# Patient Record
Sex: Male | Born: 1965 | Race: Black or African American | Hispanic: No | Marital: Single | Smoking: Current every day smoker
Health system: Southern US, Community
[De-identification: ages and names within clinical notes are randomized; demographics above are authoritative.]

## PROBLEM LIST (undated history)

## (undated) DIAGNOSIS — D649 Anemia, unspecified: Secondary | ICD-10-CM

## (undated) DIAGNOSIS — J942 Hemothorax: Secondary | ICD-10-CM

## (undated) DIAGNOSIS — K8020 Calculus of gallbladder without cholecystitis without obstruction: Secondary | ICD-10-CM

## (undated) DIAGNOSIS — IMO0002 Reserved for concepts with insufficient information to code with codable children: Secondary | ICD-10-CM

---

## 1998-02-26 ENCOUNTER — Emergency Department (HOSPITAL_COMMUNITY): Admission: EM | Admit: 1998-02-26 | Discharge: 1998-02-26 | Payer: Self-pay | Admitting: Emergency Medicine

## 2003-01-15 ENCOUNTER — Emergency Department (HOSPITAL_COMMUNITY): Admission: EM | Admit: 2003-01-15 | Discharge: 2003-01-15 | Payer: Self-pay | Admitting: Emergency Medicine

## 2004-06-19 ENCOUNTER — Emergency Department (HOSPITAL_COMMUNITY): Admission: EM | Admit: 2004-06-19 | Discharge: 2004-06-19 | Payer: Self-pay | Admitting: Emergency Medicine

## 2005-10-22 ENCOUNTER — Emergency Department (HOSPITAL_COMMUNITY): Admission: EM | Admit: 2005-10-22 | Discharge: 2005-10-22 | Payer: Self-pay | Admitting: Emergency Medicine

## 2006-04-14 ENCOUNTER — Emergency Department (HOSPITAL_COMMUNITY): Admission: EM | Admit: 2006-04-14 | Discharge: 2006-04-14 | Payer: Self-pay | Admitting: Emergency Medicine

## 2006-09-30 ENCOUNTER — Emergency Department (HOSPITAL_COMMUNITY): Admission: EM | Admit: 2006-09-30 | Discharge: 2006-09-30 | Payer: Self-pay | Admitting: Emergency Medicine

## 2007-05-08 ENCOUNTER — Emergency Department (HOSPITAL_COMMUNITY): Admission: EM | Admit: 2007-05-08 | Discharge: 2007-05-09 | Payer: Self-pay | Admitting: Emergency Medicine

## 2007-05-29 ENCOUNTER — Emergency Department (HOSPITAL_COMMUNITY): Admission: EM | Admit: 2007-05-29 | Discharge: 2007-05-29 | Payer: Self-pay | Admitting: Emergency Medicine

## 2008-05-19 ENCOUNTER — Emergency Department (HOSPITAL_COMMUNITY): Admission: EM | Admit: 2008-05-19 | Discharge: 2008-05-19 | Payer: Self-pay | Admitting: Emergency Medicine

## 2008-11-14 ENCOUNTER — Emergency Department (HOSPITAL_COMMUNITY): Admission: EM | Admit: 2008-11-14 | Discharge: 2008-11-14 | Payer: Self-pay | Admitting: Emergency Medicine

## 2008-11-16 ENCOUNTER — Ambulatory Visit (HOSPITAL_COMMUNITY): Admission: RE | Admit: 2008-11-16 | Discharge: 2008-11-16 | Payer: Self-pay | Admitting: Emergency Medicine

## 2009-06-20 ENCOUNTER — Emergency Department (HOSPITAL_COMMUNITY): Admission: EM | Admit: 2009-06-20 | Discharge: 2009-06-20 | Payer: Self-pay | Admitting: Emergency Medicine

## 2010-06-16 LAB — DIFFERENTIAL
Eosinophils Absolute: 0 10*3/uL (ref 0.0–0.7)
Eosinophils Relative: 1 % (ref 0–5)
Lymphocytes Relative: 18 % (ref 12–46)
Lymphs Abs: 1.4 10*3/uL (ref 0.7–4.0)
Monocytes Relative: 5 % (ref 3–12)
Neutrophils Relative %: 75 % (ref 43–77)

## 2010-06-16 LAB — COMPREHENSIVE METABOLIC PANEL
ALT: 8 U/L (ref 0–53)
AST: 17 U/L (ref 0–37)
Calcium: 9.6 mg/dL (ref 8.4–10.5)
Creatinine, Ser: 1.02 mg/dL (ref 0.4–1.5)
GFR calc Af Amer: >60 mL/min (ref >60)
Sodium: 137 mEq/L (ref 135–145)
Total Protein: 7.1 g/dL (ref 6.0–8.3)

## 2010-06-16 LAB — URINE MICROSCOPIC-ADD ON

## 2010-06-16 LAB — URINALYSIS, ROUTINE W REFLEX MICROSCOPIC
Bilirubin Urine: NEGATIVE
Ketones, ur: NEGATIVE mg/dL
Nitrite: NEGATIVE
Urobilinogen, UA: 1 mg/dL (ref 0.0–1.0)
pH: 5.5 (ref 5.0–8.0)

## 2010-06-16 LAB — CBC
MCHC: 32 g/dL (ref 30.0–36.0)
RDW: 12.2 % (ref 11.5–15.5)

## 2010-06-16 LAB — POCT CARDIAC MARKERS
CKMB, poc: <1 ng/mL — ABNORMAL LOW (ref 1.0–8.0)
Troponin i, poc: <0.05 ng/mL (ref 0.00–0.09)

## 2010-06-29 LAB — DIFFERENTIAL
Basophils Absolute: 0 10*3/uL (ref 0.0–0.1)
Eosinophils Relative: 0 % (ref 0–5)
Lymphocytes Relative: 19 % (ref 12–46)
Lymphs Abs: 1.4 10*3/uL (ref 0.7–4.0)
Neutro Abs: 5.4 10*3/uL (ref 1.7–7.7)
Neutrophils Relative %: 73 % (ref 43–77)

## 2010-06-29 LAB — CBC
HCT: 44.6 % (ref 39.0–52.0)
Hemoglobin: 14.5 g/dL (ref 13.0–17.0)
MCHC: 32.6 g/dL (ref 30.0–36.0)
MCV: 81.3 fL (ref 78.0–100.0)
RBC: 5.49 MIL/uL (ref 4.22–5.81)
WBC: 7.3 10*3/uL (ref 4.0–10.5)

## 2010-06-29 LAB — COMPREHENSIVE METABOLIC PANEL
AST: 18 U/L (ref 0–37)
BUN: 17 mg/dL (ref 6–23)
CO2: 30 mEq/L (ref 19–32)
Calcium: 9.8 mg/dL (ref 8.4–10.5)
Chloride: 101 mEq/L (ref 96–112)
Creatinine, Ser: 1.16 mg/dL (ref 0.4–1.5)
GFR calc Af Amer: >60 mL/min (ref >60)
GFR calc non Af Amer: >60 mL/min (ref >60)
Glucose, Bld: 85 mg/dL (ref 70–99)
Total Bilirubin: 0.6 mg/dL (ref 0.3–1.2)

## 2010-06-29 LAB — LIPASE, BLOOD: Lipase: 19 U/L (ref 11–59)

## 2010-07-09 LAB — CBC
HCT: 38 % — ABNORMAL LOW (ref 39.0–52.0)
Hemoglobin: 12.4 g/dL — ABNORMAL LOW (ref 13.0–17.0)
Platelets: 206 10*3/uL (ref 150–400)
RDW: 13.4 % (ref 11.5–15.5)
WBC: 8.1 10*3/uL (ref 4.0–10.5)

## 2010-07-09 LAB — COMPREHENSIVE METABOLIC PANEL
ALT: 8 U/L (ref 0–53)
Albumin: 3.6 g/dL (ref 3.5–5.2)
Alkaline Phosphatase: 79 U/L (ref 39–117)
Chloride: 104 mEq/L (ref 96–112)
Glucose, Bld: 101 mg/dL — ABNORMAL HIGH (ref 70–99)
Potassium: 4.1 mEq/L (ref 3.5–5.1)
Sodium: 137 mEq/L (ref 135–145)
Total Bilirubin: 0.4 mg/dL (ref 0.3–1.2)
Total Protein: 6.3 g/dL (ref 6.0–8.3)

## 2010-12-16 LAB — CBC
HCT: 45.8
Hemoglobin: 14.9
MCHC: 32.4
Platelets: 248
RDW: 13.3

## 2010-12-16 LAB — COMPREHENSIVE METABOLIC PANEL
BUN: 14
Calcium: 9.6
Creatinine, Ser: 1.15
Glucose, Bld: 84
Total Protein: 7.9

## 2010-12-16 LAB — DIFFERENTIAL
Basophils Relative: 0
Lymphocytes Relative: 19
Lymphs Abs: 1.3
Monocytes Relative: 6
Neutro Abs: 5.1
Neutrophils Relative %: 74

## 2011-01-07 LAB — I-STAT 8, (EC8 V) (CONVERTED LAB)
Bicarbonate: 26.2 — ABNORMAL HIGH
HCT: 42
Hemoglobin: 14.3
Operator id: 189501
TCO2: 28
pCO2, Ven: 47.7

## 2011-01-07 LAB — POCT CARDIAC MARKERS
CKMB, poc: <1 — ABNORMAL LOW
Troponin i, poc: <0.05

## 2011-01-07 LAB — DIFFERENTIAL
Lymphocytes Relative: 33
Monocytes Absolute: 0.4
Monocytes Relative: 7
Neutro Abs: 3.6

## 2011-01-07 LAB — CBC
HCT: 37.5 — ABNORMAL LOW
Hemoglobin: 12.1 — ABNORMAL LOW
MCV: 80.2
RBC: 4.68
WBC: 6.2

## 2011-01-07 LAB — POCT I-STAT CREATININE
Creatinine, Ser: 1
Operator id: 189501

## 2011-02-04 ENCOUNTER — Emergency Department (HOSPITAL_COMMUNITY)
Admission: EM | Admit: 2011-02-04 | Discharge: 2011-02-04 | Disposition: A | Discharge status: Home / Self Care | Payer: Self-pay | Attending: Emergency Medicine | Admitting: Emergency Medicine

## 2011-02-04 ENCOUNTER — Emergency Department (HOSPITAL_COMMUNITY): Payer: Self-pay

## 2011-02-04 DIAGNOSIS — F17200 Nicotine dependence, unspecified, uncomplicated: Secondary | ICD-10-CM | POA: Insufficient documentation

## 2011-02-04 DIAGNOSIS — K2960 Other gastritis without bleeding: Secondary | ICD-10-CM | POA: Insufficient documentation

## 2011-02-04 DIAGNOSIS — R1013 Epigastric pain: Secondary | ICD-10-CM | POA: Insufficient documentation

## 2011-02-04 DIAGNOSIS — T3995XA Adverse effect of unspecified nonopioid analgesic, antipyretic and antirheumatic, initial encounter: Secondary | ICD-10-CM | POA: Insufficient documentation

## 2011-02-04 HISTORY — DX: Hemothorax: J94.2

## 2011-02-04 HISTORY — DX: Calculus of gallbladder without cholecystitis without obstruction: K80.20

## 2011-02-04 HISTORY — DX: Reserved for concepts with insufficient information to code with codable children: IMO0002

## 2011-02-04 LAB — COMPREHENSIVE METABOLIC PANEL
Albumin: 3.3 g/dL — ABNORMAL LOW (ref 3.5–5.2)
Alkaline Phosphatase: 93 U/L (ref 39–117)
BUN: 12 mg/dL (ref 6–23)
Potassium: 3.9 mEq/L (ref 3.5–5.1)
Total Protein: 6.6 g/dL (ref 6.0–8.3)

## 2011-02-04 LAB — CBC
Hemoglobin: 11.6 g/dL — ABNORMAL LOW (ref 13.0–17.0)
MCH: 25.4 pg — ABNORMAL LOW (ref 26.0–34.0)
MCHC: 32.9 g/dL (ref 30.0–36.0)
Platelets: 227 10*3/uL (ref 150–400)
RDW: 12.7 % (ref 11.5–15.5)

## 2011-02-04 LAB — DIFFERENTIAL
Basophils Relative: 0 % (ref 0–1)
Eosinophils Absolute: 0 10*3/uL (ref 0.0–0.7)
Monocytes Relative: 6 % (ref 3–12)
Neutrophils Relative %: 65 % (ref 43–77)

## 2011-02-04 LAB — LIPASE, BLOOD: Lipase: 23 U/L (ref 11–59)

## 2011-02-04 MED ORDER — SODIUM CHLORIDE 0.9 % IV BOLUS (SEPSIS)
500.0000 mL | Freq: Once | INTRAVENOUS | Status: AC
  Administered 2011-02-04: 500 mL via INTRAVENOUS

## 2011-02-04 MED ORDER — ONDANSETRON HCL 4 MG/2ML IJ SOLN
INTRAMUSCULAR | Status: AC
  Filled 2011-02-04: qty 2

## 2011-02-04 MED ORDER — ONDANSETRON HCL 4 MG/2ML IJ SOLN
4.0000 mg | Freq: Once | INTRAMUSCULAR | Status: AC
  Administered 2011-02-04: 4 mg via INTRAVENOUS

## 2011-02-04 MED ORDER — FENTANYL CITRATE 0.05 MG/ML IJ SOLN
50.0000 ug | Freq: Once | INTRAMUSCULAR | Status: AC
  Administered 2011-02-04: 50 ug via INTRAVENOUS
  Filled 2011-02-04: qty 2

## 2011-02-04 NOTE — ED Notes (Signed)
Pt c/o abd pain that began two weeks ago

## 2011-02-04 NOTE — ED Provider Notes (Signed)
Ultrasound is negative for gall stones. The patient has been taking ibuprofen regularly and pain seems to be localized to epigastric area, suggesting gastris. Will put patient on Prilosec and refer to primary care provider for further management.  Rodena Medin, PA 02/04/11 [phone]

## 2011-02-04 NOTE — ED Provider Notes (Signed)
History     CSN: [id number] Arrival date & time: 02/04/2011  1:14 PM   First MD Initiated Contact with Patient 02/04/11 1316      Chief Complaint  Patient presents with  . Abdominal Pain    (Consider location/radiation/quality/duration/timing/severity/associated sxs/prior treatment) HPI  Patient states he has a history of being told in the past and has gallstones but has not followed up with a general surgeon and he does not have a primary care doctor denying any known medical problems presents to the emergency department complaining of a two-week history of abdominal pain. Patient states the abdominal pain is in the upper epigastric region and it is greatest in the evening time after eating with pain responsive to taking ibuprofen. Patient states he takes "a lot of ibuprofen." Patient states sometimes he is able to go to sleep but then the pain will wake him from sleeping. Patient states he has pain most days out of the week with the same ongoing pain for 2 weeks. As fevers, chills, nausea, vomiting, diarrhea, chest pain, shortness of breath. Pain is improved with ibuprofen use he aggravated by eating. However patient denies any changes in his appetite stating he is eating and drinking without difficulty. Denies blood in his stool.  Past Medical History  Diagnosis Date  . Ulcer   . Gallstones   . Hemothorax     History reviewed. No pertinent past surgical history.  History reviewed. No pertinent family history.  History  Substance Use Topics  . Smoking status: Current Everyday Smoker  . Smokeless tobacco: Not on file  . Alcohol Use: No      Review of Systems  All other systems reviewed and are negative.    Allergies  Review of patient's allergies indicates no known allergies.  Home Medications   Current Outpatient Rx  Name Route Sig Dispense Refill  . ACETAMINOPHEN 500 MG PO TABS Oral Take 1,000 mg by mouth every 6 (six) hours as needed. Pain/ fever     .  IBUPROFEN 200 MG PO TABS Oral Take 800 mg by mouth every 6 (six) hours as needed. Pain/fever        BP 119/77  Pulse 61  Temp(Src) 98.7 F (37.1 C) (Oral)  Resp 17  SpO2 98%  Physical Exam  Nursing note and vitals reviewed. Constitutional: He is oriented to person, place, and time. He appears well-developed and well-nourished. No distress.  HENT:  Head: Normocephalic and atraumatic.  Eyes: Conjunctivae are normal.  Neck: Normal range of motion. Neck supple.  Cardiovascular: Normal rate, regular rhythm, normal heart sounds and intact distal pulses.  Exam reveals no gallop and no friction rub.   No murmur heard. Pulmonary/Chest: Effort normal and breath sounds normal. No respiratory distress. He has no wheezes. He has no rales. He exhibits no tenderness.  Abdominal: Soft. Normal appearance, normal aorta and bowel sounds are normal. He exhibits no distension and no mass. There is tenderness in the epigastric area. There is no rebound, no guarding and no CVA tenderness.  Musculoskeletal: Normal range of motion. He exhibits no edema and no tenderness.  Neurological: He is alert and oriented to person, place, and time.  Skin: Skin is warm and dry. No rash noted. He is not diaphoretic. No erythema.  Psychiatric: He has a normal mood and affect.    ED Course  Procedures (including critical care time)  IV fentanyl and zofran   Labs Reviewed  CBC - Abnormal; Notable for the following:  Hemoglobin 11.6 (*)    HCT 35.3 (*)    MCV 77.2 (*)    MCH 25.4 (*)    All other components within normal limits  COMPREHENSIVE METABOLIC PANEL - Abnormal; Notable for the following:    Albumin 3.3 (*)    All other components within normal limits  DIFFERENTIAL  LIPASE, BLOOD   No results found.   No diagnosis found.    MDM  Sign out given to [facility] for pending abdominal US with patient non toxic appearing, afebrile, and no peritoneal signs. If [place] is negative then patient to be  educated on decreasing his ibuprofen use and started on a PPI with PCP follow up.         Jenness Corner, [place] 02/04/11 1523

## 2011-02-04 NOTE — ED Notes (Signed)
Pt has arrived in cdu. He has been flagged for discharge home by pa.

## 2011-02-04 NOTE — ED Provider Notes (Signed)
Medical screening examination/treatment/procedure(s) were performed by non-physician practitioner and as supervising physician I was immediately available for consultation/collaboration.    Nelia Shi, MD 02/04/11 [phone]

## 2011-02-05 NOTE — ED Provider Notes (Signed)
Evaluation and management procedures were performed by the PA/NP under my supervision/collaboration.    , MD 02/05/11 0032 

## 2011-07-15 ENCOUNTER — Encounter (HOSPITAL_COMMUNITY): Payer: Self-pay | Admitting: Emergency Medicine

## 2011-07-15 ENCOUNTER — Emergency Department (HOSPITAL_COMMUNITY)
Admission: EM | Admit: 2011-07-15 | Discharge: 2011-07-16 | Disposition: A | Discharge status: Home / Self Care | Payer: Self-pay | Attending: Emergency Medicine | Admitting: Emergency Medicine

## 2011-07-15 DIAGNOSIS — K921 Melena: Secondary | ICD-10-CM | POA: Insufficient documentation

## 2011-07-15 DIAGNOSIS — R748 Abnormal levels of other serum enzymes: Secondary | ICD-10-CM | POA: Insufficient documentation

## 2011-07-15 DIAGNOSIS — R109 Unspecified abdominal pain: Secondary | ICD-10-CM | POA: Insufficient documentation

## 2011-07-15 LAB — COMPREHENSIVE METABOLIC PANEL
Alkaline Phosphatase: 94 U/L (ref 39–117)
BUN: 16 mg/dL (ref 6–23)
CO2: 24 mEq/L (ref 19–32)
Chloride: 97 mEq/L (ref 96–112)
GFR calc Af Amer: >90 mL/min (ref >90)
Glucose, Bld: 87 mg/dL (ref 70–99)
Potassium: 3.7 mEq/L (ref 3.5–5.1)
Total Bilirubin: 0.5 mg/dL (ref 0.3–1.2)

## 2011-07-15 LAB — LIPASE, BLOOD: Lipase: 66 U/L — ABNORMAL HIGH (ref 11–59)

## 2011-07-15 LAB — CBC
HCT: 37.8 % — ABNORMAL LOW (ref 39.0–52.0)
Hemoglobin: 12.3 g/dL — ABNORMAL LOW (ref 13.0–17.0)
MCHC: 32.5 g/dL (ref 30.0–36.0)
WBC: 7.6 10*3/uL (ref 4.0–10.5)

## 2011-07-15 MED ORDER — ONDANSETRON HCL 4 MG/2ML IJ SOLN
4.0000 mg | Freq: Once | INTRAMUSCULAR | Status: AC
  Administered 2011-07-15: 4 mg via INTRAVENOUS
  Filled 2011-07-15: qty 2

## 2011-07-15 MED ORDER — MORPHINE SULFATE 4 MG/ML IJ SOLN
4.0000 mg | Freq: Once | INTRAMUSCULAR | Status: AC
  Administered 2011-07-15: 4 mg via INTRAVENOUS
  Filled 2011-07-15: qty 1

## 2011-07-15 MED ORDER — PANTOPRAZOLE SODIUM 40 MG IV SOLR
40.0000 mg | Freq: Once | INTRAVENOUS | Status: AC
  Administered 2011-07-15: 40 mg via INTRAVENOUS
  Filled 2011-07-15: qty 40

## 2011-07-15 MED ORDER — SODIUM CHLORIDE 0.9 % IV SOLN
Freq: Once | INTRAVENOUS | Status: AC
  Administered 2011-07-15: 23:00:00 via INTRAVENOUS

## 2011-07-15 NOTE — ED Provider Notes (Signed)
History     CSN: [id number]  Arrival date & time 07/15/11  2056   First MD Initiated Contact with Patient 07/15/11 2231      Chief Complaint  Patient presents with  . Abdominal Pain    (Consider location/radiation/quality/duration/timing/severity/associated sxs/prior treatment) Patient is a 46 y.o. male presenting with abdominal pain. The history is provided by the patient.  Abdominal Pain The primary symptoms of the illness include abdominal pain. The primary symptoms of the illness do not include fever, shortness of breath, nausea, vomiting, diarrhea, hematemesis, hematochezia or dysuria. The current episode started 13 to 24 hours ago. The onset of the illness was gradual.  The patient states that she believes she is currently not pregnant. The patient has not had a change in bowel habit. Symptoms associated with the illness do not include constipation or hematuria. Significant associated medical issues include PUD and gallstones.    Past Medical History  Diagnosis Date  . Ulcer   . Gallstones   . Hemothorax     History reviewed. No pertinent past surgical history.  Family History  Problem Relation Age of Onset  . Diabetes Mother   . Hypertension Other   . Diabetes Other     History  Substance Use Topics  . Smoking status: Current Everyday Smoker    Types: Cigarettes  . Smokeless tobacco: Not on file  . Alcohol Use: No      Review of Systems  Constitutional: Negative for fever.  Respiratory: Negative for cough and shortness of breath.   Gastrointestinal: Positive for abdominal pain and blood in stool. Negative for nausea, vomiting, diarrhea, constipation, hematochezia and hematemesis.  Genitourinary: Negative for dysuria and hematuria.    Allergies  Review of patient's allergies indicates no known allergies.  Home Medications   Current Outpatient Rx  Name Route Sig Dispense Refill  . IBUPROFEN 200 MG PO TABS Oral Take 400 mg by mouth every 6 (six) hours  as needed. Stomach Pain      BP 140/69  Pulse 64  Temp(Src) 98 F (36.7 C) (Oral)  Resp 20  SpO2 100%  Physical Exam  Constitutional: He is oriented to person, place, and time. He appears well-developed and well-nourished.  HENT:  Head: Normocephalic.  Eyes: Pupils are equal, round, and reactive to light.  Neck: Normal range of motion.  Cardiovascular: Normal rate.   Pulmonary/Chest: Effort normal.  Abdominal: Bowel sounds are normal. He exhibits no distension. There is no hepatosplenomegaly. There is tenderness in the right upper quadrant, epigastric area and left upper quadrant.  Musculoskeletal: Normal range of motion.  Neurological: He is alert and oriented to person, place, and time.  Skin: Skin is warm and dry.    ED Course  Procedures (including critical care time)   Labs Reviewed  CBC  COMPREHENSIVE METABOLIC PANEL  LIPASE, BLOOD   No results found.   No diagnosis found.    MDM  Patient with chronic recurrent epigastric pain has been sleeping since he got one IV dose of morphine.  Review of his labs reveals a slightly elevated lipase at 66 with normal LFTs.  We'll try a by mouth challenge if he tolerates, this we'll discharge home        Arman Filter, NP 07/16/11 [phone]

## 2011-07-15 NOTE — ED Notes (Signed)
PA Schulz at bedside. 

## 2011-07-15 NOTE — ED Notes (Signed)
Patient brought in by EMS - patient states that his ulcers have "Flaired up" from spaghetti that he ate earlier this week.

## 2011-07-15 NOTE — ED Notes (Signed)
Pt states he has hx of ulcers but has been doing ok until last night when he started having pain in his upper abdomen  Pt states he has nausea without vomiting  Denies diarrhea  Pt states yesterday he had bright red blood in his stool

## 2011-07-16 NOTE — Discharge Instructions (Signed)
Abdominal Pain (Nonspecific) Your exam might not show the exact reason you have abdominal pain. Since there are many different causes of abdominal pain, another checkup and more tests may be needed. It is very important to follow up for lasting (persistent) or worsening symptoms. A possible cause of abdominal pain in any person who still has his or her appendix is acute appendicitis. Appendicitis is often hard to diagnose. Normal blood tests, urine tests, ultrasound, and CT scans do not completely rule out early appendicitis or other causes of abdominal pain. Sometimes, only the changes that happen over time will allow appendicitis and other causes of abdominal pain to be determined. Other potential problems that may require surgery may also take time to become more apparent. Because of this, it is important that you follow all of the instructions below. HOME CARE INSTRUCTIONS   Rest as much as possible.   Do not eat solid food until your pain is gone.   While adults or children have pain: A diet of water, weak decaffeinated tea, broth or bouillon, gelatin, oral rehydration solutions (ORS), frozen ice pops, or ice chips may be helpful.   When pain is gone in adults or children: Start a light diet (dry toast, crackers, applesauce, or white rice). Increase the diet slowly as long as it does not bother you. Eat no dairy products (including cheese and eggs) and no spicy, fatty, fried, or high-fiber foods.   Use no alcohol, caffeine, or cigarettes.   Take your regular medicines unless your caregiver told you not to.   Take any prescribed medicine as directed.   Only take over-the-counter or prescription medicines for pain, discomfort, or fever as directed by your caregiver. Do not give aspirin to children.  If your caregiver has given you a follow-up appointment, it is very important to keep that appointment. Not keeping the appointment could result in a permanent injury and/or lasting (chronic) pain  and/or disability. If there is any problem keeping the appointment, you must call to reschedule.  SEEK IMMEDIATE MEDICAL CARE IF:   Your pain is not gone in 24 hours.   Your pain becomes worse, changes location, or feels different.   You or your child has an oral temperature above 102 F (38.9 C), not controlled by medicine.   Your baby is older than 3 months with a rectal temperature of 102 F (38.9 C) or higher.   Your baby is 53 months old or younger with a rectal temperature of 100.4 F (38 C) or higher.   You have shaking chills.   You keep throwing up (vomiting) or cannot drink liquids.   There is blood in your vomit or you see blood in your bowel movements.   Your bowel movements become dark or black.   You have frequent bowel movements.   Your bowel movements stop (become blocked) or you cannot pass gas.   You have bloody, frequent, or painful urination.   You have yellow discoloration in the skin or whites of the eyes.   Your stomach becomes bloated or bigger.   You have dizziness or fainting.   You have chest or back pain.  MAKE SURE YOU:   Understand these instructions.   Will watch your condition.   Will get help right away if you are not doing well or get worse.  Document Released: 03/10/2005 Document Revised: 02/27/2011 Document Reviewed: 02/05/2009 [facility] Patient Information 2012 [place], [place]. The only abnormal lab finding.  Tonight was a slightly elevated lipase indicating  a slight inflammation of your pancreas.  At this point, you do not need hospitalization, you were able to tolerate fluids by mouth.  Please try to follow a clear liquid diet for the next 24 hours.  Return here if increased pain, nausea, vomiting, fever

## 2011-07-16 NOTE — ED Notes (Signed)
Correction, RN Chere aware of hemmoccult card at bedside.

## 2011-07-16 NOTE — ED Notes (Signed)
Hemmoccult card at bedside. RN Elnita Maxwell aware.

## 2011-07-16 NOTE — ED Provider Notes (Signed)
Medical screening examination/treatment/procedure(s) were performed by non-physician practitioner and as supervising physician I was immediately available for consultation/collaboration.  Gerhard Munch, MD 07/16/11 2230

## 2011-12-27 ENCOUNTER — Emergency Department (HOSPITAL_COMMUNITY): Payer: Self-pay

## 2011-12-27 ENCOUNTER — Encounter (HOSPITAL_COMMUNITY): Payer: Self-pay | Admitting: Emergency Medicine

## 2011-12-27 ENCOUNTER — Emergency Department (HOSPITAL_COMMUNITY)
Admission: EM | Admit: 2011-12-27 | Discharge: 2011-12-27 | Disposition: A | Discharge status: Home / Self Care | Payer: Self-pay | Attending: Emergency Medicine | Admitting: Emergency Medicine

## 2011-12-27 DIAGNOSIS — F17200 Nicotine dependence, unspecified, uncomplicated: Secondary | ICD-10-CM | POA: Insufficient documentation

## 2011-12-27 DIAGNOSIS — S6290XA Unspecified fracture of unspecified wrist and hand, initial encounter for closed fracture: Secondary | ICD-10-CM

## 2011-12-27 DIAGNOSIS — D649 Anemia, unspecified: Secondary | ICD-10-CM | POA: Insufficient documentation

## 2011-12-27 DIAGNOSIS — S62319A Displaced fracture of base of unspecified metacarpal bone, initial encounter for closed fracture: Secondary | ICD-10-CM | POA: Insufficient documentation

## 2011-12-27 DIAGNOSIS — IMO0002 Reserved for concepts with insufficient information to code with codable children: Secondary | ICD-10-CM | POA: Insufficient documentation

## 2011-12-27 HISTORY — DX: Anemia, unspecified: D64.9

## 2011-12-27 MED ORDER — HYDROCODONE-ACETAMINOPHEN 5-325 MG PO TABS
1.0000 | ORAL_TABLET | Freq: Once | ORAL | Status: AC
  Administered 2011-12-27: 1 via ORAL
  Filled 2011-12-27: qty 1

## 2011-12-27 MED ORDER — HYDROCODONE-ACETAMINOPHEN 5-500 MG PO TABS: 1.0000 | ORAL_TABLET | Freq: Four times a day (QID) | ORAL | Status: DC | PRN

## 2011-12-27 NOTE — Progress Notes (Signed)
Orthopedic Tech Progress Note Patient Details:  Marvin Thomas 08/12/1965 [id number]  Ortho Devices Type of Ortho Device: Ulna gutter splint   Haskell Flirt 12/27/2011, 4:22 AM

## 2011-12-27 NOTE — ED Notes (Signed)
NP at bedside. Pt resting.
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
## 2011-12-27 NOTE — ED Notes (Signed)
Ortho paged for splint 
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
## 2011-12-28 NOTE — ED Provider Notes (Signed)
Medical screening examination/treatment/procedure(s) were performed by non-physician practitioner and as supervising physician I was immediately available for consultation/collaboration.    Vida Roller, MD 12/28/11 [phone]

## 2012-01-05 ENCOUNTER — Encounter (HOSPITAL_COMMUNITY): Payer: Self-pay | Admitting: Pharmacy Technician

## 2012-01-06 ENCOUNTER — Encounter (HOSPITAL_COMMUNITY): Payer: Self-pay | Admitting: *Deleted

## 2012-01-06 NOTE — Progress Notes (Signed)
Spoke to Clydie Braun in Dr. Glenna Durand office, requested orders.

## 2012-01-07 ENCOUNTER — Encounter (HOSPITAL_COMMUNITY): Payer: Self-pay | Admitting: *Deleted

## 2012-01-07 ENCOUNTER — Encounter (HOSPITAL_COMMUNITY): Payer: Self-pay | Admitting: Certified Registered"

## 2012-01-07 ENCOUNTER — Ambulatory Visit (HOSPITAL_COMMUNITY)
Admission: RE | Admit: 2012-01-07 | Discharge: 2012-01-07 | Disposition: A | Discharge status: Home / Self Care | Payer: Self-pay | Source: Ambulatory Visit | Attending: Orthopedic Surgery | Admitting: Orthopedic Surgery

## 2012-01-07 DIAGNOSIS — R109 Unspecified abdominal pain: Secondary | ICD-10-CM | POA: Insufficient documentation

## 2012-01-07 DIAGNOSIS — Z538 Procedure and treatment not carried out for other reasons: Secondary | ICD-10-CM | POA: Insufficient documentation

## 2012-01-07 DIAGNOSIS — S62309A Unspecified fracture of unspecified metacarpal bone, initial encounter for closed fracture: Secondary | ICD-10-CM | POA: Insufficient documentation

## 2012-01-07 DIAGNOSIS — X58XXXA Exposure to other specified factors, initial encounter: Secondary | ICD-10-CM | POA: Insufficient documentation

## 2012-01-07 LAB — COMPREHENSIVE METABOLIC PANEL
ALT: 7 U/L (ref 0–53)
AST: 16 U/L (ref 0–37)
Albumin: 3.9 g/dL (ref 3.5–5.2)
Alkaline Phosphatase: 87 U/L (ref 39–117)
Glucose, Bld: 76 mg/dL (ref 70–99)
Potassium: 3.3 mEq/L — ABNORMAL LOW (ref 3.5–5.1)
Sodium: 140 mEq/L (ref 135–145)
Total Protein: 7.1 g/dL (ref 6.0–8.3)

## 2012-01-07 LAB — CBC
Hemoglobin: 12.6 g/dL — ABNORMAL LOW (ref 13.0–17.0)
MCHC: 33.2 g/dL (ref 30.0–36.0)
RDW: 12.9 % (ref 11.5–15.5)

## 2012-01-07 MED ORDER — CEFAZOLIN SODIUM-DEXTROSE 2-3 GM-% IV SOLR: 2.0000 g | INTRAVENOUS | Status: DC

## 2012-01-07 MED ORDER — MUPIROCIN 2 % EX OINT
TOPICAL_OINTMENT | Freq: Two times a day (BID) | CUTANEOUS | Status: DC
  Administered 2012-01-07: 1 via NASAL
  Filled 2012-01-07: qty 22

## 2012-01-07 MED ORDER — CHLORHEXIDINE GLUCONATE 4 % EX LIQD: 60.0000 mL | Freq: Once | CUTANEOUS | Status: DC
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
## 2012-01-07 NOTE — Progress Notes (Signed)
CLIENT C/O 10/10 "STOMACH PAIN" STATES IT IS HIS ULCERS; DR Noreene Larsson NOTIFIED AND PER DR Noreene Larsson ADVISED CLIENT THEY WILL GIVE HIM SOMETHING FOR THAT PAIN IN HIS IV

## 2012-01-07 NOTE — Progress Notes (Signed)
C/O STOMACH PAIN 10/10 STATES IT IS "MY ULCERS"

## 2012-01-07 NOTE — Progress Notes (Signed)
Unable to sign consent due to there being no orders in epic from Dr. Orlan Leavens, and the fact that Dr. Orlan Leavens has advised [place] not to call him about obtaining orders.

## 2012-01-08 ENCOUNTER — Encounter (HOSPITAL_COMMUNITY): Payer: Self-pay | Admitting: Anesthesiology

## 2012-01-08 ENCOUNTER — Encounter (HOSPITAL_COMMUNITY)
Admission: RE | Disposition: A | Discharge status: Home / Self Care | Payer: Self-pay | Source: Ambulatory Visit | Attending: Orthopedic Surgery

## 2012-01-08 SURGERY — CANCELLED PROCEDURE
Laterality: Right

## 2012-01-08 SURGICAL SUPPLY — 46 items
BANDAGE ELASTIC 3 VELCRO ST LF (GAUZE/BANDAGES/DRESSINGS) ×2 IMPLANT
BANDAGE ELASTIC 4 VELCRO ST LF (GAUZE/BANDAGES/DRESSINGS) ×2 IMPLANT
BANDAGE GAUZE ELAST BULKY 4 IN (GAUZE/BANDAGES/DRESSINGS) ×2 IMPLANT
BLADE SURG ROTATE 9660 (MISCELLANEOUS) IMPLANT
BNDG ESMARK 4X9 LF (GAUZE/BANDAGES/DRESSINGS) ×2 IMPLANT
CLOTH BEACON ORANGE TIMEOUT ST (SAFETY) ×2 IMPLANT
CORDS BIPOLAR (ELECTRODE) ×2 IMPLANT
COVER SURGICAL LIGHT HANDLE (MISCELLANEOUS) ×2 IMPLANT
CUFF TOURNIQUET SINGLE 18IN (TOURNIQUET CUFF) ×2 IMPLANT
CUFF TOURNIQUET SINGLE 24IN (TOURNIQUET CUFF) IMPLANT
DRAIN TLS ROUND 10FR (DRAIN) IMPLANT
DRAPE OEC MINIVIEW 54X84 (DRAPES) ×2 IMPLANT
DRAPE SURG 17X11 SM STRL (DRAPES) ×2 IMPLANT
DRSG ADAPTIC 3X8 NADH LF (GAUZE/BANDAGES/DRESSINGS) ×2 IMPLANT
ELECT REM PT RETURN 9FT ADLT (ELECTROSURGICAL)
ELECTRODE REM PT RTRN 9FT ADLT (ELECTROSURGICAL) IMPLANT
GAUZE SPONGE 4X4 16PLY XRAY LF (GAUZE/BANDAGES/DRESSINGS) ×2 IMPLANT
GLOVE BIOGEL PI IND STRL 8.5 (GLOVE) ×1 IMPLANT
GLOVE BIOGEL PI INDICATOR 8.5 (GLOVE) ×1
GLOVE SURG ORTHO 8.0 STRL STRW (GLOVE) ×2 IMPLANT
GOWN PREVENTION PLUS XLARGE (GOWN DISPOSABLE) ×2 IMPLANT
GOWN STRL NON-REIN LRG LVL3 (GOWN DISPOSABLE) ×6 IMPLANT
KIT BASIN OR (CUSTOM PROCEDURE TRAY) ×2 IMPLANT
KIT ROOM TURNOVER OR (KITS) ×2 IMPLANT
MANIFOLD NEPTUNE II (INSTRUMENTS) ×2 IMPLANT
NEEDLE HYPO 25X1 1.5 SAFETY (NEEDLE) ×2 IMPLANT
NS IRRIG 1000ML POUR BTL (IV SOLUTION) ×2 IMPLANT
PACK ORTHO EXTREMITY (CUSTOM PROCEDURE TRAY) ×2 IMPLANT
PAD ARMBOARD 7.5X6 YLW CONV (MISCELLANEOUS) ×4 IMPLANT
PAD CAST 4YDX4 CTTN HI CHSV (CAST SUPPLIES) ×1 IMPLANT
PADDING CAST COTTON 4X4 STRL (CAST SUPPLIES) ×1
SOAP 2 % CHG 4 OZ (WOUND CARE) ×2 IMPLANT
SPONGE GAUZE 4X4 12PLY (GAUZE/BANDAGES/DRESSINGS) ×2 IMPLANT
STRIP CLOSURE SKIN 1/2X4 (GAUZE/BANDAGES/DRESSINGS) IMPLANT
SUCTION FRAZIER TIP 10 FR DISP (SUCTIONS) ×2 IMPLANT
SUT ETHILON 4 0 PS 2 18 (SUTURE) ×2 IMPLANT
SUT MNCRL AB 4-0 PS2 18 (SUTURE) IMPLANT
SUT VIC AB 2-0 FS1 27 (SUTURE) ×2 IMPLANT
SUT VICRYL 4-0 PS2 18IN ABS (SUTURE) ×2 IMPLANT
SYR CONTROL 10ML LL (SYRINGE) IMPLANT
SYSTEM CHEST DRAIN TLS 7FR (DRAIN) IMPLANT
TOWEL OR 17X24 6PK STRL BLUE (TOWEL DISPOSABLE) ×2 IMPLANT
TOWEL OR 17X26 10 PK STRL BLUE (TOWEL DISPOSABLE) ×2 IMPLANT
TUBE CONNECTING 12X1/4 (SUCTIONS) ×2 IMPLANT
WATER STERILE IRR 1000ML POUR (IV SOLUTION) ×2 IMPLANT
YANKAUER SUCT BULB TIP NO VENT (SUCTIONS) IMPLANT

## 2012-01-08 NOTE — Preoperative (Signed)
Beta Blockers   Reason not to administer Beta Blockers:Not Applicable 

## 2012-02-07 ENCOUNTER — Encounter (HOSPITAL_COMMUNITY): Payer: Self-pay | Admitting: *Deleted

## 2012-02-07 ENCOUNTER — Emergency Department (HOSPITAL_COMMUNITY)
Admission: EM | Admit: 2012-02-07 | Discharge: 2012-02-08 | Disposition: A | Discharge status: Home / Self Care | Payer: Self-pay | Attending: Emergency Medicine | Admitting: Emergency Medicine

## 2012-02-07 DIAGNOSIS — Z8711 Personal history of peptic ulcer disease: Secondary | ICD-10-CM | POA: Insufficient documentation

## 2012-02-07 DIAGNOSIS — Z8719 Personal history of other diseases of the digestive system: Secondary | ICD-10-CM | POA: Insufficient documentation

## 2012-02-07 DIAGNOSIS — Z862 Personal history of diseases of the blood and blood-forming organs and certain disorders involving the immune mechanism: Secondary | ICD-10-CM | POA: Insufficient documentation

## 2012-02-07 DIAGNOSIS — K219 Gastro-esophageal reflux disease without esophagitis: Secondary | ICD-10-CM | POA: Insufficient documentation

## 2012-02-07 DIAGNOSIS — F17200 Nicotine dependence, unspecified, uncomplicated: Secondary | ICD-10-CM | POA: Insufficient documentation

## 2012-02-07 MED ORDER — GI COCKTAIL ~~LOC~~
30.0000 mL | Freq: Once | ORAL | Status: AC
  Administered 2012-02-07: 30 mL via ORAL
  Filled 2012-02-07: qty 30

## 2012-02-07 MED ORDER — PANTOPRAZOLE SODIUM 40 MG IV SOLR
40.0000 mg | Freq: Once | INTRAVENOUS | Status: AC
  Administered 2012-02-07: 40 mg via INTRAVENOUS
  Filled 2012-02-07: qty 40

## 2012-02-07 NOTE — ED Provider Notes (Signed)
History     CSN: [id number]  Arrival date & time 02/07/12  2155   First MD Initiated Contact with Patient 02/07/12 2328      Chief Complaint  Patient presents with  . Ulcer pain     (Consider location/radiation/quality/duration/timing/severity/associated sxs/prior treatment) HPI Is a 46 year old male who states he has a history of ulcer. This is never been endoscopically demonstrated. He has been having epigastric pain for the past 2 weeks worse at night. The pain is moderate to severe. It comes and goes. He denies alcohol consumption. It is only partially improved with ibuprofen. He has not taken any acids. There is no associated nausea, vomiting or diarrhea.  Past Medical History  Diagnosis Date  . Ulcer   . Gallstones   . Hemothorax   . Anemia     History reviewed. No pertinent past surgical history.  Family History  Problem Relation Age of Onset  . Diabetes Mother   . Hypertension Mother   . Diabetes Other     History  Substance Use Topics  . Smoking status: Current Every Day Smoker -- 0.5 packs/day    Types: Cigarettes  . Smokeless tobacco: Not on file  . Alcohol Use: No      Review of Systems  All other systems reviewed and are negative.    Allergies  Review of patient's allergies indicates no known allergies.  Home Medications   Current Outpatient Rx  Name  Route  Sig  Dispense  Refill  . HYDROCODONE-ACETAMINOPHEN 5-500 MG PO TABS   Oral   Take 1-2 tablets by mouth every 6 (six) hours as needed for pain.   15 tablet   0   . IBUPROFEN 200 MG PO TABS   Oral   Take 400 mg by mouth every 6 (six) hours as needed. Stomach Pain           BP 113/86  Pulse 68  Temp 98.3 F (36.8 C) (Oral)  Resp 16  SpO2 99%  Physical Exam General: Well-developed, well-nourished male in no acute distress; appearance consistent with age of record; appears uncomfortable HENT: normocephalic, atraumatic Eyes: pupils equal round and reactive to light;  extraocular muscles intact Neck: supple Heart: regular rate and rhythm Lungs: clear to auscultation bilaterally Abdomen: soft; nondistended; epigastric tenderness; no masses or hepatosplenomegaly; bowel sounds present Extremities: No deformity; full range of motion; pulses normal; no edema Neurologic: Awake, alert and oriented; motor function intact in all extremities and symmetric; no facial droop Skin: Warm and dry     ED Course  Procedures (including critical care time)     MDM   Nursing notes and vitals signs, including pulse oximetry, reviewed.  Summary of this visit's results, reviewed by myself:  Labs:  Results for orders placed during the hospital encounter of 02/07/12  CBC WITH DIFFERENTIAL      Component Value Range   WBC 7.3  4.0 - 10.5 K/uL   RBC 4.37  4.22 - 5.81 MIL/uL   Hemoglobin 11.6 (*) 13.0 - 17.0 g/dL   HCT 40.9 (*) 81.1 - 91.4 %   MCV 79.6  78.0 - 100.0 fL   MCH 26.5  26.0 - 34.0 pg   MCHC 33.3  30.0 - 36.0 g/dL   RDW 78.2  95.6 - 21.3 %   Platelets 187  150 - 400 K/uL   Neutrophils Relative 59  43 - 77 %   Neutro Abs 4.3  1.7 - 7.7 K/uL   Lymphocytes Relative 34  12 - 46 %   Lymphs Abs 2.5  0.7 - 4.0 K/uL   Monocytes Relative 6  3 - 12 %   Monocytes Absolute 0.4  0.1 - 1.0 K/uL   Eosinophils Relative 1  0 - 5 %   Eosinophils Absolute 0.1  0.0 - 0.7 K/uL   Basophils Relative 0  0 - 1 %   Basophils Absolute 0.0  0.0 - 0.1 K/uL  COMPREHENSIVE METABOLIC PANEL      Component Value Range   Sodium 136  135 - 145 mEq/L   Potassium 3.7  3.5 - 5.1 mEq/L   Chloride 100  96 - 112 mEq/L   CO2 27  19 - 32 mEq/L   Glucose, Bld 84  70 - 99 mg/dL   BUN 16  6 - 23 mg/dL   Creatinine, Ser 1.61  0.50 - 1.35 mg/dL   Calcium 8.6  8.4 - 09.6 mg/dL   Total Protein 6.4  6.0 - 8.3 g/dL   Albumin 3.5  3.5 - 5.2 g/dL   AST 14  0 - 37 U/L   ALT 9  0 - 53 U/L   Alkaline Phosphatase 78  39 - 117 U/L   Total Bilirubin 0.3  0.3 - 1.2 mg/dL   GFR calc non Af Amer  >90  >90 mL/min   GFR calc Af Amer >90  >90 mL/min  LIPASE, BLOOD      Component Value Range   Lipase 32  11 - 59 U/L   12:37 AM Significant relief with GI cocktail. The fact that the patient's symptoms are worse at night suggests GERD rather than gastritis or ulcer, but PPI therapy is appropriate for either.         Hanley Seamen, MD 02/08/12 [phone]

## 2012-02-07 NOTE — ED Notes (Addendum)
Pt states for the past 2 weeks his ulcers have been acting up. Pt states that he has tried advil but no OTC antiacids. Pt states no relief with advil. Pt states GI cocktail sometimes works.

## 2012-02-08 LAB — COMPREHENSIVE METABOLIC PANEL
ALT: 9 U/L (ref 0–53)
Albumin: 3.5 g/dL (ref 3.5–5.2)
Alkaline Phosphatase: 78 U/L (ref 39–117)
BUN: 16 mg/dL (ref 6–23)
Chloride: 100 mEq/L (ref 96–112)
Potassium: 3.7 mEq/L (ref 3.5–5.1)
Sodium: 136 mEq/L (ref 135–145)
Total Bilirubin: 0.3 mg/dL (ref 0.3–1.2)

## 2012-02-08 LAB — LIPASE, BLOOD: Lipase: 32 U/L (ref 11–59)

## 2012-02-08 LAB — CBC WITH DIFFERENTIAL/PLATELET
Eosinophils Absolute: 0.1 10*3/uL (ref 0.0–0.7)
Eosinophils Relative: 1 % (ref 0–5)
Hemoglobin: 11.6 g/dL — ABNORMAL LOW (ref 13.0–17.0)
Lymphs Abs: 2.5 10*3/uL (ref 0.7–4.0)
MCH: 26.5 pg (ref 26.0–34.0)
MCV: 79.6 fL (ref 78.0–100.0)
Monocytes Relative: 6 % (ref 3–12)
RBC: 4.37 MIL/uL (ref 4.22–5.81)

## 2012-02-08 MED ORDER — SUCRALFATE 1 G PO TABS: 1.0000 g | ORAL_TABLET | Freq: Four times a day (QID) | ORAL | Status: DC

## 2012-02-08 MED ORDER — LANSOPRAZOLE 15 MG PO CPDR: DELAYED_RELEASE_CAPSULE | ORAL | Status: DC

## 2012-02-08 MED ORDER — SUCRALFATE 1 G PO TABS
1.0000 g | ORAL_TABLET | Freq: Once | ORAL | Status: AC
  Administered 2012-02-08: 1 g via ORAL
  Filled 2012-02-08: qty 1

## 2012-02-08 NOTE — ED Notes (Signed)
Medication requested from pharmacy.

## 2012-02-08 NOTE — ED Notes (Signed)
I gave the patient a warm blanket. 

## 2012-09-10 ENCOUNTER — Emergency Department (INDEPENDENT_AMBULATORY_CARE_PROVIDER_SITE_OTHER)
Admission: EM | Admit: 2012-09-10 | Discharge: 2012-09-10 | Disposition: A | Discharge status: Home / Self Care | Payer: Self-pay | Source: Home / Self Care

## 2012-09-10 ENCOUNTER — Encounter (HOSPITAL_COMMUNITY): Payer: Self-pay | Admitting: Emergency Medicine

## 2012-09-10 DIAGNOSIS — K047 Periapical abscess without sinus: Secondary | ICD-10-CM

## 2012-09-10 MED ORDER — CLINDAMYCIN HCL 150 MG PO CAPS: 150.0000 mg | ORAL_CAPSULE | Freq: Four times a day (QID) | ORAL | Status: DC

## 2012-09-10 MED ORDER — MUPIROCIN CALCIUM 2 % NA OINT: TOPICAL_OINTMENT | NASAL | Status: DC

## 2012-09-10 NOTE — ED Provider Notes (Signed)
History     CSN: [id number]  Arrival date & time 09/10/12  1107   None     Chief Complaint  Patient presents with  . Abscess    (Consider location/radiation/quality/duration/timing/severity/associated sxs/prior treatment) Patient is a 47 y.o. male presenting with abscess. The history is provided by the patient.  Abscess Location:  Mouth Mouth abscess location:  Upper gingiva Abscess quality comment:  Odor in nose from abscess in mouth which has improved. Progression:  Unchanged Chronicity:  New Context comment:  Pt has taken pliers and broken 2 teeth off.   Past Medical History  Diagnosis Date  . Ulcer   . Gallstones   . Hemothorax   . Anemia     History reviewed. No pertinent past surgical history.  Family History  Problem Relation Age of Onset  . Diabetes Mother   . Hypertension Mother   . Diabetes Other     History  Substance Use Topics  . Smoking status: Current Every Day Smoker -- 0.50 packs/day    Types: Cigarettes  . Smokeless tobacco: Not on file  . Alcohol Use: No      Review of Systems  Constitutional: Negative.   HENT: Positive for dental problem. Negative for nosebleeds and facial swelling.     Allergies  Aspirin  Home Medications   Current Outpatient Rx  Name  Route  Sig  Dispense  Refill  . clindamycin (CLEOCIN) 150 MG capsule   Oral   Take 1 capsule (150 mg total) by mouth 4 (four) times daily.   28 capsule   0   . lansoprazole (PREVACID 24HR) 15 MG capsule      Take 2 tablets twice daily for 3 days then one tablet daily.         . mupirocin nasal ointment (BACTROBAN) 2 %      Apply in each nostril daily   10 g   1   . sucralfate (CARAFATE) 1 G tablet   Oral   Take 1 tablet (1 g total) by mouth 4 (four) times daily.   40 tablet   0     BP 110/70  Pulse 63  Temp(Src) 97.5 F (36.4 C) (Oral)  Resp 18  SpO2 99%  Physical Exam  Nursing note and vitals reviewed. Constitutional: He appears well-developed and  well-nourished.  HENT:  Nose: Nose normal.  Mouth/Throat: Uvula is midline and oropharynx is clear and moist. Abnormal dentition. Dental abscesses and dental caries present.      ED Course  Procedures (including critical care time)  Labs Reviewed - No data to display No results found.   1. Dental abscess       MDM          Linna Hoff, MD 09/10/12 1135

## 2012-09-10 NOTE — ED Notes (Signed)
Pt reports abscess inside mouth... Reports foul smell... Also reports taking a pliers and taking out molars ... Denies: pain, fevers, drainage... He is alert and oriented w/no signs of acute distress.

## 2012-11-08 ENCOUNTER — Emergency Department (HOSPITAL_COMMUNITY): Payer: Self-pay

## 2012-11-08 ENCOUNTER — Encounter (HOSPITAL_COMMUNITY): Payer: Self-pay | Admitting: *Deleted

## 2012-11-08 ENCOUNTER — Emergency Department (HOSPITAL_COMMUNITY)
Admission: EM | Admit: 2012-11-08 | Discharge: 2012-11-08 | Disposition: A | Discharge status: Home / Self Care | Payer: Self-pay | Attending: Emergency Medicine | Admitting: Emergency Medicine

## 2012-11-08 DIAGNOSIS — M549 Dorsalgia, unspecified: Secondary | ICD-10-CM | POA: Insufficient documentation

## 2012-11-08 DIAGNOSIS — K5900 Constipation, unspecified: Secondary | ICD-10-CM | POA: Insufficient documentation

## 2012-11-08 DIAGNOSIS — F17200 Nicotine dependence, unspecified, uncomplicated: Secondary | ICD-10-CM | POA: Insufficient documentation

## 2012-11-08 DIAGNOSIS — N23 Unspecified renal colic: Secondary | ICD-10-CM | POA: Insufficient documentation

## 2012-11-08 DIAGNOSIS — R110 Nausea: Secondary | ICD-10-CM | POA: Insufficient documentation

## 2012-11-08 DIAGNOSIS — IMO0001 Reserved for inherently not codable concepts without codable children: Secondary | ICD-10-CM | POA: Insufficient documentation

## 2012-11-08 LAB — COMPREHENSIVE METABOLIC PANEL
AST: 14 U/L (ref 0–37)
Albumin: 3.4 g/dL — ABNORMAL LOW (ref 3.5–5.2)
BUN: 16 mg/dL (ref 6–23)
Calcium: 8.8 mg/dL (ref 8.4–10.5)
Creatinine, Ser: 1.22 mg/dL (ref 0.50–1.35)

## 2012-11-08 LAB — CBC WITH DIFFERENTIAL/PLATELET
Basophils Absolute: 0 10*3/uL (ref 0.0–0.1)
Basophils Relative: 0 % (ref 0–1)
Eosinophils Absolute: 0 10*3/uL (ref 0.0–0.7)
Eosinophils Relative: 0 % (ref 0–5)
HCT: 33.9 % — ABNORMAL LOW (ref 39.0–52.0)
MCH: 26.9 pg (ref 26.0–34.0)
MCHC: 34.2 g/dL (ref 30.0–36.0)
MCV: 78.5 fL (ref 78.0–100.0)
Monocytes Absolute: 0.7 10*3/uL (ref 0.1–1.0)
Monocytes Relative: 8 % (ref 3–12)
RDW: 13.4 % (ref 11.5–15.5)

## 2012-11-08 LAB — URINE MICROSCOPIC-ADD ON

## 2012-11-08 LAB — URINALYSIS, ROUTINE W REFLEX MICROSCOPIC
Glucose, UA: NEGATIVE mg/dL
Ketones, ur: 15 mg/dL — AB
Nitrite: NEGATIVE
Protein, ur: 100 mg/dL — AB
pH: 6 (ref 5.0–8.0)

## 2012-11-08 LAB — LIPASE, BLOOD: Lipase: 18 U/L (ref 11–59)

## 2012-11-08 MED ORDER — TAMSULOSIN HCL 0.4 MG PO CAPS: 0.4000 mg | ORAL_CAPSULE | Freq: Every day | ORAL | Status: DC

## 2012-11-08 MED ORDER — ONDANSETRON 4 MG PO TBDP
4.0000 mg | ORAL_TABLET | Freq: Once | ORAL | Status: AC
  Administered 2012-11-08: 4 mg via ORAL
  Filled 2012-11-08: qty 1

## 2012-11-08 MED ORDER — ONDANSETRON 4 MG PO TBDP: ORAL_TABLET | ORAL | Status: DC

## 2012-11-08 MED ORDER — HYDROCODONE-ACETAMINOPHEN 5-325 MG PO TABS: 2.0000 | ORAL_TABLET | ORAL | Status: DC | PRN

## 2012-11-08 NOTE — ED Notes (Signed)
Patient transported to CT 

## 2012-11-08 NOTE — ED Notes (Signed)
Arrived by [facility] - States awoken at 0400 c/o LUQ and nausea no emesis

## 2012-11-08 NOTE — ED Provider Notes (Signed)
CSN: [id number]     Arrival date & time 11/08/12  4098 History     First MD Initiated Contact with Patient 11/08/12 [phone]     Chief Complaint  Patient presents with  . Flank Pain   (Consider location/radiation/quality/duration/timing/severity/associated sxs/prior Treatment) HPI Pt report acute onset of L flank pain radiating to left abdomin starting around 0400 this AM associated with mild nausea. No fever or chills. No urinary symptoms including hematuria. No V/D. +constipation recently but had BM this AM. Pain has improved since onset. No heavy lifting. No known history of renal stones.  Past Medical History  Diagnosis Date  . Ulcer   . Gallstones   . Hemothorax   . Anemia    History reviewed. No pertinent past surgical history. Family History  Problem Relation Age of Onset  . Diabetes Mother   . Hypertension Mother   . Diabetes Other    History  Substance Use Topics  . Smoking status: Current Every Day Smoker -- 0.50 packs/day    Types: Cigarettes  . Smokeless tobacco: Not on file  . Alcohol Use: No    Review of Systems  Constitutional: Negative for fever and chills.  Respiratory: Negative for cough and shortness of breath.   Cardiovascular: Negative for chest pain.  Gastrointestinal: Positive for nausea and constipation. Negative for vomiting and diarrhea.  Genitourinary: Negative for frequency, hematuria, flank pain and difficulty urinating.  Musculoskeletal: Positive for myalgias and back pain.  Skin: Negative for rash and wound.  Neurological: Negative for dizziness, weakness, light-headedness, numbness and headaches.  All other systems reviewed and are negative.    Allergies  Aspirin  Home Medications   Current Outpatient Rx  Name  Route  Sig  Dispense  Refill  . ibuprofen (ADVIL,MOTRIN) 200 MG tablet   Oral   Take 800 mg by mouth every 8 (eight) hours as needed for pain.         Marland Kitchen HYDROcodone-acetaminophen (NORCO) 5-325 MG per tablet   Oral    Take 2 tablets by mouth every 4 (four) hours as needed for pain.   10 tablet   0   . ondansetron (ZOFRAN ODT) 4 MG disintegrating tablet      4mg  ODT q4 hours prn nausea/vomit   6 tablet   0   . tamsulosin (FLOMAX) 0.4 MG CAPS capsule   Oral   Take 1 capsule (0.4 mg total) by mouth daily.   30 capsule   0    BP 141/85  Pulse 53  Temp(Src) 98.2 F (36.8 C) (Oral)  Resp 18  Ht 5\' 9"  (1.753 m)  Wt 155 lb (70.308 kg)  BMI 22.88 kg/m2  SpO2 100% Physical Exam  Nursing note and vitals reviewed. Constitutional: He is oriented to person, place, and time. He appears well-developed and well-nourished. No distress.  Pt appears comfortable in bed.   HENT:  Head: Normocephalic and atraumatic.  Mouth/Throat: Oropharynx is clear and moist.  Eyes: EOM are normal. Pupils are equal, round, and reactive to light.  Neck: Normal range of motion. Neck supple.  Cardiovascular: Normal rate and regular rhythm.   Pulmonary/Chest: Effort normal and breath sounds normal. No respiratory distress. He has no wheezes. He has no rales.  Abdominal: Soft. Bowel sounds are normal. He exhibits no distension and no mass. There is no tenderness. There is no rebound and no guarding.  Musculoskeletal: Normal range of motion. He exhibits tenderness (Mild Left lumbar paraspinal tenderness to palpation. No midline tenderness). He exhibits no  edema.  Neurological: He is alert and oriented to person, place, and time.  5/5 motor in all ext, sensation intact  Skin: Skin is warm and dry. No rash noted. No erythema.  Psychiatric: He has a normal mood and affect. His behavior is normal.    ED Course   Procedures (including critical care time)  Labs Reviewed  CBC WITH DIFFERENTIAL - Abnormal; Notable for the following:    Hemoglobin 11.6 (*)    HCT 33.9 (*)    Neutrophils Relative % 83 (*)    Lymphocytes Relative 9 (*)    All other components within normal limits  COMPREHENSIVE METABOLIC PANEL - Abnormal;  Notable for the following:    Potassium 3.2 (*)    Glucose, Bld 112 (*)    Albumin 3.4 (*)    GFR calc non Af Amer 69 (*)    GFR calc Af Amer 80 (*)    All other components within normal limits  URINALYSIS, ROUTINE W REFLEX MICROSCOPIC - Abnormal; Notable for the following:    Color, Urine AMBER (*)    APPearance CLOUDY (*)    Specific Gravity, Urine 1.031 (*)    Hgb urine dipstick LARGE (*)    Bilirubin Urine SMALL (*)    Ketones, ur 15 (*)    Protein, ur 100 (*)    Leukocytes, UA SMALL (*)    All other components within normal limits  URINE MICROSCOPIC-ADD ON - Abnormal; Notable for the following:    Bacteria, UA FEW (*)    Crystals CA OXALATE CRYSTALS (*)    All other components within normal limits  URINE CULTURE  LIPASE, BLOOD   Ct Abdomen Pelvis Wo Contrast  11/08/2012   *RADIOLOGY REPORT*  Clinical Data: Left upper quadrant pain and nausea.  CT ABDOMEN AND PELVIS WITHOUT CONTRAST  Technique:  Multidetector CT imaging of the abdomen and pelvis was performed following the standard protocol without intravenous contrast.  Comparison: KUB earlier this same date.  Findings: The lung bases are clear.  No pleural or pericardial effusion.  There is moderate left hydronephrosis due to a punctate stone measuring 1-2 mm at the left UVJ or just within the urinary bladder. A punctate nonobstructing stone is also identified in the lower pole of the left kidney.  The right kidney appears normal.  The gallbladder, liver, spleen, adrenal glands and pancreas appear normal.  There is a small amount of free pelvic fluid.  No lymphadenopathy is identified.  The stomach and small and large bowel appear normal. The appendix is not discretely visualized but no evidence of focal inflammatory process is seen on this exam.  IMPRESSION:  1.  Moderate left hydronephrosis due to a 1-2 mm stone which is either at the left UVJ or has passed into the urinary bladder. 2.  Punctate nonobstructing stone lower pole left  kidney. 3.  Small amount of free pelvic fluid is nonspecific.  Cause for the fluid is not identified.  Question gastroenteritis.   Original Report Authenticated By: Holley Dexter, M.D.   Dg Abd 1 View  11/08/2012   *RADIOLOGY REPORT*  Clinical Data: Left flank pain.  ABDOMEN - 1 VIEW  Comparison: None.  Findings: No plain film evidence of urinary tract stone is identified.  Gas and stool could obscure small stones, particularly over the left kidney and proximal ureter.  No evidence of bowel obstruction is identified.  Exostoses off the periphery the iliac wings may be due to old trauma.  IMPRESSION: Negative for plain  film evidence of urinary tract stones.  Gas and stool could obscure small stones over the left kidney and proximal left ureter.   Original Report Authenticated By: Holley Dexter, M.D.   1. Renal colic on left side     MDM  Pt is feeling much better. Advised f/u with urologist for persistent symptoms and return precautions given.   Loren Racer, MD 11/08/12 1120

## 2012-11-08 NOTE — ED Notes (Signed)
Patient transported to X-ray 

## 2012-11-08 NOTE — ED Notes (Signed)
C/o left lower rib/flank pain & nausea. Reports pain worse with mvmt & deep breaths. Denies dysuria, hematuria, urinary frequency, diarrhea. Denies injury, recent heavy lifting, physical activity.

## 2012-11-09 LAB — URINE CULTURE: Colony Count: NO GROWTH

## 2014-01-30 ENCOUNTER — Encounter: Payer: Self-pay | Admitting: Family Medicine

## 2014-01-30 ENCOUNTER — Ambulatory Visit (HOSPITAL_BASED_OUTPATIENT_CLINIC_OR_DEPARTMENT_OTHER): Payer: Self-pay | Admitting: *Deleted

## 2014-01-30 ENCOUNTER — Ambulatory Visit: Payer: Self-pay | Attending: Family Medicine | Admitting: Family Medicine

## 2014-01-30 VITALS — BP 113/63 | HR 63 | Temp 97.9°F | Resp 16 | Ht 69.5 in | Wt 146.0 lb

## 2014-01-30 DIAGNOSIS — S025XXA Fracture of tooth (traumatic), initial encounter for closed fracture: Secondary | ICD-10-CM | POA: Insufficient documentation

## 2014-01-30 DIAGNOSIS — Z418 Encounter for other procedures for purposes other than remedying health state: Secondary | ICD-10-CM

## 2014-01-30 DIAGNOSIS — R351 Nocturia: Secondary | ICD-10-CM | POA: Insufficient documentation

## 2014-01-30 DIAGNOSIS — K088 Other specified disorders of teeth and supporting structures: Secondary | ICD-10-CM

## 2014-01-30 DIAGNOSIS — Z23 Encounter for immunization: Secondary | ICD-10-CM

## 2014-01-30 DIAGNOSIS — F17200 Nicotine dependence, unspecified, uncomplicated: Secondary | ICD-10-CM

## 2014-01-30 DIAGNOSIS — Z8249 Family history of ischemic heart disease and other diseases of the circulatory system: Secondary | ICD-10-CM

## 2014-01-30 DIAGNOSIS — K029 Dental caries, unspecified: Secondary | ICD-10-CM | POA: Insufficient documentation

## 2014-01-30 DIAGNOSIS — D649 Anemia, unspecified: Secondary | ICD-10-CM

## 2014-01-30 DIAGNOSIS — Z114 Encounter for screening for human immunodeficiency virus [HIV]: Secondary | ICD-10-CM | POA: Insufficient documentation

## 2014-01-30 DIAGNOSIS — N400 Enlarged prostate without lower urinary tract symptoms: Secondary | ICD-10-CM

## 2014-01-30 DIAGNOSIS — Z113 Encounter for screening for infections with a predominantly sexual mode of transmission: Secondary | ICD-10-CM

## 2014-01-30 DIAGNOSIS — Z716 Tobacco abuse counseling: Secondary | ICD-10-CM | POA: Insufficient documentation

## 2014-01-30 DIAGNOSIS — L723 Sebaceous cyst: Secondary | ICD-10-CM

## 2014-01-30 DIAGNOSIS — Z720 Tobacco use: Secondary | ICD-10-CM

## 2014-01-30 DIAGNOSIS — Z299 Encounter for prophylactic measures, unspecified: Secondary | ICD-10-CM

## 2014-01-30 DIAGNOSIS — K0889 Other specified disorders of teeth and supporting structures: Secondary | ICD-10-CM

## 2014-01-30 DIAGNOSIS — E876 Hypokalemia: Secondary | ICD-10-CM

## 2014-01-30 DIAGNOSIS — N401 Enlarged prostate with lower urinary tract symptoms: Secondary | ICD-10-CM | POA: Insufficient documentation

## 2014-01-30 LAB — COMPLETE METABOLIC PANEL WITH GFR
ALT: <8 U/L (ref 0–53)
AST: 14 U/L (ref 0–37)
Albumin: 4.1 g/dL (ref 3.5–5.2)
Alkaline Phosphatase: 86 U/L (ref 39–117)
BUN: 10 mg/dL (ref 6–23)
CALCIUM: 8.7 mg/dL (ref 8.4–10.5)
CHLORIDE: 105 meq/L (ref 96–112)
CO2: 24 meq/L (ref 19–32)
CREATININE: 0.84 mg/dL (ref 0.50–1.35)
GFR, Est Non African American: >89 mL/min
GLUCOSE: 81 mg/dL (ref 70–99)
Potassium: 4.4 mEq/L (ref 3.5–5.3)
Sodium: 138 mEq/L (ref 135–145)
Total Bilirubin: 0.4 mg/dL (ref 0.2–1.2)
Total Protein: 6.5 g/dL (ref 6.0–8.3)

## 2014-01-30 LAB — CBC
HEMATOCRIT: 37.3 % — AB (ref 39.0–52.0)
Hemoglobin: 12.2 g/dL — ABNORMAL LOW (ref 13.0–17.0)
MCH: 25.8 pg — ABNORMAL LOW (ref 26.0–34.0)
MCHC: 32.7 g/dL (ref 30.0–36.0)
MCV: 78.9 fL (ref 78.0–100.0)
Platelets: 237 10*3/uL (ref 150–400)
RBC: 4.73 MIL/uL (ref 4.22–5.81)
RDW: 13.5 % (ref 11.5–15.5)
WBC: 6.8 10*3/uL (ref 4.0–10.5)

## 2014-01-30 LAB — LIPID PANEL
CHOLESTEROL: 118 mg/dL (ref 0–200)
HDL: 51 mg/dL (ref >39)
LDL Cholesterol: 56 mg/dL (ref 0–99)
TRIGLYCERIDES: 56 mg/dL (ref <150)
Total CHOL/HDL Ratio: 2.3 Ratio
VLDL: 11 mg/dL (ref 0–40)

## 2014-01-30 LAB — IRON AND TIBC
%SAT: 24 % (ref 20–55)
Iron: 64 ug/dL (ref 42–165)
TIBC: 271 ug/dL (ref 215–435)
UIBC: 207 ug/dL (ref 125–400)

## 2014-01-30 LAB — FERRITIN: FERRITIN: 48 ng/mL (ref 22–322)

## 2014-01-30 MED ORDER — BUPROPION HCL ER (SR) 150 MG PO TB12: 150.0000 mg | ORAL_TABLET | Freq: Two times a day (BID) | ORAL | Status: DC

## 2014-01-30 NOTE — Assessment & Plan Note (Addendum)
A; enlarged prostate with nocturia x 1 episode in a smoker P: PSA Smoking cessation  Normal PSA

## 2014-01-30 NOTE — Assessment & Plan Note (Signed)
A: brother died of early cardiac death while playing basketball.  Mother with MI x 3. P: Check lipids Smoking cessation

## 2014-01-30 NOTE — Progress Notes (Signed)
Establish Care Annual Physical Dental referral, broken tooth

## 2014-01-30 NOTE — Patient Instructions (Signed)
Marvin Thomas,  Thank you for coming in today. It was a pleasure meeting you. I look forward to being your primary doctor.   1. Smoking: Please work on quitting smoking Smoking cessation support: smoking cessation hotline: 1-800-QUIT-NOW.  Smoking cessation classes are available through [facility]. Call [phone] or visit our website at [email or web address] wellbutrin 150 mg daily for 3 days, then twice daily, take doses 8 hrs apart, take second dose before 6 pm. Set quit date 7 days after starting the medication. In addition add nicotine replacement  Patch 14 mg daily for 2 weeks, then 7 mg for 4-6 weeks  Gum or lozenge.   2. Dental pain: Apply for orange card and [place] discount. Call the #'s provided.   You will be called lab results.   F/u in 6 week for smoking f/u.  Dr. Adrian Blackwater

## 2014-01-30 NOTE — Assessment & Plan Note (Addendum)
Screening HIV and RPR  HIV and RPR negative

## 2014-01-30 NOTE — Assessment & Plan Note (Signed)
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

## 2014-01-30 NOTE — Assessment & Plan Note (Signed)
A: x 30 years. [place] 100. Desires to quit P:  Smoking cessation counseling done. Rx for wellbutrin Recommended nicotine replacement  F/u in 6 weeks

## 2014-01-30 NOTE — Assessment & Plan Note (Signed)
A: x 4 years with multiple broken teeth. No abscess P: Dental referral List of dentist provided

## 2014-01-30 NOTE — Progress Notes (Addendum)
   Subjective:    Patient ID: Marvin Thomas, male    DOB: 19-Apr-1965, 48 y.o.   MRN: [id number] CC: establish care, dental referral for broken tooth  HPI 48 yo M presents to establish care and discuss the following:  1. Broken teeth: x 4 years. Painful. Patient pulled teeth with pliers. Has occasional L sided headache. No oral swelling. No fever.   Med Hx: anemia  Fam Hx; MI in mother and brothers  Soc Hx: smoking 1/2 PPD x 30 years.  Review of Systems Review of Symptoms General:  Negative for unexplained weight loss, fever Skin: Negative for new or changing mole, sore that won't heal HEENT: Negative for trouble hearing, trouble seeing, ringing in ears, mouth sores, hoarseness, change in voice, dysphagia. CV:  Negative for chest pain, dyspnea, edema, palpitations Resp: Negative for cough, dyspnea, hemoptysis GI: Negative for nausea, vomiting, diarrhea, constipation, abdominal pain, melena, hematochezia. GU: Positive for nocturia 1 x per night.  Negative for dysuria, incontinence, urinary hesitance, hematuria, vaginal or penile discharge, polyuria, sexual difficulty, lumps in testicle or breasts MSK: Negative for muscle cramps or aches, joint pain or swelling Neuro: Negative for headaches, weakness, numbness, dizziness, passing out/fainting Psych: Negative for depression, anxiety, memory problems     Objective:   Physical Exam BP 113/63 mmHg  Pulse 63  Temp(Src) 97.9 F (36.6 C) (Oral)  Resp 16  Ht 5' 9.5" (1.765 m)  Wt 146 lb (66.225 kg)  BMI 21.26 kg/m2  SpO2 99%  General Appearance:    Alert, cooperative, no distress, appears stated age  Head:    Normocephalic, without obvious abnormality, atraumatic  Eyes:    PERRL, conjunctiva/corneas clear, EOM's intact both eyes       Ears:    Normal TM's and external ear canals, both ears  Nose:   Nares normal, septum midline, mucosa normal, no drainage   or sinus tenderness  Throat:   Poor dentition with multiple carries and  broken teeth.    Lips, mucosa, and tongue normal;  gums normal  Neck:   Supple, symmetrical, trachea midline, no adenopathy;       thyroid:  No enlargement/tenderness/nodules.    Back:     Symmetric, no curvature, ROM normal, no CVA tenderness  Lungs:     Clear to auscultation bilaterally, respirations unlabored  Chest wall:    No tenderness or deformity  Heart:    Regular rate and rhythm, S1 and S2 normal, no murmur, rub   or gallop  Abdomen:     Soft, non-tender, bowel sounds active all four quadrants,    no masses, no organomegaly  Genitalia:    Normal male without lesion, discharge or tenderness  Rectal:    Prostate enlarged, soft, symetrical. Normal tone, no masses or tenderness;   guaiac negative stool  Extremities:   Extremities normal, atraumatic, no cyanosis or edema  Pulses:   2+ and symmetric all extremities  Skin:   Cyst L cheek non fluctuant, non tender, no drainage. Skin color, texture, turgor normal, no rashes.   Lymph nodes:   Cervical, supraclavicular, and axillary nodes normal  Neurologic:   CNII-XII intact. Normal strength, sensation and reflexes      throughout     Assessment & Plan:

## 2014-01-31 LAB — RPR

## 2014-01-31 LAB — PSA: PSA: 0.81 ng/mL (ref <=4.00)

## 2014-01-31 LAB — HIV ANTIBODY (ROUTINE TESTING W REFLEX): HIV: NONREACTIVE

## 2014-02-01 DIAGNOSIS — E876 Hypokalemia: Secondary | ICD-10-CM | POA: Insufficient documentation

## 2014-02-01 MED ORDER — POTASSIUM CHLORIDE CRYS ER 20 MEQ PO TBCR: 40.0000 meq | EXTENDED_RELEASE_TABLET | Freq: Every day | ORAL | Status: DC

## 2014-02-01 NOTE — Assessment & Plan Note (Signed)
Slight hypokalemia KDur 40 mEq x 3 days

## 2014-02-01 NOTE — Addendum Note (Signed)
Addended by: Boykin Nearing on: 02/01/2014 12:02 PM   Modules accepted: Orders

## 2014-02-21 ENCOUNTER — Telehealth: Payer: Self-pay | Admitting: *Deleted

## 2014-02-21 NOTE — Telephone Encounter (Signed)
-----   Message from Minerva Ends, MD sent at 02/01/2014 11:59 AM EST ----- HIV negative, RPR negative. Slight anemia w/o iron deficiency. Normal folic acid.  Slightly low potassium sent in supplement 40 mEq daily x 3 days  Otherwise normal lipids and CMP.

## 2014-02-21 NOTE — Telephone Encounter (Signed)
Left message normal labs, low potassium , advised to pick up Rx at [place]  If any question return call

## 2014-02-24 ENCOUNTER — Ambulatory Visit: Payer: Self-pay

## 2014-04-06 ENCOUNTER — Encounter (HOSPITAL_COMMUNITY): Payer: Self-pay | Admitting: Orthopedic Surgery

## 2014-04-25 ENCOUNTER — Encounter (HOSPITAL_COMMUNITY): Payer: Self-pay | Admitting: Nurse Practitioner

## 2014-04-25 ENCOUNTER — Emergency Department (HOSPITAL_COMMUNITY)
Admission: EM | Admit: 2014-04-25 | Discharge: 2014-04-25 | Disposition: A | Discharge status: Home / Self Care | Payer: Self-pay | Attending: Emergency Medicine | Admitting: Emergency Medicine

## 2014-04-25 DIAGNOSIS — Z720 Tobacco use: Secondary | ICD-10-CM | POA: Insufficient documentation

## 2014-04-25 DIAGNOSIS — Z79899 Other long term (current) drug therapy: Secondary | ICD-10-CM | POA: Insufficient documentation

## 2014-04-25 DIAGNOSIS — Z8719 Personal history of other diseases of the digestive system: Secondary | ICD-10-CM | POA: Insufficient documentation

## 2014-04-25 DIAGNOSIS — Z862 Personal history of diseases of the blood and blood-forming organs and certain disorders involving the immune mechanism: Secondary | ICD-10-CM | POA: Insufficient documentation

## 2014-04-25 DIAGNOSIS — Z8709 Personal history of other diseases of the respiratory system: Secondary | ICD-10-CM | POA: Insufficient documentation

## 2014-04-25 DIAGNOSIS — R1013 Epigastric pain: Secondary | ICD-10-CM | POA: Insufficient documentation

## 2014-04-25 LAB — COMPREHENSIVE METABOLIC PANEL
ALT: 12 U/L (ref 0–53)
AST: 24 U/L (ref 0–37)
Albumin: 3.7 g/dL (ref 3.5–5.2)
Alkaline Phosphatase: 73 U/L (ref 39–117)
Anion gap: 4 — ABNORMAL LOW (ref 5–15)
BUN: 10 mg/dL (ref 6–23)
CO2: 25 mmol/L (ref 19–32)
Calcium: 8.8 mg/dL (ref 8.4–10.5)
Chloride: 105 mmol/L (ref 96–112)
Creatinine, Ser: 0.93 mg/dL (ref 0.50–1.35)
GFR calc Af Amer: >90 mL/min (ref >90)
GFR calc non Af Amer: >90 mL/min (ref >90)
Glucose, Bld: 111 mg/dL — ABNORMAL HIGH (ref 70–99)
Potassium: 3.7 mmol/L (ref 3.5–5.1)
Sodium: 134 mmol/L — ABNORMAL LOW (ref 135–145)
Total Bilirubin: 0.5 mg/dL (ref 0.3–1.2)
Total Protein: 6.4 g/dL (ref 6.0–8.3)

## 2014-04-25 LAB — CBC WITH DIFFERENTIAL/PLATELET
Basophils Absolute: 0 10*3/uL (ref 0.0–0.1)
Basophils Relative: 0 % (ref 0–1)
Eosinophils Absolute: 0 10*3/uL (ref 0.0–0.7)
Eosinophils Relative: 1 % (ref 0–5)
HCT: 35.9 % — ABNORMAL LOW (ref 39.0–52.0)
Hemoglobin: 11.9 g/dL — ABNORMAL LOW (ref 13.0–17.0)
Lymphocytes Relative: 35 % (ref 12–46)
Lymphs Abs: 1.7 10*3/uL (ref 0.7–4.0)
MCH: 26 pg (ref 26.0–34.0)
MCHC: 33.1 g/dL (ref 30.0–36.0)
MCV: 78.6 fL (ref 78.0–100.0)
Monocytes Absolute: 0.2 10*3/uL (ref 0.1–1.0)
Monocytes Relative: 5 % (ref 3–12)
Neutro Abs: 2.9 10*3/uL (ref 1.7–7.7)
Neutrophils Relative %: 59 % (ref 43–77)
Platelets: 175 10*3/uL (ref 150–400)
RBC: 4.57 MIL/uL (ref 4.22–5.81)
RDW: 12.5 % (ref 11.5–15.5)
WBC: 4.8 10*3/uL (ref 4.0–10.5)

## 2014-04-25 LAB — LIPASE, BLOOD: Lipase: 53 U/L (ref 11–59)

## 2014-04-25 MED ORDER — FAMOTIDINE 20 MG PO TABS: 20.0000 mg | ORAL_TABLET | Freq: Two times a day (BID) | ORAL | Status: DC

## 2014-04-25 MED ORDER — TRAMADOL HCL 50 MG PO TABS: 50.0000 mg | ORAL_TABLET | Freq: Four times a day (QID) | ORAL | Status: DC | PRN

## 2014-04-25 MED ORDER — GI COCKTAIL ~~LOC~~
30.0000 mL | Freq: Once | ORAL | Status: AC
  Administered 2014-04-25: 30 mL via ORAL
  Filled 2014-04-25: qty 30

## 2014-04-25 MED ORDER — PANTOPRAZOLE SODIUM 40 MG IV SOLR
40.0000 mg | Freq: Once | INTRAVENOUS | Status: AC
  Administered 2014-04-25: 40 mg via INTRAVENOUS
  Filled 2014-04-25: qty 40

## 2014-04-25 MED ORDER — OMEPRAZOLE 20 MG PO CPDR: 20.0000 mg | DELAYED_RELEASE_CAPSULE | Freq: Every day | ORAL | Status: DC

## 2014-04-25 MED ORDER — FAMOTIDINE IN NACL 20-0.9 MG/50ML-% IV SOLN
20.0000 mg | Freq: Once | INTRAVENOUS | Status: AC
  Administered 2014-04-25: 20 mg via INTRAVENOUS
  Filled 2014-04-25: qty 50

## 2014-04-25 MED ORDER — SODIUM CHLORIDE 0.9 % IV BOLUS (SEPSIS)
1000.0000 mL | Freq: Once | INTRAVENOUS | Status: AC
  Administered 2014-04-25: 1000 mL via INTRAVENOUS

## 2014-04-25 NOTE — ED Notes (Signed)
Educated patient on avoiding NSAIDS with gastric ulcers and appropriate diet.

## 2014-04-25 NOTE — ED Notes (Signed)
Pt c/o RUQ abdominal pain x2 weeks indicative of his gastric ulcer pain. Pt sts follwed up with gastro doctor 2 years ago but medications prescribed were to expensive. Pt sts has been taking advil for pain and has increased stress in his life making ulcer pain 10/10. Pt sts pain has been consistent for the past 2 weeks with worsening episodes at night. Pt denies n/v/d. Pt sts 2 nights ago had an episode of extreme pain with shortness of breath, and a syncopal episode lasting less than a minute. Pt is alert and oriented at this time in NAD, ambulatory without assistance.

## 2014-04-25 NOTE — Discharge Instructions (Signed)

## 2014-05-01 NOTE — ED Provider Notes (Signed)
CSN: [id number]     Arrival date & time 04/25/14  1040 History   First MD Initiated Contact with Patient 04/25/14 1102     Chief Complaint  Patient presents with  . Abdominal Pain     (Consider location/radiation/quality/duration/timing/severity/associated sxs/prior Treatment) HPI   49 year old male with abdominal pain. Onset about 2 weeks ago and progressively worsening. Patient reports a past history of gastric ulcers in the current symptoms reminiscent of this. He reports that he's been taking ibuprofen on a regular basis because of the pain. Pain is in epigastrium. Worse shortly after eating. No other appreciable exacerbating relieving factors. Mild associated nausea. No vomiting. No urinary complaints. No blood or melena. No chest pain or shortness of breath.  Past Medical History  Diagnosis Date  . Ulcer   . Gallstones   . Hemothorax   . Anemia Dx 2014   History reviewed. No pertinent past surgical history. Family History  Problem Relation Age of Onset  . Diabetes Mother   . Hypertension Mother   . Heart disease Mother     x 3   . Diabetes Other   . Heart disease Brother   . Hypertension Brother   . Cancer Neg Hx   . Hypertension Brother   . Hypertension Brother    History  Substance Use Topics  . Smoking status: Current Every Day Smoker -- 1.50 packs/day for 31 years    Types: Cigarettes  . Smokeless tobacco: Never Used  . Alcohol Use: No     Comment: quit 1980's     Review of Systems  All systems reviewed and negative, other than as noted in HPI.   Allergies  Aspirin  Home Medications   Prior to Admission medications   Medication Sig Start Date End Date Taking? Authorizing Provider  buPROPion (WELLBUTRIN SR) 150 MG 12 hr tablet Take 1 tablet (150 mg total) by mouth 2 (two) times daily. Patient not taking: Reported on 04/25/2014 01/30/14   Minerva Ends, MD  famotidine (PEPCID) 20 MG tablet Take 1 tablet (20 mg total) by mouth 2 (two) times daily.  04/25/14   Virgel Manifold, MD  omeprazole (PRILOSEC) 20 MG capsule Take 1 capsule (20 mg total) by mouth daily. 04/25/14   Virgel Manifold, MD  potassium chloride SA (K-DUR,KLOR-CON) 20 MEQ tablet Take 2 tablets (40 mEq total) by mouth daily. Patient not taking: Reported on 04/25/2014 02/01/14   Minerva Ends, MD  traMADol (ULTRAM) 50 MG tablet Take 1 tablet (50 mg total) by mouth every 6 (six) hours as needed. 04/25/14   Virgel Manifold, MD   BP 127/83 mmHg  Pulse 56  Temp(Src) 98.3 F (36.8 C) (Oral)  Resp 16  Ht 5\' 9"  (1.753 m)  Wt 155 lb (70.308 kg)  BMI 22.88 kg/m2  SpO2 100% Physical Exam  Constitutional: He appears well-developed and well-nourished. No distress.  HENT:  Head: Normocephalic and atraumatic.  Eyes: Conjunctivae are normal. Right eye exhibits no discharge. Left eye exhibits no discharge.  Neck: Neck supple.  Cardiovascular: Normal rate, regular rhythm and normal heart sounds.  Exam reveals no gallop and no friction rub.   No murmur heard. Pulmonary/Chest: Effort normal and breath sounds normal. No respiratory distress.  Abdominal: Soft. He exhibits no distension. There is tenderness.  Epigastric tenderness without rebound or guarding  Musculoskeletal: He exhibits no edema or tenderness.  Neurological: He is alert.  Skin: Skin is warm and dry.  Psychiatric: He has a normal mood and affect. His behavior  is normal. Thought content normal.  Nursing note and vitals reviewed.   ED Course  Procedures (including critical care time) Labs Review Labs Reviewed  CBC WITH DIFFERENTIAL/PLATELET - Abnormal; Notable for the following:    Hemoglobin 11.9 (*)    HCT 35.9 (*)    All other components within normal limits  COMPREHENSIVE METABOLIC PANEL - Abnormal; Notable for the following:    Sodium 134 (*)    Glucose, Bld 111 (*)    Anion gap 4 (*)    All other components within normal limits  LIPASE, BLOOD    Imaging Review No results found.   EKG  Interpretation   Date/Time:  Tuesday April 25 2014 10:52:15 EST Ventricular Rate:  63 PR Interval:  147 QRS Duration: 89 QT Interval:  396 QTC Calculation: 405 R Axis:   97 Text Interpretation:  Sinus rhythm Borderline right axis deviation  Confirmed by   MD,  ([id number]) on 04/25/2014 11:19:33 AM      MDM   Final diagnoses:  Epigastric pain    49 year old male with epigastric pain. Suspect peptic ulcer disease. Advised patient to avoid NSAIDs as he is taking them regularly and likely exacerbating things. PPM in addition short course of H2 blocker initially. Outpatient follow-up. Low suspicion for more sinister process. Very atypical for ACS. Doubt acute surgical pathology.  Virgel Manifold, MD 05/01/14 [phone]

## 2014-11-29 ENCOUNTER — Emergency Department (HOSPITAL_COMMUNITY)
Admission: EM | Admit: 2014-11-29 | Discharge: 2014-11-29 | Disposition: A | Discharge status: Home / Self Care | Payer: Self-pay | Attending: Emergency Medicine | Admitting: Emergency Medicine

## 2014-11-29 ENCOUNTER — Encounter (HOSPITAL_COMMUNITY): Payer: Self-pay | Admitting: *Deleted

## 2014-11-29 DIAGNOSIS — Z8719 Personal history of other diseases of the digestive system: Secondary | ICD-10-CM | POA: Insufficient documentation

## 2014-11-29 DIAGNOSIS — R1013 Epigastric pain: Secondary | ICD-10-CM | POA: Insufficient documentation

## 2014-11-29 DIAGNOSIS — Z720 Tobacco use: Secondary | ICD-10-CM | POA: Insufficient documentation

## 2014-11-29 DIAGNOSIS — Z79899 Other long term (current) drug therapy: Secondary | ICD-10-CM | POA: Insufficient documentation

## 2014-11-29 DIAGNOSIS — Z862 Personal history of diseases of the blood and blood-forming organs and certain disorders involving the immune mechanism: Secondary | ICD-10-CM | POA: Insufficient documentation

## 2014-11-29 LAB — URINALYSIS, ROUTINE W REFLEX MICROSCOPIC
GLUCOSE, UA: NEGATIVE mg/dL
Hgb urine dipstick: NEGATIVE
Ketones, ur: NEGATIVE mg/dL
Nitrite: NEGATIVE
PROTEIN: NEGATIVE mg/dL
Specific Gravity, Urine: 1.029 (ref 1.005–1.030)
Urobilinogen, UA: 1 mg/dL (ref 0.0–1.0)
pH: 6 (ref 5.0–8.0)

## 2014-11-29 LAB — URINE MICROSCOPIC-ADD ON

## 2014-11-29 LAB — COMPREHENSIVE METABOLIC PANEL
ALK PHOS: 67 U/L (ref 38–126)
ALT: 13 U/L — AB (ref 17–63)
AST: 20 U/L (ref 15–41)
Albumin: 3.3 g/dL — ABNORMAL LOW (ref 3.5–5.0)
Anion gap: 6 (ref 5–15)
BUN: 11 mg/dL (ref 6–20)
CO2: 26 mmol/L (ref 22–32)
CREATININE: 1.08 mg/dL (ref 0.61–1.24)
Calcium: 8.6 mg/dL — ABNORMAL LOW (ref 8.9–10.3)
Chloride: 105 mmol/L (ref 101–111)
GFR calc Af Amer: >60 mL/min (ref >60)
GFR calc non Af Amer: >60 mL/min (ref >60)
Glucose, Bld: 98 mg/dL (ref 65–99)
Potassium: 3.6 mmol/L (ref 3.5–5.1)
Sodium: 137 mmol/L (ref 135–145)
Total Bilirubin: 0.4 mg/dL (ref 0.3–1.2)
Total Protein: 5.9 g/dL — ABNORMAL LOW (ref 6.5–8.1)

## 2014-11-29 LAB — CBC
HCT: 33.7 % — ABNORMAL LOW (ref 39.0–52.0)
Hemoglobin: 10.8 g/dL — ABNORMAL LOW (ref 13.0–17.0)
MCH: 25.7 pg — ABNORMAL LOW (ref 26.0–34.0)
MCHC: 32 g/dL (ref 30.0–36.0)
MCV: 80.2 fL (ref 78.0–100.0)
Platelets: 220 10*3/uL (ref 150–400)
RBC: 4.2 MIL/uL — AB (ref 4.22–5.81)
RDW: 13.6 % (ref 11.5–15.5)
WBC: 6.3 10*3/uL (ref 4.0–10.5)

## 2014-11-29 LAB — LIPASE, BLOOD: Lipase: 34 U/L (ref 22–51)

## 2014-11-29 MED ORDER — FENTANYL CITRATE (PF) 100 MCG/2ML IJ SOLN
50.0000 ug | Freq: Once | INTRAMUSCULAR | Status: AC
Start: 2014-11-29 — End: 2014-11-29
  Administered 2014-11-29: 50 ug via INTRAVENOUS
  Filled 2014-11-29: qty 2

## 2014-11-29 MED ORDER — SUCRALFATE 1 GM/10ML PO SUSP
1.0000 g | Freq: Three times a day (TID) | ORAL | Status: DC
  Administered 2014-11-29: 1 g via ORAL
  Filled 2014-11-29 (×3): qty 10

## 2014-11-29 MED ORDER — FENTANYL CITRATE (PF) 100 MCG/2ML IJ SOLN
50.0000 ug | Freq: Once | INTRAMUSCULAR | Status: AC
  Administered 2014-11-29: 50 ug via INTRAVENOUS
  Filled 2014-11-29: qty 2

## 2014-11-29 MED ORDER — PANTOPRAZOLE SODIUM 20 MG PO TBEC: 20.0000 mg | DELAYED_RELEASE_TABLET | Freq: Every day | ORAL | Status: DC

## 2014-11-29 MED ORDER — PANTOPRAZOLE SODIUM 20 MG PO TBEC
20.0000 mg | DELAYED_RELEASE_TABLET | Freq: Every day | ORAL | Status: DC
  Administered 2014-11-29: 20 mg via ORAL
  Filled 2014-11-29: qty 1

## 2014-11-29 MED ORDER — GI COCKTAIL ~~LOC~~
30.0000 mL | Freq: Once | ORAL | Status: AC
  Administered 2014-11-29: 30 mL via ORAL
  Filled 2014-11-29: qty 30

## 2014-11-29 MED ORDER — SUCRALFATE 1 GM/10ML PO SUSP: 1.0000 g | Freq: Three times a day (TID) | ORAL | Status: DC

## 2014-11-29 NOTE — Discharge Instructions (Signed)
As discussed, it is important that you follow up with our gastroenterology physician for continued management of your condition.  If you develop any new, or concerning changes in your condition, please return to the emergency department immediately.   Abdominal Pain Many things can cause abdominal pain. Usually, abdominal pain is not caused by a disease and will improve without treatment. It can often be observed and treated at home. Your health care provider will do a physical exam and possibly order blood tests and X-rays to help determine the seriousness of your pain. However, in many cases, more time must pass before a clear cause of the pain can be found. Before that point, your health care provider may not know if you need more testing or further treatment. HOME CARE INSTRUCTIONS  Monitor your abdominal pain for any changes. The following actions may help to alleviate any discomfort you are experiencing:  Only take over-the-counter or prescription medicines as directed by your health care provider.  Do not take laxatives unless directed to do so by your health care provider.  Try a clear liquid diet (broth, tea, or water) as directed by your health care provider. Slowly move to a bland diet as tolerated. SEEK MEDICAL CARE IF:  You have unexplained abdominal pain.  You have abdominal pain associated with nausea or diarrhea.  You have pain when you urinate or have a bowel movement.  You experience abdominal pain that wakes you in the night.  You have abdominal pain that is worsened or improved by eating food.  You have abdominal pain that is worsened with eating fatty foods.  You have a fever. SEEK IMMEDIATE MEDICAL CARE IF:   Your pain does not go away within 2 hours.  You keep throwing up (vomiting).  Your pain is felt only in portions of the abdomen, such as the right side or the left lower portion of the abdomen.  You pass bloody or black tarry stools. MAKE SURE  YOU:  Understand these instructions.   Will watch your condition.   Will get help right away if you are not doing well or get worse.  Document Released: 12/18/2004 Document Revised: 03/15/2013 Document Reviewed: 11/17/2012 [facility] Patient Information 2015 [place], [place]. This information is not intended to replace advice given to you by your health care provider. Make sure you discuss any questions you have with your health care provider.

## 2014-11-29 NOTE — ED Notes (Signed)
Called pharmacy states sending medication now.

## 2014-11-29 NOTE — ED Notes (Signed)
Pt states that he has hx of stomach ulcers and states that they have been "acting up" for 2 days. Pt states that the pain has worsened today. Pt reports nausea.

## 2014-11-29 NOTE — ED Notes (Signed)
ED Doctor at bedside.  

## 2014-11-29 NOTE — Progress Notes (Signed)
[facility] for [facility] [phone]  Spoke to patient regarding primary care resources and the [facility] orange card. [facility] orange card application provided and explained. Resource guide and my contact information also given for any future questions or concerns. No other [facility] needs identified at this time.

## 2014-11-29 NOTE — ED Notes (Signed)
Spoke with pharmacy who stated will send medications shortly.

## 2014-11-29 NOTE — ED Provider Notes (Signed)
CSN: [id number]     Arrival date & time 11/29/14  1239 History   First MD Initiated Contact with Patient 11/29/14 1358     Chief Complaint  Patient presents with  . Abdominal Pain     (Consider location/radiation/quality/duration/timing/severity/associated sxs/prior Treatment) HPI Patient presents with concern of ongoing epigastric pain. And time of onset is unclear, but it seems as though over the past 3 or 4 days the pain has become more severe, persistent. Pain is nonradiating, focally in the epigastrium. There is associated nausea, anorexia, no vomiting, no diarrhea. No other abdominal pain, no chest pain, no dyspnea. Patient states he has a history of gastric ulcers, takes no medication currently for these. He states that his ulcerative disease is worse with stress. He is currently undergoing substantial life stress.  Past Medical History  Diagnosis Date  . Ulcer   . Gallstones   . Hemothorax   . Anemia Dx 2014   History reviewed. No pertinent past surgical history. Family History  Problem Relation Age of Onset  . Diabetes Mother   . Hypertension Mother   . Heart disease Mother     x 3   . Diabetes Other   . Heart disease Brother   . Hypertension Brother   . Cancer Neg Hx   . Hypertension Brother   . Hypertension Brother    Social History  Substance Use Topics  . Smoking status: Current Every Day Smoker -- 1.50 packs/day for 31 years    Types: Cigarettes  . Smokeless tobacco: Never Used  . Alcohol Use: No     Comment: quit 1980's     Review of Systems  Constitutional:       Per HPI, otherwise negative  HENT:       Per HPI, otherwise negative  Respiratory:       Per HPI, otherwise negative  Cardiovascular:       Per HPI, otherwise negative  Gastrointestinal: Positive for nausea and abdominal pain. Negative for vomiting.  Endocrine:       Negative aside from HPI  Genitourinary:       Neg aside from HPI   Musculoskeletal:       Per HPI, otherwise  negative  Skin: Negative.   Neurological: Negative for syncope.      Allergies  Aspirin  Home Medications   Prior to Admission medications   Medication Sig Start Date End Date Taking? Authorizing Provider  buPROPion (WELLBUTRIN SR) 150 MG 12 hr tablet Take 1 tablet (150 mg total) by mouth 2 (two) times daily. Patient not taking: Reported on 04/25/2014 01/30/14   Boykin Nearing, MD  famotidine (PEPCID) 20 MG tablet Take 1 tablet (20 mg total) by mouth 2 (two) times daily. 04/25/14   Virgel Manifold, MD  omeprazole (PRILOSEC) 20 MG capsule Take 1 capsule (20 mg total) by mouth daily. 04/25/14   Virgel Manifold, MD  potassium chloride SA (K-DUR,KLOR-CON) 20 MEQ tablet Take 2 tablets (40 mEq total) by mouth daily. Patient not taking: Reported on 04/25/2014 02/01/14   Boykin Nearing, MD  traMADol (ULTRAM) 50 MG tablet Take 1 tablet (50 mg total) by mouth every 6 (six) hours as needed. 04/25/14   Virgel Manifold, MD   BP 151/93 mmHg  Pulse 60  Temp(Src) 97.8 F (36.6 C) (Oral)  Resp 16  Ht 5\' 9"  (1.753 m)  Wt 160 lb (72.576 kg)  BMI 23.62 kg/m2  SpO2 100% Physical Exam  Constitutional: He is oriented to person, place, and time.  He appears well-developed. No distress.  HENT:  Head: Normocephalic and atraumatic.  Eyes: Conjunctivae and EOM are normal.  Cardiovascular: Normal rate and regular rhythm.   Pulmonary/Chest: Effort normal. No stridor. No respiratory distress.  Abdominal: He exhibits no distension. There is tenderness in the epigastric area. There is no rigidity and no guarding.  Musculoskeletal: He exhibits no edema.  Neurological: He is alert and oriented to person, place, and time.  Skin: Skin is warm and dry.  Psychiatric: He has a normal mood and affect.  Nursing note and vitals reviewed.   ED Course  Procedures (including critical care time) Labs Review Labs Reviewed  COMPREHENSIVE METABOLIC PANEL - Abnormal; Notable for the following:    Calcium 8.6 (*)    Total Protein  5.9 (*)    Albumin 3.3 (*)    ALT 13 (*)    All other components within normal limits  CBC - Abnormal; Notable for the following:    RBC 4.20 (*)    Hemoglobin 10.8 (*)    HCT 33.7 (*)    MCH 25.7 (*)    All other components within normal limits  URINALYSIS, ROUTINE W REFLEX MICROSCOPIC (NOT AT [facility]) - Abnormal; Notable for the following:    Color, Urine AMBER (*)    APPearance CLOUDY (*)    Bilirubin Urine SMALL (*)    Leukocytes, UA MODERATE (*)    All other components within normal limits  URINE MICROSCOPIC-ADD ON - Abnormal; Notable for the following:    Crystals CA OXALATE CRYSTALS (*)    All other components within normal limits  LIPASE, BLOOD    Following provision of GI cocktail, Carafate, fentanyl, the patient was sleeping.   MDM  Vision presents with epigastric pain. Here the patient is awake and alert, interacting appropriately. Given the patient's description of gastric ulcer pathology, denial of chest pain, there is low suspicion for atypical ACS. Patient's symptoms resolved with oral medication, fentanyl. No evidence for respiratory, otherwise, bacteremia, sepsis. No evidence for perforation. Patient discharged in stable condition with anti acids, will follow-up with gastroenterology.   Carmin Muskrat, MD 11/29/14 [phone]

## 2015-03-20 ENCOUNTER — Encounter (HOSPITAL_COMMUNITY): Payer: Self-pay

## 2015-03-20 ENCOUNTER — Emergency Department (HOSPITAL_COMMUNITY)
Admission: EM | Admit: 2015-03-20 | Discharge: 2015-03-20 | Disposition: A | Discharge status: Home / Self Care | Payer: Self-pay | Attending: Emergency Medicine | Admitting: Emergency Medicine

## 2015-03-20 DIAGNOSIS — R1013 Epigastric pain: Secondary | ICD-10-CM

## 2015-03-20 DIAGNOSIS — Z79899 Other long term (current) drug therapy: Secondary | ICD-10-CM | POA: Insufficient documentation

## 2015-03-20 DIAGNOSIS — Z8709 Personal history of other diseases of the respiratory system: Secondary | ICD-10-CM | POA: Insufficient documentation

## 2015-03-20 DIAGNOSIS — Z8719 Personal history of other diseases of the digestive system: Secondary | ICD-10-CM | POA: Insufficient documentation

## 2015-03-20 DIAGNOSIS — F17210 Nicotine dependence, cigarettes, uncomplicated: Secondary | ICD-10-CM | POA: Insufficient documentation

## 2015-03-20 DIAGNOSIS — Z862 Personal history of diseases of the blood and blood-forming organs and certain disorders involving the immune mechanism: Secondary | ICD-10-CM | POA: Insufficient documentation

## 2015-03-20 LAB — URINALYSIS, ROUTINE W REFLEX MICROSCOPIC
BILIRUBIN URINE: NEGATIVE
Glucose, UA: NEGATIVE mg/dL
Hgb urine dipstick: NEGATIVE
Ketones, ur: NEGATIVE mg/dL
NITRITE: NEGATIVE
PH: 6 (ref 5.0–8.0)
Protein, ur: NEGATIVE mg/dL
SPECIFIC GRAVITY, URINE: 1.016 (ref 1.005–1.030)

## 2015-03-20 LAB — COMPREHENSIVE METABOLIC PANEL WITH GFR
ALT: 10 U/L — ABNORMAL LOW (ref 17–63)
AST: 25 U/L (ref 15–41)
Albumin: 3.3 g/dL — ABNORMAL LOW (ref 3.5–5.0)
Alkaline Phosphatase: 79 U/L (ref 38–126)
Anion gap: 8 (ref 5–15)
BUN: 16 mg/dL (ref 6–20)
CO2: 22 mmol/L (ref 22–32)
Calcium: 8.8 mg/dL — ABNORMAL LOW (ref 8.9–10.3)
Chloride: 107 mmol/L (ref 101–111)
Creatinine, Ser: 0.95 mg/dL (ref 0.61–1.24)
GFR calc Af Amer: >60 mL/min
GFR calc non Af Amer: >60 mL/min
Glucose, Bld: 89 mg/dL (ref 65–99)
Potassium: 4.3 mmol/L (ref 3.5–5.1)
Sodium: 137 mmol/L (ref 135–145)
Total Bilirubin: 0.6 mg/dL (ref 0.3–1.2)
Total Protein: 6.3 g/dL — ABNORMAL LOW (ref 6.5–8.1)

## 2015-03-20 LAB — CBC
HEMATOCRIT: 32.9 % — AB (ref 39.0–52.0)
HEMOGLOBIN: 10.6 g/dL — AB (ref 13.0–17.0)
MCH: 25.6 pg — ABNORMAL LOW (ref 26.0–34.0)
MCHC: 32.2 g/dL (ref 30.0–36.0)
MCV: 79.5 fL (ref 78.0–100.0)
Platelets: 268 10*3/uL (ref 150–400)
RBC: 4.14 MIL/uL — ABNORMAL LOW (ref 4.22–5.81)
RDW: 12.7 % (ref 11.5–15.5)
WBC: 5.4 10*3/uL (ref 4.0–10.5)

## 2015-03-20 LAB — URINE MICROSCOPIC-ADD ON: RBC / HPF: NONE SEEN RBC/hpf (ref 0–5)

## 2015-03-20 LAB — LIPASE, BLOOD: LIPASE: 38 U/L (ref 11–51)

## 2015-03-20 MED ORDER — FAMOTIDINE 20 MG PO TABS
20.0000 mg | ORAL_TABLET | Freq: Once | ORAL | Status: AC
  Administered 2015-03-20: 20 mg via ORAL
  Filled 2015-03-20: qty 1

## 2015-03-20 MED ORDER — FAMOTIDINE 20 MG PO TABS: 20.0000 mg | ORAL_TABLET | Freq: Two times a day (BID) | ORAL | Status: DC

## 2015-03-20 MED ORDER — GI COCKTAIL ~~LOC~~
30.0000 mL | Freq: Once | ORAL | Status: AC
Start: 2015-03-20 — End: 2015-03-20
  Administered 2015-03-20: 30 mL via ORAL
  Filled 2015-03-20: qty 30

## 2015-03-20 NOTE — Discharge Instructions (Signed)

## 2015-03-20 NOTE — ED Notes (Signed)
Lab at the bedside 

## 2015-03-20 NOTE — ED Provider Notes (Signed)
CSN: [id number]     Arrival date & time 03/20/15  0409 History   First MD Initiated Contact with Patient 03/20/15 [phone]     Chief Complaint  Patient presents with  . Abdominal Pain    Patient is a 49 y.o. male presenting with abdominal pain. The history is provided by the patient.  Abdominal Pain Pain location:  Epigastric Pain quality: sharp and squeezing   Pain radiates to:  Does not radiate Pain severity:  Moderate Onset quality:  Gradual Duration:  3 days Timing:  Intermittent Chronicity:  Recurrent (Pt has history of ulcers.  He feels this is like those previous episodes) Relieved by: he usually pats on his stomach to make it better. Worsened by:  Eating Associated symptoms: no anorexia, no diarrhea, no fever, no hematuria and no vomiting     Past Medical History  Diagnosis Date  . Ulcer   . Gallstones   . Hemothorax   . Anemia Dx 2014   History reviewed. No pertinent past surgical history. Family History  Problem Relation Age of Onset  . Diabetes Mother   . Hypertension Mother   . Heart disease Mother     x 3   . Diabetes Other   . Heart disease Brother   . Hypertension Brother   . Cancer Neg Hx   . Hypertension Brother   . Hypertension Brother    Social History  Substance Use Topics  . Smoking status: Current Every Day Smoker -- 1.50 packs/day for 31 years    Types: Cigarettes  . Smokeless tobacco: Never Used  . Alcohol Use: No     Comment: quit 1980's     Review of Systems  Constitutional: Negative for fever.  Gastrointestinal: Positive for abdominal pain. Negative for vomiting, diarrhea, blood in stool and anorexia.  Genitourinary: Negative for hematuria.  All other systems reviewed and are negative.     Allergies  Aspirin  Home Medications   Prior to Admission medications   Medication Sig Start Date End Date Taking? Authorizing Provider  FLUoxetine (PROZAC) 20 MG capsule Take 20 mg by mouth daily.   Yes Historical Provider, MD  famotidine  (PEPCID) 20 MG tablet Take 1 tablet (20 mg total) by mouth 2 (two) times daily. 03/20/15   Dorie Rank, MD   BP 135/90 mmHg  Pulse 73  Temp(Src) 97.5 F (36.4 C) (Oral)  Resp 20  Ht 5\' 9"  (1.753 m)  Wt 65.772 kg  BMI 21.40 kg/m2  SpO2 100% Physical Exam  Constitutional: He appears well-developed and well-nourished. No distress.  HENT:  Head: Normocephalic and atraumatic.  Right Ear: External ear normal.  Left Ear: External ear normal.  Eyes: Conjunctivae are normal. Right eye exhibits no discharge. Left eye exhibits no discharge. No scleral icterus.  Neck: Neck supple. No tracheal deviation present.  Cardiovascular: Normal rate, regular rhythm and intact distal pulses.   Pulmonary/Chest: Effort normal and breath sounds normal. No stridor. No respiratory distress. He has no wheezes. He has no rales.  Abdominal: Soft. Bowel sounds are normal. He exhibits no distension. There is no tenderness. There is no rebound and no guarding.  Musculoskeletal: He exhibits no edema or tenderness.  Neurological: He is alert. He has normal strength. No cranial nerve deficit (no facial droop, extraocular movements intact, no slurred speech) or sensory deficit. He exhibits normal muscle tone. He displays no seizure activity. Coordination normal.  Skin: Skin is warm and dry. No rash noted.  Psychiatric: He has a normal mood  and affect.  Nursing note and vitals reviewed.   ED Course  Procedures (including critical care time) Labs Review Labs Reviewed  COMPREHENSIVE METABOLIC PANEL - Abnormal; Notable for the following:    Calcium 8.8 (*)    Total Protein 6.3 (*)    Albumin 3.3 (*)    ALT 10 (*)    All other components within normal limits  CBC - Abnormal; Notable for the following:    RBC 4.14 (*)    Hemoglobin 10.6 (*)    HCT 32.9 (*)    MCH 25.6 (*)    All other components within normal limits  URINALYSIS, ROUTINE W REFLEX MICROSCOPIC (NOT AT [facility]) - Abnormal; Notable for the following:     APPearance CLOUDY (*)    Leukocytes, UA SMALL (*)    All other components within normal limits  URINE MICROSCOPIC-ADD ON - Abnormal; Notable for the following:    Squamous Epithelial / LPF 0-5 (*)    Bacteria, UA RARE (*)    All other components within normal limits  LIPASE, BLOOD     MDM   Final diagnoses:  Epigastric pain    Patient's symptoms are suggestive of an exacerbation of his ulcers. Patient is having upper abdominal pain. He denies any nausea vomiting. No fevers. No change in his appetite.  Report has reassuring.  He does have an asymptomatic anemia but his hemoglobin is stable compared to prior results. He denies any blood in his stool.  Plan on discharge home with antacids. At this time there does not appear to be any evidence of an acute emergency medical condition and the patient appears stable for discharge with appropriate outpatient follow up.    Dorie Rank, MD 03/20/15 [phone]

## 2015-03-20 NOTE — ED Notes (Signed)
Pt has an ulcer and it's been bothering him the last three days. States he feels dizzy when he changes position also that started the last three days as well. Denies any nausea or vomiting.

## 2015-06-09 ENCOUNTER — Emergency Department (HOSPITAL_COMMUNITY)
Admission: EM | Admit: 2015-06-09 | Discharge: 2015-06-09 | Disposition: A | Discharge status: Home / Self Care | Payer: Self-pay | Attending: Emergency Medicine | Admitting: Emergency Medicine

## 2015-06-09 ENCOUNTER — Encounter (HOSPITAL_COMMUNITY): Payer: Self-pay | Admitting: *Deleted

## 2015-06-09 DIAGNOSIS — Z8709 Personal history of other diseases of the respiratory system: Secondary | ICD-10-CM | POA: Insufficient documentation

## 2015-06-09 DIAGNOSIS — M791 Myalgia: Secondary | ICD-10-CM | POA: Insufficient documentation

## 2015-06-09 DIAGNOSIS — Z79899 Other long term (current) drug therapy: Secondary | ICD-10-CM | POA: Insufficient documentation

## 2015-06-09 DIAGNOSIS — L0211 Cutaneous abscess of neck: Secondary | ICD-10-CM | POA: Insufficient documentation

## 2015-06-09 DIAGNOSIS — Z8719 Personal history of other diseases of the digestive system: Secondary | ICD-10-CM | POA: Insufficient documentation

## 2015-06-09 DIAGNOSIS — Z862 Personal history of diseases of the blood and blood-forming organs and certain disorders involving the immune mechanism: Secondary | ICD-10-CM | POA: Insufficient documentation

## 2015-06-09 DIAGNOSIS — F17210 Nicotine dependence, cigarettes, uncomplicated: Secondary | ICD-10-CM | POA: Insufficient documentation

## 2015-06-09 MED ORDER — DOXYCYCLINE HYCLATE 100 MG PO CAPS: 100.0000 mg | ORAL_CAPSULE | Freq: Two times a day (BID) | ORAL | Status: DC

## 2015-06-09 MED ORDER — DOXYCYCLINE HYCLATE 100 MG PO TABS
100.0000 mg | ORAL_TABLET | Freq: Once | ORAL | Status: AC
  Administered 2015-06-09: 100 mg via ORAL
  Filled 2015-06-09: qty 1

## 2015-06-09 NOTE — ED Notes (Signed)
Patient verbalized understanding of discharge instructions and denies any further needs or questions at this time. VS stable. Patient ambulatory with steady gait.  

## 2015-06-09 NOTE — ED Notes (Signed)
The pt has a boil just under his rt ear for  One week.  He thinks he was bitten by a spider one week ago.  No temp

## 2015-06-09 NOTE — ED Provider Notes (Signed)
CSN: [id number]     Arrival date & time 06/09/15  2140 History  By signing my name below, I, Rayna Sexton, attest that this documentation has been prepared under the direction and in the presence of [employer], PA-C. Electronically Signed: Rayna Sexton, ED Scribe. 06/09/2015. 11:18 PM.    Chief Complaint  Patient presents with  . Abscess   The history is provided by the patient. No language interpreter was used.   HPI Comments: Marvin Thomas is a 50 y.o. male who presents to the Emergency Department complaining of a worsening point of pain and swelling inferior to his right ear onset 10 days ago. Pt believes he was bitten by a spider but is unsure noting a hx of similar symptoms many years ago. His pain worsens with palpation of the region. He denies taking any medications for pain management. Pt denies any other known health conditions. He confirms his listed allergies. Pt denies a hx of IVDA. He denies fevers, chills or any other associated symptoms at this time.    Past Medical History  Diagnosis Date  . Ulcer   . Gallstones   . Hemothorax   . Anemia Dx 2014   No past surgical history on file. Family History  Problem Relation Age of Onset  . Diabetes Mother   . Hypertension Mother   . Heart disease Mother     x 3   . Diabetes Other   . Heart disease Brother   . Hypertension Brother   . Cancer Neg Hx   . Hypertension Brother   . Hypertension Brother    Social History  Substance Use Topics  . Smoking status: Current Every Day Smoker -- 1.50 packs/day for 31 years    Types: Cigarettes  . Smokeless tobacco: Never Used  . Alcohol Use: No     Comment: quit 1980's     Review of Systems  Constitutional: Negative for fever and chills.  Musculoskeletal: Positive for myalgias and neck pain.  Skin: Positive for color change and rash.    Allergies  Aspirin  Home Medications   Prior to Admission medications   Medication Sig Start Date End Date Taking?  Authorizing Provider  doxycycline (VIBRAMYCIN) 100 MG capsule Take 1 capsule (100 mg total) by mouth 2 (two) times daily. 06/09/15    Patel-Mills, PA-C  famotidine (PEPCID) 20 MG tablet Take 1 tablet (20 mg total) by mouth 2 (two) times daily. 03/20/15   Dorie Rank, MD  FLUoxetine (PROZAC) 20 MG capsule Take 20 mg by mouth daily.    Historical Provider, MD   BP 109/77 mmHg  Pulse 79  Temp(Src) 98.2 F (36.8 C) (Oral)  Resp 16  SpO2 99%    Physical Exam  Constitutional: He is oriented to person, place, and time. He appears well-developed and well-nourished.  HENT:  Head: Normocephalic and atraumatic.  Eyes: EOM are normal.  Neck: Normal range of motion.    2 cm x 2 cm indurated and elevated abscess on the right side of the neck. No fluctuance or drainage. Tender to touch but without erythema or streaking. No anterior cervical lymphadenopathy.  Cardiovascular: Normal rate.   Pulmonary/Chest: Effort normal. No respiratory distress.  Musculoskeletal: Normal range of motion.  Neurological: He is alert and oriented to person, place, and time.  Skin: Skin is warm and dry.  Psychiatric: He has a normal mood and affect.  Nursing note and vitals reviewed.   ED Course  Procedures  DIAGNOSTIC STUDIES: Oxygen Saturation is  98% on RA, normal by my interpretation.    COORDINATION OF CARE: 11:16 PM Discussed next steps with pt. He verbalized understanding and is agreeable with the plan.   Labs Review Labs Reviewed - No data to display  Imaging Review No results found.   EKG Interpretation None      MDM   Final diagnoses:  Abscess, neck  Patient presents for abscess the right side of his neck. The abscess is indurated and cannot be drained at this time. He is afebrile well-appearing. He is not immunocompromised. I discussed applying warm compresses to the area several times a day. Patient was put on antibiotics. I discussed return precautions with the patient. He agrees with  plan. Medications  doxycycline (VIBRA-TABS) tablet 100 mg (100 mg Oral Given 06/09/15 2329)   Filed Vitals:   06/09/15 2158 06/09/15 2331  BP: 104/67 109/77  Pulse: 77 79  Temp: 98 F (36.7 C) 98.2 F (36.8 C)  Resp: 16 16   I personally performed the services described in this documentation, which was scribed in my presence. The recorded information has been reviewed and is accurate.    Ottie Glazier, PA-C 06/10/15 [id number]  Fredia Sorrow, MD 06/10/15 [phone]

## 2015-06-09 NOTE — Discharge Instructions (Signed)
Abscess Apply warm compresses to the area several times a day. Take antibiotics as prescribed.  Return for fever, increased size, or redness. Follow up with your primary care doctor. An abscess (boil or furuncle) is an infected area on or under the skin. This area is filled with yellowish-white fluid (pus) and other material (debris). HOME CARE   Only take medicines as told by your doctor.  If you were given antibiotic medicine, take it as directed. Finish the medicine even if you start to feel better.  If gauze is used, follow your doctor's directions for changing the gauze.  To avoid spreading the infection:  Keep your abscess covered with a bandage.  Wash your hands well.  Do not share personal care items, towels, or whirlpools with others.  Avoid skin contact with others.  Keep your skin and clothes clean around the abscess.  Keep all doctor visits as told. GET HELP RIGHT AWAY IF:   You have more pain, puffiness (swelling), or redness in the wound site.  You have more fluid or blood coming from the wound site.  You have muscle aches, chills, or you feel sick.  You have a fever. MAKE SURE YOU:   Understand these instructions.  Will watch your condition.  Will get help right away if you are not doing well or get worse.   This information is not intended to replace advice given to you by your health care provider. Make sure you discuss any questions you have with your health care provider.   Document Released: 08/27/2007 Document Revised: 09/09/2011 Document Reviewed: 05/24/2011 Elsevier Interactive Patient Education [employer].

## 2015-06-14 ENCOUNTER — Emergency Department (HOSPITAL_COMMUNITY)
Admission: EM | Admit: 2015-06-14 | Discharge: 2015-06-14 | Disposition: A | Discharge status: Home / Self Care | Payer: Self-pay | Attending: Emergency Medicine | Admitting: Emergency Medicine

## 2015-06-14 ENCOUNTER — Encounter (HOSPITAL_COMMUNITY): Payer: Self-pay | Admitting: Cardiology

## 2015-06-14 DIAGNOSIS — Z79899 Other long term (current) drug therapy: Secondary | ICD-10-CM | POA: Insufficient documentation

## 2015-06-14 DIAGNOSIS — F17210 Nicotine dependence, cigarettes, uncomplicated: Secondary | ICD-10-CM | POA: Insufficient documentation

## 2015-06-14 DIAGNOSIS — Z8719 Personal history of other diseases of the digestive system: Secondary | ICD-10-CM | POA: Insufficient documentation

## 2015-06-14 DIAGNOSIS — Z8709 Personal history of other diseases of the respiratory system: Secondary | ICD-10-CM | POA: Insufficient documentation

## 2015-06-14 DIAGNOSIS — L0211 Cutaneous abscess of neck: Secondary | ICD-10-CM

## 2015-06-14 DIAGNOSIS — Z862 Personal history of diseases of the blood and blood-forming organs and certain disorders involving the immune mechanism: Secondary | ICD-10-CM | POA: Insufficient documentation

## 2015-06-14 DIAGNOSIS — Z792 Long term (current) use of antibiotics: Secondary | ICD-10-CM | POA: Insufficient documentation

## 2015-06-14 MED ORDER — LIDOCAINE-PRILOCAINE 2.5-2.5 % EX CREA
TOPICAL_CREAM | Freq: Once | CUTANEOUS | Status: AC
  Administered 2015-06-14: 17:00:00 via TOPICAL
  Filled 2015-06-14 (×2): qty 5

## 2015-06-14 MED ORDER — LIDOCAINE-EPINEPHRINE (PF) 2 %-1:200000 IJ SOLN
10.0000 mL | Freq: Once | INTRAMUSCULAR | Status: AC
  Administered 2015-06-14: 10 mL
  Filled 2015-06-14: qty 20

## 2015-06-14 NOTE — ED Notes (Signed)
Pt here with abscess to right side of neck approximately 2.5 cm in diameter.  Pt advises he was seen for same last week, given a Rx for antibiotics, and told to come back if the abscess got any bigger.  Pt states he is going to get the antibiotics filled tomorrow, but now the abscess has grown bigger.

## 2015-06-14 NOTE — ED Notes (Signed)
Pt A&OX4, ambulatory at d/c with steady gait, NAD 

## 2015-06-14 NOTE — ED Provider Notes (Signed)
CSN: [id number]     Arrival date & time 06/14/15  1214 History   First MD Initiated Contact with Patient 06/14/15 1506     Chief Complaint  Patient presents with  . Abscess     (Consider location/radiation/quality/duration/timing/severity/associated sxs/prior Treatment) HPI   Marvin Thomas is a 50 year old male who presents the ED complaining of abscess to his neck. Patient states that he noticed a boil as inferior to his right ear over 2 weeks ago. He was seen in the ED on 3/18 for the same symptoms and was prescribed antibiotics which she has not taken. He states at that time he has gotten much larger and more painful. The abscess was not incised and drained at his last ED visit. Patient denies fevers, chills, trismus, otalgia.   Past Medical History  Diagnosis Date  . Ulcer   . Gallstones   . Hemothorax   . Anemia Dx 2014   History reviewed. No pertinent past surgical history. Family History  Problem Relation Age of Onset  . Diabetes Mother   . Hypertension Mother   . Heart disease Mother     x 3   . Diabetes Other   . Heart disease Brother   . Hypertension Brother   . Cancer Neg Hx   . Hypertension Brother   . Hypertension Brother    Social History  Substance Use Topics  . Smoking status: Current Every Day Smoker -- 1.50 packs/day for 31 years    Types: Cigarettes  . Smokeless tobacco: Never Used  . Alcohol Use: No     Comment: quit 1980's     Review of Systems  All other systems reviewed and are negative.     Allergies  Aspirin  Home Medications   Prior to Admission medications   Medication Sig Start Date End Date Taking? Authorizing Provider  doxycycline (VIBRAMYCIN) 100 MG capsule Take 1 capsule (100 mg total) by mouth 2 (two) times daily. 06/09/15   Hanna Patel-Mills, PA-C  famotidine (PEPCID) 20 MG tablet Take 1 tablet (20 mg total) by mouth 2 (two) times daily. 03/20/15   Dorie Rank, MD  FLUoxetine (PROZAC) 20 MG capsule Take 20 mg by mouth  daily.    Historical Provider, MD   BP 107/61 mmHg  Pulse 53  Temp(Src) 97.8 F (36.6 C) (Oral)  Resp 20  SpO2 99% Physical Exam  Constitutional: He is oriented to person, place, and time. He appears well-developed and well-nourished. No distress.  HENT:  Head: Normocephalic and atraumatic.  No mastoid or mandibular tenderness.  Eyes: Conjunctivae are normal. Right eye exhibits no discharge. Left eye exhibits no discharge. No scleral icterus.  Neck: Normal range of motion. Neck supple.  Cardiovascular: Normal rate.   Pulmonary/Chest: Effort normal.  Lymphadenopathy:    He has no cervical adenopathy.  Neurological: He is alert and oriented to person, place, and time. Coordination normal.  Skin: Skin is warm and dry. No rash noted. He is not diaphoretic. No erythema. No pallor.  2.5 cm fluctuant abscess inferior to right ear on right side of neck. No active drainage. Mild surrounding erythema. No streaking.  Psychiatric: He has a normal mood and affect. His behavior is normal.  Nursing note and vitals reviewed.   ED Course  Procedures (including critical care time)  INCISION AND DRAINAGE Performed by: Carlos Levering Consent: Verbal consent obtained. Risks and benefits: risks, benefits and alternatives were discussed Type: abscess  Body area: right side of neck  Anesthesia: local infiltration  Incision was made with a scalpel.  Local anesthetic: lidocaine 2% with epinephrine and EMLA cream  Anesthetic total: 3 ml  Complexity: complex Blunt dissection to break up loculations  Drainage: purulent  Drainage amount: copious   Patient tolerance: Patient tolerated the procedure well with no immediate complications.    Labs Review Labs Reviewed - No data to display  Imaging Review No results found. I have personally reviewed and evaluated these images and lab results as part of my medical decision-making.   EKG Interpretation None      MDM   Final  diagnoses:  Abscess of neck    Patient with skin abscess amenable to incision and drainage.  Abscess was not large enough to warrant packing or drain,  wound recheck in 2 days. Encouraged home warm soaks and flushing.  Mild signs of cellulitis is surrounding skin. Pt has home antibiotics that were given to him at last discharge for same symptoms that he has not gotten filled. Pt will pick this prescription up today. Discussed importance of taking abx. Will d/c to home.  Patient was discussed with and seen by Dr. Lenoard Aden agrees with the treatment plan.    Dondra Spry [place], PA-C 06/14/15 1833  Veryl Speak, MD 06/15/15 [phone]

## 2015-06-14 NOTE — ED Notes (Signed)
Pt to room E46 ambulatory without problems

## 2015-06-14 NOTE — ED Notes (Signed)
Pt reports an abscess on the right side of his neck that he developed about 2 weeks ago. Was given oral antibiotics but the area is now bigger.

## 2015-06-14 NOTE — Discharge Instructions (Signed)
Abscess °An abscess is an infected area that contains a collection of pus and debris. It can occur in almost any part of the body. An abscess is also known as a furuncle or boil. °CAUSES  °An abscess occurs when tissue gets infected. This can occur from blockage of oil or sweat glands, infection of hair follicles, or a minor injury to the skin. As the body tries to fight the infection, pus collects in the area and creates pressure under the skin. This pressure causes pain. People with weakened immune systems have difficulty fighting infections and get certain abscesses more often.  °SYMPTOMS °Usually an abscess develops on the skin and becomes a painful mass that is red, warm, and tender. If the abscess forms under the skin, you may feel a moveable soft area under the skin. Some abscesses break open (rupture) on their own, but most will continue to get worse without care. The infection can spread deeper into the body and eventually into the bloodstream, causing you to feel ill.  °DIAGNOSIS  °Your caregiver will take your medical history and perform a physical exam. A sample of fluid may also be taken from the abscess to determine what is causing your infection. °TREATMENT  °Your caregiver may prescribe antibiotic medicines to fight the infection. However, taking antibiotics alone usually does not cure an abscess. Your caregiver may need to make a small cut (incision) in the abscess to drain the pus. In some cases, gauze is packed into the abscess to reduce pain and to continue draining the area. °HOME CARE INSTRUCTIONS  °· Only take over-the-counter or prescription medicines for pain, discomfort, or fever as directed by your caregiver. °· If you were prescribed antibiotics, take them as directed. Finish them even if you start to feel better. °· If gauze is used, follow your caregiver's directions for changing the gauze. °· To avoid spreading the infection: °· Keep your draining abscess covered with a  bandage. °· Wash your hands well. °· Do not share personal care items, towels, or whirlpools with others. °· Avoid skin contact with others. °· Keep your skin and clothes clean around the abscess. °· Keep all follow-up appointments as directed by your caregiver. °SEEK MEDICAL CARE IF:  °· You have increased pain, swelling, redness, fluid drainage, or bleeding. °· You have muscle aches, chills, or a general ill feeling. °· You have a fever. °MAKE SURE YOU:  °· Understand these instructions. °· Will watch your condition. °· Will get help right away if you are not doing well or get worse. °  °This information is not intended to replace advice given to you by your health care provider. Make sure you discuss any questions you have with your health care provider. °  °Document Released: 12/18/2004 Document Revised: 09/09/2011 Document Reviewed: 05/23/2011 °Elsevier Interactive Patient Education ©2016 Else[employer] Inc[employer] ° °Incision and Drainage °Incision and drainage is a procedure in which a sac-like structure (cystic structure) is opened and drained. The area to be drained usually contains material such as pus, fluid, or blood.  °LET YOUR CAREGIVER KNOW ABOUT:  °· Allergies to medicine. °· Medicines taken, including vitamins, herbs, eyedrops, over-the-counter medicines, and creams. °· Use of steroids (by mouth or creams). °· Previous problems with anesthetics or numbing medicines. °· History of bleeding problems or blood clots. °· Previous surgery. °· Other health problems, including diabetes and kidney problems. °· Possibility of pregnancy, if this applies. °RISKS AND COMPLICATIONS °· Pain. °· Bleeding. °· Scarring. °· Infection. °BEFORE THE PROCEDURE  °  You may need to have an ultrasound or other imaging tests to see how large or deep your cystic structure is. Blood tests may also be used to determine if you have an infection or how severe the infection is. You may need to have a tetanus shot. PROCEDURE  The affected area  is cleaned with a cleaning fluid. The cyst area will then be numbed with a medicine (local anesthetic). A small incision will be made in the cystic structure. A syringe or catheter may be used to drain the contents of the cystic structure, or the contents may be squeezed out. The area will then be flushed with a cleansing solution. After cleansing the area, it is often gently packed with a gauze or another wound dressing. Once it is packed, it will be covered with gauze and tape or some other type of wound dressing. AFTER THE PROCEDURE   Often, you will be allowed to go home right after the procedure.  You may be given antibiotic medicine to prevent or heal an infection.  If the area was packed with gauze or some other wound dressing, you will likely need to come back in 1 to 2 days to get it removed.  The area should heal in about 14 days.   This information is not intended to replace advice given to you by your health care provider. Make sure you discuss any questions you have with your health care provider.  Keep wound clean and dry. May wash with soap and water. Take antibiotics as prescribed. Follow up with your PCP next week for wound recheck. Return to the emergency department a few experience severe worsening of your symptoms, redness or swelling around your wound, fever, chills.

## 2015-07-23 ENCOUNTER — Encounter (HOSPITAL_COMMUNITY): Payer: Self-pay | Admitting: *Deleted

## 2015-07-23 ENCOUNTER — Emergency Department (HOSPITAL_COMMUNITY)
Admission: EM | Admit: 2015-07-23 | Discharge: 2015-07-23 | Disposition: A | Discharge status: Home / Self Care | Payer: Self-pay | Attending: Emergency Medicine | Admitting: Emergency Medicine

## 2015-07-23 DIAGNOSIS — F17210 Nicotine dependence, cigarettes, uncomplicated: Secondary | ICD-10-CM | POA: Insufficient documentation

## 2015-07-23 DIAGNOSIS — Z79899 Other long term (current) drug therapy: Secondary | ICD-10-CM | POA: Insufficient documentation

## 2015-07-23 DIAGNOSIS — R1013 Epigastric pain: Secondary | ICD-10-CM | POA: Insufficient documentation

## 2015-07-23 DIAGNOSIS — Z862 Personal history of diseases of the blood and blood-forming organs and certain disorders involving the immune mechanism: Secondary | ICD-10-CM | POA: Insufficient documentation

## 2015-07-23 LAB — CBC
HEMATOCRIT: 36.1 % — AB (ref 39.0–52.0)
HEMOGLOBIN: 12.2 g/dL — AB (ref 13.0–17.0)
MCH: 25.8 pg — ABNORMAL LOW (ref 26.0–34.0)
MCHC: 33.8 g/dL (ref 30.0–36.0)
MCV: 76.5 fL — AB (ref 78.0–100.0)
Platelets: 271 10*3/uL (ref 150–400)
RBC: 4.72 MIL/uL (ref 4.22–5.81)
RDW: 12.6 % (ref 11.5–15.5)
WBC: 7.5 10*3/uL (ref 4.0–10.5)

## 2015-07-23 LAB — COMPREHENSIVE METABOLIC PANEL
ALBUMIN: 3.6 g/dL (ref 3.5–5.0)
ALT: 14 U/L — ABNORMAL LOW (ref 17–63)
ANION GAP: 8 (ref 5–15)
AST: 21 U/L (ref 15–41)
Alkaline Phosphatase: 74 U/L (ref 38–126)
BUN: 14 mg/dL (ref 6–20)
CHLORIDE: 105 mmol/L (ref 101–111)
CO2: 23 mmol/L (ref 22–32)
Calcium: 9 mg/dL (ref 8.9–10.3)
Creatinine, Ser: 0.84 mg/dL (ref 0.61–1.24)
GFR calc Af Amer: >60 mL/min (ref >60)
GFR calc non Af Amer: >60 mL/min (ref >60)
GLUCOSE: 104 mg/dL — AB (ref 65–99)
POTASSIUM: 3.7 mmol/L (ref 3.5–5.1)
SODIUM: 136 mmol/L (ref 135–145)
TOTAL PROTEIN: 6.6 g/dL (ref 6.5–8.1)
Total Bilirubin: 0.5 mg/dL (ref 0.3–1.2)

## 2015-07-23 LAB — URINALYSIS, ROUTINE W REFLEX MICROSCOPIC
GLUCOSE, UA: NEGATIVE mg/dL
Hgb urine dipstick: NEGATIVE
Ketones, ur: NEGATIVE mg/dL
NITRITE: NEGATIVE
PH: 5.5 (ref 5.0–8.0)
Protein, ur: NEGATIVE mg/dL
SPECIFIC GRAVITY, URINE: 1.027 (ref 1.005–1.030)

## 2015-07-23 LAB — URINE MICROSCOPIC-ADD ON

## 2015-07-23 LAB — I-STAT TROPONIN, ED: Troponin i, poc: 0 ng/mL (ref 0.00–0.08)

## 2015-07-23 LAB — LIPASE, BLOOD: Lipase: 110 U/L — ABNORMAL HIGH (ref 11–51)

## 2015-07-23 MED ORDER — PANTOPRAZOLE SODIUM 40 MG PO TBEC
40.0000 mg | DELAYED_RELEASE_TABLET | Freq: Once | ORAL | Status: AC
  Administered 2015-07-23: 40 mg via ORAL
  Filled 2015-07-23: qty 1

## 2015-07-23 MED ORDER — GI COCKTAIL ~~LOC~~
30.0000 mL | Freq: Once | ORAL | Status: AC
Start: 2015-07-23 — End: 2015-07-23
  Administered 2015-07-23: 30 mL via ORAL
  Filled 2015-07-23: qty 30

## 2015-07-23 MED ORDER — OMEPRAZOLE 20 MG PO CPDR: 20.0000 mg | DELAYED_RELEASE_CAPSULE | Freq: Every day | ORAL | Status: DC

## 2015-07-23 MED ORDER — PANTOPRAZOLE SODIUM 40 MG IV SOLR: 40.0000 mg | Freq: Once | INTRAVENOUS | Status: DC

## 2015-07-23 NOTE — ED Notes (Signed)
Patient presents stating his ulcers are acting up.  Has not gotten prescription filled from "a while back" due to not having any money  States it burns

## 2015-07-23 NOTE — ED Provider Notes (Signed)
CSN: [id number]     Arrival date & time 07/23/15  0535 History   First MD Initiated Contact with Patient 07/23/15 0757     Chief Complaint  Patient presents with  . Abdominal Pain     (Consider location/radiation/quality/duration/timing/severity/associated sxs/prior Treatment) HPI Comments: Patient with a history of Ulcers presents today with epigastric abdominal pain.  He states that the pain has been intermittent over the past week.  He describes the pain as a burning pain.  Pain is worse after eating.  He states that the pain is better when he pushes on it.  He has taken ES Tylenol for the pain, but does not feel that it helps.  He has been seen in the ED several times for this in the past and prescribed PPI's.  However, he states that he has never gotten his prescriptions filled.  He denies any regular NSAID use.  Reports that his last alcohol use was in 1989.  He denies chest pain, SOB, nausea, vomiting, cough, fever, or chills.    Patient is a 50 y.o. male presenting with abdominal pain. The history is provided by the patient.  Abdominal Pain   Past Medical History  Diagnosis Date  . Ulcer   . Gallstones   . Hemothorax   . Anemia Dx 2014   History reviewed. No pertinent past surgical history. Family History  Problem Relation Age of Onset  . Diabetes Mother   . Hypertension Mother   . Heart disease Mother     x 3   . Diabetes Other   . Heart disease Brother   . Hypertension Brother   . Cancer Neg Hx   . Hypertension Brother   . Hypertension Brother    Social History  Substance Use Topics  . Smoking status: Current Every Day Smoker -- 1.50 packs/day for 31 years    Types: Cigarettes  . Smokeless tobacco: Never Used  . Alcohol Use: No     Comment: quit 1980's     Review of Systems  Gastrointestinal: Positive for abdominal pain.  All other systems reviewed and are negative.     Allergies  Aspirin  Home Medications   Prior to Admission medications    Medication Sig Start Date End Date Taking? Authorizing Provider  doxycycline (VIBRAMYCIN) 100 MG capsule Take 1 capsule (100 mg total) by mouth 2 (two) times daily. 06/09/15   Hanna Patel-Mills, PA-C  FLUoxetine (PROZAC) 20 MG capsule Take 20 mg by mouth daily.    Historical Provider, MD   BP 126/88 mmHg  Pulse 65  Temp(Src) 97.6 F (36.4 C) (Oral)  Resp 18  Ht 5\' 10"  (1.778 m)  Wt 70.308 kg  BMI 22.24 kg/m2  SpO2 99% Physical Exam  Constitutional: He appears well-developed and well-nourished.  HENT:  Head: Normocephalic and atraumatic.  Mouth/Throat: Oropharynx is clear and moist.  Neck: Normal range of motion. Neck supple.  Cardiovascular: Normal rate, regular rhythm and normal heart sounds.   Pulmonary/Chest: Effort normal and breath sounds normal. gm Abdominal: Soft. Bowel sounds are normal. He exhibits no distension and no mass. There is no tenderness. There is no rebound and no guarding.  Neurological: He is alert.  Skin: Skin is warm and dry.  Psychiatric: He has a normal mood and affect.  Nursing note and vitals reviewed.   ED Course  Procedures (including critical care time) Labs Review Labs Reviewed  LIPASE, BLOOD - Abnormal; Notable for the following:    Lipase 110 (*)  All other components within normal limits  COMPREHENSIVE METABOLIC PANEL - Abnormal; Notable for the following:    Glucose, Bld 104 (*)    ALT 14 (*)    All other components within normal limits  CBC - Abnormal; Notable for the following:    Hemoglobin 12.2 (*)    HCT 36.1 (*)    MCV 76.5 (*)    MCH 25.8 (*)    All other components within normal limits  URINALYSIS, ROUTINE W REFLEX MICROSCOPIC (NOT AT [facility])  I-STAT TROPOININ, ED    Imaging Review No results found. I have personally reviewed and evaluated these images and lab results as part of my medical decision-making.   EKG Interpretation   Date/Time:  Monday Jul 23 2015 08:24:46 EDT Ventricular Rate:  59 PR Interval:   145 QRS Duration: 85 QT Interval:  443 QTC Calculation: 439 R Axis:   96 Text Interpretation:  Sinus rhythm Borderline right axis deviation  Abnormal R-wave progression, late transition , new since last tracing  Nonspecific T abnrm, anterolateral leads ST elev, probable normal early  repol pattern , similar to prior ECG Confirmed by KNAPP  MD-J, JON ([id number])  on 07/23/2015 9:08:48 AM     9:24 AM Reassessed patient.  He reports improvement of pain at this time. MDM   Final diagnoses:  None   Patient with a history of Ulcers presents today with epigastric abdominal pain that has been intermittent over the past week.  He has been seen in the ED several times for this in the past.  On exam, abdomen is soft and nontender.  Labs unremarkable, aside from a Lipase of 110.  EKG unremarkable.  Negative troponin.  Pain improved after given Protonix and GI cocktail.  Patient discharged home with Rx for Omeprazole.  Stable for discharge.  Return precautions given.      Hyman Bible, PA-C 07/23/15 [place], MD 07/23/15 1314

## 2015-08-07 ENCOUNTER — Emergency Department (HOSPITAL_COMMUNITY)
Admission: EM | Admit: 2015-08-07 | Discharge: 2015-08-07 | Disposition: A | Discharge status: Home / Self Care | Payer: Self-pay | Attending: Emergency Medicine | Admitting: Emergency Medicine

## 2015-08-07 ENCOUNTER — Encounter (HOSPITAL_COMMUNITY): Payer: Self-pay | Admitting: Emergency Medicine

## 2015-08-07 DIAGNOSIS — Z792 Long term (current) use of antibiotics: Secondary | ICD-10-CM | POA: Insufficient documentation

## 2015-08-07 DIAGNOSIS — D509 Iron deficiency anemia, unspecified: Secondary | ICD-10-CM | POA: Insufficient documentation

## 2015-08-07 DIAGNOSIS — F17210 Nicotine dependence, cigarettes, uncomplicated: Secondary | ICD-10-CM | POA: Insufficient documentation

## 2015-08-07 DIAGNOSIS — R143 Flatulence: Secondary | ICD-10-CM | POA: Insufficient documentation

## 2015-08-07 DIAGNOSIS — Z79899 Other long term (current) drug therapy: Secondary | ICD-10-CM | POA: Insufficient documentation

## 2015-08-07 DIAGNOSIS — R1013 Epigastric pain: Secondary | ICD-10-CM | POA: Insufficient documentation

## 2015-08-07 DIAGNOSIS — Z8709 Personal history of other diseases of the respiratory system: Secondary | ICD-10-CM | POA: Insufficient documentation

## 2015-08-07 LAB — COMPREHENSIVE METABOLIC PANEL
ALBUMIN: 3.2 g/dL — AB (ref 3.5–5.0)
ALT: 15 U/L — AB (ref 17–63)
AST: 19 U/L (ref 15–41)
Alkaline Phosphatase: 74 U/L (ref 38–126)
Anion gap: 10 (ref 5–15)
BUN: 12 mg/dL (ref 6–20)
CHLORIDE: 104 mmol/L (ref 101–111)
CO2: 25 mmol/L (ref 22–32)
CREATININE: 0.94 mg/dL (ref 0.61–1.24)
Calcium: 9 mg/dL (ref 8.9–10.3)
GFR calc Af Amer: >60 mL/min (ref >60)
GLUCOSE: 86 mg/dL (ref 65–99)
POTASSIUM: 3.8 mmol/L (ref 3.5–5.1)
SODIUM: 139 mmol/L (ref 135–145)
Total Bilirubin: 0.6 mg/dL (ref 0.3–1.2)
Total Protein: 6.1 g/dL — ABNORMAL LOW (ref 6.5–8.1)

## 2015-08-07 LAB — CBC WITH DIFFERENTIAL/PLATELET
BASOS ABS: 0 10*3/uL (ref 0.0–0.1)
BASOS PCT: 0 %
EOS PCT: 2 %
Eosinophils Absolute: 0.1 10*3/uL (ref 0.0–0.7)
HCT: 34.8 % — ABNORMAL LOW (ref 39.0–52.0)
Hemoglobin: 11.1 g/dL — ABNORMAL LOW (ref 13.0–17.0)
LYMPHS PCT: 26 %
Lymphs Abs: 1.1 10*3/uL (ref 0.7–4.0)
MCH: 24.8 pg — ABNORMAL LOW (ref 26.0–34.0)
MCHC: 31.9 g/dL (ref 30.0–36.0)
MCV: 77.7 fL — AB (ref 78.0–100.0)
MONO ABS: 0.2 10*3/uL (ref 0.1–1.0)
Monocytes Relative: 6 %
Neutro Abs: 2.7 10*3/uL (ref 1.7–7.7)
Neutrophils Relative %: 66 %
PLATELETS: 213 10*3/uL (ref 150–400)
RBC: 4.48 MIL/uL (ref 4.22–5.81)
RDW: 13.6 % (ref 11.5–15.5)
WBC: 4 10*3/uL (ref 4.0–10.5)

## 2015-08-07 LAB — LIPASE, BLOOD: LIPASE: 122 U/L — AB (ref 11–51)

## 2015-08-07 MED ORDER — GI COCKTAIL ~~LOC~~
30.0000 mL | Freq: Once | ORAL | Status: AC
  Administered 2015-08-07: 30 mL via ORAL
  Filled 2015-08-07: qty 30

## 2015-08-07 NOTE — ED Notes (Signed)
Pt here with c/o epigastric pain that he says flared up when he gets stressed, hx of ulcers

## 2015-08-07 NOTE — Discharge Instructions (Signed)
Abdominal Pain, Adult Many things can cause abdominal pain. Usually, abdominal pain is not caused by a disease and will improve without treatment. It can often be observed and treated at home. Your health care provider will do a physical exam and possibly order blood tests and X-rays to help determine the seriousness of your pain. However, in many cases, more time must pass before a clear cause of the pain can be found. Before that point, your health care provider may not know if you need more testing or further treatment. HOME CARE INSTRUCTIONS Monitor your abdominal pain for any changes. The following actions may help to alleviate any discomfort you are experiencing:  Only take over-the-counter or prescription medicines as directed by your health care provider.  Do not take laxatives unless directed to do so by your health care provider.  Try a clear liquid diet (broth, tea, or water) as directed by your health care provider. Slowly move to a bland diet as tolerated. SEEK MEDICAL CARE IF:  You have unexplained abdominal pain.  You have abdominal pain associated with nausea or diarrhea.  You have pain when you urinate or have a bowel movement.  You experience abdominal pain that wakes you in the night.  You have abdominal pain that is worsened or improved by eating food.  You have abdominal pain that is worsened with eating fatty foods.  You have a fever. SEEK IMMEDIATE MEDICAL CARE IF:  Your pain does not go away within 2 hours.  You keep throwing up (vomiting).  Your pain is felt only in portions of the abdomen, such as the right side or the left lower portion of the abdomen.  You pass bloody or black tarry stools. MAKE SURE YOU:  Understand these instructions.  Will watch your condition.  Will get help right away if you are not doing well or get worse.   This information is not intended to replace advice given to you by your health care provider. Make sure you discuss  any questions you have with your health care provider.   Document Released: 12/18/2004 Document Revised: 11/29/2014 Document Reviewed: 11/17/2012 Elsevier Interactive Patient Education 2016 [employer].  Anemia, Nonspecific Anemia is a condition in which the concentration of red blood cells or hemoglobin in the blood is below normal. Hemoglobin is a substance in red blood cells that carries oxygen to the tissues of the body. Anemia results in not enough oxygen reaching these tissues.  CAUSES  Common causes of anemia include:   Excessive bleeding. Bleeding may be internal or external. This includes excessive bleeding from periods (in women) or from the intestine.   Poor nutrition.   Chronic kidney, thyroid, and liver disease.  Bone marrow disorders that decrease red blood cell production.  Cancer and treatments for cancer.  HIV, AIDS, and their treatments.  Spleen problems that increase red blood cell destruction.  Blood disorders.  Excess destruction of red blood cells due to infection, medicines, and autoimmune disorders. SIGNS AND SYMPTOMS   Minor weakness.   Dizziness.   Headache.  Palpitations.   Shortness of breath, especially with exercise.   Paleness.  Cold sensitivity.  Indigestion.  Nausea.  Difficulty sleeping.  Difficulty concentrating. Symptoms may occur suddenly or they may develop slowly.  DIAGNOSIS  Additional blood tests are often needed. These help your health care provider determine the best treatment. Your health care provider will check your stool for blood and look for other causes of blood loss.  TREATMENT  Treatment varies  depending on the cause of the anemia. Treatment can include:   Supplements of iron, vitamin 123456, or folic acid.   Hormone medicines.   A blood transfusion. This may be needed if blood loss is severe.   Hospitalization. This may be needed if there is significant continual blood loss.   Dietary  changes.  Spleen removal. HOME CARE INSTRUCTIONS Keep all follow-up appointments. It often takes many weeks to correct anemia, and having your health care provider check on your condition and your response to treatment is very important. SEEK IMMEDIATE MEDICAL CARE IF:   You develop extreme weakness, shortness of breath, or chest pain.   You become dizzy or have trouble concentrating.  You develop heavy vaginal bleeding.   You develop a rash.   You have bloody or black, tarry stools.   You faint.   You vomit up blood.   You vomit repeatedly.   You have abdominal pain.  You have a fever or persistent symptoms for more than 2-3 days.   You have a fever and your symptoms suddenly get worse.   You are dehydrated.  MAKE SURE YOU:  Understand these instructions.  Will watch your condition.  Will get help right away if you are not doing well or get worse.   This information is not intended to replace advice given to you by your health care provider. Make sure you discuss any questions you have with your health care provider.   Document Released: 04/17/2004 Document Revised: 11/10/2012 Document Reviewed: 09/03/2012 Elsevier Interactive Patient Education [employer].

## 2015-08-07 NOTE — ED Provider Notes (Signed)
CSN: [id number]     Arrival date & time 08/07/15  [id number] History   First MD Initiated Contact with Patient 08/07/15 0813     Chief Complaint  Patient presents with  . Abdominal Pain     (Consider location/radiation/quality/duration/timing/severity/associated sxs/prior Treatment) HPI Comments: Recurrent epigastric pain, "Feels like my ulcer flaring up."  Had been given PPI before but frequently does not take due to financial reasons.  Has been anemic. No dark stools.  No lightheadedness or syncope.  Denies CP, hemoptysis, SOB. Last visit lipase mildly elevated to 110.  Prior US wo gallstones.  Usually feels much better with GI cocktail.  Has not had an endoscopy.    Patient is a 50 y.o. male presenting with abdominal pain. The history is provided by the patient.  Abdominal Pain Pain location:  Epigastric Pain quality: aching, burning and gnawing   Pain radiates to:  Does not radiate Pain severity:  Mild Onset quality:  Gradual Duration:  1 day Timing:  Constant Progression:  Unchanged Chronicity:  Recurrent Context: eating   Context: not recent travel, not suspicious food intake and not trauma   Context comment:  "baby mama drama" stress always makes my abd hurt Relieved by: milk. Exacerbated by: meals, eating. Associated symptoms: flatus   Associated symptoms: no anorexia, no chest pain, no chills, no constipation, no cough, no diarrhea, no dysuria, no fatigue, no fever, no hematemesis, no hematochezia, no hematuria, no melena, no nausea, no shortness of breath and no vomiting   Risk factors: NSAID use (6 per day only uses when abd hurts)   Risk factors: no alcohol abuse (well over 20 years), no aspirin use and has not had multiple surgeries     Past Medical History  Diagnosis Date  . Ulcer   . Gallstones   . Hemothorax   . Anemia Dx 2014   History reviewed. No pertinent past surgical history. Family History  Problem Relation Age of Onset  . Diabetes Mother   . Hypertension  Mother   . Heart disease Mother     x 3   . Diabetes Other   . Heart disease Brother   . Hypertension Brother   . Cancer Neg Hx   . Hypertension Brother   . Hypertension Brother    Social History  Substance Use Topics  . Smoking status: Current Every Day Smoker -- 1.50 packs/day for 31 years    Types: Cigarettes  . Smokeless tobacco: Never Used  . Alcohol Use: No     Comment: quit 1980's     Review of Systems  Constitutional: Negative for fever, chills and fatigue.  Respiratory: Negative for cough, shortness of breath and wheezing.   Cardiovascular: Negative for chest pain.  Gastrointestinal: Positive for abdominal pain and flatus. Negative for nausea, vomiting, diarrhea, constipation, blood in stool, melena, hematochezia, abdominal distention, anorexia and hematemesis.  Genitourinary: Negative for dysuria, hematuria and decreased urine volume.  Musculoskeletal: Negative for back pain.  Skin: Negative for rash.  Neurological: Negative for syncope.  All other systems reviewed and are negative.   Allergies  Aspirin  Home Medications   Prior to Admission medications   Medication Sig Start Date End Date Taking? Authorizing Provider  acetaminophen (TYLENOL) 500 MG tablet Take 1,000 mg by mouth every 6 (six) hours as needed for mild pain.   Yes Historical Provider, MD  FLUoxetine (PROZAC) 20 MG capsule Take 20 mg by mouth daily.   Yes Historical Provider, MD  omeprazole (PRILOSEC) 20 MG capsule  Take 1 capsule (20 mg total) by mouth daily. 07/23/15  Yes Heather Laisure, PA-C  Pseudoephedrine-Ibuprofen (ADVIL COLD & SINUS LIQUI-GELS) 30-200 MG CAPS Take 1 tablet by mouth every 6 (six) hours as needed (pain).   Yes Historical Provider, MD  doxycycline (VIBRAMYCIN) 100 MG capsule Take 1 capsule (100 mg total) by mouth 2 (two) times daily. 06/09/15   Hanna Patel-Mills, PA-C   BP 124/82 mmHg  Pulse 76  Temp(Src) 97.8 F (36.6 C) (Oral)  Resp 23  SpO2 99% Physical Exam   Constitutional: He is oriented to person, place, and time. He appears well-developed and well-nourished. No distress.  comfortable  HENT:  Head: Normocephalic and atraumatic.  Nose: Nose normal.  Eyes: Conjunctivae are normal.  No conj pallor  Neck: Normal range of motion. Neck supple. No tracheal deviation present.  Cardiovascular: Normal rate, regular rhythm and normal heart sounds.   No murmur heard. Pulmonary/Chest: Effort normal and breath sounds normal. No respiratory distress. He has no rales.  Abdominal: Soft. Bowel sounds are normal. He exhibits no distension and no mass. There is tenderness (epigastric).  Neg cvat, neg heel tap. Neg Murphys  Musculoskeletal: Normal range of motion. He exhibits no edema.  Neurological: He is alert and oriented to person, place, and time.  Skin: Skin is warm and dry. No rash noted.  Psychiatric: He has a normal mood and affect.  Nursing note and vitals reviewed.   ED Course  Procedures (including critical care time) Labs Review Labs Reviewed  CBC WITH DIFFERENTIAL/PLATELET - Abnormal; Notable for the following:    Hemoglobin 11.1 (*)    HCT 34.8 (*)    MCV 77.7 (*)    MCH 24.8 (*)    All other components within normal limits  COMPREHENSIVE METABOLIC PANEL - Abnormal; Notable for the following:    Total Protein 6.1 (*)    Albumin 3.2 (*)    ALT 15 (*)    All other components within normal limits  LIPASE, BLOOD - Abnormal; Notable for the following:    Lipase 122 (*)    All other components within normal limits    Imaging Review No results found. I have personally reviewed and evaluated these images and lab results as part of my medical decision-making.   EKG Interpretation   Date/Time:  Tuesday Aug 07 2015 08:45:45 EDT Ventricular Rate:  77 PR Interval:  144 QRS Duration: 86 QT Interval:  375 QTC Calculation: 424 R Axis:   93 Text Interpretation:  Normal sinus rhythm Baseline wander Poor data  quality Serial comparison  not performed, all previous tracings are of poor  data quality Confirmed by RAY MD, Andee Poles [phone]) on 08/07/2015 8:49:31 AM  Also confirmed by RAY MD, Andee Poles [phone]), editor [place] CT, Leda Gauze  [phone])  on 08/07/2015 9:23:15 AM      MDM   Final diagnoses:  Epigastric abdominal pain  Microcytic anemia    No signs of peritonitis or systemic toxicity to suggest surgical emergency.  He has had multiple visits for similar symptomology without significant disease found.  Reports ulcer pain, never has this been documented via endoscopy.  Labs without evidence of biliary obstruction, LFTs wnl. Lipase mildly elevated, not 3x normal. Screening EKG wo changes compared to prior, no CP/SOB, doubt ACS.  Lungs CTAB.  Abd pain exactly reproducible, making ACS less likely.  Hgb down 1 g from prior, but near chronic baseline. Mild microcytic. Has been borderline anemic before. No indication for emergent endoscopy at this time, as  vitals are stable, no melena and he is not symptomatic from anemia, not on ACs.  Will need very close PCP f/u to trend Hgb this week. Likely needs EGD/colonoscopy.  Counseled on NSAID use and strict return precautions for bloody stools, fatigue, syncope, worsening abd pain, dizzy.   9:54 AM pain resolved. No abd pain. abd not tender.      Tammy Sours, MD 08/07/15 1003  Pattricia Boss, MD 08/07/15 [phone]

## 2016-03-28 ENCOUNTER — Other Ambulatory Visit: Payer: Self-pay | Admitting: Family Medicine

## 2017-11-25 ENCOUNTER — Encounter (HOSPITAL_COMMUNITY): Payer: Self-pay | Admitting: Emergency Medicine

## 2017-11-25 ENCOUNTER — Emergency Department (HOSPITAL_COMMUNITY)
Admission: EM | Admit: 2017-11-25 | Discharge: 2017-11-25 | Disposition: A | Discharge status: Home / Self Care | Payer: Self-pay | Attending: Emergency Medicine | Admitting: Emergency Medicine

## 2017-11-25 ENCOUNTER — Emergency Department (HOSPITAL_COMMUNITY): Payer: Self-pay

## 2017-11-25 ENCOUNTER — Other Ambulatory Visit: Payer: Self-pay

## 2017-11-25 DIAGNOSIS — Z79899 Other long term (current) drug therapy: Secondary | ICD-10-CM | POA: Insufficient documentation

## 2017-11-25 DIAGNOSIS — F17210 Nicotine dependence, cigarettes, uncomplicated: Secondary | ICD-10-CM | POA: Insufficient documentation

## 2017-11-25 DIAGNOSIS — R1010 Upper abdominal pain, unspecified: Secondary | ICD-10-CM | POA: Insufficient documentation

## 2017-11-25 LAB — URINALYSIS, ROUTINE W REFLEX MICROSCOPIC
Bilirubin Urine: NEGATIVE
Glucose, UA: NEGATIVE mg/dL
KETONES UR: 5 mg/dL — AB
Nitrite: NEGATIVE
PROTEIN: NEGATIVE mg/dL
Specific Gravity, Urine: 1.029 (ref 1.005–1.030)
pH: 5 (ref 5.0–8.0)

## 2017-11-25 LAB — COMPREHENSIVE METABOLIC PANEL
ALBUMIN: 4 g/dL (ref 3.5–5.0)
ALT: 14 U/L (ref 0–44)
ANION GAP: 9 (ref 5–15)
AST: 23 U/L (ref 15–41)
Alkaline Phosphatase: 84 U/L (ref 38–126)
BUN: 13 mg/dL (ref 6–20)
CO2: 25 mmol/L (ref 22–32)
Calcium: 9.2 mg/dL (ref 8.9–10.3)
Chloride: 105 mmol/L (ref 98–111)
Creatinine, Ser: 1.06 mg/dL (ref 0.61–1.24)
GFR calc Af Amer: >60 mL/min (ref >60)
GFR calc non Af Amer: >60 mL/min (ref >60)
GLUCOSE: 91 mg/dL (ref 70–99)
POTASSIUM: 3.4 mmol/L — AB (ref 3.5–5.1)
SODIUM: 139 mmol/L (ref 135–145)
Total Bilirubin: 1 mg/dL (ref 0.3–1.2)
Total Protein: 7.2 g/dL (ref 6.5–8.1)

## 2017-11-25 LAB — CBC
HEMATOCRIT: 38.7 % — AB (ref 39.0–52.0)
HEMOGLOBIN: 12 g/dL — AB (ref 13.0–17.0)
MCH: 25.3 pg — AB (ref 26.0–34.0)
MCHC: 31 g/dL (ref 30.0–36.0)
MCV: 81.5 fL (ref 78.0–100.0)
Platelets: 257 10*3/uL (ref 150–400)
RBC: 4.75 MIL/uL (ref 4.22–5.81)
RDW: 13 % (ref 11.5–15.5)
WBC: 7.4 10*3/uL (ref 4.0–10.5)

## 2017-11-25 LAB — LIPASE, BLOOD: Lipase: 48 U/L (ref 11–51)

## 2017-11-25 MED ORDER — SUCRALFATE 1 G PO TABS: 1.0000 g | ORAL_TABLET | Freq: Three times a day (TID) | ORAL | 0 refills | Status: DC

## 2017-11-25 MED ORDER — PANTOPRAZOLE SODIUM 40 MG PO TBEC
40.0000 mg | DELAYED_RELEASE_TABLET | Freq: Once | ORAL | Status: AC
  Administered 2017-11-25: 40 mg via ORAL
  Filled 2017-11-25: qty 1

## 2017-11-25 MED ORDER — SUCRALFATE 1 G PO TABS
1.0000 g | ORAL_TABLET | Freq: Once | ORAL | Status: AC
  Administered 2017-11-25: 1 g via ORAL
  Filled 2017-11-25: qty 1

## 2017-11-25 MED ORDER — PANTOPRAZOLE SODIUM 20 MG PO TBEC: 20.0000 mg | DELAYED_RELEASE_TABLET | Freq: Every day | ORAL | 0 refills | Status: DC

## 2017-11-25 MED ORDER — GI COCKTAIL ~~LOC~~
30.0000 mL | Freq: Once | ORAL | Status: AC
  Administered 2017-11-25: 30 mL via ORAL
  Filled 2017-11-25: qty 30

## 2017-11-25 NOTE — ED Provider Notes (Signed)
[facility] EMERGENCY DEPARTMENT Provider Note   CSN: [id number] Arrival date & time: 11/25/17  0126     History   Chief Complaint Chief Complaint  Patient presents with  . Abdominal Pain    HPI Marvin Thomas is a 52 y.o. male.  HPI  This is a 52 year old male with a history of gallstones and peptic ulcers who presents with abdominal pain.  Patient reports mid abdominal pain over the last 2 to 3 days.  Worsened after eating.  It is epigastric and nonradiating.  He states "it feels like somebody is squeezing my stomach."  He reports nausea without vomiting.  Normal bowel movements.  He has not taken anything for his symptoms.  He has a history of ulcers but does not take any medications.  Denies chest pain, shortness of breath, fevers.  Denies alcohol or drug use.  Past Medical History:  Diagnosis Date  . Anemia Dx 2014  . Gallstones   . Hemothorax   . Ulcer     Patient Active Problem List   Diagnosis Date Noted  . Hypokalemia 02/01/2014  . Pain, dental 01/30/2014  . Family history of early CAD 01/30/2014  . Smoker 01/30/2014  . Sebaceous cyst 01/30/2014  . Smooth enlarged prostate 01/30/2014  . Anemia     History reviewed. No pertinent surgical history.      Home Medications    Prior to Admission medications   Medication Sig Start Date End Date Taking? Authorizing Provider  acetaminophen (TYLENOL) 500 MG tablet Take 1,000 mg by mouth every 6 (six) hours as needed for mild pain.    [provider]  doxycycline (VIBRAMYCIN) 100 MG capsule Take 1 capsule (100 mg total) by mouth 2 (two) times daily. 06/09/15   Patel-Mills, Orvil Feil, PA-C  FLUoxetine (PROZAC) 20 MG capsule Take 20 mg by mouth daily.    [provider]  omeprazole (PRILOSEC) 20 MG capsule Take 1 capsule (20 mg total) by mouth daily. 07/23/15   Hyman Bible, PA-C  pantoprazole (PROTONIX) 20 MG tablet Take 1 tablet (20 mg total) by mouth daily. 11/25/17   Broady Lafoy,  Barbette Hair, MD  Pseudoephedrine-Ibuprofen (ADVIL COLD & SINUS LIQUI-GELS) 30-200 MG CAPS Take 1 tablet by mouth every 6 (six) hours as needed (pain).    [provider]  sucralfate (CARAFATE) 1 g tablet Take 1 tablet (1 g total) by mouth 4 (four) times daily -  with meals and at bedtime. 11/25/17   Teren Franckowiak, Barbette Hair, MD    Family History Family History  Problem Relation Age of Onset  . Diabetes Mother   . Hypertension Mother   . Heart disease Mother        x 3   . Heart disease Brother   . Hypertension Brother   . Diabetes Other   . Hypertension Brother   . Hypertension Brother   . Cancer Neg Hx     Social History Social History   Tobacco Use  . Smoking status: Current Every Day Smoker    Packs/day: 1.50    Years: 31.00    Pack years: 46.50    Types: Cigarettes  . Smokeless tobacco: Never Used  Substance Use Topics  . Alcohol use: No    Comment: quit 1980's   . Drug use: Yes    Types: Marijuana    Comment: Patient reports using last night     Allergies   Aspirin   Review of Systems Review of Systems  Constitutional: Negative for  fever.  Respiratory: Negative for shortness of breath.   Cardiovascular: Negative for chest pain.  Gastrointestinal: Positive for abdominal pain and nausea. Negative for constipation, diarrhea and vomiting.  Genitourinary: Negative for dysuria.  All other systems reviewed and are negative.    Physical Exam Updated Vital Signs BP 122/67   Pulse (!) 57   Temp 98.7 F (37.1 C) (Oral)   Resp 14   Ht 1.765 m (5' 9.5")   Wt 72.6 kg   SpO2 100%   BMI 23.29 kg/m   Physical Exam  Constitutional: He is oriented to person, place, and time. He appears well-developed and well-nourished.  HENT:  Head: Normocephalic and atraumatic.  Neck: Neck supple.  Cardiovascular: Normal rate, regular rhythm and normal heart sounds.  No murmur heard. Pulmonary/Chest: Effort normal and breath sounds normal. No respiratory distress. He has  no wheezes.  Abdominal: Soft. Bowel sounds are normal. There is tenderness in the epigastric area. There is no rebound and no guarding.  Musculoskeletal: He exhibits no edema.  Lymphadenopathy:    He has no cervical adenopathy.  Neurological: He is alert and oriented to person, place, and time.  Skin: Skin is warm and dry.  Psychiatric: He has a normal mood and affect.  Nursing note and vitals reviewed.    ED Treatments / Results  Labs (all labs ordered are listed, but only abnormal results are displayed) Labs Reviewed  COMPREHENSIVE METABOLIC PANEL - Abnormal; Notable for the following components:      Result Value   Potassium 3.4 (*)    All other components within normal limits  CBC - Abnormal; Notable for the following components:   Hemoglobin 12.0 (*)    HCT 38.7 (*)    MCH 25.3 (*)    All other components within normal limits  URINALYSIS, ROUTINE W REFLEX MICROSCOPIC - Abnormal; Notable for the following components:   APPearance HAZY (*)    Hgb urine dipstick SMALL (*)    Ketones, ur 5 (*)    Leukocytes, UA SMALL (*)    Bacteria, UA RARE (*)    All other components within normal limits  LIPASE, BLOOD    EKG None  Radiology Dg Abdomen 1 View  Result Date: 11/25/2017 CLINICAL DATA:  Central abdominal pain for 2 days. History of ulcers. EXAM: ABDOMEN - 1 VIEW COMPARISON:  CT abdomen and pelvis 11/08/2012 FINDINGS: Scattered gas and stool throughout the colon with some gas-filled small bowel. A right abdominal small bowel loop demonstrates suggestion of fold thickening. Changes may indicate enteritis. No abnormal small or large bowel distention. No radiopaque stones. Visualized bones and soft tissue contours appear intact. IMPRESSION: Nonobstructive bowel gas pattern. Suggestion of fold thickening in the right abdominal small bowel loop suggesting enteritis. Electronically Signed   By: Lucienne Capers M.D.   On: 11/25/2017 04:50    Procedures Procedures (including  critical care time)  Medications Ordered in ED Medications  gi cocktail (Maalox,Lidocaine,Donnatal) (30 mLs Oral Given 11/25/17 0431)  pantoprazole (PROTONIX) EC tablet 40 mg (40 mg Oral Given 11/25/17 0431)  sucralfate (CARAFATE) tablet 1 g (1 g Oral Given 11/25/17 0431)     Initial Impression / Assessment and Plan / ED Course  I have reviewed the triage vital signs and the nursing notes.  Pertinent labs & imaging results that were available during my care of the patient were reviewed by me and considered in my medical decision making (see chart for details).     Patient with upper abdominal pain.  Worse with eating.  He is nontoxic-appearing vital signs are reassuring.  He is tender on exam without signs of peritonitis.  Lab work shows no signs of pancreatitis.  Abnormal LFTs.  Acute abdominal series does not show any free air or perforation.  Suspect gastritis versus peptic ulcer.  Patient improved with GI cocktail and Carafate.  He was able to tolerate food and fluids; however, he does report some increasing of pain with p.o. challenge.  Recommend initiating omeprazole and Carafate at home.  GI follow-up.  Patient states understanding.  After history, exam, and medical workup I feel the patient has been appropriately medically screened and is safe for discharge home. Pertinent diagnoses were discussed with the patient. Patient was given return precautions.   Final Clinical Impressions(s) / ED Diagnoses   Final diagnoses:  Pain of upper abdomen    ED Discharge Orders         Ordered    sucralfate (CARAFATE) 1 g tablet  3 times daily with meals & bedtime     11/25/17 0537    pantoprazole (PROTONIX) 20 MG tablet  Daily     11/25/17 0537           Merryl Hacker, MD 11/25/17 [phone]

## 2017-11-25 NOTE — ED Triage Notes (Signed)
Pt reports midabdominal pain, denies nausea,vomiting, or diarrhea. States that he thinks its his ulcers and reports noncompliance with medications to treat his ulcers.

## 2017-11-25 NOTE — Discharge Instructions (Addendum)
You were seen today for upper abdominal pain.  This may be related to peptic ulcers.  Follow a clear bland diet.  Take medications as prescribed.  Follow-up with gastroenterology.  If you have acute worsening of pain or any new or worsening symptoms you should be reevaluated.

## 2017-11-25 NOTE — ED Notes (Addendum)
Patient given crackers and PO fluids. States "it's making my stomach cramp".

## 2017-12-08 ENCOUNTER — Encounter (HOSPITAL_COMMUNITY): Payer: Self-pay | Admitting: Emergency Medicine

## 2017-12-08 ENCOUNTER — Inpatient Hospital Stay (HOSPITAL_COMMUNITY)
Admission: EM | Admit: 2017-12-08 | Discharge: 2017-12-13 | DRG: 330 | Disposition: A | Discharge status: Home / Self Care | Payer: Self-pay | Attending: General Surgery | Admitting: General Surgery

## 2017-12-08 ENCOUNTER — Encounter (HOSPITAL_COMMUNITY): Payer: Self-pay | Admitting: *Deleted

## 2017-12-08 ENCOUNTER — Other Ambulatory Visit: Payer: Self-pay

## 2017-12-08 ENCOUNTER — Emergency Department (HOSPITAL_COMMUNITY)
Admission: EM | Admit: 2017-12-08 | Discharge: 2017-12-08 | Discharge status: Against Medical Advice | Payer: Self-pay | Attending: Emergency Medicine | Admitting: Emergency Medicine

## 2017-12-08 DIAGNOSIS — K256 Chronic or unspecified gastric ulcer with both hemorrhage and perforation: Principal | ICD-10-CM | POA: Diagnosis present

## 2017-12-08 DIAGNOSIS — Z9889 Other specified postprocedural states: Secondary | ICD-10-CM

## 2017-12-08 DIAGNOSIS — M549 Dorsalgia, unspecified: Secondary | ICD-10-CM | POA: Diagnosis present

## 2017-12-08 DIAGNOSIS — R109 Unspecified abdominal pain: Secondary | ICD-10-CM | POA: Insufficient documentation

## 2017-12-08 DIAGNOSIS — R198 Other specified symptoms and signs involving the digestive system and abdomen: Secondary | ICD-10-CM

## 2017-12-08 DIAGNOSIS — Z5321 Procedure and treatment not carried out due to patient leaving prior to being seen by health care provider: Secondary | ICD-10-CM | POA: Insufficient documentation

## 2017-12-08 DIAGNOSIS — R001 Bradycardia, unspecified: Secondary | ICD-10-CM | POA: Diagnosis present

## 2017-12-08 DIAGNOSIS — M25512 Pain in left shoulder: Secondary | ICD-10-CM | POA: Diagnosis present

## 2017-12-08 DIAGNOSIS — F17210 Nicotine dependence, cigarettes, uncomplicated: Secondary | ICD-10-CM | POA: Diagnosis present

## 2017-12-08 DIAGNOSIS — J439 Emphysema, unspecified: Secondary | ICD-10-CM | POA: Diagnosis present

## 2017-12-08 DIAGNOSIS — Z79899 Other long term (current) drug therapy: Secondary | ICD-10-CM

## 2017-12-08 DIAGNOSIS — Z833 Family history of diabetes mellitus: Secondary | ICD-10-CM

## 2017-12-08 DIAGNOSIS — Z9119 Patient's noncompliance with other medical treatment and regimen: Secondary | ICD-10-CM

## 2017-12-08 DIAGNOSIS — K275 Chronic or unspecified peptic ulcer, site unspecified, with perforation: Secondary | ICD-10-CM

## 2017-12-08 DIAGNOSIS — I959 Hypotension, unspecified: Secondary | ICD-10-CM | POA: Diagnosis present

## 2017-12-08 DIAGNOSIS — Z8249 Family history of ischemic heart disease and other diseases of the circulatory system: Secondary | ICD-10-CM

## 2017-12-08 DIAGNOSIS — Z886 Allergy status to analgesic agent status: Secondary | ICD-10-CM

## 2017-12-08 DIAGNOSIS — N179 Acute kidney failure, unspecified: Secondary | ICD-10-CM | POA: Diagnosis present

## 2017-12-08 LAB — CBC
HCT: 42.3 % (ref 39.0–52.0)
HEMOGLOBIN: 13.1 g/dL (ref 13.0–17.0)
MCH: 25.1 pg — AB (ref 26.0–34.0)
MCHC: 31 g/dL (ref 30.0–36.0)
MCV: 81.2 fL (ref 78.0–100.0)
Platelets: 278 10*3/uL (ref 150–400)
RBC: 5.21 MIL/uL (ref 4.22–5.81)
RDW: 12.8 % (ref 11.5–15.5)
WBC: 7.4 10*3/uL (ref 4.0–10.5)

## 2017-12-08 LAB — COMPREHENSIVE METABOLIC PANEL
ALBUMIN: 3.6 g/dL (ref 3.5–5.0)
ALK PHOS: 72 U/L (ref 38–126)
ALT: 10 U/L (ref 0–44)
ANION GAP: 8 (ref 5–15)
AST: 19 U/L (ref 15–41)
BUN: 28 mg/dL — ABNORMAL HIGH (ref 6–20)
CALCIUM: 9 mg/dL (ref 8.9–10.3)
CO2: 24 mmol/L (ref 22–32)
Chloride: 103 mmol/L (ref 98–111)
Creatinine, Ser: 1.26 mg/dL — ABNORMAL HIGH (ref 0.61–1.24)
GFR calc Af Amer: >60 mL/min (ref >60)
GFR calc non Af Amer: >60 mL/min (ref >60)
GLUCOSE: 101 mg/dL — AB (ref 70–99)
Potassium: 4.5 mmol/L (ref 3.5–5.1)
SODIUM: 135 mmol/L (ref 135–145)
Total Bilirubin: 0.7 mg/dL (ref 0.3–1.2)
Total Protein: 6.6 g/dL (ref 6.5–8.1)

## 2017-12-08 LAB — LIPASE, BLOOD: Lipase: 27 U/L (ref 11–51)

## 2017-12-08 MED ORDER — SODIUM CHLORIDE 0.9 % IV BOLUS
1000.0000 mL | Freq: Once | INTRAVENOUS | Status: AC
  Administered 2017-12-09: 1000 mL via INTRAVENOUS

## 2017-12-08 MED ORDER — FENTANYL CITRATE (PF) 100 MCG/2ML IJ SOLN
50.0000 ug | Freq: Once | INTRAMUSCULAR | Status: AC
  Administered 2017-12-09: 50 ug via INTRAVENOUS
  Filled 2017-12-08: qty 2

## 2017-12-08 MED ORDER — ONDANSETRON HCL 4 MG/2ML IJ SOLN
4.0000 mg | Freq: Once | INTRAMUSCULAR | Status: AC
  Administered 2017-12-09: 4 mg via INTRAVENOUS
  Filled 2017-12-08: qty 2

## 2017-12-08 NOTE — ED Notes (Signed)
Pt up to desk in lobby asking about wait time, notified pt of time that he has been here and asked him to give a urine sample. Pt states "I do not need to pee right now. Right now I need pain meds and a room." Pt aware that we do not have any rooms open at the time.

## 2017-12-08 NOTE — ED Notes (Signed)
Labs drawn and resulted previously at [facility] ED around 12 30 today, will await provider assessment prior to initiation of further lab draw.

## 2017-12-08 NOTE — ED Triage Notes (Signed)
Pt arrives via EMS from home with c/o abdominal pain. Per their report. Hx of ulcers. Initial palpated pressure was 78. Just PTA vitals 100/80, hr 45-60, 100% RA, CBG 92

## 2017-12-08 NOTE — ED Notes (Signed)
Pt c/o abdominal pain that goes through his chest and shoulders. Reports he went to [facility] but did not want to wait any more. He had a stool since going home and it had blood in it. Last BM prior to today was a week ago. He denies straining today. Hx of stomach ulcers, took extra strength tylenol for pain. Says he "was prescribed something but never went to get it" for his stomach ulcers.

## 2017-12-08 NOTE — ED Notes (Signed)
Patient yelling he cant't breathe and stated he was holding his breath due to the pain; patient's oxygen is stating at 100% on room air.

## 2017-12-08 NOTE — ED Provider Notes (Signed)
[facility] DEPT Provider Note   CSN: [id number] Arrival date & time: 12/08/17  2326     History   Chief Complaint Chief Complaint  Patient presents with  . Abdominal Pain    HPI Marvin Thomas is a 52 y.o. male.  HPI   Marvin Thomas is a 52 y.o. male, with a history of gallstones and anemia, presenting to the ED with upper abdominal worsening over the last four days. Pain is stabbing, now 10/10, came on while resting, radiating toward the left shoulder, around to the flanks, and into the pelvis. Has not had this pain before, but states, "I think it might be my ulcer, but it's worse than any pain I've ever felt with that."  Accompanied by shortness of breath, but states he does not know if this is due to pain or another reason. He has been diagnosed with suspected PUD, but has not followed up with GI and has not had an EGD performed.  Denies regular NSAID or alcohol use.  Last food intake was 3 to 4 days ago. Dark red blood with BM today.   Denies fever, vomiting, urinary symptoms, or any other complaints.   Past Medical History:  Diagnosis Date  . Anemia Dx 2014  . Gallstones   . Hemothorax   . Ulcer     Patient Active Problem List   Diagnosis Date Noted  . Hypokalemia 02/01/2014  . Pain, dental 01/30/2014  . Family history of early CAD 01/30/2014  . Smoker 01/30/2014  . Sebaceous cyst 01/30/2014  . Smooth enlarged prostate 01/30/2014  . Anemia     History reviewed. No pertinent surgical history.      Home Medications    Prior to Admission medications   Medication Sig Start Date End Date Taking? Authorizing Provider  acetaminophen (TYLENOL) 500 MG tablet Take 1,000 mg by mouth every 6 (six) hours as needed for mild pain.   Yes [provider]  pantoprazole (PROTONIX) 20 MG tablet Take 1 tablet (20 mg total) by mouth daily. 11/25/17   Horton, Barbette Hair, MD  sucralfate (CARAFATE) 1 g tablet Take 1 tablet (1 g total)  by mouth 4 (four) times daily -  with meals and at bedtime. 11/25/17   Horton, Barbette Hair, MD    Family History Family History  Problem Relation Age of Onset  . Diabetes Mother   . Hypertension Mother   . Heart disease Mother        x 3   . Heart disease Brother   . Hypertension Brother   . Diabetes Other   . Hypertension Brother   . Hypertension Brother   . Cancer Neg Hx     Social History Social History   Tobacco Use  . Smoking status: Current Every Day Smoker    Packs/day: 1.50    Years: 31.00    Pack years: 46.50    Types: Cigarettes  . Smokeless tobacco: Never Used  Substance Use Topics  . Alcohol use: Yes    Comment: quit 1980's   . Drug use: Yes    Types: Marijuana    Comment: Patient reports using last night     Allergies   Aspirin   Review of Systems Review of Systems  Constitutional: Negative for chills, diaphoresis and fever.  Respiratory: Negative for shortness of breath.   Gastrointestinal: Positive for abdominal pain, blood in stool, diarrhea and nausea. Negative for abdominal distention and vomiting.  Musculoskeletal: Negative for back pain.  Neurological: Negative for dizziness and light-headedness.  All other systems reviewed and are negative.    Physical Exam Updated Vital Signs BP (!) 103/55 (BP Location: Right Arm)   Pulse (!) 44   Resp 10   Ht 5\' 9"  (1.753 m)   Wt 68 kg   SpO2 100%   BMI 22.15 kg/m   Physical Exam  Constitutional: He appears well-developed and well-nourished. He appears distressed (pain).  HENT:  Head: Normocephalic and atraumatic.  Eyes: Conjunctivae are normal.  Neck: Neck supple.  Cardiovascular: Regular rhythm, normal heart sounds and intact distal pulses. Bradycardia present.  Pulmonary/Chest: Effort normal and breath sounds normal. No respiratory distress. He exhibits no tenderness.  Abdominal: Soft. There is generalized tenderness. There is guarding.  Tenderness is generalized, but worse in the  epigastric region.   Genitourinary: Rectal exam shows guaiac positive stool.  Genitourinary Comments: No external hemorrhoids, fissures, or lesions noted. No gross blood, melena, or stool burden. No rectal tenderness. No foreign bodies noted. RN, Claiborne Billings, served as chaperone during the rectal exam.  Musculoskeletal: He exhibits no edema.  Lymphadenopathy:    He has no cervical adenopathy.  Neurological: He is alert.  Skin: Skin is warm and dry. He is not diaphoretic.  Psychiatric: He is agitated.  Nursing note and vitals reviewed.    ED Treatments / Results  Labs (all labs ordered are listed, but only abnormal results are displayed) Labs Reviewed  COMPREHENSIVE METABOLIC PANEL - Abnormal; Notable for the following components:      Result Value   BUN 34 (*)    Creatinine, Ser 1.26 (*)    Calcium 8.8 (*)    Total Protein 6.4 (*)    All other components within normal limits  CBC WITH DIFFERENTIAL/PLATELET - Abnormal; Notable for the following components:   Hemoglobin 12.8 (*)    MCV 77.9 (*)    MCH 25.5 (*)    All other components within normal limits  POC OCCULT BLOOD, ED - Abnormal; Notable for the following components:   Fecal Occult Bld POSITIVE (*)    All other components within normal limits  LIPASE, BLOOD  I-STAT TROPONIN, ED    EKG EKG Interpretation  Date/Time:  Tuesday December 08 2017 23:32:34 EDT Ventricular Rate:  43 PR Interval:    QRS Duration: 111 QT Interval:  507 QTC Calculation: 429 R Axis:   92 Text Interpretation:  Sinus bradycardia Borderline right axis deviation Abnrm T, consider ischemia, anterolateral lds ST elev, probable normal early repol pattern No significant change since last tracing Confirmed by Isla Pence [phone]) on 12/08/2017 11:40:11 PM   Radiology Ct Angio Chest/abd/pel For Dissection W And/or Wo Contrast  Result Date: 12/09/2017 CLINICAL DATA:  Nausea and vomiting. Patient believes he as peptic ulcer disease. Chest, abdominal  and back pain. EXAM: CT ANGIOGRAPHY CHEST, ABDOMEN AND PELVIS TECHNIQUE: Multidetector CT imaging through the chest, abdomen and pelvis was performed using the standard protocol during bolus administration of intravenous contrast. Multiplanar reconstructed images and MIPs were obtained and reviewed to evaluate the vascular anatomy. CONTRAST:  184mL ISOVUE-370 IOPAMIDOL (ISOVUE-370) INJECTION 76% COMPARISON:  CT chest 11/16/2008 and CT abdomen 11/08/2012 FINDINGS: CTA CHEST FINDINGS Cardiovascular: Heart size is normal. No pericardial effusion, aortic aneurysm or dissection. No pulmonary embolus. Great vessels are conventional and branch pattern without stenosis or dissection. Mediastinum/Nodes: No enlarged mediastinal, hilar, or axillary lymph nodes. Thyroid gland, trachea, and esophagus demonstrate no significant findings. Lungs/Pleura: Paraseptal and bullous emphysematous disease of the lungs, upper lobe  predominant and right greater than left. Large right upper lobe bulla is identified measuring 10.7 x 7.7 x 12.7 cm. No pulmonary consolidation or dominant mass. No pneumothorax or effusion. Musculoskeletal: Degenerative changes are noted along the thoracic spine without aggressive osseous lesions. Review of the MIP images confirms the above findings. CTA ABDOMEN AND PELVIS FINDINGS VASCULAR Aorta: Nonaneurysmal.  No dissection. Celiac: Patent without significant stenosis, aneurysm or dissection. SMA: Patent Renals: Patent bilateral renal arteries without significant stenosis, aneurysm or evidence of fibromuscular dysplasia. IMA: Patent Inflow: Patent without evidence of aneurysm, dissection, vasculitis or significant stenosis. Veins: No obvious venous abnormality within the limitations of this arterial phase study. Review of the MIP images confirms the above findings. NON-VASCULAR Hepatobiliary: Physiologic distention of the gallbladder without stones. No space-occupying mass of the liver. Pancreas: The pancreatic  duct is visualized but is normal caliber. No mass or definite inflammation though assessment for inflammation is limited due to lack of intra-abdominal fat. Spleen: No splenomegaly. Adrenals/Urinary Tract: Adrenal mass. Cortical symmetric enhancement of both kidneys without nephrolithiasis, renal mass nor obstructive uropathy. The urinary bladder is physiologically distended. Stomach/Bowel: Free intraperitoneal air is noted within the upper abdomen with speckled air like lucencies overlying the stomach and extending along the portal vein and common hepatic artery. Given history of prior ulcers, suspect recurrence of upper gastrointestinal tract ulcer, etiology either from stomach or duodenum. No bowel obstruction is identified. Tubular air-filled structure in the right hemipelvis is believed to represent a normal appendix. Lymphatic: No definite lymphadenopathy though assessment is somewhat limited as previously mentioned due to paucity of intra-abdominal and pelvic fat. Reproductive: Unremarkable Other: Paucity of intra-abdominal and pelvic fat. Cachexia. There is a small amount of free fluid in the pelvis. Musculoskeletal: No acute nor suspicious osseous abnormalities. Review of the MIP images confirms the above findings. IMPRESSION: Free intraperitoneal air, predominantly in the upper abdomen consistent with perforated hollow viscus and potentially representing recurrence of gastrointestinal ulcer. Suspect gastric or duodenal etiology given preponderance of free air in the upper abdomen. Small amount of free fluid is noted in the pelvis. These results were called by telephone at the time of interpretation on 12/09/2017 at 1:39 am to [place] , who verbally acknowledged these results. No aortic aneurysm or dissection. No acute pulmonary embolus. Patent branch vessels off the abdominal aorta. Cachexia limiting assessment. 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 Electronically Signed   By: Ashley Royalty M.D.    On: 12/09/2017 01:43    Procedures .Critical Care Performed by: Lorayne Bender, PA-C Authorized by: Lorayne Bender, PA-C   Critical care provider statement:    Critical care time (minutes):  35   Critical care time was exclusive of:  Separately billable procedures and treating other patients   Critical care was necessary to treat or prevent imminent or life-threatening deterioration of the following conditions: Intra-abdominal surgical emergency; intra-abdominal free air.   Critical care was time spent personally by me on the following activities:  Development of treatment plan with patient or surrogate, discussions with consultants, evaluation of patient's response to treatment, obtaining history from patient or surrogate, examination of patient, ordering and performing treatments and interventions, ordering and review of laboratory studies, ordering and review of radiographic studies, pulse oximetry and re-evaluation of patient's condition   I assumed direction of critical care for this patient from another provider in my specialty: no     (including critical care time)  Medications Ordered in ED Medications  iopamidol (ISOVUE-370) 76 %  injection (has no administration in time range)  piperacillin-tazobactam (ZOSYN) IVPB 3.375 g (has no administration in time range)  fentaNYL (SUBLIMAZE) injection 50 mcg (50 mcg Intravenous Given 12/09/17 0031)  sodium chloride 0.9 % bolus 1,000 mL (0 mLs Intravenous Stopped 12/09/17 0134)  ondansetron (ZOFRAN) injection 4 mg (4 mg Intravenous Given 12/09/17 0030)  iopamidol (ISOVUE-370) 76 % injection 100 mL (100 mLs Intravenous Contrast Given 12/09/17 0108)  fentaNYL (SUBLIMAZE) injection 50 mcg (50 mcg Intravenous Given 12/09/17 0256)     Initial Impression / Assessment and Plan / ED Course  I have reviewed the triage vital signs and the nursing notes.  Pertinent labs & imaging results that were available during my care of the patient were reviewed by  me and considered in my medical decision making (see chart for details).  Clinical Course as of Dec 10 318  Wed Dec 09, 2017  0143 Spoke with the patient about his results on the CT scan.  States his pain is well controlled.   [SJ]  4503 Spoke with Dr. Barry Dienes, general surgeon. States she will come see the patient.    [SJ]  0319 Dr. Barry Dienes assessed the patient and will take him to the OR.   [SJ]    Clinical Course User Index [SJ] Joy, Shawn C, PA-C    Patient presents with sudden onset of abdominal pain.  Significantly distressed on arrival.  Initially hypotensive, improved with IV fluids. Evidence of intra-peritoneal free air with suspicion for perforated ulcer. Patient to the OR via general surgery.   Findings and plan of care discussed with Dr. Florina Ou. Dr. Florina Ou personally evaluated and examined this patient.  Vitals:   12/08/17 2330 12/09/17 0000 12/09/17 0101 12/09/17 0105  BP:  (!) 97/53  (!) 104/53  Pulse:  (!) 41  (!) 42  Resp:  17  20  Temp:   (!) 97.5 F (36.4 C)   TempSrc:      SpO2:  100%  100%  Weight: 68 kg     Height: 5\' 9"  (1.753 m)      Vitals:   12/09/17 0105 12/09/17 0145 12/09/17 0200 12/09/17 0230  BP: (!) 104/53 (!) 113/45 (!) 115/51 (!) 100/59  Pulse: (!) 42 (!) 46  (!) 47  Resp: 20 11 (!) 21 12  Temp:      TempSrc:      SpO2: 100% 100%  100%  Weight:      Height:         Final Clinical Impressions(s) / ED Diagnoses   Final diagnoses:  Perforated viscus    ED Discharge Orders    None       Layla Maw 12/09/17 0322    Molpus, Jenny Reichmann, MD 12/09/17 [phone]

## 2017-12-08 NOTE — ED Notes (Signed)
Pt tore off arm band and turned in labels to desk. This pt seen walking out the doors.

## 2017-12-08 NOTE — ED Triage Notes (Signed)
Pt arrives to ED from home via [facility]  with abd pain hx of ulcers.     118/72 hr 64 RR16 98% on roo mair with [facility].   Pt was seen on 9/4 for same.

## 2017-12-09 ENCOUNTER — Emergency Department (HOSPITAL_COMMUNITY): Payer: Self-pay | Admitting: Registered Nurse

## 2017-12-09 ENCOUNTER — Emergency Department (HOSPITAL_COMMUNITY): Payer: Self-pay

## 2017-12-09 ENCOUNTER — Encounter (HOSPITAL_COMMUNITY): Payer: Self-pay

## 2017-12-09 ENCOUNTER — Encounter (HOSPITAL_COMMUNITY): Admission: EM | Disposition: A | Discharge status: Home / Self Care | Payer: Self-pay | Source: Home / Self Care

## 2017-12-09 DIAGNOSIS — K275 Chronic or unspecified peptic ulcer, site unspecified, with perforation: Secondary | ICD-10-CM | POA: Diagnosis present

## 2017-12-09 HISTORY — PX: LAPAROTOMY: SHX154

## 2017-12-09 LAB — CBC WITH DIFFERENTIAL/PLATELET
BASOS ABS: 0 10*3/uL (ref 0.0–0.1)
Basophils Relative: 0 %
EOS PCT: 0 %
Eosinophils Absolute: 0 10*3/uL (ref 0.0–0.7)
HCT: 39.1 % (ref 39.0–52.0)
Hemoglobin: 12.8 g/dL — ABNORMAL LOW (ref 13.0–17.0)
LYMPHS PCT: 12 %
Lymphs Abs: 1.1 10*3/uL (ref 0.7–4.0)
MCH: 25.5 pg — AB (ref 26.0–34.0)
MCHC: 32.7 g/dL (ref 30.0–36.0)
MCV: 77.9 fL — AB (ref 78.0–100.0)
MONO ABS: 0.5 10*3/uL (ref 0.1–1.0)
MONOS PCT: 5 %
Neutro Abs: 7.2 10*3/uL (ref 1.7–7.7)
Neutrophils Relative %: 83 %
Platelets: 276 10*3/uL (ref 150–400)
RBC: 5.02 MIL/uL (ref 4.22–5.81)
RDW: 12.9 % (ref 11.5–15.5)
WBC: 8.8 10*3/uL (ref 4.0–10.5)

## 2017-12-09 LAB — LIPASE, BLOOD: LIPASE: 22 U/L (ref 11–51)

## 2017-12-09 LAB — COMPREHENSIVE METABOLIC PANEL
ALT: 12 U/L (ref 0–44)
AST: 19 U/L (ref 15–41)
Albumin: 3.6 g/dL (ref 3.5–5.0)
Alkaline Phosphatase: 62 U/L (ref 38–126)
Anion gap: 9 (ref 5–15)
BILIRUBIN TOTAL: 0.5 mg/dL (ref 0.3–1.2)
BUN: 34 mg/dL — AB (ref 6–20)
CO2: 22 mmol/L (ref 22–32)
CREATININE: 1.26 mg/dL — AB (ref 0.61–1.24)
Calcium: 8.8 mg/dL — ABNORMAL LOW (ref 8.9–10.3)
Chloride: 104 mmol/L (ref 98–111)
GFR calc Af Amer: >60 mL/min (ref >60)
Glucose, Bld: 94 mg/dL (ref 70–99)
Potassium: 4.1 mmol/L (ref 3.5–5.1)
Sodium: 135 mmol/L (ref 135–145)
Total Protein: 6.4 g/dL — ABNORMAL LOW (ref 6.5–8.1)

## 2017-12-09 LAB — I-STAT TROPONIN, ED: Troponin i, poc: 0 ng/mL (ref 0.00–0.08)

## 2017-12-09 LAB — POC OCCULT BLOOD, ED: Fecal Occult Bld: POSITIVE — AB

## 2017-12-09 SURGERY — LAPAROTOMY, EXPLORATORY
Anesthesia: General

## 2017-12-09 MED ORDER — PIPERACILLIN-TAZOBACTAM 3.375 G IVPB
3.3750 g | Freq: Once | INTRAVENOUS | Status: AC
  Administered 2017-12-09: 3.375 g via INTRAVENOUS
  Filled 2017-12-09: qty 50

## 2017-12-09 MED ORDER — LIDOCAINE 2% (20 MG/ML) 5 ML SYRINGE
INTRAMUSCULAR | Status: AC
  Filled 2017-12-09: qty 5

## 2017-12-09 MED ORDER — ONDANSETRON HCL 4 MG/2ML IJ SOLN
INTRAMUSCULAR | Status: AC
  Filled 2017-12-09: qty 2

## 2017-12-09 MED ORDER — KCL IN DEXTROSE-NACL 20-5-0.45 MEQ/L-%-% IV SOLN
INTRAVENOUS | Status: DC
  Administered 2017-12-09: 1000 mL via INTRAVENOUS
  Administered 2017-12-09 – 2017-12-12 (×4): via INTRAVENOUS
  Filled 2017-12-09 (×4): qty 1000

## 2017-12-09 MED ORDER — SUCCINYLCHOLINE CHLORIDE 200 MG/10ML IV SOSY
PREFILLED_SYRINGE | INTRAVENOUS | Status: DC | PRN
  Administered 2017-12-09: 140 mg via INTRAVENOUS

## 2017-12-09 MED ORDER — DIPHENHYDRAMINE HCL 12.5 MG/5ML PO ELIX: 12.5000 mg | ORAL_SOLUTION | Freq: Four times a day (QID) | ORAL | Status: DC | PRN

## 2017-12-09 MED ORDER — 0.9 % SODIUM CHLORIDE (POUR BTL) OPTIME
TOPICAL | Status: DC | PRN
  Administered 2017-12-09: 6000 mL

## 2017-12-09 MED ORDER — DIPHENHYDRAMINE HCL 50 MG/ML IJ SOLN
12.5000 mg | Freq: Four times a day (QID) | INTRAMUSCULAR | Status: DC | PRN
  Administered 2017-12-10: 12.5 mg via INTRAVENOUS
  Filled 2017-12-09: qty 1

## 2017-12-09 MED ORDER — ENOXAPARIN SODIUM 40 MG/0.4ML ~~LOC~~ SOLN
40.0000 mg | SUBCUTANEOUS | Status: DC
  Administered 2017-12-10 – 2017-12-12 (×2): 40 mg via SUBCUTANEOUS
  Filled 2017-12-09 (×3): qty 0.4

## 2017-12-09 MED ORDER — MIDAZOLAM HCL 5 MG/5ML IJ SOLN
INTRAMUSCULAR | Status: DC | PRN
  Administered 2017-12-09: 2 mg via INTRAVENOUS

## 2017-12-09 MED ORDER — PIPERACILLIN-TAZOBACTAM 3.375 G IVPB
3.3750 g | Freq: Three times a day (TID) | INTRAVENOUS | Status: DC
  Administered 2017-12-09 – 2017-12-12 (×11): 3.375 g via INTRAVENOUS
  Filled 2017-12-09 (×12): qty 50

## 2017-12-09 MED ORDER — ONDANSETRON HCL 4 MG/2ML IJ SOLN
INTRAMUSCULAR | Status: DC | PRN
  Administered 2017-12-09: 4 mg via INTRAVENOUS

## 2017-12-09 MED ORDER — ACETAMINOPHEN 10 MG/ML IV SOLN
1000.0000 mg | Freq: Four times a day (QID) | INTRAVENOUS | Status: AC
  Administered 2017-12-09 – 2017-12-10 (×4): 1000 mg via INTRAVENOUS
  Filled 2017-12-09 (×4): qty 100

## 2017-12-09 MED ORDER — DIPHENHYDRAMINE HCL 50 MG/ML IJ SOLN: 12.5000 mg | Freq: Four times a day (QID) | INTRAMUSCULAR | Status: DC | PRN

## 2017-12-09 MED ORDER — FENTANYL CITRATE (PF) 100 MCG/2ML IJ SOLN
50.0000 ug | Freq: Once | INTRAMUSCULAR | Status: AC
  Administered 2017-12-09: 50 ug via INTRAVENOUS
  Filled 2017-12-09: qty 2

## 2017-12-09 MED ORDER — MIDAZOLAM HCL 2 MG/2ML IJ SOLN
INTRAMUSCULAR | Status: AC
  Filled 2017-12-09: qty 2

## 2017-12-09 MED ORDER — METHOCARBAMOL 1000 MG/10ML IJ SOLN
500.0000 mg | Freq: Four times a day (QID) | INTRAVENOUS | Status: DC | PRN
  Filled 2017-12-09: qty 5

## 2017-12-09 MED ORDER — PHENYLEPHRINE HCL 10 MG/ML IJ SOLN
INTRAMUSCULAR | Status: DC | PRN
  Administered 2017-12-09 (×2): 80 ug via INTRAVENOUS

## 2017-12-09 MED ORDER — KCL IN DEXTROSE-NACL 20-5-0.45 MEQ/L-%-% IV SOLN
INTRAVENOUS | Status: AC
  Administered 2017-12-09: 1000 mL via INTRAVENOUS
  Filled 2017-12-09: qty 1000

## 2017-12-09 MED ORDER — PIPERACILLIN-TAZOBACTAM 3.375 G IVPB
INTRAVENOUS | Status: AC
  Filled 2017-12-09: qty 50

## 2017-12-09 MED ORDER — LACTATED RINGERS IV SOLN
INTRAVENOUS | Status: DC | PRN
  Administered 2017-12-09 (×2): via INTRAVENOUS

## 2017-12-09 MED ORDER — ACETAMINOPHEN 325 MG PO TABS: 650.0000 mg | ORAL_TABLET | Freq: Four times a day (QID) | ORAL | Status: DC | PRN

## 2017-12-09 MED ORDER — EPHEDRINE SULFATE 50 MG/ML IJ SOLN
INTRAMUSCULAR | Status: DC | PRN
  Administered 2017-12-09: 10 mg via INTRAVENOUS

## 2017-12-09 MED ORDER — DEXAMETHASONE SODIUM PHOSPHATE 10 MG/ML IJ SOLN
INTRAMUSCULAR | Status: AC
  Filled 2017-12-09: qty 1

## 2017-12-09 MED ORDER — ACETAMINOPHEN 650 MG RE SUPP: 650.0000 mg | Freq: Four times a day (QID) | RECTAL | Status: DC | PRN

## 2017-12-09 MED ORDER — SODIUM CHLORIDE 0.9% FLUSH: 9.0000 mL | INTRAVENOUS | Status: DC | PRN

## 2017-12-09 MED ORDER — ONDANSETRON HCL 4 MG/2ML IJ SOLN: 4.0000 mg | Freq: Four times a day (QID) | INTRAMUSCULAR | Status: DC | PRN

## 2017-12-09 MED ORDER — ROCURONIUM BROMIDE 10 MG/ML (PF) SYRINGE
PREFILLED_SYRINGE | INTRAVENOUS | Status: DC | PRN
  Administered 2017-12-09: 40 mg via INTRAVENOUS

## 2017-12-09 MED ORDER — PROMETHAZINE HCL 25 MG/ML IJ SOLN: 6.2500 mg | INTRAMUSCULAR | Status: DC | PRN

## 2017-12-09 MED ORDER — SCOPOLAMINE 1 MG/3DAYS TD PT72
MEDICATED_PATCH | TRANSDERMAL | Status: AC
  Filled 2017-12-09: qty 1

## 2017-12-09 MED ORDER — SUGAMMADEX SODIUM 200 MG/2ML IV SOLN
INTRAVENOUS | Status: DC | PRN
  Administered 2017-12-09: 150 mg via INTRAVENOUS

## 2017-12-09 MED ORDER — HYDROMORPHONE 1 MG/ML IV SOLN
INTRAVENOUS | Status: DC
  Administered 2017-12-09: 11:00:00 via INTRAVENOUS
  Administered 2017-12-09: 0.6 mg via INTRAVENOUS
  Administered 2017-12-10: 3.9 mg via INTRAVENOUS
  Administered 2017-12-10: 0.6 mg via INTRAVENOUS
  Administered 2017-12-11: 0 mg via INTRAVENOUS
  Administered 2017-12-11: 1.2 mg via INTRAVENOUS
  Filled 2017-12-09: qty 25

## 2017-12-09 MED ORDER — NALOXONE HCL 0.4 MG/ML IJ SOLN: 0.4000 mg | INTRAMUSCULAR | Status: DC | PRN

## 2017-12-09 MED ORDER — FENTANYL CITRATE (PF) 250 MCG/5ML IJ SOLN
INTRAMUSCULAR | Status: AC
  Filled 2017-12-09: qty 5

## 2017-12-09 MED ORDER — ALBUMIN HUMAN 5 % IV SOLN
INTRAVENOUS | Status: AC
  Filled 2017-12-09: qty 500

## 2017-12-09 MED ORDER — ALBUMIN HUMAN 5 % IV SOLN
INTRAVENOUS | Status: DC | PRN
  Administered 2017-12-09 (×2): via INTRAVENOUS

## 2017-12-09 MED ORDER — ROCURONIUM BROMIDE 10 MG/ML (PF) SYRINGE
PREFILLED_SYRINGE | INTRAVENOUS | Status: AC
  Filled 2017-12-09: qty 10

## 2017-12-09 MED ORDER — FENTANYL CITRATE (PF) 100 MCG/2ML IJ SOLN
INTRAMUSCULAR | Status: DC | PRN
  Administered 2017-12-09 (×3): 50 ug via INTRAVENOUS
  Administered 2017-12-09: 100 ug via INTRAVENOUS

## 2017-12-09 MED ORDER — PROPOFOL 10 MG/ML IV BOLUS
INTRAVENOUS | Status: AC
  Filled 2017-12-09: qty 40

## 2017-12-09 MED ORDER — SUCCINYLCHOLINE CHLORIDE 200 MG/10ML IV SOSY
PREFILLED_SYRINGE | INTRAVENOUS | Status: AC
  Filled 2017-12-09: qty 10

## 2017-12-09 MED ORDER — PROCHLORPERAZINE EDISYLATE 10 MG/2ML IJ SOLN: 5.0000 mg | Freq: Four times a day (QID) | INTRAMUSCULAR | Status: DC | PRN

## 2017-12-09 MED ORDER — HYDROMORPHONE HCL 1 MG/ML IJ SOLN
0.2500 mg | INTRAMUSCULAR | Status: DC | PRN
  Administered 2017-12-09 (×2): 0.5 mg via INTRAVENOUS

## 2017-12-09 MED ORDER — LIDOCAINE 2% (20 MG/ML) 5 ML SYRINGE
INTRAMUSCULAR | Status: DC | PRN
  Administered 2017-12-09: 75 mg via INTRAVENOUS
  Administered 2017-12-09: 25 mg via INTRAVENOUS

## 2017-12-09 MED ORDER — SCOPOLAMINE 1 MG/3DAYS TD PT72
1.0000 | MEDICATED_PATCH | TRANSDERMAL | Status: DC
  Administered 2017-12-09: 1 via TRANSDERMAL

## 2017-12-09 MED ORDER — HYDROMORPHONE HCL 1 MG/ML IJ SOLN
INTRAMUSCULAR | Status: AC
  Filled 2017-12-09: qty 1

## 2017-12-09 MED ORDER — PROCHLORPERAZINE MALEATE 10 MG PO TABS: 10.0000 mg | ORAL_TABLET | Freq: Four times a day (QID) | ORAL | Status: DC | PRN

## 2017-12-09 MED ORDER — HYDRALAZINE HCL 20 MG/ML IJ SOLN: 10.0000 mg | INTRAMUSCULAR | Status: DC | PRN

## 2017-12-09 MED ORDER — IOPAMIDOL (ISOVUE-370) INJECTION 76%
INTRAVENOUS | Status: AC
  Filled 2017-12-09: qty 100

## 2017-12-09 MED ORDER — PROPOFOL 10 MG/ML IV BOLUS
INTRAVENOUS | Status: DC | PRN
  Administered 2017-12-09: 160 mg via INTRAVENOUS
  Administered 2017-12-09: 40 mg via INTRAVENOUS

## 2017-12-09 MED ORDER — FAMOTIDINE IN NACL 20-0.9 MG/50ML-% IV SOLN
20.0000 mg | Freq: Two times a day (BID) | INTRAVENOUS | Status: DC
  Administered 2017-12-09 – 2017-12-12 (×8): 20 mg via INTRAVENOUS
  Filled 2017-12-09 (×8): qty 50

## 2017-12-09 MED ORDER — DEXAMETHASONE SODIUM PHOSPHATE 10 MG/ML IJ SOLN
INTRAMUSCULAR | Status: DC | PRN
  Administered 2017-12-09: 6 mg via INTRAVENOUS

## 2017-12-09 MED ORDER — IOPAMIDOL (ISOVUE-370) INJECTION 76%
100.0000 mL | Freq: Once | INTRAVENOUS | Status: AC | PRN
  Administered 2017-12-09: 100 mL via INTRAVENOUS

## 2017-12-09 MED ORDER — NICOTINE 21 MG/24HR TD PT24
21.0000 mg | MEDICATED_PATCH | Freq: Every day | TRANSDERMAL | Status: DC
  Administered 2017-12-09 – 2017-12-12 (×3): 21 mg via TRANSDERMAL
  Filled 2017-12-09 (×4): qty 1

## 2017-12-09 SURGICAL SUPPLY — 45 items
APPLICATOR COTTON TIP 6 STRL (MISCELLANEOUS) IMPLANT
APPLICATOR COTTON TIP 6IN STRL (MISCELLANEOUS)
BLADE EXTENDED COATED 6.5IN (ELECTRODE) ×2 IMPLANT
BLADE HEX COATED 2.75 (ELECTRODE) ×2 IMPLANT
BLADE SURG SZ10 CARB STEEL (BLADE) IMPLANT
COVER MAYO STAND STRL (DRAPES) IMPLANT
COVER SURGICAL LIGHT HANDLE (MISCELLANEOUS) ×2 IMPLANT
DRAIN CHANNEL 19F RND (DRAIN) IMPLANT
DRAPE LAPAROSCOPIC ABDOMINAL (DRAPES) ×2 IMPLANT
DRAPE SHEET LG 3/4 BI-LAMINATE (DRAPES) IMPLANT
DRAPE WARM FLUID 44X44 (DRAPE) ×2 IMPLANT
DRSG OPSITE POSTOP 4X8 (GAUZE/BANDAGES/DRESSINGS) ×2 IMPLANT
DRSG PAD ABDOMINAL 8X10 ST (GAUZE/BANDAGES/DRESSINGS) IMPLANT
ELECT REM PT RETURN 15FT ADLT (MISCELLANEOUS) ×2 IMPLANT
EVACUATOR DRAINAGE 10X20 100CC (DRAIN) IMPLANT
EVACUATOR SILICONE 100CC (DRAIN)
GAUZE SPONGE 4X4 12PLY STRL (GAUZE/BANDAGES/DRESSINGS) IMPLANT
GLOVE BIO SURGEON STRL SZ 6 (GLOVE) ×6 IMPLANT
GLOVE BIOGEL PI IND STRL 6.5 (GLOVE) ×1 IMPLANT
GLOVE BIOGEL PI IND STRL 7.0 (GLOVE) ×1 IMPLANT
GLOVE BIOGEL PI INDICATOR 6.5 (GLOVE) ×1
GLOVE BIOGEL PI INDICATOR 7.0 (GLOVE) ×1
GLOVE INDICATOR 6.5 STRL GRN (GLOVE) ×4 IMPLANT
GOWN STRL REUS W/ TWL XL LVL3 (GOWN DISPOSABLE) ×1 IMPLANT
GOWN STRL REUS W/TWL 2XL LVL3 (GOWN DISPOSABLE) ×2 IMPLANT
GOWN STRL REUS W/TWL XL LVL3 (GOWN DISPOSABLE) ×1
HANDLE SUCTION POOLE (INSTRUMENTS) ×1 IMPLANT
KIT BASIN OR (CUSTOM PROCEDURE TRAY) ×2 IMPLANT
LEGGING LITHOTOMY PAIR STRL (DRAPES) IMPLANT
NS IRRIG 1000ML POUR BTL (IV SOLUTION) ×14 IMPLANT
PACK GENERAL/GYN (CUSTOM PROCEDURE TRAY) ×2 IMPLANT
STAPLER VISISTAT 35W (STAPLE) ×2 IMPLANT
SUCTION POOLE HANDLE (INSTRUMENTS) ×2
SUT PDS AB 1 CTX 36 (SUTURE) IMPLANT
SUT PDS AB 1 TP1 96 (SUTURE) ×4 IMPLANT
SUT SILK 2 0 (SUTURE) ×1
SUT SILK 2 0 SH CR/8 (SUTURE) ×2 IMPLANT
SUT SILK 2-0 18XBRD TIE 12 (SUTURE) ×1 IMPLANT
SUT VIC AB 2-0 SH 18 (SUTURE) IMPLANT
SUT VIC AB 2-0 SH 27 (SUTURE) ×1
SUT VIC AB 2-0 SH 27XBRD (SUTURE) ×1 IMPLANT
SUT VIC AB 3-0 SH 18 (SUTURE) IMPLANT
TOWEL OR 17X26 10 PK STRL BLUE (TOWEL DISPOSABLE) ×2 IMPLANT
TRAY FOLEY MTR SLVR 16FR STAT (SET/KITS/TRAYS/PACK) ×2 IMPLANT
YANKAUER SUCT BULB TIP NO VENT (SUCTIONS) ×2 IMPLANT

## 2017-12-09 NOTE — ED Notes (Signed)
ED Provider at bedside. 

## 2017-12-09 NOTE — Anesthesia Postprocedure Evaluation (Signed)
Anesthesia Post Note  Patient: Marvin Thomas  Procedure(s) Performed: EXPLORATORY LAPAROTOMY, REPAIR PERFORATED PYLEORIC ULCER (N/A )     Patient location during evaluation: PACU Anesthesia Type: General Level of consciousness: sedated Pain management: pain level controlled Vital Signs Assessment: post-procedure vital signs reviewed and stable Respiratory status: spontaneous breathing and respiratory function stable Cardiovascular status: stable Postop Assessment: no apparent nausea or vomiting Anesthetic complications: no    Last Vitals:  Vitals:   12/09/17 1028 12/09/17 1130  BP:  135/80  Pulse:  (!) 48  Resp: (!) 21   Temp:  36.8 C  SpO2:  97%    Last Pain:  Vitals:   12/09/17 1130  TempSrc: Oral  PainSc:                  [place]

## 2017-12-09 NOTE — ED Notes (Signed)
Patient transported to OR. Zosyn sent with patient. Belongings (clothing, grey ring, cellphone and shoes) sent with pt to the OR.

## 2017-12-09 NOTE — Progress Notes (Signed)
Patient ID: Marvin Thomas, male   DOB: 29-Jan-1966, 52 y.o.   MRN: [id number]    Day of Surgery  Subjective: Pt sleepy.  No pain currently.    Objective: Vital signs in last 24 hours: Temp:  [97.2 F (36.2 C)-97.9 F (36.6 C)] 97.3 F (36.3 C) (09/18 0608) Pulse Rate:  [41-59] 49 (09/18 0608) Resp:  [10-21] 16 (09/18 0608) BP: (97-154)/(45-87) 154/75 (09/18 0608) SpO2:  [96 %-100 %] 96 % (09/18 0608) Weight:  [68 kg] 68 kg (09/17 2330) Last BM Date: 12/08/17  Intake/Output from previous day: 09/17 0701 - 09/18 0700 In: 2074.1 [I.V.:1534.1; NG/GT:40; IV Piggyback:500] Out: 280 [Urine:175; Emesis/NG output:100; Blood:5] Intake/Output this shift: No intake/output data recorded.  PE: HEENT: very sunken face with prominent cheek bones Heart: regular Lungs: CTAB Abd: soft, appropriately tender, +BS, ND, midline incision is c/d/i with staples and honeycomb dressing in place.  NGT in place with about 250cc of bilious output  Lab Results:  Recent Labs    12/08/17 1232 12/09/17 0046  WBC 7.4 8.8  HGB 13.1 12.8*  HCT 42.3 39.1  PLT 278 276   BMET Recent Labs    12/08/17 1232 12/09/17 0046  NA 135 135  K 4.5 4.1  CL 103 104  CO2 24 22  GLUCOSE 101* 94  BUN 28* 34*  CREATININE 1.26* 1.26*  CALCIUM 9.0 8.8*   PT/INR No results for input(s): LABPROT, INR in the last 72 hours. CMP     Component Value Date/Time   NA 135 12/09/2017 0046   K 4.1 12/09/2017 0046   CL 104 12/09/2017 0046   CO2 22 12/09/2017 0046   GLUCOSE 94 12/09/2017 0046   BUN 34 (H) 12/09/2017 0046   CREATININE 1.26 (H) 12/09/2017 0046   CREATININE 0.84 01/30/2014 1020   CALCIUM 8.8 (L) 12/09/2017 0046   PROT 6.4 (L) 12/09/2017 0046   ALBUMIN 3.6 12/09/2017 0046   AST 19 12/09/2017 0046   ALT 12 12/09/2017 0046   ALKPHOS 62 12/09/2017 0046   BILITOT 0.5 12/09/2017 0046   GFRNONAA >60 12/09/2017 0046   GFRNONAA >89 01/30/2014 1020   GFRAA >60 12/09/2017 0046   GFRAA >89 01/30/2014 1020     Lipase     Component Value Date/Time   LIPASE 22 12/09/2017 0046       Studies/Results: Ct Angio Chest/abd/pel For Dissection W And/or Wo Contrast  Result Date: 12/09/2017 CLINICAL DATA:  Nausea and vomiting. Patient believes he as peptic ulcer disease. Chest, abdominal and back pain. EXAM: CT ANGIOGRAPHY CHEST, ABDOMEN AND PELVIS TECHNIQUE: Multidetector CT imaging through the chest, abdomen and pelvis was performed using the standard protocol during bolus administration of intravenous contrast. Multiplanar reconstructed images and MIPs were obtained and reviewed to evaluate the vascular anatomy. CONTRAST:  171mL ISOVUE-370 IOPAMIDOL (ISOVUE-370) INJECTION 76% COMPARISON:  CT chest 11/16/2008 and CT abdomen 11/08/2012 FINDINGS: CTA CHEST FINDINGS Cardiovascular: Heart size is normal. No pericardial effusion, aortic aneurysm or dissection. No pulmonary embolus. Great vessels are conventional and branch pattern without stenosis or dissection. Mediastinum/Nodes: No enlarged mediastinal, hilar, or axillary lymph nodes. Thyroid gland, trachea, and esophagus demonstrate no significant findings. 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 Large right upper lobe bulla is identified measuring 10.7 x 7.7 x 12.7 cm. No pulmonary consolidation or dominant mass. No pneumothorax or effusion. Musculoskeletal: Degenerative changes are noted along the thoracic spine without aggressive osseous lesions. Review of the MIP images  confirms the above findings. CTA ABDOMEN AND PELVIS FINDINGS VASCULAR Aorta: Nonaneurysmal.  No dissection. Celiac: Patent without significant stenosis, aneurysm or dissection. SMA: Patent Renals: Patent bilateral renal arteries without significant stenosis, aneurysm or evidence of fibromuscular dysplasia. IMA: Patent Inflow: Patent without evidence of aneurysm, dissection, vasculitis or significant stenosis.  Veins: No obvious venous abnormality within the limitations of this arterial phase study. Review of the MIP images confirms the above findings. NON-VASCULAR Hepatobiliary: Physiologic distention of the gallbladder without stones. No space-occupying mass of the liver. Pancreas: The pancreatic duct is visualized but is normal caliber. No mass or definite inflammation though assessment for inflammation is limited due to lack of intra-abdominal fat. Spleen: No splenomegaly. Adrenals/Urinary Tract: Adrenal mass. Cortical symmetric enhancement of both kidneys without nephrolithiasis, renal mass nor obstructive uropathy. The urinary bladder is physiologically distended. Stomach/Bowel: Free intraperitoneal air is noted within the upper abdomen with speckled air like lucencies overlying the stomach and extending along the portal vein and common hepatic artery. Given history of prior ulcers, suspect recurrence of upper gastrointestinal tract ulcer, etiology either from stomach or duodenum. No bowel obstruction is identified. Tubular air-filled structure in the right hemipelvis is believed to represent a normal appendix. Lymphatic: No definite lymphadenopathy though assessment is somewhat limited as previously mentioned due to paucity of intra-abdominal and pelvic fat. Reproductive: Unremarkable Other: Paucity of intra-abdominal and pelvic fat. Cachexia. There is a small amount of free fluid in the pelvis. Musculoskeletal: No acute nor suspicious osseous abnormalities. Review of the MIP images confirms the above findings. IMPRESSION: Free intraperitoneal air, predominantly in the upper abdomen consistent with perforated hollow viscus and potentially representing recurrence of gastrointestinal ulcer. Suspect gastric or duodenal etiology given preponderance of free air in the upper abdomen. Small amount of free fluid is noted in the pelvis. These results were called by telephone at the time of interpretation on 12/09/2017 at 1:39  am to [place] , who verbally acknowledged these results. No aortic aneurysm or dissection. No acute pulmonary embolus. Patent branch vessels off the abdominal aorta. Cachexia limiting assessment. 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 Electronically Signed   By: Ashley Royalty M.D.   On: 12/09/2017 01:43    Anti-infectives: Anti-infectives (From admission, onward)   Start     Dose/Rate Route Frequency Ordered Stop   12/09/17 1400  piperacillin-tazobactam (ZOSYN) IVPB 3.375 g     3.375 g 12.5 mL/hr over 240 Minutes Intravenous Every 8 hours 12/09/17 0615     12/09/17 0336  piperacillin-tazobactam (ZOSYN) 3.375 GM/50ML IVPB  Status:  Discontinued    Note to Pharmacy:  Enrigue Catena   : cabinet override      12/09/17 0336 12/09/17 0345   12/09/17 0315  piperacillin-tazobactam (ZOSYN) IVPB 3.375 g     3.375 g 100 mL/hr over 30 Minutes Intravenous  Once 12/09/17 0307 12/09/17 0432       Assessment/Plan Perforated pyloric ulcer, POD 0 s/p ex lap with graham patch by Dr. Barry Dienes 12-09-17 -start to mobilize today -h. Pylori and HIV added on to labs. -patient is very skinny with a sunken face and prominent cheek bones.  Albumin is surprisingly normal at 3.6 last night. -cont NGT for the first couple of days.  Probable need for UGI prior to removal of NGT last this week, ? Friday. -pain control -on pepcid daily  Tobacco abuse -significant blebs on CT scan last night -pulm toilet and IS  FEN - IVFs/NGT/NPO VTE - SCDs/Lovenox to start 9-19 ID -  Zosyn 9-18 -->   LOS: 0 days    Henreitta Cea , [facility] 12/09/2017, 10:08 AM Pager: [phone]

## 2017-12-09 NOTE — Anesthesia Procedure Notes (Signed)
Procedure Name: Intubation Date/Time: 12/09/2017 3:59 AM Performed by: Lissa Morales, CRNA Pre-anesthesia Checklist: Patient identified, Emergency Drugs available, Suction available and Patient being monitored Patient Re-evaluated:Patient Re-evaluated prior to induction Oxygen Delivery Method: Circle system utilized Preoxygenation: Pre-oxygenation with 100% oxygen Induction Type: IV induction, Cricoid Pressure applied and Rapid sequence Laryngoscope Size: Mac and 4 Grade View: Grade I Tube type: Oral Tube size: 7.5 mm Number of attempts: 1 Airway Equipment and Method: Stylet and Oral airway Placement Confirmation: ETT inserted through vocal cords under direct vision,  positive ETCO2 and breath sounds checked- equal and bilateral Tube secured with: Tape Dental Injury: Teeth and Oropharynx as per pre-operative assessment

## 2017-12-09 NOTE — Op Note (Signed)
PRE-OPERATIVE DIAGNOSIS: free air  POST-OPERATIVE DIAGNOSIS:  Perforated pyloric ulcer  PROCEDURE:  Procedure(s): Exploratory laparotomy, graham patch repair of perforated ulcer  SURGEON:  Surgeon(s): Stark Klein, MD  ANESTHESIA:   general  DRAINS: none   LOCAL MEDICATIONS USED:  NONE  SPECIMEN:  No Specimen  DISPOSITION OF SPECIMEN:  N/A  COUNTS:  YES  DICTATION: .Dragon Dictation  PLAN OF CARE: Admit to inpatient   PATIENT DISPOSITION:  PACU - hemodynamically stable.  FINDINGS:  Perforated pyloric ulcer with associated inflammation and gross contamination of abdomen  EBL: min   PROCEDURE:  Patient was identified in the holding area and taken to the operating room where he was placed supine on the operating room table.  General endotracheal anesthesia was induced.  A Foley catheter was placed as well as a nasogastric tube.  His abdomen was prepped and draped in sterile fashion.  A timeout was performed according to the surgical safety checklist.  When all was correct, we continued.  A vertical midline incision was made in the upper abdomen with a #10 blade.  The cautery was used to divide the subcutaneous tissues as well as the fascia.  Gross contamination was seen in the upper abdomen immediately.  There was visible a approximately an 8 mm ulcer on the superior and anterior surface of the pylorus.  2-0 silk sutures were used to secure segment of omentum over the defect.  Exploration did not reveal any evidence of mass in the liver or along the peritoneum or in the pelvis.  The pylorus was thickened, but this is consistent with inflammatory reaction.  The gallbladder was distended but the wall did not appear thickened.  The abdomen was then irrigated copiously until the irrigant returned clear.  The repair was reexamined to make sure there was no evidence of visible defect and that the stitches were intact.  The position of the nasogastric tube was evaluated and was  appropriate.  The fascia was then closed with running #1 looped PDS suture.  The skin was irrigated and then closed with skin staples.  The abdomen was then cleaned, dried, and dressed with a island dressing.  The patient was allowed to emerge from anesthesia and taken to the PACU in stable condition.  Needle, sponge, and instrument counts were correct x2.

## 2017-12-09 NOTE — H&P (Signed)
Marvin Thomas is an 52 y.o. male.   Chief Complaint: severe abdominal pain HPI: Pt is a 52 yo M who presents with 4 days of severe abdominal pain that has acutely worsened today.  He has also had nausea.  He has been diagnosed with ulcers clinically in the recent past and has been "prescribed medicine, but never picked it up."  He has not previously had endoscopy.  He denies weight loss other than the last few days as he has been unable to eat anything.  The pain feels worse when lying flat or moving.  He tried tylenol and ibuprofen for the pain but it has not helped.  He also feels pain in his left shoulder and across his upper abdomen to the flanks.  He feels shortness of breath when lying flat.  He had a bloody bowel movement today.  He has a known history of gallstones.  He denies significant alcohol use, but has smoked for 36 years.    Past Medical History:  Diagnosis Date  . Anemia Dx 2014  . Gallstones   . Hemothorax   . Ulcer     PMH H/o ulcer disease.  Family History  Problem Relation Age of Onset  . Diabetes Mother   . Hypertension Mother   . Heart disease Mother        x 3   . Heart disease Brother   . Hypertension Brother   . Diabetes Other   . Hypertension Brother   . Hypertension Brother   . Cancer Neg Hx    Social History:  reports that he has been smoking cigarettes. He has a 46.50 pack-year smoking history. He has never used smokeless tobacco. He reports that he drinks alcohol. He reports that he has current or past drug history. Drug: Marijuana.  Allergies:  Allergies  Allergen Reactions  . Aspirin Nausea Only   Meds: Current Meds  Medication Sig  . acetaminophen (TYLENOL) 500 MG tablet Take 1,000 mg by mouth every 6 (six) hours as needed for mild pain.     Results for orders placed or performed during the hospital encounter of 12/08/17 (from the past 48 hour(s))  Comprehensive metabolic panel     Status: Abnormal   Collection Time: 12/09/17 12:46 AM   Result Value Ref Range   Sodium 135 135 - 145 mmol/L   Potassium 4.1 3.5 - 5.1 mmol/L   Chloride 104 98 - 111 mmol/L   CO2 22 22 - 32 mmol/L   Glucose, Bld 94 70 - 99 mg/dL   BUN 34 (H) 6 - 20 mg/dL   Creatinine, Ser 1.26 (H) 0.61 - 1.24 mg/dL   Calcium 8.8 (L) 8.9 - 10.3 mg/dL   Total Protein 6.4 (L) 6.5 - 8.1 g/dL   Albumin 3.6 3.5 - 5.0 g/dL   AST 19 15 - 41 U/L   ALT 12 0 - 44 U/L   Alkaline Phosphatase 62 38 - 126 U/L   Total Bilirubin 0.5 0.3 - 1.2 mg/dL   GFR calc non Af Amer >60 >60 mL/min   GFR calc Af Amer >60 >60 mL/min    Comment: (NOTE) The eGFR has been calculated using the CKD EPI equation. This calculation has not been validated in all clinical situations. eGFR's persistently <60 mL/min signify possible Chronic Kidney Disease.    Anion gap 9 5 - 15    Comment: Performed at [facility], [place] [address], [place], [place] [address]  Lipase, blood  Status: None   Collection Time: 12/09/17 12:46 AM  Result Value Ref Range   Lipase 22 11 - 51 U/L    Comment: Performed at [facility], [place] [address] [place], [place] [address]  CBC with Differential     Status: Abnormal   Collection Time: 12/09/17 12:46 AM  Result Value Ref Range   WBC 8.8 4.0 - 10.5 K/uL   RBC 5.02 4.22 - 5.81 MIL/uL   Hemoglobin 12.8 (L) 13.0 - 17.0 g/dL   HCT 39.1 39.0 - 52.0 %   MCV 77.9 (L) 78.0 - 100.0 fL   MCH 25.5 (L) 26.0 - 34.0 pg   MCHC 32.7 30.0 - 36.0 g/dL   RDW 12.9 11.5 - 15.5 %   Platelets 276 150 - 400 K/uL   Neutrophils Relative % 83 %   Neutro Abs 7.2 1.7 - 7.7 K/uL   Lymphocytes Relative 12 %   Lymphs Abs 1.1 0.7 - 4.0 K/uL   Monocytes Relative 5 %   Monocytes Absolute 0.5 0.1 - 1.0 K/uL   Eosinophils Relative 0 %   Eosinophils Absolute 0.0 0.0 - 0.7 K/uL   Basophils Relative 0 %   Basophils Absolute 0.0 0.0 - 0.1 K/uL    Comment: Performed at [facility], [place] [address] [place], [place] [address]  I-stat  troponin, ED     Status: None   Collection Time: 12/09/17 12:52 AM  Result Value Ref Range   Troponin i, poc 0.00 0.00 - 0.08 ng/mL   Comment 3            Comment: Due to the release kinetics of cTnI, a negative result within the first hours of the onset of symptoms does not rule out myocardial infarction with certainty. If myocardial infarction is still suspected, repeat the test at appropriate intervals.   POC occult blood, ED Provider will collect     Status: Abnormal   Collection Time: 12/09/17  1:49 AM  Result Value Ref Range   Fecal Occult Bld POSITIVE (A) NEGATIVE   Ct Angio Chest/abd/pel For Dissection W And/or Wo Contrast  Result Date: 12/09/2017 CLINICAL DATA:  Nausea and vomiting. Patient believes he as peptic ulcer disease. Chest, abdominal and back pain. EXAM: CT ANGIOGRAPHY CHEST, ABDOMEN AND PELVIS TECHNIQUE: Multidetector CT imaging through the chest, abdomen and pelvis was performed using the standard protocol during bolus administration of intravenous contrast. Multiplanar reconstructed images and MIPs were obtained and reviewed to evaluate the vascular anatomy. CONTRAST:  145m ISOVUE-370 IOPAMIDOL (ISOVUE-370) INJECTION 76% COMPARISON:  CT chest 11/16/2008 and CT abdomen 11/08/2012 FINDINGS: CTA CHEST FINDINGS Cardiovascular: Heart size is normal. No pericardial effusion, aortic aneurysm or dissection. No pulmonary embolus. Great vessels are conventional and branch pattern without stenosis or dissection. Mediastinum/Nodes: No enlarged mediastinal, hilar, or axillary lymph nodes. Thyroid gland, trachea, and esophagus demonstrate no significant findings. 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 Large right upper lobe bulla is identified measuring 10.7 x 7.7 x 12.7 cm. No pulmonary consolidation or dominant mass. No pneumothorax or effusion. Musculoskeletal: Degenerative changes are noted along the  thoracic spine without aggressive osseous lesions. Review of the MIP images confirms the above findings. CTA ABDOMEN AND PELVIS FINDINGS VASCULAR Aorta: Nonaneurysmal.  No dissection. Celiac: Patent without significant stenosis, aneurysm or dissection. SMA: Patent Renals: Patent bilateral renal arteries without significant stenosis, aneurysm or evidence of fibromuscular dysplasia. IMA: Patent Inflow: Patent without evidence of  aneurysm, dissection, vasculitis or significant stenosis. Veins: No obvious venous abnormality within the limitations of this arterial phase study. Review of the MIP images confirms the above findings. NON-VASCULAR Hepatobiliary: Physiologic distention of the gallbladder without stones. No space-occupying mass of the liver. Pancreas: The pancreatic duct is visualized but is normal caliber. No mass or definite inflammation though assessment for inflammation is limited due to lack of intra-abdominal fat. Spleen: No splenomegaly. Adrenals/Urinary Tract: Adrenal mass. Cortical symmetric enhancement of both kidneys without nephrolithiasis, renal mass nor obstructive uropathy. The urinary bladder is physiologically distended. Stomach/Bowel: Free intraperitoneal air is noted within the upper abdomen with speckled air like lucencies overlying the stomach and extending along the portal vein and common hepatic artery. Given history of prior ulcers, suspect recurrence of upper gastrointestinal tract ulcer, etiology either from stomach or duodenum. No bowel obstruction is identified. Tubular air-filled structure in the right hemipelvis is believed to represent a normal appendix. Lymphatic: No definite lymphadenopathy though assessment is somewhat limited as previously mentioned due to paucity of intra-abdominal and pelvic fat. Reproductive: Unremarkable Other: Paucity of intra-abdominal and pelvic fat. Cachexia. There is a small amount of free fluid in the pelvis. Musculoskeletal: No acute nor suspicious  osseous abnormalities. Review of the MIP images confirms the above findings. IMPRESSION: Free intraperitoneal air, predominantly in the upper abdomen consistent with perforated hollow viscus and potentially representing recurrence of gastrointestinal ulcer. Suspect gastric or duodenal etiology given preponderance of free air in the upper abdomen. Small amount of free fluid is noted in the pelvis. These results were called by telephone at the time of interpretation on 12/09/2017 at 1:39 am to [place] , who verbally acknowledged these results. No aortic aneurysm or dissection. No acute pulmonary embolus. Patent branch vessels off the abdominal aorta. Cachexia limiting assessment. 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 Electronically Signed   By: Ashley Royalty M.D.   On: 12/09/2017 01:43    Review of Systems  Constitutional: Negative.   HENT: Negative.   Eyes: Negative.   Respiratory: Positive for shortness of breath.   Cardiovascular: Negative.   Gastrointestinal: Positive for abdominal pain, blood in stool and nausea.  Genitourinary: Negative.   Musculoskeletal:       Left shoulder and back pain.  Skin: Negative.   Neurological: Positive for weakness.  Endo/Heme/Allergies: Negative.   Psychiatric/Behavioral: Negative.     Blood pressure (!) 100/59, pulse (!) 47, temperature (!) 97.5 F (36.4 C), resp. rate 12, height 5' 9"  (1.753 m), weight 68 kg, SpO2 100 %. Physical Exam  Constitutional: He is oriented to person, place, and time. He appears well-developed. He appears distressed.  Very thin  HENT:  Head: Normocephalic and atraumatic.  Right Ear: External ear normal.  Left Ear: External ear normal.  Eyes: Pupils are equal, round, and reactive to light. Conjunctivae and EOM are normal. Right eye exhibits no discharge. Left eye exhibits no discharge. No scleral icterus.  Neck: Neck supple. No tracheal deviation present.  Cardiovascular: Regular rhythm, normal heart sounds and  intact distal pulses.  No murmur heard. bradycardic  Respiratory: Effort normal. No respiratory distress. He has no wheezes. He has no rales.  Decreased breath sounds bilaterally  GI: He exhibits distension. He exhibits no mass. There is tenderness. There is rebound and guarding.  Musculoskeletal: Normal range of motion. He exhibits no edema, tenderness or deformity.  Neurological: He is oriented to person, place, and time. Coordination normal.  Skin: Skin is warm and dry. No rash noted.  He is not diaphoretic. No erythema. No pallor.  Psychiatric: He has a normal mood and affect. His behavior is normal. Judgment and thought content normal.     Assessment/Plan Perforated viscus Bradycardia Tobacco abuse Acute kidney injury Emphysema Blood in stool  Likely perforated ulcer from history.  Imaging shows most free air in the upper abdomen.   IV fluids IV antibiotics To OR for exploratory laparotomy.  I discussed with the patient that if this is an ulcer that we will repair it and if it is a bowel leak, we will remove the leaking segment and either put it together or create an ostomy depending on the location.  I discussed risks of surgery including continued infection, leak, risk of prolonged hospitalization and NPO status and more.  He understands and wishes to proceed.     Stark Klein, MD 12/09/2017, 3:08 AM

## 2017-12-09 NOTE — ED Notes (Signed)
Patient transported to CT 

## 2017-12-09 NOTE — Plan of Care (Signed)
Pt lying in bed w/ no complaints. Mid abd incision with honeycomb dressing in place. Ice pack applied. No complaints or concerns noted at this time. Will continue to monitor.

## 2017-12-09 NOTE — Transfer of Care (Signed)
Immediate Anesthesia Transfer of Care Note  Patient: Marvin Thomas  Procedure(s) Performed: EXPLORATORY LAPAROTOMY, REPAIR PERFORATED PYLEORIC ULCER (N/A )  Patient Location: PACU  Anesthesia Type:General  Level of Consciousness: awake, alert , oriented and patient cooperative  Airway & Oxygen Therapy: Patient Spontanous Breathing and Patient connected to face mask oxygen  Post-op Assessment: Report given to RN, Post -op Vital signs reviewed and stable and Patient moving all extremities X 4  Post vital signs: stable  Last Vitals:  Vitals Value Taken Time  BP 150/72 12/09/2017  5:02 AM  Temp    Pulse 57 12/09/2017  5:07 AM  Resp 19 12/09/2017  5:07 AM  SpO2 100 % 12/09/2017  5:07 AM  Vitals shown include unvalidated device data.  Last Pain:  Vitals:   12/09/17 0502  TempSrc:   PainSc: (P) Asleep         Complications: No apparent anesthesia complications

## 2017-12-09 NOTE — Anesthesia Preprocedure Evaluation (Signed)
Anesthesia Evaluation  Patient identified by MRN, date of birth, ID band Patient awake    Reviewed: Allergy & Precautions, NPO status , Patient's Chart, lab work & pertinent test results  History of Anesthesia Complications Negative for: history of anesthetic complications  Airway Mallampati: II  TM Distance: >3 FB Neck ROM: Full    Dental  (+) Poor Dentition, Dental Advisory Given   Pulmonary Current Smoker,    Pulmonary exam normal        Cardiovascular negative cardio ROS   Rhythm:Regular Rate:Bradycardia     Neuro/Psych negative neurological ROS  negative psych ROS   GI/Hepatic Neg liver ROS,   Endo/Other  negative endocrine ROS  Renal/GU negative Renal ROS  negative genitourinary   Musculoskeletal negative musculoskeletal ROS (+)   Abdominal   Peds negative pediatric ROS (+)  Hematology negative hematology ROS (+)   Anesthesia Other Findings   Reproductive/Obstetrics negative OB ROS                             Anesthesia Physical Anesthesia Plan  ASA: II and emergent  Anesthesia Plan: General   Post-op Pain Management:    Induction: Intravenous, Rapid sequence and Cricoid pressure planned  PONV Risk Score and Plan: 3 and Ondansetron, Dexamethasone and Scopolamine patch - Pre-op  Airway Management Planned: Oral ETT  Additional Equipment:   Intra-op Plan:   Post-operative Plan: Extubation in OR  Informed Consent: I have reviewed the patients History and Physical, chart, labs and discussed the procedure including the risks, benefits and alternatives for the proposed anesthesia with the patient or authorized representative who has indicated his/her understanding and acceptance.   Dental advisory given  Plan Discussed with: CRNA, Anesthesiologist and Surgeon  Anesthesia Plan Comments:         Anesthesia Quick Evaluation

## 2017-12-10 ENCOUNTER — Encounter (HOSPITAL_COMMUNITY): Payer: Self-pay | Admitting: General Surgery

## 2017-12-10 LAB — CBC
HEMATOCRIT: 34.6 % — AB (ref 39.0–52.0)
Hemoglobin: 11.3 g/dL — ABNORMAL LOW (ref 13.0–17.0)
MCH: 25.3 pg — ABNORMAL LOW (ref 26.0–34.0)
MCHC: 32.7 g/dL (ref 30.0–36.0)
MCV: 77.4 fL — ABNORMAL LOW (ref 78.0–100.0)
PLATELETS: 200 10*3/uL (ref 150–400)
RBC: 4.47 MIL/uL (ref 4.22–5.81)
RDW: 13 % (ref 11.5–15.5)
WBC: 8.8 10*3/uL (ref 4.0–10.5)

## 2017-12-10 LAB — BASIC METABOLIC PANEL
ANION GAP: 8 (ref 5–15)
BUN: 20 mg/dL (ref 6–20)
CALCIUM: 8.4 mg/dL — AB (ref 8.9–10.3)
CO2: 22 mmol/L (ref 22–32)
CREATININE: 0.91 mg/dL (ref 0.61–1.24)
Chloride: 103 mmol/L (ref 98–111)
GFR calc non Af Amer: >60 mL/min (ref >60)
Glucose, Bld: 100 mg/dL — ABNORMAL HIGH (ref 70–99)
Potassium: 4.4 mmol/L (ref 3.5–5.1)
SODIUM: 133 mmol/L — AB (ref 135–145)

## 2017-12-10 LAB — HIV ANTIBODY (ROUTINE TESTING W REFLEX): HIV SCREEN 4TH GENERATION: NONREACTIVE

## 2017-12-10 MED ORDER — ALBUTEROL SULFATE (2.5 MG/3ML) 0.083% IN NEBU: 2.5000 mg | INHALATION_SOLUTION | Freq: Four times a day (QID) | RESPIRATORY_TRACT | Status: DC | PRN

## 2017-12-10 NOTE — Progress Notes (Signed)
[facility] Progress Note  1 Day Post-Op  Subjective: CC:  Denies pain. Eager to eat. Denies flatus. Foley still in. Mobilizing in hallway. 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 Denies NSAID use.   Objective: Vital signs in last 24 hours: Temp:  [97.6 F (36.4 C)-98.8 F (37.1 C)] 97.6 F (36.4 C) (09/19 0603) Pulse Rate:  [45-54] 47 (09/19 0603) Resp:  [13-28] 13 (09/19 0836) BP: (114-152)/(68-80) 123/75 (09/19 0603) SpO2:  [93 %-99 %] 99 % (09/19 0836) Last BM Date: 12/08/17(per pt)  Intake/Output from previous day: 09/18 0701 - 09/19 0700 In: 2165.7 [I.V.:1673.8; IV Piggyback:491.9] Out: 9323 [Urine:1075; Emesis/NG output:600] Intake/Output this shift: No intake/output data recorded.  PE: Gen:  Alert, NAD, chronically ill appearing, cachectic  Card:  Regular rate and rhythm, pedal pulses 2+ BL Pulm:  Normal effort, clear to auscultation bilaterally Abd: Soft, non-tender, midline incision clean and dry, hypoactive BS Skin: warm and dry, no rashes  Psych: A&Ox3   Lab Results:  Recent Labs    12/09/17 0046 12/10/17 0357  WBC 8.8 8.8  HGB 12.8* 11.3*  HCT 39.1 34.6*  PLT 276 200   BMET Recent Labs    12/09/17 0046 12/10/17 0357  NA 135 133*  K 4.1 4.4  CL 104 103  CO2 22 22  GLUCOSE 94 100*  BUN 34* 20  CREATININE 1.26* 0.91  CALCIUM 8.8* 8.4*   PT/INR No results for input(s): LABPROT, INR in the last 72 hours. CMP     Component Value Date/Time   NA 133 (L) 12/10/2017 0357   K 4.4 12/10/2017 0357   CL 103 12/10/2017 0357   CO2 22 12/10/2017 0357   GLUCOSE 100 (H) 12/10/2017 0357   BUN 20 12/10/2017 0357   CREATININE 0.91 12/10/2017 0357   CREATININE 0.84 01/30/2014 1020   CALCIUM 8.4 (L) 12/10/2017 0357   PROT 6.4 (L) 12/09/2017 0046   ALBUMIN 3.6 12/09/2017 0046   AST 19 12/09/2017 0046   ALT 12 12/09/2017 0046   ALKPHOS 62 12/09/2017 0046   BILITOT 0.5 12/09/2017 0046   GFRNONAA >60 12/10/2017  0357   GFRNONAA >89 01/30/2014 1020   GFRAA >60 12/10/2017 0357   GFRAA >89 01/30/2014 1020   Lipase     Component Value Date/Time   LIPASE 22 12/09/2017 0046       Studies/Results: Ct Angio Chest/abd/pel For Dissection W And/or Wo Contrast  Result Date: 12/09/2017 CLINICAL DATA:  Nausea and vomiting. Patient believes he as peptic ulcer disease. Chest, abdominal and back pain. EXAM: CT ANGIOGRAPHY CHEST, ABDOMEN AND PELVIS TECHNIQUE: Multidetector CT imaging through the chest, abdomen and pelvis was performed using the standard protocol during bolus administration of intravenous contrast. Multiplanar reconstructed images and MIPs were obtained and reviewed to evaluate the vascular anatomy. CONTRAST:  180mL ISOVUE-370 IOPAMIDOL (ISOVUE-370) INJECTION 76% COMPARISON:  CT chest 11/16/2008 and CT abdomen 11/08/2012 FINDINGS: CTA CHEST FINDINGS Cardiovascular: Heart size is normal. No pericardial effusion, aortic aneurysm or dissection. No pulmonary embolus. Great vessels are conventional and branch pattern without stenosis or dissection. Mediastinum/Nodes: No enlarged mediastinal, hilar, or axillary lymph nodes. Thyroid gland, trachea, and esophagus demonstrate no significant findings. 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 Large right upper lobe bulla is identified measuring 10.7 x 7.7 x 12.7 cm. No pulmonary consolidation or dominant mass. No pneumothorax or effusion. Musculoskeletal: Degenerative changes are noted along the thoracic spine  without aggressive osseous lesions. Review of the MIP images confirms the above findings. CTA ABDOMEN AND PELVIS FINDINGS VASCULAR Aorta: Nonaneurysmal.  No dissection. Celiac: Patent without significant stenosis, aneurysm or dissection. SMA: Patent Renals: Patent bilateral renal arteries without significant stenosis, aneurysm or evidence of fibromuscular dysplasia. IMA: Patent  Inflow: Patent without evidence of aneurysm, dissection, vasculitis or significant stenosis. Veins: No obvious venous abnormality within the limitations of this arterial phase study. Review of the MIP images confirms the above findings. NON-VASCULAR Hepatobiliary: Physiologic distention of the gallbladder without stones. No space-occupying mass of the liver. Pancreas: The pancreatic duct is visualized but is normal caliber. No mass or definite inflammation though assessment for inflammation is limited due to lack of intra-abdominal fat. Spleen: No splenomegaly. Adrenals/Urinary Tract: Adrenal mass. Cortical symmetric enhancement of both kidneys without nephrolithiasis, renal mass nor obstructive uropathy. The urinary bladder is physiologically distended. Stomach/Bowel: Free intraperitoneal air is noted within the upper abdomen with speckled air like lucencies overlying the stomach and extending along the portal vein and common hepatic artery. Given history of prior ulcers, suspect recurrence of upper gastrointestinal tract ulcer, etiology either from stomach or duodenum. No bowel obstruction is identified. Tubular air-filled structure in the right hemipelvis is believed to represent a normal appendix. Lymphatic: No definite lymphadenopathy though assessment is somewhat limited as previously mentioned due to paucity of intra-abdominal and pelvic fat. Reproductive: Unremarkable Other: Paucity of intra-abdominal and pelvic fat. Cachexia. There is a small amount of free fluid in the pelvis. Musculoskeletal: No acute nor suspicious osseous abnormalities. Review of the MIP images confirms the above findings. IMPRESSION: Free intraperitoneal air, predominantly in the upper abdomen consistent with perforated hollow viscus and potentially representing recurrence of gastrointestinal ulcer. Suspect gastric or duodenal etiology given preponderance of free air in the upper abdomen. Small amount of free fluid is noted in the  pelvis. These results were called by telephone at the time of interpretation on 12/09/2017 at 1:39 am to [place] , who verbally acknowledged these results. No aortic aneurysm or dissection. No acute pulmonary embolus. Patent branch vessels off the abdominal aorta. Cachexia limiting assessment. 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 Electronically Signed   By: Ashley Royalty M.D.   On: 12/09/2017 01:43    Anti-infectives: Anti-infectives (From admission, onward)   Start     Dose/Rate Route Frequency Ordered Stop   12/09/17 1400  piperacillin-tazobactam (ZOSYN) IVPB 3.375 g     3.375 g 12.5 mL/hr over 240 Minutes Intravenous Every 8 hours 12/09/17 0615     12/09/17 0336  piperacillin-tazobactam (ZOSYN) 3.375 GM/50ML IVPB  Status:  Discontinued    Note to Pharmacy:  Enrigue Catena   : cabinet override      12/09/17 0336 12/09/17 0345   12/09/17 0315  piperacillin-tazobactam (ZOSYN) IVPB 3.375 g     3.375 g 100 mL/hr over 30 Minutes Intravenous  Once 12/09/17 0307 12/09/17 0432     Assessment/Plan Perforated pyloric ulcer, POD #1 s/p ex lap with graham patch by Dr. Barry Dienes 12-09-17 -start to mobilize  - follow results H Pylori -cont NGT to LIWS - UGI tomorrow to r/o surgical leak prior to removal of NG tube  - pain control - continue pepcid   Tobacco abuse -significant blebs on CT scan 9/17 -pulm toilet and IS, albuterol nebs PRN  FEN - IVFs/NGT/NPO VTE - SCDs/Lovenox  ID - Zosyn 9/18 >> (day#2) Foley - DC today 9/19  LOS: 1 day    Marvin Dredge, PA-C [facility] Pager: [phone]

## 2017-12-10 NOTE — Progress Notes (Signed)
I have reviewed and concur with this student's documentation.   

## 2017-12-11 ENCOUNTER — Inpatient Hospital Stay (HOSPITAL_COMMUNITY): Payer: Self-pay

## 2017-12-11 LAB — BASIC METABOLIC PANEL
ANION GAP: 9 (ref 5–15)
BUN: 18 mg/dL (ref 6–20)
CO2: 26 mmol/L (ref 22–32)
Calcium: 9 mg/dL (ref 8.9–10.3)
Chloride: 103 mmol/L (ref 98–111)
Creatinine, Ser: 1.05 mg/dL (ref 0.61–1.24)
GFR calc non Af Amer: >60 mL/min (ref >60)
Glucose, Bld: 101 mg/dL — ABNORMAL HIGH (ref 70–99)
Potassium: 4.3 mmol/L (ref 3.5–5.1)
SODIUM: 138 mmol/L (ref 135–145)

## 2017-12-11 LAB — CBC
HCT: 34.9 % — ABNORMAL LOW (ref 39.0–52.0)
Hemoglobin: 11.4 g/dL — ABNORMAL LOW (ref 13.0–17.0)
MCH: 25.4 pg — ABNORMAL LOW (ref 26.0–34.0)
MCHC: 32.7 g/dL (ref 30.0–36.0)
MCV: 77.9 fL — ABNORMAL LOW (ref 78.0–100.0)
Platelets: 197 10*3/uL (ref 150–400)
RBC: 4.48 MIL/uL (ref 4.22–5.81)
RDW: 13.1 % (ref 11.5–15.5)
WBC: 7.4 10*3/uL (ref 4.0–10.5)

## 2017-12-11 MED ORDER — OXYCODONE HCL 5 MG PO TABS: 5.0000 mg | ORAL_TABLET | ORAL | Status: DC | PRN

## 2017-12-11 MED ORDER — HYDROMORPHONE HCL 1 MG/ML IJ SOLN
1.0000 mg | INTRAMUSCULAR | Status: DC | PRN
  Filled 2017-12-11 (×2): qty 1

## 2017-12-11 MED ORDER — IOPAMIDOL (ISOVUE-300) INJECTION 61%
100.0000 mL | Freq: Once | INTRAVENOUS | Status: AC | PRN
  Administered 2017-12-11: 100 mL via ORAL

## 2017-12-11 MED ORDER — IOPAMIDOL (ISOVUE-300) INJECTION 61%
INTRAVENOUS | Status: AC
  Filled 2017-12-11: qty 50

## 2017-12-11 MED ORDER — ACETAMINOPHEN 500 MG PO TABS
1000.0000 mg | ORAL_TABLET | Freq: Three times a day (TID) | ORAL | Status: DC
  Administered 2017-12-11 – 2017-12-12 (×4): 1000 mg via ORAL
  Filled 2017-12-11 (×6): qty 2

## 2017-12-11 MED ORDER — METHOCARBAMOL 500 MG PO TABS: 750.0000 mg | ORAL_TABLET | Freq: Four times a day (QID) | ORAL | Status: DC | PRN

## 2017-12-11 NOTE — Progress Notes (Signed)
Observed patient eating M&Ms, informed patient that he should not be eating these, he replied, " too late now" and ate some more.  Notified PA

## 2017-12-11 NOTE — Progress Notes (Signed)
Phlebotomist from the lab reports that patient refused  blood  drawn for his morning labs. We will continue to monitor.

## 2017-12-11 NOTE — Progress Notes (Addendum)
[facility] Progress Note  2 Days Post-Op  Subjective: CC:  Denies pain. Hungry and requesting to eat. Ate M&Ms around his NG tube after his UGI. Denies BM, minimal flatus. Mobilizing in hall.  Objective: Vital signs in last 24 hours: Temp:  [98.3 F (36.8 C)-98.9 F (37.2 C)] 98.3 F (36.8 C) (09/20 0512) Pulse Rate:  [51-55] 53 (09/20 0512) Resp:  [14-24] 16 (09/20 0512) BP: (122-141)/(77-84) 141/77 (09/20 0512) SpO2:  [96 %-100 %] 100 % (09/20 0512) Last BM Date: 12/08/17  Intake/Output from previous day: 09/19 0701 - 09/20 0700 In: 2297.7 [I.V.:2019.3; IV Piggyback:278.4] Out: 1200 [Urine:1100; Emesis/NG output:100] Intake/Output this shift: No intake/output data recorded.  PE: Gen:  Alert, NAD, pleasant Card:  Regular rate and rhythm, pedal pulses 2+ BL Pulm:  Normal effort, clear to auscultation bilaterally Abd: Soft, non-tender, non-distended, +BS,  incision C/D/I  NG - 100cc/24h bilious  Skin: warm and dry, no rashes  Psych: A&Ox3   Lab Results:  Recent Labs    12/09/17 0046 12/10/17 0357  WBC 8.8 8.8  HGB 12.8* 11.3*  HCT 39.1 34.6*  PLT 276 200   BMET Recent Labs    12/09/17 0046 12/10/17 0357  NA 135 133*  K 4.1 4.4  CL 104 103  CO2 22 22  GLUCOSE 94 100*  BUN 34* 20  CREATININE 1.26* 0.91  CALCIUM 8.8* 8.4*   PT/INR No results for input(s): LABPROT, INR in the last 72 hours. CMP     Component Value Date/Time   NA 133 (L) 12/10/2017 0357   K 4.4 12/10/2017 0357   CL 103 12/10/2017 0357   CO2 22 12/10/2017 0357   GLUCOSE 100 (H) 12/10/2017 0357   BUN 20 12/10/2017 0357   CREATININE 0.91 12/10/2017 0357   CREATININE 0.84 01/30/2014 1020   CALCIUM 8.4 (L) 12/10/2017 0357   PROT 6.4 (L) 12/09/2017 0046   ALBUMIN 3.6 12/09/2017 0046   AST 19 12/09/2017 0046   ALT 12 12/09/2017 0046   ALKPHOS 62 12/09/2017 0046   BILITOT 0.5 12/09/2017 0046   GFRNONAA >60 12/10/2017 0357   GFRNONAA >89 01/30/2014 1020   GFRAA >60  12/10/2017 0357   GFRAA >89 01/30/2014 1020   Lipase     Component Value Date/Time   LIPASE 22 12/09/2017 0046       Studies/Results: No results found.  Anti-infectives: Anti-infectives (From admission, onward)   Start     Dose/Rate Route Frequency Ordered Stop   12/09/17 1400  piperacillin-tazobactam (ZOSYN) IVPB 3.375 g     3.375 g 12.5 mL/hr over 240 Minutes Intravenous Every 8 hours 12/09/17 0615     12/09/17 0336  piperacillin-tazobactam (ZOSYN) 3.375 GM/50ML IVPB  Status:  Discontinued    Note to Pharmacy:  Enrigue Catena   : cabinet override      12/09/17 0336 12/09/17 0345   12/09/17 0315  piperacillin-tazobactam (ZOSYN) IVPB 3.375 g     3.375 g 100 mL/hr over 30 Minutes Intravenous  Once 12/09/17 0307 12/09/17 0432     Assessment/Plan Perforated pyloric ulcer, POD #2 s/p ex lap with graham patch by Dr. Barry Dienes 12-09-17 - follow results H Pylori - UGI neg for leak, D/C NG start clears - pain control, D/C PCA and start PO pain control with IV for breakthrough - continue pepcid  - mobilize/IS  Tobacco abuse -significant blebs on CT scan 9/17 -pulm toilet and IS, albuterol nebs PRN  FEN -decrease IVF, start clears VTE -SCDs/Lovenox  ID -Zosyn 9/18 >> (  day#2) Foley - DC today 9/19   LOS: 2 days    Obie Dredge, [facility] Pager: [phone]

## 2017-12-12 LAB — BASIC METABOLIC PANEL
ANION GAP: 7 (ref 5–15)
BUN: 16 mg/dL (ref 6–20)
CHLORIDE: 105 mmol/L (ref 98–111)
CO2: 23 mmol/L (ref 22–32)
Calcium: 8.5 mg/dL — ABNORMAL LOW (ref 8.9–10.3)
Creatinine, Ser: 0.97 mg/dL (ref 0.61–1.24)
GFR calc Af Amer: >60 mL/min (ref >60)
GFR calc non Af Amer: >60 mL/min (ref >60)
GLUCOSE: 89 mg/dL (ref 70–99)
Potassium: 4.2 mmol/L (ref 3.5–5.1)
Sodium: 135 mmol/L (ref 135–145)

## 2017-12-12 LAB — CBC
HCT: 32.7 % — ABNORMAL LOW (ref 39.0–52.0)
HEMOGLOBIN: 10.7 g/dL — AB (ref 13.0–17.0)
MCH: 25.3 pg — ABNORMAL LOW (ref 26.0–34.0)
MCHC: 32.7 g/dL (ref 30.0–36.0)
MCV: 77.3 fL — AB (ref 78.0–100.0)
Platelets: 208 10*3/uL (ref 150–400)
RBC: 4.23 MIL/uL (ref 4.22–5.81)
RDW: 12.9 % (ref 11.5–15.5)
WBC: 5.5 10*3/uL (ref 4.0–10.5)

## 2017-12-12 NOTE — Progress Notes (Signed)
Pt informed that he leaves from now on he will leave against medical advice. He was understanding of this.

## 2017-12-12 NOTE — Progress Notes (Signed)
rn spoke with matt martin md about pt being able to leave the floor. Previous md had given pt order he could go downstairs with family. Prior to shift change our staff had walked with pt down stairs to meet family however the family never showed up. Our staff was down with pt for over one hour. Pt wanted to walk to parking deck per rn to rn report. Pt also refused to have iv fluids hooked back up as well. [place] reversed previous order and stated via telephone if he leaves our floor from now on he will have left ama. rn will explain this to pt.

## 2017-12-12 NOTE — Plan of Care (Signed)
Pt stable at time of assessment. No needs at this time. No changes needed to care plans.

## 2017-12-12 NOTE — Plan of Care (Signed)
Pt resting in bed this morning. Unhappy with diet - wants cereal; states he is leaving if he doesn't get any cereal today. Up in room and in hall periodically with no complaints of pain. Will continue to monitor.

## 2017-12-12 NOTE — Progress Notes (Signed)
Patient ID: Marvin Thomas, male   DOB: 01-03-1966, 52 y.o.   MRN: [id number] 3 Days Post-Op   Subjective: No complaints this morning except that he is hungry.  Up walking in the halls.  Denies pain.  Tolerated clear liquids well.  Dates he had a bowel movement yesterday.  Objective: Vital signs in last 24 hours: Temp:  [98.2 F (36.8 C)-99.6 F (37.6 C)] 98.2 F (36.8 C) (09/21 0441) Pulse Rate:  [59-66] 66 (09/21 0441) Resp:  [16-18] 18 (09/21 0441) BP: (119-132)/(73-86) 124/82 (09/21 0441) SpO2:  [99 %-100 %] 100 % (09/21 0441) Last BM Date: 12/08/17  Intake/Output from previous day: 09/20 0701 - 09/21 0700 In: 1847.9 [P.O.:360; I.V.:820.8; IV Piggyback:667.1] Out: 6811 [Urine:1850; Emesis/NG output:1; Stool:1] Intake/Output this shift: No intake/output data recorded.  General appearance: alert, cooperative and no distress GI: normal findings: soft, non-tender and Nondistended Incision/Wound: No erythema or drainage  Lab Results:  Recent Labs    12/11/17 1025 12/12/17 0439  WBC 7.4 5.5  HGB 11.4* 10.7*  HCT 34.9* 32.7*  PLT 197 208   BMET Recent Labs    12/11/17 1025 12/12/17 0439  NA 138 135  K 4.3 4.2  CL 103 105  CO2 26 23  GLUCOSE 101* 89  BUN 18 16  CREATININE 1.05 0.97  CALCIUM 9.0 8.5*     Studies/Results: Dg Ugi W/water Sol Cm  Result Date: 12/11/2017 CLINICAL DATA:  Patient status post surgical repair of perforated gastric ulcer. EXAM: UPPER GI SERIES WITH KUB TECHNIQUE: After obtaining a scout radiograph a routine upper GI series was performed using water-soluble contrast FLUOROSCOPY TIME:  Fluoroscopy Time:  2.6 minutes Radiation Exposure Index (if provided by the fluoroscopic device): 47.5 mGy COMPARISON:  CT 12/09/2017 FINDINGS: Initial KUB demonstrates at the NG tube is above the carina. Midline surgical staples noted. Patient ingested water-soluble oral contrast through a straw. Contrast quickly passed into the stomach. Contrast passed  into the duodenum without evidence of leak. Contrast traveled to the a proximal small bowel. Patient did have difficulty maneuvering postsurgery so relatively small amount of contrast passed through the pylorus but again no evidence of leak. IMPRESSION: No evidence of leak of oral contrast following surgical repair of perforated gastric ulcer. NG tube is in the mid to high esophagus above the carina. These results will be called to the ordering clinician or representative by the Radiologist Assistant, and communication documented in the PACS or zVision Dashboard. Electronically Signed   By: Suzy Bouchard M.D.   On: 12/11/2017 10:01    Anti-infectives: Anti-infectives (From admission, onward)   Start     Dose/Rate Route Frequency Ordered Stop   12/09/17 1400  piperacillin-tazobactam (ZOSYN) IVPB 3.375 g     3.375 g 12.5 mL/hr over 240 Minutes Intravenous Every 8 hours 12/09/17 0615     12/09/17 0336  piperacillin-tazobactam (ZOSYN) 3.375 GM/50ML IVPB  Status:  Discontinued    Note to Pharmacy:  Enrigue Catena   : cabinet override      12/09/17 0336 12/09/17 0345   12/09/17 0315  piperacillin-tazobactam (ZOSYN) IVPB 3.375 g     3.375 g 100 mL/hr over 30 Minutes Intravenous  Once 12/09/17 0307 12/09/17 0432      Assessment/Plan: s/p Procedure(s): EXPLORATORY LAPAROTOMY, REPAIR PERFORATED PYLORIC ULCER Doing well without apparent complication.  Diet advanced to full liquids.   LOS: 3 days    Edward Jolly 12/12/2017

## 2017-12-13 LAB — CBC
HCT: 34.1 % — ABNORMAL LOW (ref 39.0–52.0)
HEMOGLOBIN: 11 g/dL — AB (ref 13.0–17.0)
MCH: 25.1 pg — ABNORMAL LOW (ref 26.0–34.0)
MCHC: 32.3 g/dL (ref 30.0–36.0)
MCV: 77.9 fL — AB (ref 78.0–100.0)
PLATELETS: 233 10*3/uL (ref 150–400)
RBC: 4.38 MIL/uL (ref 4.22–5.81)
RDW: 12.8 % (ref 11.5–15.5)
WBC: 4.5 10*3/uL (ref 4.0–10.5)

## 2017-12-13 LAB — BASIC METABOLIC PANEL
Anion gap: 8 (ref 5–15)
BUN: 16 mg/dL (ref 6–20)
CALCIUM: 8.8 mg/dL — AB (ref 8.9–10.3)
CO2: 26 mmol/L (ref 22–32)
CREATININE: 1.04 mg/dL (ref 0.61–1.24)
Chloride: 104 mmol/L (ref 98–111)
GFR calc Af Amer: >60 mL/min (ref >60)
GLUCOSE: 92 mg/dL (ref 70–99)
Potassium: 4.1 mmol/L (ref 3.5–5.1)
Sodium: 138 mmol/L (ref 135–145)

## 2017-12-13 MED ORDER — PANTOPRAZOLE SODIUM 20 MG PO TBEC: 40.0000 mg | DELAYED_RELEASE_TABLET | Freq: Every day | ORAL | 0 refills | Status: DC

## 2017-12-13 NOTE — Progress Notes (Signed)
Patient ID: Marvin Thomas, male   DOB: 09/10/1965, 52 y.o.   MRN: [id number] 4 Days Post-Op   Subjective: No complaints.  Denies pain or nausea.  Wants to go home.  Objective: Vital signs in last 24 hours: Temp:  [98 F (36.7 C)-98.6 F (37 C)] 98.6 F (37 C) (09/22 0702) Pulse Rate:  [56-72] 56 (09/22 0702) Resp:  [18-19] 18 (09/22 0702) BP: (118-139)/(74-90) 118/75 (09/22 0702) SpO2:  [99 %-100 %] 99 % (09/22 0702) Last BM Date: 12/11/17  Intake/Output from previous day: 09/21 0701 - 09/22 0700 In: 2942.2 [P.O.:2520; I.V.:147; IV Piggyback:275.2] Out: 1750 [Urine:1750] Intake/Output this shift: No intake/output data recorded.  General appearance: alert, cooperative and no distress GI: normal findings: soft, non-tender Incision/Wound: No erythema or drainage  Lab Results:  Recent Labs    12/12/17 0439 12/13/17 0442  WBC 5.5 4.5  HGB 10.7* 11.0*  HCT 32.7* 34.1*  PLT 208 233   BMET Recent Labs    12/12/17 0439 12/13/17 0442  NA 135 138  K 4.2 4.1  CL 105 104  CO2 23 26  GLUCOSE 89 92  BUN 16 16  CREATININE 0.97 1.04  CALCIUM 8.5* 8.8*     Studies/Results: Dg Ugi W/water Sol Cm  Result Date: 12/11/2017 CLINICAL DATA:  Patient status post surgical repair of perforated gastric ulcer. EXAM: UPPER GI SERIES WITH KUB TECHNIQUE: After obtaining a scout radiograph a routine upper GI series was performed using water-soluble contrast FLUOROSCOPY TIME:  Fluoroscopy Time:  2.6 minutes Radiation Exposure Index (if provided by the fluoroscopic device): 47.5 mGy COMPARISON:  CT 12/09/2017 FINDINGS: Initial KUB demonstrates at the NG tube is above the carina. Midline surgical staples noted. Patient ingested water-soluble oral contrast through a straw. Contrast quickly passed into the stomach. Contrast passed into the duodenum without evidence of leak. Contrast traveled to the a proximal small bowel. Patient did have difficulty maneuvering postsurgery so relatively small  amount of contrast passed through the pylorus but again no evidence of leak. IMPRESSION: No evidence of leak of oral contrast following surgical repair of perforated gastric ulcer. NG tube is in the mid to high esophagus above the carina. These results will be called to the ordering clinician or representative by the Radiologist Assistant, and communication documented in the PACS or zVision Dashboard. Electronically Signed   By: Suzy Bouchard M.D.   On: 12/11/2017 10:01    Anti-infectives: Anti-infectives (From admission, onward)   Start     Dose/Rate Route Frequency Ordered Stop   12/09/17 1400  piperacillin-tazobactam (ZOSYN) IVPB 3.375 g     3.375 g 12.5 mL/hr over 240 Minutes Intravenous Every 8 hours 12/09/17 0615     12/09/17 0336  piperacillin-tazobactam (ZOSYN) 3.375 GM/50ML IVPB  Status:  Discontinued    Note to Pharmacy:  Enrigue Catena   : cabinet override      12/09/17 0336 12/09/17 0345   12/09/17 0315  piperacillin-tazobactam (ZOSYN) IVPB 3.375 g     3.375 g 100 mL/hr over 30 Minutes Intravenous  Once 12/09/17 0307 12/09/17 0432      Assessment/Plan: s/p Procedure(s): EXPLORATORY LAPAROTOMY, REPAIR PERFORATED PYLEORIC ULCER Doing well without complication.  Discharge home today.   LOS: 4 days    Edward Jolly 12/13/2017

## 2017-12-13 NOTE — Progress Notes (Addendum)
Pt admits to eating regular food brought in by family. He states it did not bother his stomach and denied pain or nausea at this time. Pt room also continues to smell of cigarette smoke though nursing staff has no evidence of pt smoking.

## 2017-12-13 NOTE — Progress Notes (Signed)
Pt refusing am zosyn stating  He did not want to be hooked back up to the his iv at this time.

## 2017-12-13 NOTE — Plan of Care (Signed)
Reviewed discharge instructions and copy given to patient. IV removed. Dressings removed from mid-abdominal incision. Pt ready for discharge.

## 2017-12-13 NOTE — Discharge Summary (Signed)
  Patient ID: Marvin Thomas [id number] 52 y.o. 07-24-1965  12/08/2017  Discharge date and time: 12/13/2017   Admitting Physician: Stark Klein  Discharge Physician: Edward Jolly  Admission Diagnoses: Perforated viscus [R19.8]  Discharge Diagnoses: Perforated pyloric channel ulcer  Operations: Procedure(s): EXPLORATORY LAPAROTOMY, REPAIR PERFORATED PYLEORIC ULCER  Admission Condition: poor  Discharged Condition: good  Indication for Admission: Pt is a 52 yo M who presents with 4 days of severe abdominal pain that has acutely worsened today.  He has also had nausea.  He has been diagnosed with ulcers clinically in the recent past and has been "prescribed medicine, but never picked it up."  He has not previously had endoscopy.  He denies weight loss other than the last few days as he has been unable to eat anything.  The pain feels worse when lying flat or moving.  He tried tylenol and ibuprofen for the pain but it has not helped.  He also feels pain in his left shoulder and across his upper abdomen to the flanks.  He feels shortness of breath when lying flat.  He had a bloody bowel movement today.  He has a known history of gallstones.  He denies significant alcohol use, but has smoked for 36 years.    He presented to the emergency room where CT scan showed free intraperitoneal air.  He was admitted for emergency laparotomy   Hospital Course: Patient underwent emergency exploratory laparotomy by Dr. Barry Dienes and Phillip Heal patch repair of a perforated prepyloric ulcer.  His postoperative course was unremarkable.  A Gastrografin swallow was obtained on postoperative day 2 showing no leak or obstruction.  NG tube was removed and he was started on a clear liquid diet.  Advance to a full liquid diet the following day.  He was actually somewhat noncompliant eating solid food even around his NG tube.  He denied any pain or GI complaints postoperatively.  On the day of discharge she is  tolerating a full liquid diet well.  Ambulatory.  Abdomen is soft and nontender.  Incision clean and dry.   Disposition: Home  Patient Instructions:  Allergies as of 12/13/2017      Reactions   Aspirin Nausea Only      Medication List    STOP taking these medications   sucralfate 1 g tablet Commonly known as:  CARAFATE     TAKE these medications   acetaminophen 500 MG tablet Commonly known as:  TYLENOL Take 1,000 mg by mouth every 6 (six) hours as needed for mild pain.   pantoprazole 20 MG tablet Commonly known as:  PROTONIX Take 2 tablets (40 mg total) by mouth daily. What changed:  how much to take       Activity: no heavy lifting for 3 weeks Diet: regular diet Wound Care: none needed  Follow-up:  With [facility] surgery clinic in 1 week.  Signed: Edward Jolly MD, FACS  12/13/2017, 8:07 AM

## 2017-12-13 NOTE — Discharge Instructions (Signed)
[facility]      [facility] [phone]  OPEN ABDOMINAL SURGERY: POST OP INSTRUCTIONS  Always review your discharge instruction sheet given to you by the facility where your surgery was performed.  IF YOU HAVE DISABILITY OR FAMILY LEAVE FORMS, YOU MUST BRING THEM TO THE OFFICE FOR PROCESSING.  PLEASE DO NOT GIVE THEM TO YOUR DOCTOR.  1. A prescription for pain medication may be given to you upon discharge.  Take your pain medication as prescribed, if needed.  If narcotic pain medicine is not needed, then you may take acetaminophen (Tylenol) or ibuprofen (Advil) as needed. 2. Take your usually prescribed medications unless otherwise directed. 3. If you need a refill on your pain medication, please contact your pharmacy. They will contact our office to request authorization.  Prescriptions will not be filled after 5pm or on week-ends. 4. You should follow a light diet the first few days after arrival home, such as soup and crackers, pudding, etc.unless your doctor has advised otherwise. A high-fiber, low fat diet can be resumed as tolerated.   Be sure to include lots of fluids daily. Most patients will experience some swelling and bruising on the chest and neck area.  Ice packs will help.  Swelling and bruising can take several days to resolve 5. Most patients will experience some swelling and bruising in the area of the incision. Ice pack will help. Swelling and bruising can take several days to resolve..  6. It is common to experience some constipation if taking pain medication after surgery.  Increasing fluid intake and taking a stool softener will usually help or prevent this problem from occurring.  A mild laxative (Milk of Magnesia or Miralax) should be taken according to package directions if there are no bowel movements after 48 hours. 7.  You may have steri-strips (small skin tapes) in place directly over the incision.  These strips should be left on the skin for 7-10 days.  If your  surgeon used skin glue on the incision, you may shower in 24 hours.  The glue will flake off over the next 2-3 weeks.  Any sutures or staples will be removed at the office during your follow-up visit. You may find that a light gauze bandage over your incision may keep your staples from being rubbed or pulled. You may shower and replace the bandage daily. 8. ACTIVITIES:  You may resume regular (light) daily activities beginning the next day--such as daily self-care, walking, climbing stairs--gradually increasing activities as tolerated.  You may have sexual intercourse when it is comfortable.  Refrain from any heavy lifting or straining until approved by your doctor. a. You may drive when you no longer are taking prescription pain medication, you can comfortably wear a seatbelt, and you can safely maneuver your car and apply brakes b. Return to Work: ___________________________________ 9. You should see your doctor in the office for a follow-up appointment approximately two weeks after your surgery.  Make sure that you call for this appointment within a day or two after you arrive home to insure a convenient appointment time. OTHER INSTRUCTIONS:  _____________________________________________________________ _____________________________________________________________  WHEN TO CALL YOUR DOCTOR: 1. Fever over 101.0 2. Inability to urinate 3. Nausea and/or vomiting 4. Extreme swelling or bruising 5. Continued bleeding from incision. 6. Increased pain, redness, or drainage from the incision. 7. Difficulty swallowing or breathing 8. Muscle cramping or spasms. 9. Numbness or tingling in hands or feet or around lips.  The clinic staff is available to   answer your questions during regular business hours.  Please don't hesitate to call and ask to speak to one of the nurses if you have concerns.  For further questions, please visit [email or web address]   

## 2019-10-03 IMAGING — CT CT ANGIO CHEST-ABD-PELV FOR DISSECTION W/ AND WO/W CM
2 of 7 series · 12 of 46 positions shown, 14 images · IV contrast (ISOVUE)
Comparison: CT chest 11/16/2008 and CT abdomen 11/08/2012

CLINICAL DATA: Nausea and vomiting. Patient believes he as peptic
ulcer disease. Chest, abdominal and back pain.

EXAM:
CT ANGIOGRAPHY CHEST, ABDOMEN AND PELVIS
TECHNIQUE: Multidetector CT imaging through the chest, abdomen and pelvis was
performed using the standard protocol during bolus administration of
intravenous contrast. Multiplanar reconstructed images and MIPs were
obtained and reviewed to evaluate the vascular anatomy.
CONTRAST:  100mL EEWZYW-KDG IOPAMIDOL (EEWZYW-KDG) INJECTION 76%

[Series 5: axial arterial · axial · arterial · 0.86mm/px · z∈[+931,+1474]mm · 9 of 227 slices shown, 11 images]
[im 23/227  soft-tissue]
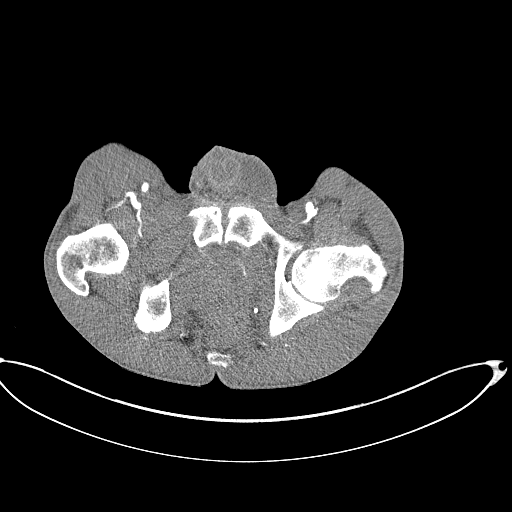
[im 23/227  bone]
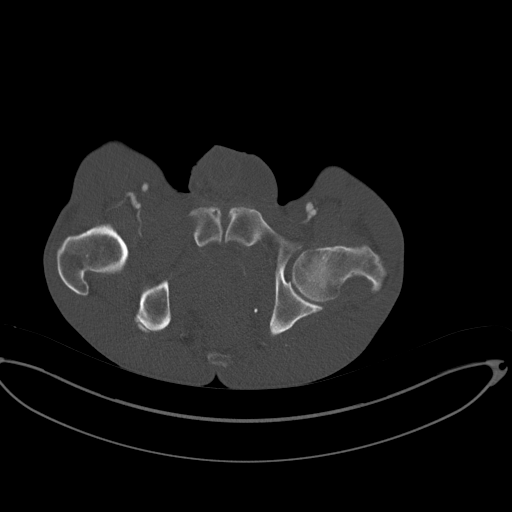
[im 46/227  soft-tissue]
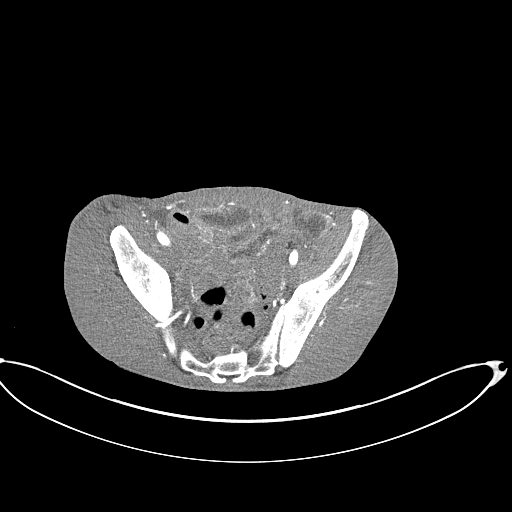
[im 68/227  soft-tissue]
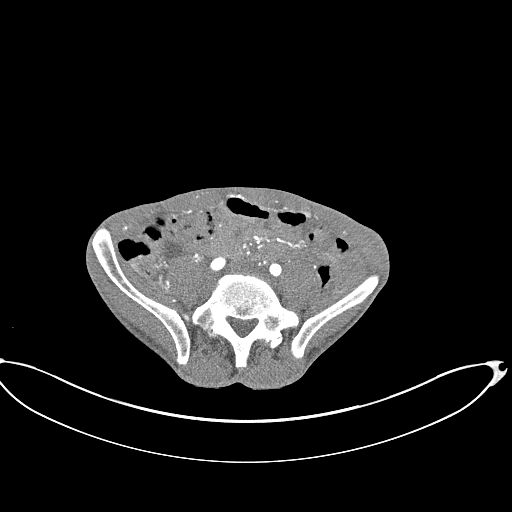
[im 91/227  soft-tissue]
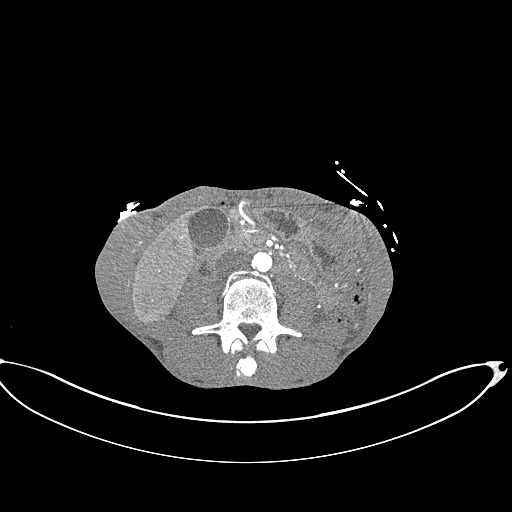
[im 114/227  soft-tissue]
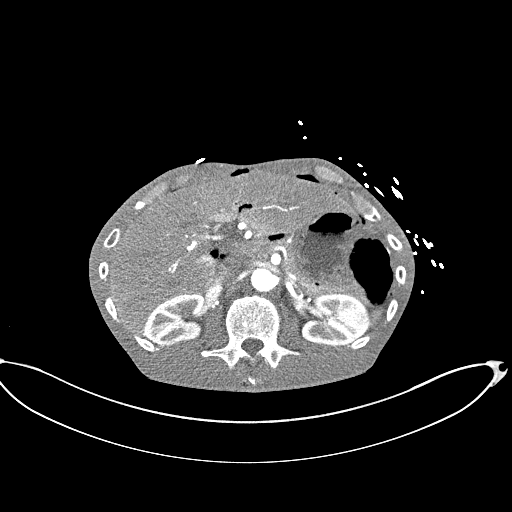
[im 136/227  soft-tissue]
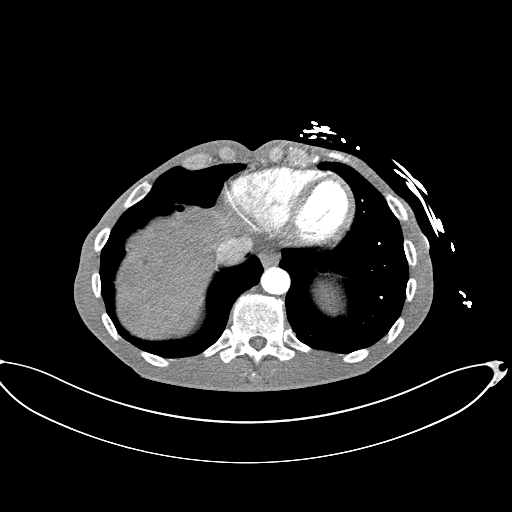
[im 159/227  soft-tissue]
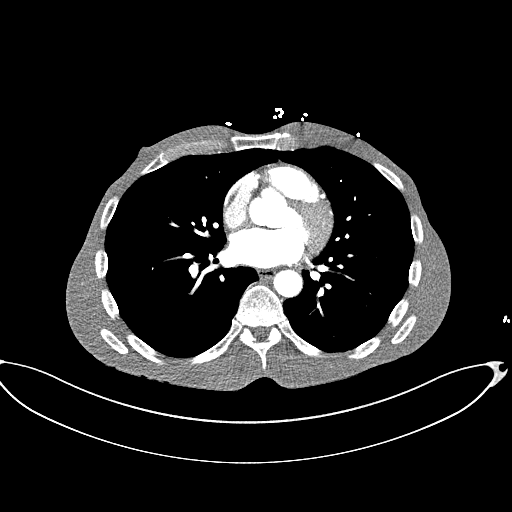
[im 181/227  soft-tissue]
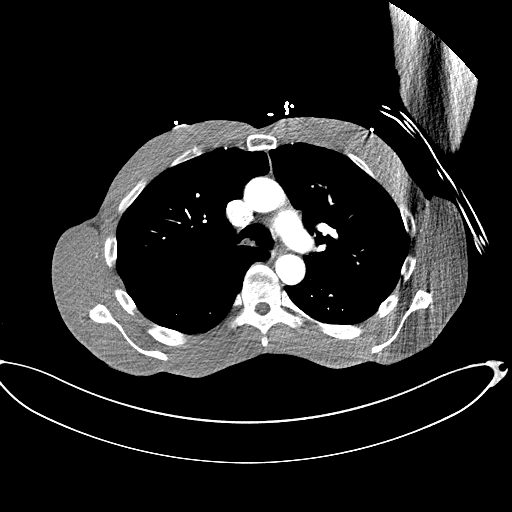
[im 204/227  soft-tissue]
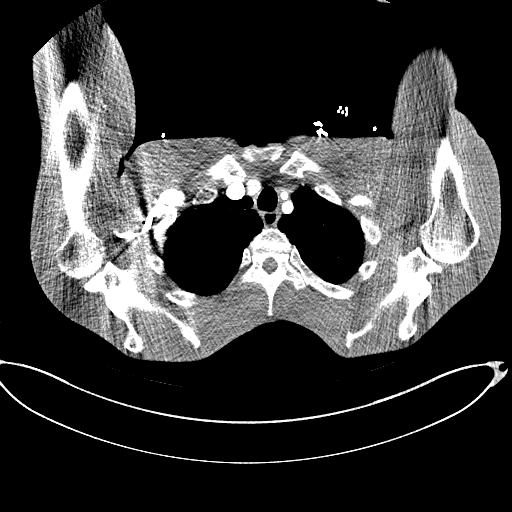
[im 204/227  bone]
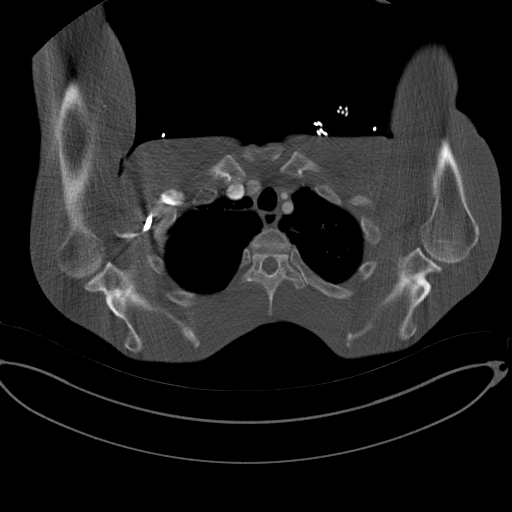

[Series 9: coronals · coronal · 0.75mm/px · 3 of 162 slices shown]
[im 41/162  soft-tissue]
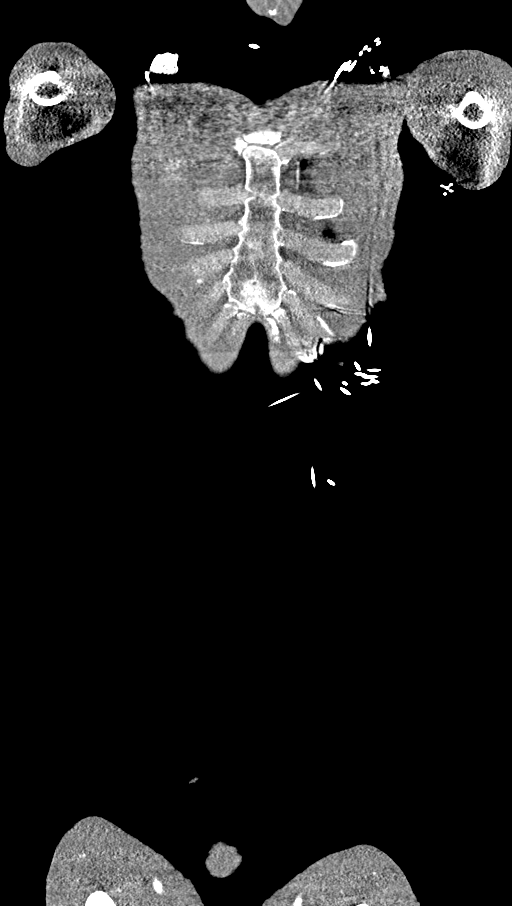
[im 81/162  soft-tissue]
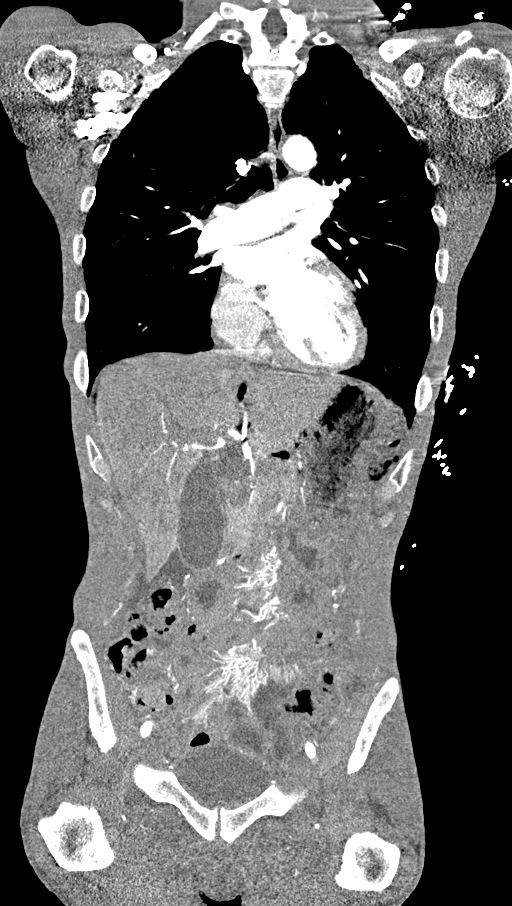
[im 121/162  soft-tissue]
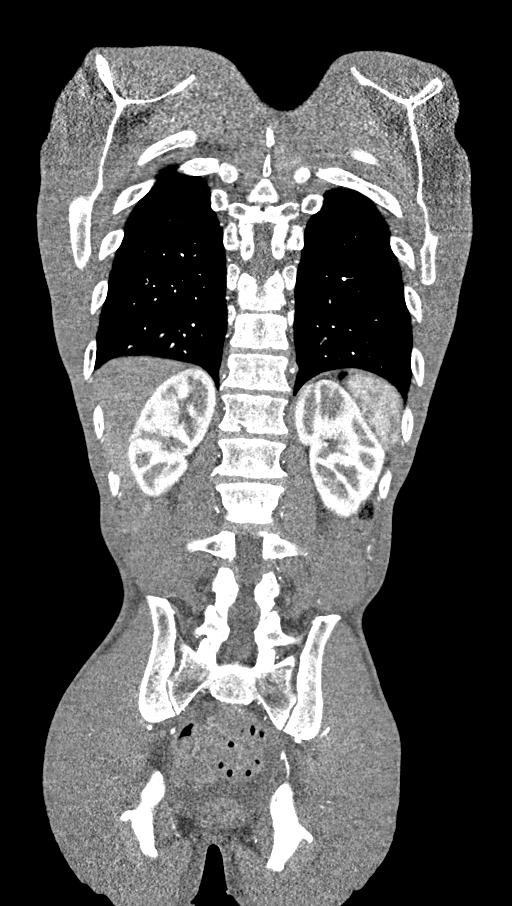

[12 of 46 positions shown; findings below may reference images not displayed]

FINDINGS: CTA CHEST FINDINGS

Cardiovascular: Heart size is normal. No pericardial effusion,
aortic aneurysm or dissection. No pulmonary embolus. Great vessels
are conventional and branch pattern without stenosis or dissection.

Mediastinum/Nodes: No enlarged mediastinal, hilar, or axillary lymph
nodes. Thyroid gland, trachea, and esophagus demonstrate no
significant findings.

Lungs/Pleura: Paraseptal and bullous emphysematous disease of the
lungs, upper lobe predominant and right greater than left. Large
right upper lobe bulla is identified measuring 10.7 x 7.7 x 12.7 cm.
No pulmonary consolidation or dominant mass. No pneumothorax or
effusion.

Musculoskeletal: Degenerative changes are noted along the thoracic
spine without aggressive osseous lesions.

Review of the MIP images confirms the above findings.

CTA ABDOMEN AND PELVIS FINDINGS

VASCULAR

Aorta: Nonaneurysmal.  No dissection.

Celiac: Patent without significant stenosis, aneurysm or dissection.

SMA: Patent

Renals: Patent bilateral renal arteries without significant
stenosis, aneurysm or evidence of fibromuscular dysplasia.

IMA: Patent

Inflow: Patent without evidence of aneurysm, dissection, vasculitis
or significant stenosis.

Veins: No obvious venous abnormality within the limitations of this
arterial phase study.

Review of the MIP images confirms the above findings.

NON-VASCULAR

Hepatobiliary: Physiologic distention of the gallbladder without
stones. No space-occupying mass of the liver.

Pancreas: The pancreatic duct is visualized but is normal caliber.
No mass or definite inflammation though assessment for inflammation
is limited due to lack of intra-abdominal fat.

Spleen: No splenomegaly.

Adrenals/Urinary Tract: Adrenal mass. Cortical symmetric enhancement
of both kidneys without nephrolithiasis, renal mass nor obstructive
uropathy. The urinary bladder is physiologically distended.

Stomach/Bowel: Free intraperitoneal air is noted within the upper
abdomen with speckled air like lucencies overlying the stomach and
extending along the portal vein and common hepatic artery. Given
history of prior ulcers, suspect recurrence of upper
gastrointestinal tract ulcer, etiology either from stomach or
duodenum. No bowel obstruction is identified. Tubular air-filled
structure in the right hemipelvis is believed to represent a normal
appendix.

Lymphatic: No definite lymphadenopathy though assessment is somewhat
limited as previously mentioned due to paucity of intra-abdominal
and pelvic fat.

Reproductive: Unremarkable

Other: Paucity of intra-abdominal and pelvic fat. Cachexia. There is
a small amount of free fluid in the pelvis.

Musculoskeletal: No acute nor suspicious osseous abnormalities.

Review of the MIP images confirms the above findings.
IMPRESSION: Free intraperitoneal air, predominantly in the upper abdomen
consistent with perforated hollow viscus and potentially
representing recurrence of gastrointestinal ulcer. Suspect gastric
or duodenal etiology given preponderance of free air in the upper
abdomen. Small amount of free fluid is noted in the pelvis. These
results were called by telephone at the time of interpretation on
12/09/2017 at [DATE] to PA RTOYOTA JOSHJAX , who verbally acknowledged
these results.

No aortic aneurysm or dissection. No acute pulmonary embolus. Patent
branch vessels off the abdominal aorta.

Cachexia limiting assessment.

Paraseptal and bullous emphysema of the lungs.

## 2020-11-11 ENCOUNTER — Other Ambulatory Visit: Payer: Self-pay

## 2020-11-11 ENCOUNTER — Emergency Department (HOSPITAL_COMMUNITY): Payer: Self-pay

## 2020-11-11 ENCOUNTER — Encounter (HOSPITAL_COMMUNITY): Payer: Self-pay | Admitting: Oncology

## 2020-11-11 ENCOUNTER — Inpatient Hospital Stay (HOSPITAL_COMMUNITY)
Admission: EM | Admit: 2020-11-11 | Discharge: 2020-11-13 | DRG: 388 | Discharge status: Against Medical Advice | Payer: Self-pay | Attending: Internal Medicine | Admitting: Internal Medicine

## 2020-11-11 ENCOUNTER — Observation Stay (HOSPITAL_COMMUNITY): Payer: Self-pay

## 2020-11-11 DIAGNOSIS — R229 Localized swelling, mass and lump, unspecified: Secondary | ICD-10-CM

## 2020-11-11 DIAGNOSIS — K279 Peptic ulcer, site unspecified, unspecified as acute or chronic, without hemorrhage or perforation: Secondary | ICD-10-CM

## 2020-11-11 DIAGNOSIS — D1779 Benign lipomatous neoplasm of other sites: Secondary | ICD-10-CM | POA: Diagnosis present

## 2020-11-11 DIAGNOSIS — Z23 Encounter for immunization: Secondary | ICD-10-CM

## 2020-11-11 DIAGNOSIS — D638 Anemia in other chronic diseases classified elsewhere: Secondary | ICD-10-CM | POA: Diagnosis present

## 2020-11-11 DIAGNOSIS — F1290 Cannabis use, unspecified, uncomplicated: Secondary | ICD-10-CM

## 2020-11-11 DIAGNOSIS — Z886 Allergy status to analgesic agent status: Secondary | ICD-10-CM

## 2020-11-11 DIAGNOSIS — Z79899 Other long term (current) drug therapy: Secondary | ICD-10-CM

## 2020-11-11 DIAGNOSIS — F17210 Nicotine dependence, cigarettes, uncomplicated: Secondary | ICD-10-CM | POA: Diagnosis present

## 2020-11-11 DIAGNOSIS — R64 Cachexia: Secondary | ICD-10-CM | POA: Diagnosis present

## 2020-11-11 DIAGNOSIS — R109 Unspecified abdominal pain: Secondary | ICD-10-CM | POA: Diagnosis present

## 2020-11-11 DIAGNOSIS — F17200 Nicotine dependence, unspecified, uncomplicated: Secondary | ICD-10-CM | POA: Diagnosis present

## 2020-11-11 DIAGNOSIS — Z8711 Personal history of peptic ulcer disease: Secondary | ICD-10-CM

## 2020-11-11 DIAGNOSIS — Z7151 Drug abuse counseling and surveillance of drug abuser: Secondary | ICD-10-CM

## 2020-11-11 DIAGNOSIS — D649 Anemia, unspecified: Secondary | ICD-10-CM | POA: Diagnosis present

## 2020-11-11 DIAGNOSIS — E162 Hypoglycemia, unspecified: Secondary | ICD-10-CM | POA: Diagnosis not present

## 2020-11-11 DIAGNOSIS — R1011 Right upper quadrant pain: Secondary | ICD-10-CM

## 2020-11-11 DIAGNOSIS — Z6822 Body mass index (BMI) 22.0-22.9, adult: Secondary | ICD-10-CM

## 2020-11-11 DIAGNOSIS — Z716 Tobacco abuse counseling: Secondary | ICD-10-CM

## 2020-11-11 DIAGNOSIS — K567 Ileus, unspecified: Principal | ICD-10-CM | POA: Diagnosis present

## 2020-11-11 DIAGNOSIS — U071 COVID-19: Secondary | ICD-10-CM | POA: Diagnosis present

## 2020-11-11 DIAGNOSIS — K828 Other specified diseases of gallbladder: Secondary | ICD-10-CM | POA: Diagnosis present

## 2020-11-11 LAB — URINALYSIS, ROUTINE W REFLEX MICROSCOPIC
Bilirubin Urine: NEGATIVE
Glucose, UA: NEGATIVE mg/dL
Hgb urine dipstick: NEGATIVE
Ketones, ur: 20 mg/dL — AB
Leukocytes,Ua: NEGATIVE
Nitrite: NEGATIVE
Protein, ur: NEGATIVE mg/dL
Specific Gravity, Urine: 1.026 (ref 1.005–1.030)
pH: 5 (ref 5.0–8.0)

## 2020-11-11 LAB — COMPREHENSIVE METABOLIC PANEL
ALT: 13 U/L (ref 0–44)
AST: 17 U/L (ref 15–41)
Albumin: 3.9 g/dL (ref 3.5–5.0)
Alkaline Phosphatase: 80 U/L (ref 38–126)
Anion gap: 7 (ref 5–15)
BUN: 16 mg/dL (ref 6–20)
CO2: 24 mmol/L (ref 22–32)
Calcium: 9 mg/dL (ref 8.9–10.3)
Chloride: 105 mmol/L (ref 98–111)
Creatinine, Ser: 0.88 mg/dL (ref 0.61–1.24)
GFR, Estimated: >60 mL/min (ref >60)
Glucose, Bld: 84 mg/dL (ref 70–99)
Potassium: 3.9 mmol/L (ref 3.5–5.1)
Sodium: 136 mmol/L (ref 135–145)
Total Bilirubin: 0.8 mg/dL (ref 0.3–1.2)
Total Protein: 6.8 g/dL (ref 6.5–8.1)

## 2020-11-11 LAB — CBC WITH DIFFERENTIAL/PLATELET
Abs Immature Granulocytes: 0.02 10*3/uL (ref 0.00–0.07)
Basophils Absolute: 0 10*3/uL (ref 0.0–0.1)
Basophils Relative: 1 %
Eosinophils Absolute: 0.1 10*3/uL (ref 0.0–0.5)
Eosinophils Relative: 1 %
HCT: 40 % (ref 39.0–52.0)
Hemoglobin: 12.4 g/dL — ABNORMAL LOW (ref 13.0–17.0)
Immature Granulocytes: 0 %
Lymphocytes Relative: 19 %
Lymphs Abs: 1.4 10*3/uL (ref 0.7–4.0)
MCH: 25.5 pg — ABNORMAL LOW (ref 26.0–34.0)
MCHC: 31 g/dL (ref 30.0–36.0)
MCV: 82.1 fL (ref 80.0–100.0)
Monocytes Absolute: 0.4 10*3/uL (ref 0.1–1.0)
Monocytes Relative: 5 %
Neutro Abs: 5.5 10*3/uL (ref 1.7–7.7)
Neutrophils Relative %: 74 %
Platelets: 265 10*3/uL (ref 150–400)
RBC: 4.87 MIL/uL (ref 4.22–5.81)
RDW: 13.4 % (ref 11.5–15.5)
WBC: 7.4 10*3/uL (ref 4.0–10.5)
nRBC: 0 % (ref 0.0–0.2)

## 2020-11-11 LAB — LIPASE, BLOOD: Lipase: 27 U/L (ref 11–51)

## 2020-11-11 MED ORDER — ACETAMINOPHEN 650 MG RE SUPP: 650.0000 mg | Freq: Four times a day (QID) | RECTAL | Status: DC | PRN

## 2020-11-11 MED ORDER — PNEUMOCOCCAL VAC POLYVALENT 25 MCG/0.5ML IJ INJ
0.5000 mL | INJECTION | INTRAMUSCULAR | Status: AC
  Administered 2020-11-12: 0.5 mL via INTRAMUSCULAR
  Filled 2020-11-11: qty 0.5

## 2020-11-11 MED ORDER — IOHEXOL 350 MG/ML SOLN
80.0000 mL | Freq: Once | INTRAVENOUS | Status: AC | PRN
  Administered 2020-11-11: 80 mL via INTRAVENOUS

## 2020-11-11 MED ORDER — ACETAMINOPHEN 325 MG PO TABS: 650.0000 mg | ORAL_TABLET | Freq: Four times a day (QID) | ORAL | Status: DC | PRN

## 2020-11-11 MED ORDER — FENTANYL CITRATE (PF) 100 MCG/2ML IJ SOLN
25.0000 ug | Freq: Once | INTRAMUSCULAR | Status: AC
  Administered 2020-11-11 – 2020-11-12 (×2): 25 ug via INTRAVENOUS
  Filled 2020-11-11: qty 2

## 2020-11-11 MED ORDER — MORPHINE SULFATE (PF) 4 MG/ML IV SOLN: 4.0000 mg | Freq: Once | INTRAVENOUS | Status: DC

## 2020-11-11 MED ORDER — ONDANSETRON HCL 4 MG/2ML IJ SOLN: 4.0000 mg | Freq: Four times a day (QID) | INTRAMUSCULAR | Status: DC | PRN

## 2020-11-11 MED ORDER — SODIUM CHLORIDE 0.9 % IV BOLUS
1000.0000 mL | Freq: Once | INTRAVENOUS | Status: AC
  Administered 2020-11-11: 1000 mL via INTRAVENOUS

## 2020-11-11 MED ORDER — OXYCODONE-ACETAMINOPHEN 5-325 MG PO TABS
1.0000 | ORAL_TABLET | Freq: Once | ORAL | Status: AC
  Administered 2020-11-11: 1 via ORAL
  Filled 2020-11-11: qty 1

## 2020-11-11 MED ORDER — SODIUM CHLORIDE 0.9 % IV SOLN: INTRAVENOUS | Status: DC

## 2020-11-11 MED ORDER — PANTOPRAZOLE SODIUM 40 MG IV SOLR
40.0000 mg | INTRAVENOUS | Status: DC
  Administered 2020-11-11: 40 mg via INTRAVENOUS
  Filled 2020-11-11: qty 40

## 2020-11-11 MED ORDER — MORPHINE SULFATE (PF) 2 MG/ML IV SOLN
1.0000 mg | INTRAVENOUS | Status: DC | PRN
  Administered 2020-11-11 – 2020-11-12 (×6): 1 mg via INTRAVENOUS
  Filled 2020-11-11 (×6): qty 1

## 2020-11-11 MED ORDER — MORPHINE SULFATE (PF) 4 MG/ML IV SOLN
4.0000 mg | Freq: Once | INTRAVENOUS | Status: AC
  Administered 2020-11-11: 4 mg via INTRAVENOUS
  Filled 2020-11-11: qty 1

## 2020-11-11 MED ORDER — ORAL CARE MOUTH RINSE
15.0000 mL | Freq: Two times a day (BID) | OROMUCOSAL | Status: DC
  Administered 2020-11-12 (×2): 15 mL via OROMUCOSAL

## 2020-11-11 MED ORDER — NICOTINE 21 MG/24HR TD PT24
21.0000 mg | MEDICATED_PATCH | Freq: Every day | TRANSDERMAL | Status: DC
  Filled 2020-11-11: qty 1

## 2020-11-11 NOTE — H&P (Signed)
History and Physical    Marvin Thomas DOB: 1965-04-27 DOA: 11/11/2020  PCP: Patient, No Pcp Per (Inactive) Patient coming from: Home  Chief Complaint: Nausea, abdominal pain  HPI: Marvin Thomas is a 55 y.o. male with medical history significant of emergency exploratory laparotomy and Phillip Heal patch repair of a perforated prepyloric ulcer in September 2019, anemia, gallstones, hemothorax, marijuana use, tobacco use presented to the ED complaining of nausea and right upper quadrant abdominal pain.  In the ED, afebrile.  Labs showing WBC 7.4, hemoglobin 12.4 (stable), platelet count 265k.  Sodium 136, potassium 3.9, chloride 105, bicarb 24, BUN 16, creatinine 0.8, glucose 84.  Lipase and LFTs normal.  UA without signs of infection.  Screening COVID test pending.  CT abdomen pelvis showing mildly dilated loops of small bowel primarily within the left upper quadrant without definite transition zone; findings favor ileus over bowel obstruction.  Right upper quadrant ultrasound showing distended gallbladder with slight wall irregularity but no thickening, small dependent echogenic foci may reflect stones or small polyps.  Also CBD normal in caliber, liver unremarkable. Patient was given morphine, Percocet, and 1 L normal saline bolus.  Patient reports 1 week history of right upper quadrant abdominal pain and nausea.  States he has been sticking his finger in his throat to see if he can vomit but has not vomited.  He is not eating much.  Denies diarrhea.  Denies cough, shortness of breath, or chest pain.  He is vaccinated against COVID.  He smokes marijuana and 1.5 packs of cigarettes daily.  Review of Systems:  All systems reviewed and apart from history of presenting illness, are negative.  Past Medical History:  Diagnosis Date   Anemia Dx 2014   Gallstones    Hemothorax    Ulcer     Past Surgical History:  Procedure Laterality Date   LAPAROTOMY N/A 12/09/2017   Procedure:  EXPLORATORY LAPAROTOMY, REPAIR PERFORATED PYLEORIC ULCER;  Surgeon: Stark Klein, MD;  Location: [facility];  Service: General;  Laterality: N/A;     reports that he has been smoking cigarettes. He has a 46.50 pack-year smoking history. He has never used smokeless tobacco. He reports current alcohol use. He reports current drug use. Drug: Marijuana.  Allergies  Allergen Reactions   Aspirin Nausea Only    Family History  Problem Relation Age of Onset   Diabetes Mother    Hypertension Mother    Heart disease Mother        x 3    Heart disease Brother    Hypertension Brother    Diabetes Other    Hypertension Brother    Hypertension Brother    Cancer Neg Hx     Prior to Admission medications   Medication Sig Start Date End Date Taking? Authorizing Provider  pantoprazole (PROTONIX) 20 MG tablet Take 2 tablets (40 mg total) by mouth daily. Patient not taking: Reported on 11/11/2020 12/13/17   Excell Seltzer, MD    Physical Exam: Vitals:   11/11/20 1224 11/11/20 1622 11/11/20 1808 11/11/20 1900  BP:  (!) 158/101 (!) 162/83 (!) 166/79  Pulse:  61 (!) 54 (!) 54  Resp:  '19 17 17  '$ Temp:      TempSrc:      SpO2:  100% 99% 100%  Weight: 68 kg     Height: '5\' 9"'$  (1.753 m)       Physical Exam Constitutional:      General: He is not in acute distress. HENT:  Head: Normocephalic and atraumatic.  Eyes:     Extraocular Movements: Extraocular movements intact.     Conjunctiva/sclera: Conjunctivae normal.  Cardiovascular:     Rate and Rhythm: Normal rate and regular rhythm.     Pulses: Normal pulses.  Pulmonary:     Effort: Pulmonary effort is normal. No respiratory distress.     Breath sounds: Normal breath sounds. No wheezing or rales.  Abdominal:     General: Bowel sounds are normal. There is no distension.     Palpations: Abdomen is soft.     Tenderness: There is no guarding or rebound.     Comments: Mild epigastric tenderness  Musculoskeletal:        General: No  swelling or tenderness.     Cervical back: Normal range of motion and neck supple.  Skin:    General: Skin is warm and dry.     Comments: Mobile, nontender subcutaneous skin mass noted on the right side of the neck and left cheek  Neurological:     General: No focal deficit present.     Mental Status: He is alert and oriented to person, place, and time.     Labs on Admission: I have personally reviewed following labs and imaging studies  CBC: Recent Labs  Lab 11/11/20 1242  WBC 7.4  NEUTROABS 5.5  HGB 12.4*  HCT 40.0  MCV 82.1  PLT 99991111   Basic Metabolic Panel: Recent Labs  Lab 11/11/20 1242  NA 136  K 3.9  CL 105  CO2 24  GLUCOSE 84  BUN 16  CREATININE 0.88  CALCIUM 9.0   GFR: Estimated Creatinine Clearance: 91.2 mL/min (by C-G formula based on SCr of 0.88 mg/dL). Liver Function Tests: Recent Labs  Lab 11/11/20 1242  AST 17  ALT 13  ALKPHOS 80  BILITOT 0.8  PROT 6.8  ALBUMIN 3.9   Recent Labs  Lab 11/11/20 1242  LIPASE 27   No results for input(s): AMMONIA in the last 168 hours. Coagulation Profile: No results for input(s): INR, PROTIME in the last 168 hours. Cardiac Enzymes: No results for input(s): CKTOTAL, CKMB, CKMBINDEX, TROPONINI in the last 168 hours. BNP (last 3 results) No results for input(s): PROBNP in the last 8760 hours. HbA1C: No results for input(s): HGBA1C in the last 72 hours. CBG: No results for input(s): GLUCAP in the last 168 hours. Lipid Profile: No results for input(s): CHOL, HDL, LDLCALC, TRIG, CHOLHDL, LDLDIRECT in the last 72 hours. Thyroid Function Tests: No results for input(s): TSH, T4TOTAL, FREET4, T3FREE, THYROIDAB in the last 72 hours. Anemia Panel: No results for input(s): VITAMINB12, FOLATE, FERRITIN, TIBC, IRON, RETICCTPCT in the last 72 hours. Urine analysis:    Component Value Date/Time   COLORURINE YELLOW 11/11/2020 1654   APPEARANCEUR CLEAR 11/11/2020 1654   LABSPEC 1.026 11/11/2020 1654   PHURINE 5.0  11/11/2020 1654   GLUCOSEU NEGATIVE 11/11/2020 1654   HGBUR NEGATIVE 11/11/2020 1654   BILIRUBINUR NEGATIVE 11/11/2020 1654   KETONESUR 20 (A) 11/11/2020 1654   PROTEINUR NEGATIVE 11/11/2020 1654   UROBILINOGEN 1.0 11/29/2014 1601   NITRITE NEGATIVE 11/11/2020 1654   LEUKOCYTESUR NEGATIVE 11/11/2020 1654    Radiological Exams on Admission: CT ABDOMEN PELVIS W CONTRAST  Result Date: 11/11/2020 CLINICAL DATA:  RIGHT UPPER QUADRANT pain. History of rupture ulcer. EXAM: CT ABDOMEN AND PELVIS WITH CONTRAST TECHNIQUE: Multidetector CT imaging of the abdomen and pelvis was performed using the standard protocol following bolus administration of intravenous contrast. CONTRAST:  61m OMNIPAQUE IOHEXOL  350 MG/ML SOLN COMPARISON:  12/09/2016 FINDINGS: Lower chest: Lung bases are unremarkable.  Heart size is normal. Hepatobiliary: Liver is mildly heterogeneous without suspicious lesion. There are tiny subcentimeter probable cysts noted. Gallbladder is present. Pancreas: Unremarkable. No pancreatic ductal dilatation or surrounding inflammatory changes. Spleen: Normal in size without focal abnormality. Adrenals/Urinary Tract: Adrenal glands are unremarkable. Kidneys are normal, without renal calculi, focal lesion, or hydronephrosis. Bladder is decompressed. Stomach/Bowel: Stomach has a normal appearance. There are dilated loops of small bowel primarily within the LEFT UPPER QUADRANT without definite transition zone. The more distal small bowel loops are normal in caliber. Colon is unremarkable. Evaluation of bowel is slightly limited by a lack of oral contrast. The appendix is normal. Vascular/Lymphatic: There is atherosclerotic calcification of the abdominal aorta, not associated with aneurysm. Although involved by atherosclerosis, there is vascular opacification of the celiac axis, superior mesenteric artery, and inferior mesenteric artery. Normal appearance of the portal venous system and inferior vena cava.  Portal venous system is unremarkable. No retroperitoneal or mesenteric adenopathy. Reproductive: Prostate is unremarkable. Other: Small amount of ascites. No free intraperitoneal air. Abdominal wall is unremarkable. Musculoskeletal: There are degenerative changes in the lumbar spine. IMPRESSION: 1. Mildly dilated loops of small bowel primarily within the LEFT UPPER QUADRANT, without definite transition zone. The findings favor ileus over bowel obstruction. 2. Normal appendix. 3. Small amount of ascites. Electronically Signed   By: Nolon Nations M.D.   On: 11/11/2020 17:05   US Abdomen Limited  Result Date: 11/11/2020 CLINICAL DATA:  Right upper quadrant pain. EXAM: ULTRASOUND ABDOMEN LIMITED RIGHT UPPER QUADRANT COMPARISON:  Current CT of the abdomen and pelvis. FINDINGS: Gallbladder: Distended gallbladder. Mild wall irregularity, without wall thickening. Small dependent echogenic foci suggested possibly stones or small polyps. No pericholecystic fluid. No sonographic Murphy's sign. Common bile duct: Diameter: 6 mm Liver: No focal lesion identified. Within normal limits in parenchymal echogenicity. Portal vein is patent on color Doppler imaging with normal direction of blood flow towards the liver. Other: Small amount of ascites. IMPRESSION: 1. No acute findings. 2. Distended gallbladder. Wall appears slightly irregular but is not thickened. Small dependent echogenic foci may reflect stones or small polyps. 3. Common bile duct top normal in caliber.  Liver unremarkable. Electronically Signed   By: Lajean Manes M.D.   On: 11/11/2020 19:00    EKG: Pending at this time.  Assessment/Plan Principal Problem:   Right upper quadrant abdominal pain Active Problems:   Anemia   Smoker   PUD (peptic ulcer disease)   Marijuana use   Right upper quadrant abdominal pain, nausea Patient with history of emergency exploratory laparotomy and Graham patch repair of a perforated prepyloric ulcer in September 2019  presenting with 1 week history of right upper quadrant abdominal pain and nausea.  No episodes of vomiting.  No fever or leukocytosis.  Lipase and LFTs normal. Right upper quadrant ultrasound showing distended gallbladder with slight wall irregularity but no thickening, small dependent echogenic foci may reflect stones or small polyps.  Also CBD normal in caliber, liver unremarkable. CT abdomen pelvis showing mildly dilated loops of small bowel primarily within the left upper quadrant without definite transition zone; findings favor ileus over bowel obstruction.  Patient denies pain in his left upper quadrant.   -Bowel rest, IV fluid hydration, pain control, antiemetic as needed, monitor electrolytes, serial abdominal exams. -Discussed with Dr. Marlou Starks from general surgery, not recommending HIDA scan or any further imaging but feels that the patient may need cholecystectomy  given complaints of right upper quadrant abdominal pain and distended gallbladder with possible gallstones on ultrasound.  General surgery will consult in a.m., appreciate help.  Subcutaneous skin masses Mobile, nontender subcutaneous skin mass noted on the right side of the neck and left cheek.  Patient states the neck mass has been present for the past 4 years and has slowly grown over time.  States the mass on his left cheek has been present for longer than 4 years.  Denies any pain or noticing any drainage. ?Lipomas. -Ultrasound ordered for further evaluation  Chronic anemia Hemoglobin stable.  Marijuana use -Counseled to quit  Tobacco use -Counseled to quit, NicoDerm patch  PUD -Continue PPI  DVT prophylaxis: SCDs Code Status: Full code Family Communication: No family available at this time. Disposition Plan: Status is: Observation  The patient remains OBS appropriate and will d/c before 2 midnights.  Dispo: The patient is from: Home              Anticipated d/c is to: Home              Patient currently is not  medically stable to d/c.   Difficult to place patient No  Level of care: Level of care: Med-Surg  The medical decision making on this patient was of high complexity and the patient is at high risk for clinical deterioration, therefore this is a level 3 visit.  Shela Leff MD [facility]  If 7PM-7AM, please contact night-coverage [email or web address]  11/11/2020, 8:39 PM

## 2020-11-11 NOTE — ED Triage Notes (Signed)
Pt bib [facility] from home d/t RUQ abd pain that began yesterday. +nausea.

## 2020-11-11 NOTE — ED Provider Notes (Signed)
Emergency Medicine Provider Triage Evaluation Note  Marvin Thomas , a 55 y.o. male  was evaluated in triage.  Pt complains of right upper quadrant abdominal pain that began yesterday.  Patient endorses associated nausea.  Pain has been constant since starting.  Patient reports pain feels similar to ruptured ulcer he had in 2019.  Patient reports that he does not follow-up with gastroenterology.    Denies any fevers, chills, vomiting, constipation, diarrhea, blood in stool, melena.  Review of Systems  Positive: Abdominal pain, nausea Negative: evers, chills, vomiting, constipation, diarrhea, blood in stool, melena.  Physical Exam  BP (!) 118/96 (BP Location: Left Arm)   Pulse 65   Temp 98.8 F (37.1 C) (Oral)   Resp 16   Ht '5\' 9"'$  (1.753 m)   Wt 68 kg   SpO2 100%   BMI 22.15 kg/m  Gen:   Awake, no distress, appears uncomfortable due to complaints of pain Resp:  Normal effort  MSK:   Moves extremities without difficulty  Other:  Abdomen soft, nondistended, tenderness to right upper quadrant.  Medical Decision Making  Medically screening exam initiated at 12:26 PM.  Appropriate orders placed.  [name] was informed that the remainder of the evaluation will be completed by another provider, this initial triage assessment does not replace that evaluation, and the importance of remaining in the ED until their evaluation is complete.  Due to patient's history of perforated pyloric ulcer will obtain CT abdomen pelvis.   Dyann Ruddle 11/11/20 1228    Regan Lemming, MD 11/11/20 1254

## 2020-11-11 NOTE — ED Provider Notes (Signed)
[facility] DEPT Provider Note   CSN: [id number] Arrival date & time: 11/11/20  1213     History Chief Complaint  Patient presents with   Abdominal Pain    Marvin Thomas is a 55 y.o. male.  The history is provided by the patient and medical records. No language interpreter was used.  Abdominal Pain  55 year old male significant history of cholelithiasis, ruptured pyloric ulcer who presents for evaluation of abdominal pain.  Patient was brought here via EMS from home.  Patient mention he developed pain to his right upper quadrant does not last night.  Pain is described as a sharp sensation, nonradiating, happen sporadically, with associated nausea.  He also endorsed decreased appetite.  He does not complain of any fever or chills no lightheadedness dizziness chest pain shortness of breath productive cough dysuria hematuria or back pain.  Admits to history of tobacco use and marijuana use but denies alcohol use.  Past Medical History:  Diagnosis Date   Anemia Dx 2014   Gallstones    Hemothorax    Ulcer     Patient Active Problem List   Diagnosis Date Noted   Perforated ulcer ([place]) 12/09/2017   Hypokalemia 02/01/2014   Pain, dental 01/30/2014   Family history of early CAD 01/30/2014   Smoker 01/30/2014   Sebaceous cyst 01/30/2014   Smooth enlarged prostate 01/30/2014   Anemia     Past Surgical History:  Procedure Laterality Date   LAPAROTOMY N/A 12/09/2017   Procedure: EXPLORATORY LAPAROTOMY, REPAIR PERFORATED PYLEORIC ULCER;  Surgeon: Stark Klein, MD;  Location: [facility];  Service: General;  Laterality: N/A;       Family History  Problem Relation Age of Onset   Diabetes Mother    Hypertension Mother    Heart disease Mother        x 3    Heart disease Brother    Hypertension Brother    Diabetes Other    Hypertension Brother    Hypertension Brother    Cancer Neg Hx     Social History   Tobacco Use   Smoking status: Every  Day    Packs/day: 1.50    Years: 31.00    Pack years: 46.50    Types: Cigarettes   Smokeless tobacco: Never  Substance Use Topics   Alcohol use: Yes    Comment: quit 1980's    Drug use: Yes    Types: Marijuana    Comment: Patient reports using last night    Home Medications Prior to Admission medications   Medication Sig Start Date End Date Taking? Authorizing Provider  acetaminophen (TYLENOL) 500 MG tablet Take 1,000 mg by mouth every 6 (six) hours as needed for mild pain.    [provider]  pantoprazole (PROTONIX) 20 MG tablet Take 2 tablets (40 mg total) by mouth daily. 12/13/17   Excell Seltzer, MD    Allergies    Aspirin  Review of Systems   Review of Systems  Gastrointestinal:  Positive for abdominal pain.  All other systems reviewed and are negative.  Physical Exam Updated Vital Signs BP (!) 118/96 (BP Location: Left Arm)   Pulse 65   Temp 98.8 F (37.1 C) (Oral)   Resp 16   Ht '5\' 9"'$  (1.753 m)   Wt 68 kg   SpO2 100%   BMI 22.15 kg/m   Physical Exam Vitals and nursing note reviewed.  Constitutional:      General: He is not in acute distress.  Appearance: He is well-developed.  HENT:     Head: Atraumatic.  Eyes:     Conjunctiva/sclera: Conjunctivae normal.  Cardiovascular:     Rate and Rhythm: Normal rate and regular rhythm.     Heart sounds: Normal heart sounds.  Pulmonary:     Effort: Pulmonary effort is normal.     Breath sounds: Normal breath sounds.  Abdominal:     Palpations: Abdomen is soft.     Tenderness: There is abdominal tenderness (tenderness to right upper quadrant and epigastric region without guarding or rebound tenderness.).  Musculoskeletal:     Cervical back: Neck supple.  Skin:    Findings: No rash.  Neurological:     Mental Status: He is alert.    ED Results / Procedures / Treatments   Labs (all labs ordered are listed, but only abnormal results are displayed) Labs Reviewed  CBC WITH DIFFERENTIAL/PLATELET  - Abnormal; Notable for the following components:      Result Value   Hemoglobin 12.4 (*)    MCH 25.5 (*)    All other components within normal limits  URINALYSIS, ROUTINE W REFLEX MICROSCOPIC - Abnormal; Notable for the following components:   Ketones, ur 20 (*)    All other components within normal limits  COMPREHENSIVE METABOLIC PANEL  LIPASE, BLOOD    EKG None  Radiology CT ABDOMEN PELVIS W CONTRAST  Result Date: 11/11/2020 CLINICAL DATA:  RIGHT UPPER QUADRANT pain. History of rupture ulcer. EXAM: CT ABDOMEN AND PELVIS WITH CONTRAST TECHNIQUE: Multidetector CT imaging of the abdomen and pelvis was performed using the standard protocol following bolus administration of intravenous contrast. CONTRAST:  49m OMNIPAQUE IOHEXOL 350 MG/ML SOLN COMPARISON:  12/09/2016 FINDINGS: Lower chest: Lung bases are unremarkable.  Heart size is normal. Hepatobiliary: Liver is mildly heterogeneous without suspicious lesion. There are tiny subcentimeter probable cysts noted. Gallbladder is present. Pancreas: Unremarkable. No pancreatic ductal dilatation or surrounding inflammatory changes. Spleen: Normal in size without focal abnormality. Adrenals/Urinary Tract: Adrenal glands are unremarkable. Kidneys are normal, without renal calculi, focal lesion, or hydronephrosis. Bladder is decompressed. Stomach/Bowel: Stomach has a normal appearance. There are dilated loops of small bowel primarily within the LEFT UPPER QUADRANT without definite transition zone. The more distal small bowel loops are normal in caliber. Colon is unremarkable. Evaluation of bowel is slightly limited by a lack of oral contrast. The appendix is normal. Vascular/Lymphatic: There is atherosclerotic calcification of the abdominal aorta, not associated with aneurysm. Although involved by atherosclerosis, there is vascular opacification of the celiac axis, superior mesenteric artery, and inferior mesenteric artery. Normal appearance of the portal  venous system and inferior vena cava. Portal venous system is unremarkable. No retroperitoneal or mesenteric adenopathy. Reproductive: Prostate is unremarkable. Other: Small amount of ascites. No free intraperitoneal air. Abdominal wall is unremarkable. Musculoskeletal: There are degenerative changes in the lumbar spine. IMPRESSION: 1. Mildly dilated loops of small bowel primarily within the LEFT UPPER QUADRANT, without definite transition zone. The findings favor ileus over bowel obstruction. 2. Normal appendix. 3. Small amount of ascites. Electronically Signed   By: ENolon NationsM.D.   On: 11/11/2020 17:05   UKoreaAbdomen Limited  Result Date: 11/11/2020 CLINICAL DATA:  Right upper quadrant pain. EXAM: ULTRASOUND ABDOMEN LIMITED RIGHT UPPER QUADRANT COMPARISON:  Current CT of the abdomen and pelvis. FINDINGS: Gallbladder: Distended gallbladder. Mild wall irregularity, without wall thickening. Small dependent echogenic foci suggested possibly stones or small polyps. No pericholecystic fluid. No sonographic Murphy's sign. Common bile duct: Diameter: 6 mm  Liver: No focal lesion identified. Within normal limits in parenchymal echogenicity. Portal vein is patent on color Doppler imaging with normal direction of blood flow towards the liver. Other: Small amount of ascites. IMPRESSION: 1. No acute findings. 2. Distended gallbladder. Wall appears slightly irregular but is not thickened. Small dependent echogenic foci may reflect stones or small polyps. 3. Common bile duct top normal in caliber.  Liver unremarkable. Electronically Signed   By: Lajean Manes M.D.   On: 11/11/2020 19:00    Procedures Procedures   Medications Ordered in ED Medications  morphine 4 MG/ML injection 4 mg (has no administration in time range)  oxyCODONE-acetaminophen (PERCOCET/ROXICET) 5-325 MG per tablet 1 tablet (1 tablet Oral Given 11/11/20 1233)  morphine 4 MG/ML injection 4 mg (4 mg Intravenous Given 11/11/20 1624)  sodium  chloride 0.9 % bolus 1,000 mL (0 mLs Intravenous Stopped 11/11/20 1820)  iohexol (OMNIPAQUE) 350 MG/ML injection 80 mL (80 mLs Intravenous Contrast Given 11/11/20 1638)    ED Course  I have reviewed the triage vital signs and the nursing notes.  Pertinent labs & imaging results that were available during my care of the patient were reviewed by me and considered in my medical decision making (see chart for details).    MDM Rules/Calculators/A&P                           BP (!) 166/79   Pulse (!) 54   Temp 98.8 F (37.1 C) (Oral)   Resp 17   Ht '5\' 9"'$  (1.753 m)   Wt 68 kg   SpO2 100%   BMI 22.15 kg/m   Final Clinical Impression(s) / ED Diagnoses Final diagnoses:  Ileus ([place])    Rx / DC Orders ED Discharge Orders     None      4:08 PM Patient complaining of pain to his upper abdomen that started since yesterday.  Does have history of gallstones.  Also had history of rupture pyloric ulcer requiring surgical intervention.  Labs are reassuring.  CT scan ordered.  Pain medication given.  7:18 PM Labs are reassuring, CT scan of the abdomen pelvis demonstrate a mildly dilated loop of small bowel primary within the left upper quadrant without definitive transition zone.  The finding favors ileus over small bowel obstruction.  Small amount of society were noted.  I also obtain a limited abdominal ultrasound which shows no acute finding.  There is a distended gallbladder and the wall is slightly irregular but not thickened.  There is some evidence of gallstones or small polyps noted.  On reexamination patient still endorse having abdominal pain.  Even though she is not nauseous or vomiting, he does not feel comfortable going home.  Patient may be best admitted to the hospital for observation.  7:35 PM Appreciate consultation from [facility] Dr. Marlowe Sax who agrees to see and will admit for further care.     Domenic Moras, PA-C 11/11/20 1937    Daleen Bo, MD 11/11/20  [phone]

## 2020-11-12 DIAGNOSIS — R1011 Right upper quadrant pain: Secondary | ICD-10-CM

## 2020-11-12 DIAGNOSIS — R109 Unspecified abdominal pain: Secondary | ICD-10-CM | POA: Diagnosis present

## 2020-11-12 LAB — RAPID URINE DRUG SCREEN, HOSP PERFORMED
Amphetamines: NOT DETECTED
Barbiturates: NOT DETECTED
Benzodiazepines: NOT DETECTED
Cocaine: POSITIVE — AB
Opiates: POSITIVE — AB
Tetrahydrocannabinol: POSITIVE — AB

## 2020-11-12 LAB — BASIC METABOLIC PANEL
Anion gap: 9 (ref 5–15)
BUN: 18 mg/dL (ref 6–20)
CO2: 22 mmol/L (ref 22–32)
Calcium: 8.7 mg/dL — ABNORMAL LOW (ref 8.9–10.3)
Chloride: 103 mmol/L (ref 98–111)
Creatinine, Ser: 0.97 mg/dL (ref 0.61–1.24)
GFR, Estimated: >60 mL/min (ref >60)
Glucose, Bld: 69 mg/dL — ABNORMAL LOW (ref 70–99)
Potassium: 4.4 mmol/L (ref 3.5–5.1)
Sodium: 134 mmol/L — ABNORMAL LOW (ref 135–145)

## 2020-11-12 LAB — HEPATIC FUNCTION PANEL
ALT: 12 U/L (ref 0–44)
AST: 22 U/L (ref 15–41)
Albumin: 3.7 g/dL (ref 3.5–5.0)
Alkaline Phosphatase: 80 U/L (ref 38–126)
Bilirubin, Direct: 0.2 mg/dL (ref 0.0–0.2)
Indirect Bilirubin: 0.4 mg/dL (ref 0.3–0.9)
Total Bilirubin: 0.6 mg/dL (ref 0.3–1.2)
Total Protein: 7 g/dL (ref 6.5–8.1)

## 2020-11-12 LAB — FERRITIN: Ferritin: 37 ng/mL (ref 24–336)

## 2020-11-12 LAB — PROCALCITONIN: Procalcitonin: <0.1 ng/mL

## 2020-11-12 LAB — GLUCOSE, CAPILLARY
Glucose-Capillary: 77 mg/dL (ref 70–99)
Glucose-Capillary: 113 mg/dL — ABNORMAL HIGH (ref 70–99)
Glucose-Capillary: 76 mg/dL (ref 70–99)

## 2020-11-12 LAB — HIV ANTIBODY (ROUTINE TESTING W REFLEX): HIV Screen 4th Generation wRfx: NONREACTIVE

## 2020-11-12 LAB — LACTATE DEHYDROGENASE: LDH: 123 U/L (ref 98–192)

## 2020-11-12 LAB — C-REACTIVE PROTEIN: CRP: <0.5 mg/dL (ref <1.0)

## 2020-11-12 LAB — D-DIMER, QUANTITATIVE: D-Dimer, Quant: 0.35 ug/mL-FEU (ref 0.00–0.50)

## 2020-11-12 LAB — MAGNESIUM: Magnesium: 2 mg/dL (ref 1.7–2.4)

## 2020-11-12 LAB — SARS CORONAVIRUS 2 (TAT 6-24 HRS): SARS Coronavirus 2: POSITIVE — AB

## 2020-11-12 MED ORDER — ALBUTEROL SULFATE HFA 108 (90 BASE) MCG/ACT IN AERS
2.0000 | INHALATION_SPRAY | Freq: Four times a day (QID) | RESPIRATORY_TRACT | Status: DC
  Administered 2020-11-12 – 2020-11-13 (×2): 2 via RESPIRATORY_TRACT
  Filled 2020-11-12: qty 6.7

## 2020-11-12 MED ORDER — DEXTROSE-NACL 5-0.9 % IV SOLN: INTRAVENOUS | Status: DC

## 2020-11-12 MED ORDER — PANTOPRAZOLE SODIUM 40 MG IV SOLR
40.0000 mg | Freq: Two times a day (BID) | INTRAVENOUS | Status: DC
  Administered 2020-11-12 (×2): 40 mg via INTRAVENOUS
  Filled 2020-11-12 (×2): qty 40

## 2020-11-12 MED ORDER — KETOROLAC TROMETHAMINE 15 MG/ML IJ SOLN
15.0000 mg | Freq: Four times a day (QID) | INTRAMUSCULAR | Status: DC | PRN
  Administered 2020-11-12 – 2020-11-13 (×3): 15 mg via INTRAVENOUS
  Filled 2020-11-12 (×3): qty 1

## 2020-11-12 NOTE — Progress Notes (Signed)
PROGRESS NOTE    Marvin Thomas  [id number] DOB: December 11, 1965 DOA: 11/11/2020 PCP: Patient, No Pcp Per (Inactive)   Chief Complaint  Patient presents with   Abdominal Pain  Brief Narrative: 55 year old male with significant history of emergency 6 left and [place] patch repair of perforated prepyloric ulcer in September 2019, anemia, gallstones, hemothorax, marijuana use, tobacco use presented to the ED with right upper quadrant abdominal pain x1 week with nausea.  In ED- Labs showing WBC 7.4, hemoglobin 12.4 (stable), platelet count 265k.  Sodium 136, potassium 3.9, chloride 105, bicarb 24, BUN 16, creatinine 0.8, glucose 84.  Lipase and LFTs normal.  UA without signs of infection.  Screening COVID test pending.  CT abdomen pelvis showing mildly dilated loops of small bowel primarily within the left upper quadrant without definite transition zone; findings favor ileus over bowel obstruction.  Right upper quadrant ultrasound showing distended gallbladder with slight wall irregularity but no thickening, small dependent echogenic foci may reflect stones or small polyps.  Also CBD normal in caliber, liver unremarkable  Subjective: Seen this morning. CBG 69 this am Reports he had pain this morning and improved with morphine.  Assessment & Plan:  Right upper quadrant abdominal pain/possible cholelithiasis/polyps: Previous history of perforated prepyloric ulcer in September 2019.  Currently no leukocytosis lipase and LFTs are normal right upper quadrant ultrasound showing distended gallbladder with slight wall irregularity but no thickening, CT abdomen pelvis shows mildly dilated loops of small bowel primarily in the left upper quadrant without definite transition point.  Surgery has been consulted planning for HIDA scan.  Continue on bowel rest IV fluid hydration pain control with IV opiates, add Toradol, continue antiemetics.  COVID-19 positive on swab, incidental finding ,no respiratory  symptoms.  Monitor.  Will check CRP D-dimer for baseline evaluation. Recent Labs    11/12/20 0958  DDIMER 0.35  LDH 123   Hypoglycemia 69 this morning: Placed on dextrose IV fluids.  Monitor CBC  Subcutaneous skin masses present for 4 years or longer/?  Lipomas-ultrasound shows nonspecific and abnormal lymph node versus soft tissue mass to consider contrast-enhanced CT or MRI for further evaluation   Anemia likely from chronic disease. Mild, monitor Recent Labs  Lab 11/11/20 1242  HGB 12.4*  HCT 40.0    PUD history- keep on PPI.  Tobacco use/Marijuana use: Cessation was counseled on admission. Diet Order             Diet NPO time specified  Diet effective now                   Patient's Body mass index is 22.15 kg/m. DVT prophylaxis: SCDs Start: 11/11/20 1958 Code Status:   Code Status: Full Code  Family Communication: plan of care discussed with patient at bedside. Status is: admitted as Observation Patient remains hospitalized will need at least 2 midnight stay needing IV morphine for pain control needs further evaluation and surgery follow-up Dispo: The patient is from: Home              Anticipated d/c is to: Home              Patient currently is not medically stable to d/c.   Difficult to place patient No  Unresulted Labs (From admission, onward)     Start     Ordered   11/13/20 0500  Lipase, blood  Tomorrow morning,   R       Question:  Specimen collection method  Answer:  Lab=Lab  collect   11/12/20 0828   11/13/20 0500  CBC  Tomorrow morning,   R       Question:  Specimen collection method  Answer:  Lab=Lab collect   11/12/20 0933   11/13/20 0500  Comprehensive metabolic panel  Tomorrow morning,   R       Question:  Specimen collection method  Answer:  Lab=Lab collect   11/12/20 0933   11/13/20 0500  C-reactive protein  Daily,   R     Question:  Specimen collection method  Answer:  Lab=Lab collect   11/12/20 0935   11/12/20 0936  Ferritin  Add-on,    AD       Question:  Specimen collection method  Answer:  Lab=Lab collect   11/12/20 0935   11/12/20 0935  Procalcitonin  Add-on,   AD       Question:  Specimen collection method  Answer:  Lab=Lab collect   11/12/20 0935   11/12/20 0935  C-reactive protein  Add-on,   AD       Question:  Specimen collection method  Answer:  Lab=Lab collect   11/12/20 0935           Medications reviewed:  Scheduled Meds:  albuterol  2 puff Inhalation Q6H   mouth rinse  15 mL Mouth Rinse BID   nicotine  21 mg Transdermal Daily   pantoprazole (PROTONIX) IV  40 mg Intravenous Q12H   Continuous Infusions:  dextrose 5 % and 0.9% NaCl 125 mL/hr at 11/12/20 [id number]   Consultants:see note  Procedures:see note Antimicrobials: Anti-infectives (From admission, onward)    None      Culture/Microbiology    Component Value Date/Time   SDES URINE, CLEAN CATCH 11/08/2012 0741   SPECREQUEST NONE 11/08/2012 0741   CULT NO GROWTH Performed at [facility] 11/08/2012 0741   REPTSTATUS 11/09/2012 FINAL 11/08/2012 0741    Other culture-see note  Objective: Vitals: Today's Vitals   11/12/20 0653 11/12/20 0745 11/12/20 1002 11/12/20 1132  BP: (!) 159/82   (!) 165/82  Pulse: (!) 59   (!) 56  Resp: 15   20  Temp: 98.9 F (37.2 C)   98.2 F (36.8 C)  TempSrc: Oral     SpO2: 98%   100%  Weight:      Height:      PainSc:  Asleep 4      Intake/Output Summary (Last 24 hours) at 11/12/2020 1147 Last data filed at 11/12/2020 0520 Gross per 24 hour  Intake 367.03 ml  Output 300 ml  Net 67.03 ml   Filed Weights   11/11/20 1224  Weight: 68 kg   Weight change:   Intake/Output from previous day: 08/21 0701 - 08/22 0700 In: 367 [I.V.:367] Out: 300 [Urine:300] Intake/Output this shift: No intake/output data recorded. Filed Weights   11/11/20 1224  Weight: 68 kg   Examination: General exam: AAO x3, pleasant HEENT:Oral mucosa moist, Ear/Nose WNL grossly,dentition normal. Respiratory  system: bilaterally clear breath sounds no use of accessory muscle, non tender. Cardiovascular system: S1 & S2 +, no JVD. Gastrointestinal system: Abdomen soft, mildly tender right upper quadrant,ND, BS+. Nervous System:Alert, awake, moving extremities Extremities: no edema, distal peripheral pulses palpable.  Skin: No rashes,no icterus. MSK: Normal muscle bulk,tone, power Data Reviewed: I have personally reviewed following labs and imaging studies CBC: Recent Labs  Lab 11/11/20 1242  WBC 7.4  NEUTROABS 5.5  HGB 12.4*  HCT 40.0  MCV 82.1  PLT 265   Basic  Metabolic Panel: Recent Labs  Lab 11/11/20 1242 11/12/20 0454  NA 136 134*  K 3.9 4.4  CL 105 103  CO2 24 22  GLUCOSE 84 69*  BUN 16 18  CREATININE 0.88 0.97  CALCIUM 9.0 8.7*  MG  --  2.0   GFR: Estimated Creatinine Clearance: 82.8 mL/min (by C-G formula based on SCr of 0.97 mg/dL). Liver Function Tests: Recent Labs  Lab 11/11/20 1242 11/12/20 0454  AST 17 22  ALT 13 12  ALKPHOS 80 80  BILITOT 0.8 0.6  PROT 6.8 7.0  ALBUMIN 3.9 3.7   Recent Labs  Lab 11/11/20 1242  LIPASE 27   No results for input(s): AMMONIA in the last 168 hours. Coagulation Profile: No results for input(s): INR, PROTIME in the last 168 hours. Cardiac Enzymes: No results for input(s): CKTOTAL, CKMB, CKMBINDEX, TROPONINI in the last 168 hours. BNP (last 3 results) No results for input(s): PROBNP in the last 8760 hours. HbA1C: No results for input(s): HGBA1C in the last 72 hours. CBG: Recent Labs  Lab 11/12/20 0714 11/12/20 0935  GLUCAP 77 113*   Lipid Profile: No results for input(s): CHOL, HDL, LDLCALC, TRIG, CHOLHDL, LDLDIRECT in the last 72 hours. Thyroid Function Tests: No results for input(s): TSH, T4TOTAL, FREET4, T3FREE, THYROIDAB in the last 72 hours. Anemia Panel: No results for input(s): VITAMINB12, FOLATE, FERRITIN, TIBC, IRON, RETICCTPCT in the last 72 hours. Sepsis Labs: No results for input(s): PROCALCITON,  LATICACIDVEN in the last 168 hours.  Recent Results (from the past 240 hour(s))  SARS CORONAVIRUS 2 (TAT 6-24 HRS) Nasopharyngeal Nasopharyngeal Swab     Status: Abnormal   Collection Time: 11/11/20  9:02 PM   Specimen: Nasopharyngeal Swab  Result Value Ref Range Status   SARS Coronavirus 2 POSITIVE (A) NEGATIVE Final    Comment: (NOTE) SARS-CoV-2 target nucleic acids are DETECTED.  The SARS-CoV-2 RNA is generally detectable in upper and lower respiratory specimens during the acute phase of infection. Positive results are indicative of the presence of SARS-CoV-2 RNA. Clinical correlation with patient history and other diagnostic information is  necessary to determine patient infection status. Positive results do not rule out bacterial infection or co-infection with other viruses.  The expected result is Negative.  Fact Sheet for Patients: SugarRoll.be  Fact Sheet for Healthcare Providers: https://www.woods-mathews.com/  This test is not yet approved or cleared by the [place] FDA and  has been authorized for detection and/or diagnosis of SARS-CoV-2 by FDA under an Emergency Use Authorization (EUA). This EUA will remain  in effect (meaning this test can be used) for the duration of the COVID-19 declaration under Section 564(b)(1) of the Act, 21 U. S.C. section 360bbb-3(b)(1), unless the authorization is terminated or revoked sooner.   Performed at [facility], [place] [address], [place], [place] [address]      Radiology Studies: US SOFT TISSUE HEAD & NECK (NON-THYROID)  Result Date: 11/11/2020 CLINICAL DATA:  Subcutaneous mass on the right side of neck and on the left cheek for several years. EXAM: ULTRASOUND OF HEAD/NECK SOFT TISSUES TECHNIQUE: Ultrasound examination of the head and neck soft tissues was performed in the area of clinical concern. COMPARISON:  None. FINDINGS: Heterogeneous circumscribed ovoid nodular soft  tissue attenuation lesions are demonstrated correlating to the palpable lesions in the left cheek and right lateral neck. The right neck lesion measures 1.6 x 0.8 x 1.5 cm. The left cheek lesion measures 1.8 x 0.6 x 2 cm. The appearance is nonspecific and  could represent enlarged lymph nodes although typical lymph node architecture is not present suggesting abnormal lymph nodes. Other solid soft tissue mass or neoplasm could also have this appearance. Consider contrast-enhanced CT or MRI for further evaluation. IMPRESSION: Ovoid solid nodules demonstrated in the left cheek and right neck corresponding to palpable lesions. Appearances are nonspecific and abnormal lymph node versus soft tissue mass is possible. Consider contrast-enhanced CT or MRI for further evaluation. Electronically Signed   By: Lucienne Capers M.D.   On: 11/11/2020 21:22   CT ABDOMEN PELVIS W CONTRAST  Result Date: 11/11/2020 CLINICAL DATA:  RIGHT UPPER QUADRANT pain. History of rupture ulcer. EXAM: CT ABDOMEN AND PELVIS WITH CONTRAST TECHNIQUE: Multidetector CT imaging of the abdomen and pelvis was performed using the standard protocol following bolus administration of intravenous contrast. CONTRAST:  65m OMNIPAQUE IOHEXOL 350 MG/ML SOLN COMPARISON:  12/09/2016 FINDINGS: Lower chest: Lung bases are unremarkable.  Heart size is normal. Hepatobiliary: Liver is mildly heterogeneous without suspicious lesion. There are tiny subcentimeter probable cysts noted. Gallbladder is present. Pancreas: Unremarkable. No pancreatic ductal dilatation or surrounding inflammatory changes. Spleen: Normal in size without focal abnormality. Adrenals/Urinary Tract: Adrenal glands are unremarkable. Kidneys are normal, without renal calculi, focal lesion, or hydronephrosis. Bladder is decompressed. Stomach/Bowel: Stomach has a normal appearance. There are dilated loops of small bowel primarily within the LEFT UPPER QUADRANT without definite transition zone. The  more distal small bowel loops are normal in caliber. Colon is unremarkable. Evaluation of bowel is slightly limited by a lack of oral contrast. The appendix is normal. Vascular/Lymphatic: There is atherosclerotic calcification of the abdominal aorta, not associated with aneurysm. Although involved by atherosclerosis, there is vascular opacification of the celiac axis, superior mesenteric artery, and inferior mesenteric artery. Normal appearance of the portal venous system and inferior vena cava. Portal venous system is unremarkable. No retroperitoneal or mesenteric adenopathy. Reproductive: Prostate is unremarkable. Other: Small amount of ascites. No free intraperitoneal air. Abdominal wall is unremarkable. Musculoskeletal: There are degenerative changes in the lumbar spine. IMPRESSION: 1. Mildly dilated loops of small bowel primarily within the LEFT UPPER QUADRANT, without definite transition zone. The findings favor ileus over bowel obstruction. 2. Normal appendix. 3. Small amount of ascites. Electronically Signed   By: ENolon NationsM.D.   On: 11/11/2020 17:05   UKoreaAbdomen Limited  Result Date: 11/11/2020 CLINICAL DATA:  Right upper quadrant pain. EXAM: ULTRASOUND ABDOMEN LIMITED RIGHT UPPER QUADRANT COMPARISON:  Current CT of the abdomen and pelvis. FINDINGS: Gallbladder: Distended gallbladder. Mild wall irregularity, without wall thickening. Small dependent echogenic foci suggested possibly stones or small polyps. No pericholecystic fluid. No sonographic Murphy's sign. Common bile duct: Diameter: 6 mm Liver: No focal lesion identified. Within normal limits in parenchymal echogenicity. Portal vein is patent on color Doppler imaging with normal direction of blood flow towards the liver. Other: Small amount of ascites. IMPRESSION: 1. No acute findings. 2. Distended gallbladder. Wall appears slightly irregular but is not thickened. Small dependent echogenic foci may reflect stones or small polyps. 3. Common  bile duct top normal in caliber.  Liver unremarkable. Electronically Signed   By: DLajean ManesM.D.   On: 11/11/2020 19:00     LOS: 0 days   RAntonieta Pert MD [facility]  11/12/2020, 11:47 AM

## 2020-11-12 NOTE — Progress Notes (Signed)
1130- Pt transferrred to 4W room 445 from [place]. Pt is alert and oriented x4. Lungs diminished bilaterally. Bowel sounds hypoactive x4. Pt oriented to room and surroundings by this nurse and also discussed plan of care. Pt verbalized understanding. Will monitor.  1700- Pt yelling profanity at this nurse stating he called out several minutes ago and no one came to his room. Pt made aware of pt responsibility to be respectful to those persons providing care or security would be called. Pt medicated with MS04 '1mg'$  and calmed down.

## 2020-11-12 NOTE — Consult Note (Signed)
[facility] Surgery Consult  Note  Marvin Thomas Nov 12, 1965  [id number].    Requesting MD: Antonieta Pert Chief Complaint: Right upper quadrant pain and nausea Reason for Consult: abdominal pain  HPI:  Patient is a 55 year old male presented to the ED yesterday around noon with right upper quadrant pain and nausea.  Pain was constant and felt similar to his previous perforated ulcer.  He described the pain as sharp sensation nonradiating happens sporadically with nausea.  The symptoms come and go.  They are not associated with p.o. intake.  He says he is not eating when his belly hurts.  He points to midline and right upper quadrant as a source of the discomfort.  He has not had a bowel movement in 5 days.  He reports 1 episode of similar abdominal pain about 2 weeks ago.  He used his finger to make himself vomit and has not had problems again until about 2 days ago when abdominal discomfort started again.  He reports using tobacco, and marijuana daily.  He reports he does not use alcohol since 1989.     Work-up on admission yesterday shows a CMP is normal.  There are no LFT elevations, lipase is 27.  WBC 7.4, hemoglobin 12.4, hematocrit 40, platelets 265,000.  Repeat BMP is normal this a.m. also.  HIV screen is negative.  Urinalysis is negative.  Labs show he is COVID-positive.  CT of the abdomen showed some mildly dilated loops related loops of small bowel primarily within the left upper quadrant without a transition zone normal appendix, small amount of ascites, hepatobiliary showed some tiny subcentimeter probable cyst within the gallbladder present.  Pancreas was unremarkable. Abdominal ultrasound: Shows a distended gallbladder mild wall irregularity without thickening, small dependent echogenic foci suggest possible stones or polyps.  No pericholecystic fluid,No Murphy sign, CBD was 6 mm.  ROS: Review of Systems  Constitutional: Negative.   HENT: Negative.    Eyes: Negative.    Respiratory: Negative.    Cardiovascular: Negative.   Gastrointestinal:  Positive for abdominal pain, constipation, nausea and vomiting. Negative for blood in stool, diarrhea, heartburn and melena.  Genitourinary: Negative.   Musculoskeletal: Negative.   Skin: Negative.        He has some lipomas upper neck area.  Neurological: Negative.   Endo/Heme/Allergies: Negative.   Psychiatric/Behavioral: Negative.     Family History  Problem Relation Age of Onset   Diabetes Mother    Hypertension Mother    Heart disease Mother        x 3    Heart disease Brother    Hypertension Brother    Diabetes Other    Hypertension Brother    Hypertension Brother    Cancer Neg Hx     Past Medical History:  Diagnosis Date   Anemia Dx 2014   Gallstones    Hemothorax    Ulcer     Past Surgical History:  Procedure Laterality Date   LAPAROTOMY N/A 12/09/2017   Procedure: EXPLORATORY LAPAROTOMY, REPAIR PERFORATED PYLEORIC ULCER;  Surgeon: Stark Klein, MD;  Location: [facility];  Service: General;  Laterality: N/A;    Social History:  reports that he has been smoking cigarettes. He has a 46.50 pack-year smoking history. He has never used smokeless tobacco. He reports current alcohol use. He reports current drug use. Drug: Marijuana. EtOH: None since 1989. Tobacco: 1-1/2 packs/day Drugs: Marijuana daily-QOD He does lawn maintenance. Allergies:  Allergies  Allergen Reactions   Aspirin Nausea Only  Medications Prior to Admission  Medication Sig Dispense Refill   pantoprazole (PROTONIX) 20 MG tablet Take 2 tablets (40 mg total) by mouth daily. (Patient not taking: Reported on 11/11/2020) 30 tablet 0    Blood pressure (!) 159/82, pulse (!) 59, temperature 98.9 F (37.2 C), temperature source Oral, resp. rate 15, height '5\' 9"'$  (1.753 m), weight 68 kg, SpO2 98 %. Physical Exam: His previous perforated ulcer in 2019. General: pleasant, thin cachectic AA male who is laying in bed in NAD HEENT:  head is normocephalic, atraumatic.  Sclera are noninjected.  Pupils are equal.  Ears and nose without any masses or lesions.  Mouth is pink and moist Heart: regular, rate, and rhythm.  Normal s1,s2. No obvious murmurs, gallops, or rubs noted.  Palpable radial and pedal pulses bilaterally Lungs: CTAB, no wheezes, rhonchi, or rales noted.  Respiratory effort nonlabored Abd: soft, tender in a line going from his midline to his right upper quadrant over the costal margin.  ND, +BS, no masses, hernias, or organomegaly MS: all 4 extremities are symmetrical with no cyanosis, clubbing, or edema. Skin: warm and dry with no masses, lesions, or rashes Neuro: Cranial nerves 2-12 grossly intact, sensation is normal throughout Psych: A&Ox3 with an appropriate affect.   Results for orders placed or performed during the hospital encounter of 11/11/20 (from the past 48 hour(s))  Comprehensive metabolic panel     Status: None   Collection Time: 11/11/20 12:42 PM  Result Value Ref Range   Sodium 136 135 - 145 mmol/L   Potassium 3.9 3.5 - 5.1 mmol/L   Chloride 105 98 - 111 mmol/L   CO2 24 22 - 32 mmol/L   Glucose, Bld 84 70 - 99 mg/dL    Comment: Glucose reference range applies only to samples taken after fasting for at least 8 hours.   BUN 16 6 - 20 mg/dL   Creatinine, Ser 0.88 0.61 - 1.24 mg/dL   Calcium 9.0 8.9 - 10.3 mg/dL   Total Protein 6.8 6.5 - 8.1 g/dL   Albumin 3.9 3.5 - 5.0 g/dL   AST 17 15 - 41 U/L   ALT 13 0 - 44 U/L   Alkaline Phosphatase 80 38 - 126 U/L   Total Bilirubin 0.8 0.3 - 1.2 mg/dL   GFR, Estimated >60 >60 mL/min    Comment: (NOTE) Calculated using the CKD-EPI Creatinine Equation (2021)    Anion gap 7 5 - 15    Comment: Performed at [facility], [place] [address], [place], [place] [address]  CBC with Differential     Status: Abnormal   Collection Time: 11/11/20 12:42 PM  Result Value Ref Range   WBC 7.4 4.0 - 10.5 K/uL   RBC 4.87 4.22 - 5.81 MIL/uL    Hemoglobin 12.4 (L) 13.0 - 17.0 g/dL   HCT 40.0 39.0 - 52.0 %   MCV 82.1 80.0 - 100.0 fL   MCH 25.5 (L) 26.0 - 34.0 pg   MCHC 31.0 30.0 - 36.0 g/dL   RDW 13.4 11.5 - 15.5 %   Platelets 265 150 - 400 K/uL   nRBC 0.0 0.0 - 0.2 %   Neutrophils Relative % 74 %   Neutro Abs 5.5 1.7 - 7.7 K/uL   Lymphocytes Relative 19 %   Lymphs Abs 1.4 0.7 - 4.0 K/uL   Monocytes Relative 5 %   Monocytes Absolute 0.4 0.1 - 1.0 K/uL   Eosinophils Relative 1 %   Eosinophils Absolute 0.1 0.0 -  0.5 K/uL   Basophils Relative 1 %   Basophils Absolute 0.0 0.0 - 0.1 K/uL   Immature Granulocytes 0 %   Abs Immature Granulocytes 0.02 0.00 - 0.07 K/uL    Comment: Performed at [facility], [place] [address] [place], [place] [address]  Lipase, blood     Status: None   Collection Time: 11/11/20 12:42 PM  Result Value Ref Range   Lipase 27 11 - 51 U/L    Comment: Performed at [facility], [place] [address], [place], [place] [address]  Urinalysis, Routine w reflex microscopic     Status: Abnormal   Collection Time: 11/11/20  4:54 PM  Result Value Ref Range   Color, Urine YELLOW YELLOW   APPearance CLEAR CLEAR   Specific Gravity, Urine 1.026 1.005 - 1.030   pH 5.0 5.0 - 8.0   Glucose, UA NEGATIVE NEGATIVE mg/dL   Hgb urine dipstick NEGATIVE NEGATIVE   Bilirubin Urine NEGATIVE NEGATIVE   Ketones, ur 20 (A) NEGATIVE mg/dL   Protein, ur NEGATIVE NEGATIVE mg/dL   Nitrite NEGATIVE NEGATIVE   Leukocytes,Ua NEGATIVE NEGATIVE    Comment: Performed at [place] [address] [place], [place] [address]  SARS CORONAVIRUS 2 (TAT 6-24 HRS) Nasopharyngeal Nasopharyngeal Swab     Status: Abnormal   Collection Time: 11/11/20  9:02 PM   Specimen: Nasopharyngeal Swab  Result Value Ref Range   SARS Coronavirus 2 POSITIVE (A) NEGATIVE    Comment: (NOTE) SARS-CoV-2 target nucleic acids are DETECTED.  The SARS-CoV-2 RNA is generally detectable in upper and  lower respiratory specimens during the acute phase of infection. Positive results are indicative of the presence of SARS-CoV-2 RNA. Clinical correlation with patient history and other diagnostic information is  necessary to determine patient infection status. Positive results do not rule out bacterial infection or co-infection with other viruses.  The expected result is Negative.  Fact Sheet for Patients: SugarRoll.be  Fact Sheet for Healthcare Providers: https://www.woods-mathews.com/  This test is not yet approved or cleared by the [place] FDA and  has been authorized for detection and/or diagnosis of SARS-CoV-2 by FDA under an Emergency Use Authorization (EUA). This EUA will remain  in effect (meaning this test can be used) for the duration of the COVID-19 declaration under Section 564(b)(1) of the Act, 21 U. S.C. section 360bbb-3(b)(1), unless the authorization is terminated or revoked sooner.   Performed at [facility], [place] [address] [place], [place] [address]   HIV Antibody (routine testing w rflx)     Status: None   Collection Time: 11/11/20  9:02 PM  Result Value Ref Range   HIV Screen 4th Generation wRfx Non Reactive Non Reactive    Comment: Performed at [facility], [place] [address], [place], [place] [id number]  Basic metabolic panel     Status: Abnormal   Collection Time: 11/12/20  4:54 AM  Result Value Ref Range   Sodium 134 (L) 135 - 145 mmol/L   Potassium 4.4 3.5 - 5.1 mmol/L   Chloride 103 98 - 111 mmol/L   CO2 22 22 - 32 mmol/L   Glucose, Bld 69 (L) 70 - 99 mg/dL    Comment: Glucose reference range applies only to samples taken after fasting for at least 8 hours.   BUN 18 6 - 20 mg/dL   Creatinine, Ser 0.97 0.61 - 1.24 mg/dL   Calcium 8.7 (L) 8.9 - 10.3 mg/dL   GFR, Estimated >60 >  60 mL/min    Comment: (NOTE) Calculated using the CKD-EPI Creatinine Equation (2021)    Anion gap 9 5 - 15     Comment: Performed at [facility], [place] [address] [place], [place] [address]  Magnesium     Status: None   Collection Time: 11/12/20  4:54 AM  Result Value Ref Range   Magnesium 2.0 1.7 - 2.4 mg/dL    Comment: Performed at [facility], [place] [address], [place], [place] [address]  Glucose, capillary     Status: None   Collection Time: 11/12/20  7:14 AM  Result Value Ref Range   Glucose-Capillary 77 70 - 99 mg/dL    Comment: Glucose reference range applies only to samples taken after fasting for at least 8 hours.   US SOFT TISSUE HEAD & NECK (NON-THYROID)  Result Date: 11/11/2020 CLINICAL DATA:  Subcutaneous mass on the right side of neck and on the left cheek for several years. EXAM: ULTRASOUND OF HEAD/NECK SOFT TISSUES TECHNIQUE: Ultrasound examination of the head and neck soft tissues was performed in the area of clinical concern. COMPARISON:  None. FINDINGS: Heterogeneous circumscribed ovoid nodular soft tissue attenuation lesions are demonstrated correlating to the palpable lesions in the left cheek and right lateral neck. The right neck lesion measures 1.6 x 0.8 x 1.5 cm. The left cheek lesion measures 1.8 x 0.6 x 2 cm. The appearance is nonspecific and could represent enlarged lymph nodes although typical lymph node architecture is not present suggesting abnormal lymph nodes. Other solid soft tissue mass or neoplasm could also have this appearance. Consider contrast-enhanced CT or MRI for further evaluation. IMPRESSION: Ovoid solid nodules demonstrated in the left cheek and right neck corresponding to palpable lesions. Appearances are nonspecific and abnormal lymph node versus soft tissue mass is possible. Consider contrast-enhanced CT or MRI for further evaluation. Electronically Signed   By: Lucienne Capers M.D.   On: 11/11/2020 21:22   CT ABDOMEN PELVIS W CONTRAST  Result Date: 11/11/2020 CLINICAL DATA:  RIGHT UPPER QUADRANT pain. History of rupture  ulcer. EXAM: CT ABDOMEN AND PELVIS WITH CONTRAST TECHNIQUE: Multidetector CT imaging of the abdomen and pelvis was performed using the standard protocol following bolus administration of intravenous contrast. CONTRAST:  81m OMNIPAQUE IOHEXOL 350 MG/ML SOLN COMPARISON:  12/09/2016 FINDINGS: Lower chest: Lung bases are unremarkable.  Heart size is normal. Hepatobiliary: Liver is mildly heterogeneous without suspicious lesion. There are tiny subcentimeter probable cysts noted. Gallbladder is present. Pancreas: Unremarkable. No pancreatic ductal dilatation or surrounding inflammatory changes. Spleen: Normal in size without focal abnormality. Adrenals/Urinary Tract: Adrenal glands are unremarkable. Kidneys are normal, without renal calculi, focal lesion, or hydronephrosis. Bladder is decompressed. Stomach/Bowel: Stomach has a normal appearance. There are dilated loops of small bowel primarily within the LEFT UPPER QUADRANT without definite transition zone. The more distal small bowel loops are normal in caliber. Colon is unremarkable. Evaluation of bowel is slightly limited by a lack of oral contrast. The appendix is normal. Vascular/Lymphatic: There is atherosclerotic calcification of the abdominal aorta, not associated with aneurysm. Although involved by atherosclerosis, there is vascular opacification of the celiac axis, superior mesenteric artery, and inferior mesenteric artery. Normal appearance of the portal venous system and inferior vena cava. Portal venous system is unremarkable. No retroperitoneal or mesenteric adenopathy. Reproductive: Prostate is unremarkable. Other: Small amount of ascites. No free intraperitoneal air. Abdominal wall is unremarkable. Musculoskeletal: There are degenerative changes in the lumbar spine. IMPRESSION: 1. Mildly dilated loops of small  bowel primarily within the LEFT UPPER QUADRANT, without definite transition zone. The findings favor ileus over bowel obstruction. 2. Normal  appendix. 3. Small amount of ascites. Electronically Signed   By: Nolon Nations M.D.   On: 11/11/2020 17:05   US Abdomen Limited  Result Date: 11/11/2020 CLINICAL DATA:  Right upper quadrant pain. EXAM: ULTRASOUND ABDOMEN LIMITED RIGHT UPPER QUADRANT COMPARISON:  Current CT of the abdomen and pelvis. FINDINGS: Gallbladder: Distended gallbladder. Mild wall irregularity, without wall thickening. Small dependent echogenic foci suggested possibly stones or small polyps. No pericholecystic fluid. No sonographic Murphy's sign. Common bile duct: Diameter: 6 mm Liver: No focal lesion identified. Within normal limits in parenchymal echogenicity. Portal vein is patent on color Doppler imaging with normal direction of blood flow towards the liver. Other: Small amount of ascites. IMPRESSION: 1. No acute findings. 2. Distended gallbladder. Wall appears slightly irregular but is not thickened. Small dependent echogenic foci may reflect stones or small polyps. 3. Common bile duct top normal in caliber.  Liver unremarkable. Electronically Signed   By: Lajean Manes M.D.   On: 11/11/2020 19:00    dextrose 5 % and 0.9% NaCl 125 mL/hr at 11/12/20 [id number]     Assessment/Plan  Abdominal pain Possible cholelithiasis/polyps Hx perforated pyloric ulcer; exploratory laparotomy, [facility] Heal patch repair perforated ulcer 12/09/2017 Dr. Stark Klein  -Normal LFTs/WBC  FEN: N.p.o./IV fluids ID: None DVT: SCDs  COVID-positive Hx tobacco/marijuana use Hx EtOH use  Plan: We will get a HIDA scan on him today.  I am repeating his LFTs and lipase.  I doubt that this is gallbladder related.  Would recommend GI evaluation if HIDA scan is negative.  Earnstine Regal [facility] Surgery 11/12/2020, 7:57 AM Please see Amion for pager number during day hours 7:00am-4:30pm

## 2020-11-12 NOTE — Progress Notes (Signed)
Initial Nutrition Assessment  INTERVENTION:   Once diet advanced: -Ensure Plus PO BID, each provides 350 kcals and 13g protein  NUTRITION DIAGNOSIS:   Increased nutrient needs related to acute illness as evidenced by estimated needs.  GOAL:   Patient will meet greater than or equal to 90% of their needs  MONITOR:   PO intake, Supplement acceptance, Labs, Weight trends, I & O's, Diet advancement  REASON FOR ASSESSMENT:   Malnutrition Screening Tool    ASSESSMENT:   55 y.o. male with medical history significant of emergency exploratory laparotomy and [facility] patch repair of a perforated prepyloric ulcer in September 2019, anemia, gallstones, hemothorax, marijuana use, tobacco use presented to the ED complaining of nausea and right upper quadrant abdominal pain.  Patient transferring to 4W given current COVID-19+ status.  Pt has been having abdominal pain and has not had a BM for 5 days now. Currently NPO for HIDA scan today. Surgery following. Once diet advanced ,recommend protein supplements.  Per weight records, no significant wt loss.  Medications: D5 infusion  Labs reviewed: CBGs: 77-113 Low Na UDS+ opiates, cocaine, THC   NUTRITION - FOCUSED PHYSICAL EXAM:  Unable to complete  Diet Order:   Diet Order             Diet NPO time specified  Diet effective now                   EDUCATION NEEDS:   No education needs have been identified at this time  Skin:  Skin Assessment: Reviewed RN Assessment  Last BM:  8/18  Height:   Ht Readings from Last 1 Encounters:  11/11/20 '5\' 9"'$  (1.753 m)    Weight:   Wt Readings from Last 1 Encounters:  11/11/20 68 kg    BMI:  Body mass index is 22.15 kg/m.  Estimated Nutritional Needs:   Kcal:  [id number]  Protein:  80-95g  Fluid:  2L/day  Marvin Bibles, MS, RD, LDN Inpatient Clinical Dietitian Contact information available via Amion

## 2020-11-13 ENCOUNTER — Inpatient Hospital Stay (HOSPITAL_COMMUNITY): Payer: Self-pay

## 2020-11-13 LAB — CBC
HCT: 34.4 % — ABNORMAL LOW (ref 39.0–52.0)
Hemoglobin: 10.9 g/dL — ABNORMAL LOW (ref 13.0–17.0)
MCH: 25.2 pg — ABNORMAL LOW (ref 26.0–34.0)
MCHC: 31.7 g/dL (ref 30.0–36.0)
MCV: 79.4 fL — ABNORMAL LOW (ref 80.0–100.0)
Platelets: 216 10*3/uL (ref 150–400)
RBC: 4.33 MIL/uL (ref 4.22–5.81)
RDW: 13.2 % (ref 11.5–15.5)
WBC: 4.8 10*3/uL (ref 4.0–10.5)
nRBC: 0 % (ref 0.0–0.2)

## 2020-11-13 LAB — COMPREHENSIVE METABOLIC PANEL
ALT: 10 U/L (ref 0–44)
AST: 12 U/L — ABNORMAL LOW (ref 15–41)
Albumin: 2.9 g/dL — ABNORMAL LOW (ref 3.5–5.0)
Alkaline Phosphatase: 56 U/L (ref 38–126)
Anion gap: 3 — ABNORMAL LOW (ref 5–15)
BUN: 16 mg/dL (ref 6–20)
CO2: 24 mmol/L (ref 22–32)
Calcium: 8 mg/dL — ABNORMAL LOW (ref 8.9–10.3)
Chloride: 107 mmol/L (ref 98–111)
Creatinine, Ser: 0.96 mg/dL (ref 0.61–1.24)
GFR, Estimated: >60 mL/min (ref >60)
Glucose, Bld: 95 mg/dL (ref 70–99)
Potassium: 3.7 mmol/L (ref 3.5–5.1)
Sodium: 134 mmol/L — ABNORMAL LOW (ref 135–145)
Total Bilirubin: 0.5 mg/dL (ref 0.3–1.2)
Total Protein: 5.3 g/dL — ABNORMAL LOW (ref 6.5–8.1)

## 2020-11-13 LAB — GLUCOSE, CAPILLARY
Glucose-Capillary: 74 mg/dL (ref 70–99)
Glucose-Capillary: 85 mg/dL (ref 70–99)
Glucose-Capillary: 130 mg/dL — ABNORMAL HIGH (ref 70–99)

## 2020-11-13 LAB — C-REACTIVE PROTEIN: CRP: 0.5 mg/dL (ref <1.0)

## 2020-11-13 LAB — LIPASE, BLOOD: Lipase: 32 U/L (ref 11–51)

## 2020-11-13 MED ORDER — TECHNETIUM TC 99M MEBROFENIN IV KIT
5.2000 | PACK | Freq: Once | INTRAVENOUS | Status: AC
  Administered 2020-11-13: 5.3 via INTRAVENOUS

## 2020-11-13 MED ORDER — POLYETHYLENE GLYCOL 3350 17 G PO PACK: 17.0000 g | PACK | Freq: Every day | ORAL | Status: DC | PRN

## 2020-11-13 MED ORDER — SENNOSIDES-DOCUSATE SODIUM 8.6-50 MG PO TABS: 1.0000 | ORAL_TABLET | Freq: Every day | ORAL | Status: DC

## 2020-11-13 NOTE — Progress Notes (Signed)
0755- Pt lying in bed in nad. Pt is alert and oriented x4. Breath sounds diminished bilaterally. Abdomen soft and nontender. Bowel sounds hypoactive x4 quadrants. Discussed plan of care and pt verbalized understanding. Call bell within reach.  0945- Taken to radiology dept. For HIDA scan via hospital bed by transport personnel. Pt in nad and making light banter.  1130- Pt back to room and demanding food. Dr. Maren Beach in to see pt and ordered clear liquid diet. Spite soft drink purchased for pt by this nurse per pt request.   1215- Clear liquid meal tray ordered for pt.  1300- Meal tray present. Pt c/o items on meal tray and states he is going home. Plan of care once again discussed with pt. Pt loud and argumentative.  62- Pt has pulled out #20g IV in RAC and states he is going home.   1544- AMA paper signed by pt and signature witnessed by this nurse. Pt escorted to front entrance to meet family member by staff member Ron.

## 2020-11-13 NOTE — Discharge Summary (Signed)
AMA Discharge Summary  Patient at this time expressed desire to leave the Hospital immidiately, patient has been warned that this is not Medically advisable at this time, and can result in Medical complications like Death and Disability, patient understands and accepts the risks involved and assumes full responsibilty of this decision.  Rn and myself made my best effort to convince him to stay.  Antonieta Pert M.D on 11/13/2020 at 4:10 PM  [facility]  Time < 30 minutes  Last Note Below   PROGRESS NOTE    Marvin Thomas  [id number] DOB: 04/23/1965 DOA: 11/11/2020 PCP: Patient, No Pcp Per (Inactive)   Chief Complaint  Patient presents with   Abdominal Pain  Brief Narrative: 55 year old male with significant history of emergency 6 left and Graham patch repair of perforated prepyloric ulcer in September 2019, anemia, gallstones, hemothorax, marijuana use, tobacco use presented to the ED with right upper quadrant abdominal pain x1 week with nausea.  In ED- Labs showing WBC 7.4, hemoglobin 12.4 (stable), platelet count 265k.  Sodium 136, potassium 3.9, chloride 105, bicarb 24, BUN 16, creatinine 0.8, glucose 84.  Lipase and LFTs normal.  UA without signs of infection.  Screening COVID test pending.  CT abdomen pelvis showing mildly dilated loops of small bowel primarily within the left upper quadrant without definite transition zone; findings favor ileus over bowel obstruction.  Right upper quadrant ultrasound showing distended gallbladder with slight wall irregularity but no thickening, small dependent echogenic foci may reflect stones or small polyps.  Also CBD normal in caliber, liver unremarkable Seen by surgery and ordered HIDA scan.  Subjective: Feels better still no bowel movement otherwise no nausea vomiting Hungry and wants to eat.     Assessment & Plan:  Right upper quadrant abdominal pain/possible cholelithiasis/polyps: Previous history of perforated prepyloric ulcer in September 2019.  Currently no leukocytosis lipase and LFTs are normal right upper quadrant ultrasound showing distended gallbladder with slight wall irregularity but no thickening, CT abdomen pelvis shows mildly dilated loops of small bowel primarily in the left upper quadrant without definite transition point.  Surgery has been consulted waiting on HIDA scan.  Symptoms have resolved less likely gallbladder issue if HIDA scan is negative surgery has advised GI evaluation given his previous history.  HIDA came back negative, informed pt that he needs Gi to see him.  COVID-19 positive on swab, incidental finding ,no respiratory symptoms.  Inflammatory markers are normal.  No need for treatment.  Recent Labs    11/12/20 0958 11/13/20 0358  DDIMER 0.35  --   FERRITIN 37  --   LDH 123  --   CRP <0.5 0.5   Hypoglycemia 69 -resolved.  Continue gentle fluids.    Subcutaneous skin masses present for 4 years or longer/?  Lipomas-ultrasound shows nonspecific and abnormal lymph node versus soft tissue mass to consider contrast-enhanced CT or MRI for further evaluation   Anemia likely from chronic disease.  Some drop overnight suspect due to dilution from IV fluids.  No obvious bleeding Recent Labs  Lab 11/11/20 1242 11/13/20 0358  HGB 12.4* 10.9*  HCT 40.0 34.4*    PUD history- cont PPI.  Tobacco  use/Marijuana use: Cessation was counseled on admission. Diet Order             Diet clear liquid Room service appropriate? Yes; Fluid consistency: Thin  Diet effective now                   Patient's Body mass index is 22.15 kg/m. DVT prophylaxis:  Code Status:   Code Status: Full Code  Family Communication: plan of care discussed with patient at bedside.  Status is: inpatient Patient remains hospitalized for ongoing management of  abdomen  pain Dispo: The patient is from: Home              Anticipated d/c is to: Home              Patient currently is not medically stable to d/c.   Difficult to place patient No  Unresulted Labs (From admission, onward)     Start     Ordered   11/13/20 0500  C-reactive protein  Daily,   R     Question:  Specimen collection method  Answer:  Lab=Lab collect   11/12/20 0935           Medications reviewed:  Scheduled Meds:  albuterol  2 puff Inhalation Q6H   mouth rinse  15 mL Mouth Rinse BID   nicotine  21 mg Transdermal Daily   pantoprazole (PROTONIX) IV  40 mg Intravenous Q12H   senna-docusate  1 tablet Oral QHS   Continuous Infusions:  dextrose 5 % and 0.9% NaCl 125 mL/hr at 11/13/20 0810   Consultants:see note  Procedures:see note Antimicrobials: Anti-infectives (From admission, onward)    None      Culture/Microbiology    Component Value Date/Time   SDES URINE, CLEAN CATCH 11/08/2012 0741   SPECREQUEST NONE 11/08/2012 0741   CULT NO GROWTH Performed at [facility] 11/08/2012 0741   REPTSTATUS 11/09/2012 FINAL 11/08/2012 0741    Other culture-see note  Objective: Vitals: Today's Vitals   11/13/20 0115 11/13/20 0141 11/13/20 0558 11/13/20 1310  BP: 135/77  120/77 (!) 143/75  Pulse: (!) 52  (!) 51 (!) 51  Resp: '16  13 16  '$ Temp: 98 F (36.7 C)  98.4 F (36.9 C) 98.1 F (36.7 C)  TempSrc: Oral  Oral Oral  SpO2: 100%  99% 99%  Weight:      Height:      PainSc:  Asleep      Intake/Output Summary (Last 24 hours) at 11/13/2020 1610 Last data filed at 11/13/2020 1519 Gross per 24 hour  Intake 1392.19 ml  Output 450 ml  Net 942.19 ml   Filed Weights   11/11/20 1224  Weight: 68 kg   Weight change:   Intake/Output from previous day: 08/22 0701 - 08/23 0700 In: 552.2 [I.V.:552.2] Out: 550 [Urine:550] Intake/Output this shift: Total I/O In: 840 [P.O.:840] Out: 200 [Urine:200] Filed Weights   11/11/20 1224  Weight: 68 kg    Examination: General exam: AAOx 3 older than stated age, weak appearing. HEENT:Oral mucosa moist, Ear/Nose WNL grossly, dentition normal. Respiratory system: bilaterally diminished,  no use of accessory muscle Cardiovascular system: S1 & S2 +, No JVD,. Gastrointestinal system: Abdomen soft, NT,ND, BS+ Nervous System:Alert, awake, moving extremities and grossly nonfocal Extremities: no edema, distal peripheral pulses palpable.  Skin: No rashes,no icterus. MSK: Normal muscle bulk,tone, power  Data Reviewed: I have personally reviewed following labs and imaging studies CBC: Recent Labs  Lab 11/11/20 1242 11/13/20 0358  WBC 7.4  4.8  NEUTROABS 5.5  --   HGB 12.4* 10.9*  HCT 40.0 34.4*  MCV 82.1 79.4*  PLT 265 [id number]   Basic Metabolic Panel: Recent Labs  Lab 11/11/20 1242 11/12/20 0454 11/13/20 0358  NA 136 134* 134*  K 3.9 4.4 3.7  CL 105 103 107  CO2 '24 22 24  '$ GLUCOSE 84 69* 95  BUN '16 18 16  '$ CREATININE 0.88 0.97 0.96  CALCIUM 9.0 8.7* 8.0*  MG  --  2.0  --    GFR: Estimated Creatinine Clearance: 83.6 mL/min (by C-G formula based on SCr of 0.96 mg/dL). Liver Function Tests: Recent Labs  Lab 11/11/20 1242 11/12/20 0454 11/13/20 0358  AST 17 22 12*  ALT '13 12 10  '$ ALKPHOS 80 80 56  BILITOT 0.8 0.6 0.5  PROT 6.8 7.0 5.3*  ALBUMIN 3.9 3.7 2.9*   Recent Labs  Lab 11/11/20 1242 11/13/20 0358  LIPASE 27 32   No results for input(s): AMMONIA in the last 168 hours. Coagulation Profile: No results for input(s): INR, PROTIME in the last 168 hours. Cardiac Enzymes: No results for input(s): CKTOTAL, CKMB, CKMBINDEX, TROPONINI in the last 168 hours. BNP (last 3 results) No results for input(s): PROBNP in the last 8760 hours. HbA1C: No results for input(s): HGBA1C in the last 72 hours. CBG: Recent Labs  Lab 11/12/20 0935 11/12/20 1706 11/13/20 0116 11/13/20 0601 11/13/20 1450  GLUCAP 113* 76 74 85 130*   Lipid Profile: No results for input(s): CHOL, HDL,  LDLCALC, TRIG, CHOLHDL, LDLDIRECT in the last 72 hours. Thyroid Function Tests: No results for input(s): TSH, T4TOTAL, FREET4, T3FREE, THYROIDAB in the last 72 hours. Anemia Panel: Recent Labs    11/12/20 0958  FERRITIN 37   Sepsis Labs: Recent Labs  Lab 11/12/20 0958  PROCALCITON <0.10    Recent Results (from the past 240 hour(s))  SARS CORONAVIRUS 2 (TAT 6-24 HRS) Nasopharyngeal Nasopharyngeal Swab     Status: Abnormal   Collection Time: 11/11/20  9:02 PM   Specimen: Nasopharyngeal Swab  Result Value Ref Range Status   SARS Coronavirus 2 POSITIVE (A) NEGATIVE Final    Comment: (NOTE) SARS-CoV-2 target nucleic acids are DETECTED.  The SARS-CoV-2 RNA is generally detectable in upper and lower respiratory specimens during the acute phase of infection. Positive results are indicative of the presence of SARS-CoV-2 RNA. Clinical correlation with patient history and other diagnostic information is  necessary to determine patient infection status. Positive results do not rule out bacterial infection or co-infection with other viruses.  The expected result is Negative.  Fact Sheet for Patients: SugarRoll.be  Fact Sheet for Healthcare Providers: https://www.woods-mathews.com/  This test is not yet approved or cleared by the [place] FDA and  has been authorized for detection and/or diagnosis of SARS-CoV-2 by FDA under an Emergency Use Authorization (EUA). This EUA will remain  in effect (meaning this test can be used) for the duration of the COVID-19 declaration under Section 564(b)(1) of the Act, 21 U. S.C. section 360bbb-3(b)(1), unless the authorization is terminated or revoked sooner.   Performed at [facility], [place] [address] [place], [place] [address]      Radiology Studies: NM Hepatobiliary Liver Func  Result Date: 11/13/2020 CLINICAL DATA:  Right upper quadrant pain for 2 weeks. EXAM: NUCLEAR MEDICINE  HEPATOBILIARY IMAGING TECHNIQUE: Sequential images of the abdomen were obtained out to 60 minutes following intravenous administration of radiopharmaceutical. RADIOPHARMACEUTICALS:  5.3 mCi Tc-83m Choletec IV COMPARISON:  None. FINDINGS:  Prompt uptake and biliary excretion of activity by the liver is seen. Gallbladder activity is visualized, consistent with patency of cystic duct. Biliary activity passes into small bowel, consistent with patent common bile duct. IMPRESSION: Normal hepatobiliary scan, which demonstrates patency of cystic and common bile ducts. Electronically Signed   By: Marlaine Hind M.D.   On: 11/13/2020 11:55   US SOFT TISSUE HEAD & NECK (NON-THYROID)  Result Date: 11/11/2020 CLINICAL DATA:  Subcutaneous mass on the right side of neck and on the left cheek for several years. EXAM: ULTRASOUND OF HEAD/NECK SOFT TISSUES TECHNIQUE: Ultrasound examination of the head and neck soft tissues was performed in the area of clinical concern. COMPARISON:  None. FINDINGS: Heterogeneous circumscribed ovoid nodular soft tissue attenuation lesions are demonstrated correlating to the palpable lesions in the left cheek and right lateral neck. The right neck lesion measures 1.6 x 0.8 x 1.5 cm. The left cheek lesion measures 1.8 x 0.6 x 2 cm. The appearance is nonspecific and could represent enlarged lymph nodes although typical lymph node architecture is not present suggesting abnormal lymph nodes. Other solid soft tissue mass or neoplasm could also have this appearance. Consider contrast-enhanced CT or MRI for further evaluation. IMPRESSION: Ovoid solid nodules demonstrated in the left cheek and right neck corresponding to palpable lesions. Appearances are nonspecific and abnormal lymph node versus soft tissue mass is possible. Consider contrast-enhanced CT or MRI for further evaluation. Electronically Signed   By: Lucienne Capers M.D.   On: 11/11/2020 21:22   CT ABDOMEN PELVIS W CONTRAST  Result Date:  11/11/2020 CLINICAL DATA:  RIGHT UPPER QUADRANT pain. History of rupture ulcer. EXAM: CT ABDOMEN AND PELVIS WITH CONTRAST TECHNIQUE: Multidetector CT imaging of the abdomen and pelvis was performed using the standard protocol following bolus administration of intravenous contrast. CONTRAST:  47m OMNIPAQUE IOHEXOL 350 MG/ML SOLN COMPARISON:  12/09/2016 FINDINGS: Lower chest: Lung bases are unremarkable.  Heart size is normal. Hepatobiliary: Liver is mildly heterogeneous without suspicious lesion. There are tiny subcentimeter probable cysts noted. Gallbladder is present. Pancreas: Unremarkable. No pancreatic ductal dilatation or surrounding inflammatory changes. Spleen: Normal in size without focal abnormality. Adrenals/Urinary Tract: Adrenal glands are unremarkable. Kidneys are normal, without renal calculi, focal lesion, or hydronephrosis. Bladder is decompressed. Stomach/Bowel: Stomach has a normal appearance. There are dilated loops of small bowel primarily within the LEFT UPPER QUADRANT without definite transition zone. The more distal small bowel loops are normal in caliber. Colon is unremarkable. Evaluation of bowel is slightly limited by a lack of oral contrast. The appendix is normal. Vascular/Lymphatic: There is atherosclerotic calcification of the abdominal aorta, not associated with aneurysm. Although involved by atherosclerosis, there is vascular opacification of the celiac axis, superior mesenteric artery, and inferior mesenteric artery. Normal appearance of the portal venous system and inferior vena cava. Portal venous system is unremarkable. No retroperitoneal or mesenteric adenopathy. Reproductive: Prostate is unremarkable. Other: Small amount of ascites. No free intraperitoneal air. Abdominal wall is unremarkable. Musculoskeletal: There are degenerative changes in the lumbar spine. IMPRESSION: 1. Mildly dilated loops of small bowel primarily within the LEFT UPPER QUADRANT, without definite  transition zone. The findings favor ileus over bowel obstruction. 2. Normal appendix. 3. Small amount of ascites. Electronically Signed   By: ENolon NationsM.D.   On: 11/11/2020 17:05   [place]Abdomen Limited  Result Date: 11/11/2020 CLINICAL DATA:  Right upper quadrant pain. EXAM: ULTRASOUND ABDOMEN LIMITED RIGHT UPPER QUADRANT COMPARISON:  Current CT of the abdomen and pelvis. FINDINGS: Gallbladder:  Distended gallbladder. Mild wall irregularity, without wall thickening. Small dependent echogenic foci suggested possibly stones or small polyps. No pericholecystic fluid. No sonographic Murphy's sign. Common bile duct: Diameter: 6 mm Liver: No focal lesion identified. Within normal limits in parenchymal echogenicity. Portal vein is patent on color Doppler imaging with normal direction of blood flow towards the liver. Other: Small amount of ascites. IMPRESSION: 1. No acute findings. 2. Distended gallbladder. Wall appears slightly irregular but is not thickened. Small dependent echogenic foci may reflect stones or small polyps. 3. Common bile duct top normal in caliber.  Liver unremarkable. Electronically Signed   By: Lajean Manes M.D.   On: 11/11/2020 19:00     LOS: 1 day   Antonieta Pert, MD [facility]  11/13/2020, 4:10 PM

## 2020-11-13 NOTE — Progress Notes (Signed)
Progress Note     Subjective: Feeling much better this am. He denies nausea, emesis, abdominal pain. Still no BM (~6 days since last BM) but passing flatus. He is NPO  Objective: Vital signs in last 24 hours: Temp:  [98 F (36.7 C)-98.4 F (36.9 C)] 98.4 F (36.9 C) (08/23 0558) Pulse Rate:  [51-56] 51 (08/23 0558) Resp:  [13-20] 13 (08/23 0558) BP: (120-165)/(70-82) 120/77 (08/23 0558) SpO2:  [97 %-100 %] 99 % (08/23 0558) Last BM Date: 11/08/20  Intake/Output from previous day: 08/22 0701 - 08/23 0700 In: 552.2 [I.V.:552.2] Out: 550 [Urine:550] Intake/Output this shift: No intake/output data recorded.  PE: General: pleasant, WD, male who is laying in bed in NAD HEENT: head is normocephalic, atraumatic.  Mouth is pink and moist Heart: Palpable radial and pedal pulses bilaterally Lungs: Respiratory effort nonlabored Abd: soft, NT, ND, +BS, no masses, hernias, or organomegaly MSK: all 4 extremities are symmetrical with no cyanosis, clubbing, or edema. No calf TTP bilaterally Skin: warm and dry with no masses, lesions, or rashes Psych: A&Ox3 with an appropriate affect.    Lab Results:  Recent Labs    11/11/20 1242 11/13/20 0358  WBC 7.4 4.8  HGB 12.4* 10.9*  HCT 40.0 34.4*  PLT 265 216   BMET Recent Labs    11/12/20 0454 11/13/20 0358  NA 134* 134*  K 4.4 3.7  CL 103 107  CO2 22 24  GLUCOSE 69* 95  BUN 18 16  CREATININE 0.97 0.96  CALCIUM 8.7* 8.0*   PT/INR No results for input(s): LABPROT, INR in the last 72 hours. CMP     Component Value Date/Time   NA 134 (L) 11/13/2020 0358   K 3.7 11/13/2020 0358   CL 107 11/13/2020 0358   CO2 24 11/13/2020 0358   GLUCOSE 95 11/13/2020 0358   BUN 16 11/13/2020 0358   CREATININE 0.96 11/13/2020 0358   CREATININE 0.84 01/30/2014 1020   CALCIUM 8.0 (L) 11/13/2020 0358   PROT 5.3 (L) 11/13/2020 0358   ALBUMIN 2.9 (L) 11/13/2020 0358   AST 12 (L) 11/13/2020 0358   ALT 10 11/13/2020 0358   ALKPHOS 56  11/13/2020 0358   BILITOT 0.5 11/13/2020 0358   GFRNONAA >60 11/13/2020 0358   GFRNONAA >89 01/30/2014 1020   GFRAA >60 12/13/2017 0442   GFRAA >89 01/30/2014 1020   Lipase     Component Value Date/Time   LIPASE 32 11/13/2020 0358       Studies/Results: US SOFT TISSUE HEAD & NECK (NON-THYROID)  Result Date: 11/11/2020 CLINICAL DATA:  Subcutaneous mass on the right side of neck and on the left cheek for several years. EXAM: ULTRASOUND OF HEAD/NECK SOFT TISSUES TECHNIQUE: Ultrasound examination of the head and neck soft tissues was performed in the area of clinical concern. COMPARISON:  None. FINDINGS: Heterogeneous circumscribed ovoid nodular soft tissue attenuation lesions are demonstrated correlating to the palpable lesions in the left cheek and right lateral neck. The right neck lesion measures 1.6 x 0.8 x 1.5 cm. The left cheek lesion measures 1.8 x 0.6 x 2 cm. The appearance is nonspecific and could represent enlarged lymph nodes although typical lymph node architecture is not present suggesting abnormal lymph nodes. Other solid soft tissue mass or neoplasm could also have this appearance. Consider contrast-enhanced CT or MRI for further evaluation. IMPRESSION: Ovoid solid nodules demonstrated in the left cheek and right neck corresponding to palpable lesions. Appearances are nonspecific and abnormal lymph node versus soft tissue mass is  possible. Consider contrast-enhanced CT or MRI for further evaluation. Electronically Signed   By: Lucienne Capers M.D.   On: 11/11/2020 21:22   CT ABDOMEN PELVIS W CONTRAST  Result Date: 11/11/2020 CLINICAL DATA:  RIGHT UPPER QUADRANT pain. History of rupture ulcer. EXAM: CT ABDOMEN AND PELVIS WITH CONTRAST TECHNIQUE: Multidetector CT imaging of the abdomen and pelvis was performed using the standard protocol following bolus administration of intravenous contrast. CONTRAST:  21m OMNIPAQUE IOHEXOL 350 MG/ML SOLN COMPARISON:  12/09/2016 FINDINGS: Lower  chest: Lung bases are unremarkable.  Heart size is normal. Hepatobiliary: Liver is mildly heterogeneous without suspicious lesion. There are tiny subcentimeter probable cysts noted. Gallbladder is present. Pancreas: Unremarkable. No pancreatic ductal dilatation or surrounding inflammatory changes. Spleen: Normal in size without focal abnormality. Adrenals/Urinary Tract: Adrenal glands are unremarkable. Kidneys are normal, without renal calculi, focal lesion, or hydronephrosis. Bladder is decompressed. Stomach/Bowel: Stomach has a normal appearance. There are dilated loops of small bowel primarily within the LEFT UPPER QUADRANT without definite transition zone. The more distal small bowel loops are normal in caliber. Colon is unremarkable. Evaluation of bowel is slightly limited by a lack of oral contrast. The appendix is normal. Vascular/Lymphatic: There is atherosclerotic calcification of the abdominal aorta, not associated with aneurysm. Although involved by atherosclerosis, there is vascular opacification of the celiac axis, superior mesenteric artery, and inferior mesenteric artery. Normal appearance of the portal venous system and inferior vena cava. Portal venous system is unremarkable. No retroperitoneal or mesenteric adenopathy. Reproductive: Prostate is unremarkable. Other: Small amount of ascites. No free intraperitoneal air. Abdominal wall is unremarkable. Musculoskeletal: There are degenerative changes in the lumbar spine. IMPRESSION: 1. Mildly dilated loops of small bowel primarily within the LEFT UPPER QUADRANT, without definite transition zone. The findings favor ileus over bowel obstruction. 2. Normal appendix. 3. Small amount of ascites. Electronically Signed   By: ENolon NationsM.D.   On: 11/11/2020 17:05   [place]domen Limited  Result Date: 11/11/2020 CLINICAL DATA:  Right upper quadrant pain. EXAM: ULTRASOUND ABDOMEN LIMITED RIGHT UPPER QUADRANT COMPARISON:  Current CT of the abdomen and  pelvis. FINDINGS: Gallbladder: Distended gallbladder. Mild wall irregularity, without wall thickening. Small dependent echogenic foci suggested possibly stones or small polyps. No pericholecystic fluid. No sonographic Murphy's sign. Common bile duct: Diameter: 6 mm Liver: No focal lesion identified. Within normal limits in parenchymal echogenicity. Portal vein is patent on color Doppler imaging with normal direction of blood flow towards the liver. Other: Small amount of ascites. IMPRESSION: 1. No acute findings. 2. Distended gallbladder. Wall appears slightly irregular but is not thickened. Small dependent echogenic foci may reflect stones or small polyps. 3. Common bile duct top normal in caliber.  Liver unremarkable. Electronically Signed   By: DLajean ManesM.D.   On: 11/11/2020 19:00    Anti-infectives: Anti-infectives (From admission, onward)    None        Assessment/Plan  Abdominal pain Possible cholelithiasis/polyps Hx perforated pyloric ulcer; exploratory laparotomy, Graham patch repair perforated ulcer 12/09/2017 Dr. FStark Klein- LFTs and WBC remain normal - symptoms have improved/resolved - he is eager to go home ASAP - continue protonix   FEN: N.p.o./IV fluids ID: None DVT: SCDs   COVID-positive Hx tobacco/marijuana use Hx EtOH use   Plan: Await HIDA scan. Repeat labs stable.  I doubt that this is gallbladder related.  Would recommend GI evaluation if HIDA scan is negative.    LOS: 1 day    MWinferd Humphrey PL[facility]  Surgery 11/13/2020, 8:22 AM Please see Amion for pager number during day hours 7:00am-4:30pm

## 2020-11-13 NOTE — Consult Note (Signed)
[facility] UNASSIGNED CONSULT  Reason for Consult: Abdominal pain Referring Physician: [facility]  Marvin Thomas HPI: This is a 55 year old male with a PMH of a perforated prepyloric ulcer s/p ex lap on 12/09/2017, small cholelithiasis versus gallbladder polyps, and anemia admitted for complaints of RUQ pain and nausea.  He complained about a 1 week history of RUQ pain described as being sharp and associated with nausea.  The symptoms do not appear to be associaated with PO intake, but his symptoms are sporadic.  Work up in the hospital for a biliary source was negative.  The patient denies having a prior EGD.  Past Medical History:  Diagnosis Date   Anemia Dx 2014   Gallstones    Hemothorax    Ulcer     Past Surgical History:  Procedure Laterality Date   LAPAROTOMY N/A 12/09/2017   Procedure: EXPLORATORY LAPAROTOMY, REPAIR PERFORATED PYLEORIC ULCER;  Surgeon: Stark Klein, MD;  Location: [facility];  Service: General;  Laterality: N/A;    Family History  Problem Relation Age of Onset   Diabetes Mother    Hypertension Mother    Heart disease Mother        x 3    Heart disease Brother    Hypertension Brother    Diabetes Other    Hypertension Brother    Hypertension Brother    Cancer Neg Hx     Social History:  reports that he has been smoking cigarettes. He has a 46.50 pack-year smoking history. He has never used smokeless tobacco. He reports current alcohol use. He reports current drug use. Drug: Marijuana.  Allergies:  Allergies  Allergen Reactions   Aspirin Nausea Only    Medications: Scheduled:  albuterol  2 puff Inhalation Q6H   mouth rinse  15 mL Mouth Rinse BID   nicotine  21 mg Transdermal Daily   pantoprazole (PROTONIX) IV  40 mg Intravenous Q12H   senna-docusate  1 tablet Oral QHS   Continuous:  dextrose 5 % and 0.9% NaCl 125 mL/hr at 11/13/20 0810    Results for orders placed or performed during the hospital encounter of 11/11/20 (from the past 24  hour(s))  Glucose, capillary     Status: None   Collection Time: 11/12/20  5:06 PM  Result Value Ref Range   Glucose-Capillary 76 70 - 99 mg/dL  Glucose, capillary     Status: None   Collection Time: 11/13/20  1:16 AM  Result Value Ref Range   Glucose-Capillary 74 70 - 99 mg/dL  Lipase, blood     Status: None   Collection Time: 11/13/20  3:58 AM  Result Value Ref Range   Lipase 32 11 - 51 U/L  CBC     Status: Abnormal   Collection Time: 11/13/20  3:58 AM  Result Value Ref Range   WBC 4.8 4.0 - 10.5 K/uL   RBC 4.33 4.22 - 5.81 MIL/uL   Hemoglobin 10.9 (L) 13.0 - 17.0 g/dL   HCT 34.4 (L) 39.0 - 52.0 %   MCV 79.4 (L) 80.0 - 100.0 fL   MCH 25.2 (L) 26.0 - 34.0 pg   MCHC 31.7 30.0 - 36.0 g/dL   RDW 13.2 11.5 - 15.5 %   Platelets 216 150 - 400 K/uL   nRBC 0.0 0.0 - 0.2 %  Comprehensive metabolic panel     Status: Abnormal   Collection Time: 11/13/20  3:58 AM  Result Value Ref Range   Sodium 134 (L) 135 - 145  mmol/L   Potassium 3.7 3.5 - 5.1 mmol/L   Chloride 107 98 - 111 mmol/L   CO2 24 22 - 32 mmol/L   Glucose, Bld 95 70 - 99 mg/dL   BUN 16 6 - 20 mg/dL   Creatinine, Ser 0.96 0.61 - 1.24 mg/dL   Calcium 8.0 (L) 8.9 - 10.3 mg/dL   Total Protein 5.3 (L) 6.5 - 8.1 g/dL   Albumin 2.9 (L) 3.5 - 5.0 g/dL   AST 12 (L) 15 - 41 U/L   ALT 10 0 - 44 U/L   Alkaline Phosphatase 56 38 - 126 U/L   Total Bilirubin 0.5 0.3 - 1.2 mg/dL   GFR, Estimated >60 >60 mL/min   Anion gap 3 (L) 5 - 15  C-reactive protein     Status: None   Collection Time: 11/13/20  3:58 AM  Result Value Ref Range   CRP 0.5 <1.0 mg/dL  Glucose, capillary     Status: None   Collection Time: 11/13/20  6:01 AM  Result Value Ref Range   Glucose-Capillary 85 70 - 99 mg/dL     NM Hepatobiliary Liver Func  Result Date: 11/13/2020 CLINICAL DATA:  Right upper quadrant pain for 2 weeks. EXAM: NUCLEAR MEDICINE HEPATOBILIARY IMAGING TECHNIQUE: Sequential images of the abdomen were obtained out to 60 minutes following  intravenous administration of radiopharmaceutical. RADIOPHARMACEUTICALS:  5.3 mCi Tc-15m Choletec IV COMPARISON:  None. FINDINGS: Prompt uptake and biliary excretion of activity by the liver is seen. Gallbladder activity is visualized, consistent with patency of cystic duct. Biliary activity passes into small bowel, consistent with patent common bile duct. IMPRESSION: Normal hepatobiliary scan, which demonstrates patency of cystic and common bile ducts. Electronically Signed   By: JMarlaine HindM.D.   On: 11/13/2020 11:55   [place]oreaSOFT TISSUE HEAD & NECK (NON-THYROID)  Result Date: 11/11/2020 CLINICAL DATA:  Subcutaneous mass on the right side of neck and on the left cheek for several years. EXAM: ULTRASOUND OF HEAD/NECK SOFT TISSUES TECHNIQUE: Ultrasound examination of the head and neck soft tissues was performed in the area of clinical concern. COMPARISON:  None. FINDINGS: Heterogeneous circumscribed ovoid nodular soft tissue attenuation lesions are demonstrated correlating to the palpable lesions in the left cheek and right lateral neck. The right neck lesion measures 1.6 x 0.8 x 1.5 cm. The left cheek lesion measures 1.8 x 0.6 x 2 cm. The appearance is nonspecific and could represent enlarged lymph nodes although typical lymph node architecture is not present suggesting abnormal lymph nodes. Other solid soft tissue mass or neoplasm could also have this appearance. Consider contrast-enhanced CT or MRI for further evaluation. IMPRESSION: Ovoid solid nodules demonstrated in the left cheek and right neck corresponding to palpable lesions. Appearances are nonspecific and abnormal lymph node versus soft tissue mass is possible. Consider contrast-enhanced CT or MRI for further evaluation. Electronically Signed   By: WLucienne CapersM.D.   On: 11/11/2020 21:22   CT ABDOMEN PELVIS W CONTRAST  Result Date: 11/11/2020 CLINICAL DATA:  RIGHT UPPER QUADRANT pain. History of rupture ulcer. EXAM: CT ABDOMEN AND PELVIS WITH  CONTRAST TECHNIQUE: Multidetector CT imaging of the abdomen and pelvis was performed using the standard protocol following bolus administration of intravenous contrast. CONTRAST:  872mOMNIPAQUE IOHEXOL 350 MG/ML SOLN COMPARISON:  12/09/2016 FINDINGS: Lower chest: Lung bases are unremarkable.  Heart size is normal. Hepatobiliary: Liver is mildly heterogeneous without suspicious lesion. There are tiny subcentimeter probable cysts noted. Gallbladder is present. Pancreas: Unremarkable. No pancreatic  ductal dilatation or surrounding inflammatory changes. Spleen: Normal in size without focal abnormality. Adrenals/Urinary Tract: Adrenal glands are unremarkable. Kidneys are normal, without renal calculi, focal lesion, or hydronephrosis. Bladder is decompressed. Stomach/Bowel: Stomach has a normal appearance. There are dilated loops of small bowel primarily within the LEFT UPPER QUADRANT without definite transition zone. The more distal small bowel loops are normal in caliber. Colon is unremarkable. Evaluation of bowel is slightly limited by a lack of oral contrast. The appendix is normal. Vascular/Lymphatic: There is atherosclerotic calcification of the abdominal aorta, not associated with aneurysm. Although involved by atherosclerosis, there is vascular opacification of the celiac axis, superior mesenteric artery, and inferior mesenteric artery. Normal appearance of the portal venous system and inferior vena cava. Portal venous system is unremarkable. No retroperitoneal or mesenteric adenopathy. Reproductive: Prostate is unremarkable. Other: Small amount of ascites. No free intraperitoneal air. Abdominal wall is unremarkable. Musculoskeletal: There are degenerative changes in the lumbar spine. IMPRESSION: 1. Mildly dilated loops of small bowel primarily within the LEFT UPPER QUADRANT, without definite transition zone. The findings favor ileus over bowel obstruction. 2. Normal appendix. 3. Small amount of ascites.  Electronically Signed   By: Nolon Nations M.D.   On: 11/11/2020 17:05   US Abdomen Limited  Result Date: 11/11/2020 CLINICAL DATA:  Right upper quadrant pain. EXAM: ULTRASOUND ABDOMEN LIMITED RIGHT UPPER QUADRANT COMPARISON:  Current CT of the abdomen and pelvis. FINDINGS: Gallbladder: Distended gallbladder. Mild wall irregularity, without wall thickening. Small dependent echogenic foci suggested possibly stones or small polyps. No pericholecystic fluid. No sonographic Murphy's sign. Common bile duct: Diameter: 6 mm Liver: No focal lesion identified. Within normal limits in parenchymal echogenicity. Portal vein is patent on color Doppler imaging with normal direction of blood flow towards the liver. Other: Small amount of ascites. IMPRESSION: 1. No acute findings. 2. Distended gallbladder. Wall appears slightly irregular but is not thickened. Small dependent echogenic foci may reflect stones or small polyps. 3. Common bile duct top normal in caliber.  Liver unremarkable. Electronically Signed   By: Lajean Manes M.D.   On: 11/11/2020 19:00    ROS:  As stated above in the HPI otherwise negative.  Blood pressure (!) 143/75, pulse (!) 51, temperature 98.1 F (36.7 C), temperature source Oral, resp. rate 16, height '5\' 9"'$  (1.753 m), weight 68 kg, SpO2 99 %.    PE: Gen: NAD, Alert and Oriented HEENT:  [place]/AT, EOMI Neck: Supple, no LAD Lungs: CTA Bilaterally CV: RRR without M/G/R ABD: Soft, NTND, +BS Ext: No C/C/E  Assessment/Plan: 1) RUQ pain - resolved. 2) Anemia. 3) History of a perforated gastric ulcer. 4) Nausea.   The patient currently reports a resolution of his pain.  He feels well and he does not need any further work up.  Plan: 1) No GI intervention.  , D 11/13/2020, 2:24 PM

## 2020-11-13 NOTE — Progress Notes (Signed)
PROGRESS NOTE    Marvin Thomas  [id number] DOB: 09-13-1965 DOA: 11/11/2020 PCP: Patient, No Pcp Per (Inactive)   Chief Complaint  Patient presents with   Abdominal Pain  Brief Narrative: 55 year old male with significant history of emergency 6 left and Graham patch repair of perforated prepyloric ulcer in September 2019, anemia, gallstones, hemothorax, marijuana use, tobacco use presented to the ED with right upper quadrant abdominal pain x1 week with nausea.  In ED- Labs showing WBC 7.4, hemoglobin 12.4 (stable), platelet count 265k.  Sodium 136, potassium 3.9, chloride 105, bicarb 24, BUN 16, creatinine 0.8, glucose 84.  Lipase and LFTs normal.  UA without signs of infection.  Screening COVID test pending.  CT abdomen pelvis showing mildly dilated loops of small bowel primarily within the left upper quadrant without definite transition zone; findings favor ileus over bowel obstruction.  Right upper quadrant ultrasound showing distended gallbladder with slight wall irregularity but no thickening, small dependent echogenic foci may reflect stones or small polyps.  Also CBD normal in caliber, liver unremarkable Seen by surgery and ordered HIDA scan.  Subjective: Feels better still no bowel movement otherwise no nausea vomiting Hungry and wants to eat.    Assessment & Plan:  Right upper quadrant abdominal pain/possible cholelithiasis/polyps: Previous history of perforated prepyloric ulcer in September 2019.  Currently no leukocytosis lipase and LFTs are normal right upper quadrant ultrasound showing distended gallbladder with slight wall irregularity but no thickening, CT abdomen pelvis shows mildly dilated loops of small bowel primarily in the left upper quadrant without definite transition point.  Surgery has been consulted waiting on HIDA scan.  Symptoms have resolved less likely gallbladder issue if HIDA scan is negative surgery has advised GI evaluation given his previous history.   HIDA came back negative, informed pt that he needs Gi to see him.  COVID-19 positive on swab, incidental finding ,no respiratory symptoms.  Inflammatory markers are normal.  No need for treatment.  Recent Labs    11/12/20 0958 11/13/20 0358  DDIMER 0.35  --   FERRITIN 37  --   LDH 123  --   CRP <0.5 0.5    Hypoglycemia 69 -resolved.  Continue gentle fluids.    Subcutaneous skin masses present for 4 years or longer/?  Lipomas-ultrasound shows nonspecific and abnormal lymph node versus soft tissue mass to consider contrast-enhanced CT or MRI for further evaluation   Anemia likely from chronic disease.  Some drop overnight suspect due to dilution from IV fluids.  No obvious bleeding Recent Labs  Lab 11/11/20 1242 11/13/20 0358  HGB 12.4* 10.9*  HCT 40.0 34.4*     PUD history- cont PPI.  Tobacco use/Marijuana use: Cessation was counseled on admission. Diet Order             Diet NPO time specified  Diet effective now                   Patient's Body mass index is 22.15 kg/m. DVT prophylaxis: SCDs Start: 11/11/20 1958 Code Status:   Code Status: Full Code  Family Communication: plan of care discussed with patient at bedside.  Status is: inpatient Patient remains hospitalized for ongoing management of  abdomen pain Dispo: The patient is from: Home              Anticipated d/c is to: Home              Patient currently is not medically stable to d/c.   Difficult to  place patient No  Unresulted Labs (From admission, onward)     Start     Ordered   11/13/20 0500  C-reactive protein  Daily,   R     Question:  Specimen collection method  Answer:  Lab=Lab collect   11/12/20 0935           Medications reviewed:  Scheduled Meds:  albuterol  2 puff Inhalation Q6H   mouth rinse  15 mL Mouth Rinse BID   nicotine  21 mg Transdermal Daily   pantoprazole (PROTONIX) IV  40 mg Intravenous Q12H   senna-docusate  1 tablet Oral QHS   Continuous Infusions:  dextrose 5  % and 0.9% NaCl 125 mL/hr at 11/13/20 0810   Consultants:see note  Procedures:see note Antimicrobials: Anti-infectives (From admission, onward)    None      Culture/Microbiology    Component Value Date/Time   SDES URINE, CLEAN CATCH 11/08/2012 0741   SPECREQUEST NONE 11/08/2012 0741   CULT NO GROWTH Performed at [facility] 11/08/2012 0741   REPTSTATUS 11/09/2012 FINAL 11/08/2012 0741    Other culture-see note  Objective: Vitals: Today's Vitals   11/13/20 0111 11/13/20 0115 11/13/20 0141 11/13/20 0558  BP:  135/77  120/77  Pulse:  (!) 52  (!) 51  Resp:  16  13  Temp:  98 F (36.7 C)  98.4 F (36.9 C)  TempSrc:  Oral  Oral  SpO2:  100%  99%  Weight:      Height:      PainSc: 6   Asleep     Intake/Output Summary (Last 24 hours) at 11/13/2020 1137 Last data filed at 11/13/2020 0200 Gross per 24 hour  Intake 552.19 ml  Output 550 ml  Net 2.19 ml    Filed Weights   11/11/20 1224  Weight: 68 kg   Weight change:   Intake/Output from previous day: 08/22 0701 - 08/23 0700 In: 552.2 [I.V.:552.2] Out: 550 [Urine:550] Intake/Output this shift: No intake/output data recorded. Filed Weights   11/11/20 1224  Weight: 68 kg   Examination: General exam: AAOx 3 older than stated age, weak appearing. HEENT:Oral mucosa moist, Ear/Nose WNL grossly, dentition normal. Respiratory system: bilaterally diminished,  no use of accessory muscle Cardiovascular system: S1 & S2 +, No JVD,. Gastrointestinal system: Abdomen soft, NT,ND, BS+ Nervous System:Alert, awake, moving extremities and grossly nonfocal Extremities: no edema, distal peripheral pulses palpable.  Skin: No rashes,no icterus. MSK: Normal muscle bulk,tone, power  Data Reviewed: I have personally reviewed following labs and imaging studies CBC: Recent Labs  Lab 11/11/20 1242 11/13/20 0358  WBC 7.4 4.8  NEUTROABS 5.5  --   HGB 12.4* 10.9*  HCT 40.0 34.4*  MCV 82.1 79.4*  PLT 265 216    Basic  Metabolic Panel: Recent Labs  Lab 11/11/20 1242 11/12/20 0454 11/13/20 0358  NA 136 134* 134*  K 3.9 4.4 3.7  CL 105 103 107  CO2 '24 22 24  '$ GLUCOSE 84 69* 95  BUN '16 18 16  '$ CREATININE 0.88 0.97 0.96  CALCIUM 9.0 8.7* 8.0*  MG  --  2.0  --     GFR: Estimated Creatinine Clearance: 83.6 mL/min (by C-G formula based on SCr of 0.96 mg/dL). Liver Function Tests: Recent Labs  Lab 11/11/20 1242 11/12/20 0454 11/13/20 0358  AST 17 22 12*  ALT '13 12 10  '$ ALKPHOS 80 80 56  BILITOT 0.8 0.6 0.5  PROT 6.8 7.0 5.3*  ALBUMIN 3.9 3.7 2.9*  Recent Labs  Lab 11/11/20 1242 11/13/20 0358  LIPASE 27 32    No results for input(s): AMMONIA in the last 168 hours. Coagulation Profile: No results for input(s): INR, PROTIME in the last 168 hours. Cardiac Enzymes: No results for input(s): CKTOTAL, CKMB, CKMBINDEX, TROPONINI in the last 168 hours. BNP (last 3 results) No results for input(s): PROBNP in the last 8760 hours. HbA1C: No results for input(s): HGBA1C in the last 72 hours. CBG: Recent Labs  Lab 11/12/20 0714 11/12/20 0935 11/12/20 1706 11/13/20 0116 11/13/20 0601  GLUCAP 77 113* 76 74 85    Lipid Profile: No results for input(s): CHOL, HDL, LDLCALC, TRIG, CHOLHDL, LDLDIRECT in the last 72 hours. Thyroid Function Tests: No results for input(s): TSH, T4TOTAL, FREET4, T3FREE, THYROIDAB in the last 72 hours. Anemia Panel: Recent Labs    11/12/20 0958  FERRITIN 37   Sepsis Labs: Recent Labs  Lab 11/12/20 0958  PROCALCITON <0.10    Recent Results (from the past 240 hour(s))  SARS CORONAVIRUS 2 (TAT 6-24 HRS) Nasopharyngeal Nasopharyngeal Swab     Status: Abnormal   Collection Time: 11/11/20  9:02 PM   Specimen: Nasopharyngeal Swab  Result Value Ref Range Status   SARS Coronavirus 2 POSITIVE (A) NEGATIVE Final    Comment: (NOTE) SARS-CoV-2 target nucleic acids are DETECTED.  The SARS-CoV-2 RNA is generally detectable in upper and lower respiratory  specimens during the acute phase of infection. Positive results are indicative of the presence of SARS-CoV-2 RNA. Clinical correlation with patient history and other diagnostic information is  necessary to determine patient infection status. Positive results do not rule out bacterial infection or co-infection with other viruses.  The expected result is Negative.  Fact Sheet for Patients: SugarRoll.be  Fact Sheet for Healthcare Providers: https://www.woods-mathews.com/  This test is not yet approved or cleared by the [place] FDA and  has been authorized for detection and/or diagnosis of SARS-CoV-2 by FDA under an Emergency Use Authorization (EUA). This EUA will remain  in effect (meaning this test can be used) for the duration of the COVID-19 declaration under Section 564(b)(1) of the Act, 21 U. S.C. section 360bbb-3(b)(1), unless the authorization is terminated or revoked sooner.   Performed at [facility], [place] [address] [place], [place] [address]       Radiology Studies: US SOFT TISSUE HEAD & NECK (NON-THYROID)  Result Date: 11/11/2020 CLINICAL DATA:  Subcutaneous mass on the right side of neck and on the left cheek for several years. EXAM: ULTRASOUND OF HEAD/NECK SOFT TISSUES TECHNIQUE: Ultrasound examination of the head and neck soft tissues was performed in the area of clinical concern. COMPARISON:  None. FINDINGS: Heterogeneous circumscribed ovoid nodular soft tissue attenuation lesions are demonstrated correlating to the palpable lesions in the left cheek and right lateral neck. The right neck lesion measures 1.6 x 0.8 x 1.5 cm. The left cheek lesion measures 1.8 x 0.6 x 2 cm. The appearance is nonspecific and could represent enlarged lymph nodes although typical lymph node architecture is not present suggesting abnormal lymph nodes. Other solid soft tissue mass or neoplasm could also have this appearance. Consider  contrast-enhanced CT or MRI for further evaluation. IMPRESSION: Ovoid solid nodules demonstrated in the left cheek and right neck corresponding to palpable lesions. Appearances are nonspecific and abnormal lymph node versus soft tissue mass is possible. Consider contrast-enhanced CT or MRI for further evaluation. Electronically Signed   By: Lucienne Capers M.D.   On: 11/11/2020 21:22  CT ABDOMEN PELVIS W CONTRAST  Result Date: 11/11/2020 CLINICAL DATA:  RIGHT UPPER QUADRANT pain. History of rupture ulcer. EXAM: CT ABDOMEN AND PELVIS WITH CONTRAST TECHNIQUE: Multidetector CT imaging of the abdomen and pelvis was performed using the standard protocol following bolus administration of intravenous contrast. CONTRAST:  28m OMNIPAQUE IOHEXOL 350 MG/ML SOLN COMPARISON:  12/09/2016 FINDINGS: Lower chest: Lung bases are unremarkable.  Heart size is normal. Hepatobiliary: Liver is mildly heterogeneous without suspicious lesion. There are tiny subcentimeter probable cysts noted. Gallbladder is present. Pancreas: Unremarkable. No pancreatic ductal dilatation or surrounding inflammatory changes. Spleen: Normal in size without focal abnormality. Adrenals/Urinary Tract: Adrenal glands are unremarkable. Kidneys are normal, without renal calculi, focal lesion, or hydronephrosis. Bladder is decompressed. Stomach/Bowel: Stomach has a normal appearance. There are dilated loops of small bowel primarily within the LEFT UPPER QUADRANT without definite transition zone. The more distal small bowel loops are normal in caliber. Colon is unremarkable. Evaluation of bowel is slightly limited by a lack of oral contrast. The appendix is normal. Vascular/Lymphatic: There is atherosclerotic calcification of the abdominal aorta, not associated with aneurysm. Although involved by atherosclerosis, there is vascular opacification of the celiac axis, superior mesenteric artery, and inferior mesenteric artery. Normal appearance of the portal  venous system and inferior vena cava. Portal venous system is unremarkable. No retroperitoneal or mesenteric adenopathy. Reproductive: Prostate is unremarkable. Other: Small amount of ascites. No free intraperitoneal air. Abdominal wall is unremarkable. Musculoskeletal: There are degenerative changes in the lumbar spine. IMPRESSION: 1. Mildly dilated loops of small bowel primarily within the LEFT UPPER QUADRANT, without definite transition zone. The findings favor ileus over bowel obstruction. 2. Normal appendix. 3. Small amount of ascites. Electronically Signed   By: ENolon NationsM.D.   On: 11/11/2020 17:05   [place]oreaAbdomen Limited  Result Date: 11/11/2020 CLINICAL DATA:  Right upper quadrant pain. EXAM: ULTRASOUND ABDOMEN LIMITED RIGHT UPPER QUADRANT COMPARISON:  Current CT of the abdomen and pelvis. FINDINGS: Gallbladder: Distended gallbladder. Mild wall irregularity, without wall thickening. Small dependent echogenic foci suggested possibly stones or small polyps. No pericholecystic fluid. No sonographic Murphy's sign. Common bile duct: Diameter: 6 mm Liver: No focal lesion identified. Within normal limits in parenchymal echogenicity. Portal vein is patent on color Doppler imaging with normal direction of blood flow towards the liver. Other: Small amount of ascites. IMPRESSION: 1. No acute findings. 2. Distended gallbladder. Wall appears slightly irregular but is not thickened. Small dependent echogenic foci may reflect stones or small polyps. 3. Common bile duct top normal in caliber.  Liver unremarkable. Electronically Signed   By: DLajean ManesM.D.   On: 11/11/2020 19:00     LOS: 1 day   RAntonieta Pert MD [facility]  11/13/2020, 11:37 AM

## 2020-12-25 ENCOUNTER — Encounter (HOSPITAL_COMMUNITY): Payer: Self-pay | Admitting: Radiology

## 2021-05-24 ENCOUNTER — Emergency Department (HOSPITAL_COMMUNITY): Payer: Self-pay

## 2021-05-24 ENCOUNTER — Encounter (HOSPITAL_COMMUNITY): Payer: Self-pay | Admitting: General Surgery

## 2021-05-24 ENCOUNTER — Other Ambulatory Visit: Payer: Self-pay

## 2021-05-24 ENCOUNTER — Inpatient Hospital Stay (HOSPITAL_COMMUNITY)
Admission: EM | Admit: 2021-05-24 | Discharge: 2021-05-28 | DRG: 199 | Disposition: A | Discharge status: Home / Self Care | Payer: Self-pay | Attending: General Surgery | Admitting: General Surgery

## 2021-05-24 ENCOUNTER — Encounter: Payer: Self-pay | Admitting: General Surgery

## 2021-05-24 DIAGNOSIS — Z23 Encounter for immunization: Secondary | ICD-10-CM

## 2021-05-24 DIAGNOSIS — R040 Epistaxis: Secondary | ICD-10-CM | POA: Diagnosis present

## 2021-05-24 DIAGNOSIS — T1490XA Injury, unspecified, initial encounter: Secondary | ICD-10-CM

## 2021-05-24 DIAGNOSIS — N179 Acute kidney failure, unspecified: Secondary | ICD-10-CM | POA: Diagnosis present

## 2021-05-24 DIAGNOSIS — J939 Pneumothorax, unspecified: Principal | ICD-10-CM

## 2021-05-24 DIAGNOSIS — S270XXA Traumatic pneumothorax, initial encounter: Principal | ICD-10-CM

## 2021-05-24 DIAGNOSIS — S0101XA Laceration without foreign body of scalp, initial encounter: Secondary | ICD-10-CM | POA: Diagnosis present

## 2021-05-24 DIAGNOSIS — Z886 Allergy status to analgesic agent status: Secondary | ICD-10-CM

## 2021-05-24 DIAGNOSIS — M542 Cervicalgia: Secondary | ICD-10-CM | POA: Diagnosis present

## 2021-05-24 DIAGNOSIS — S21111A Laceration without foreign body of right front wall of thorax without penetration into thoracic cavity, initial encounter: Secondary | ICD-10-CM | POA: Diagnosis present

## 2021-05-24 DIAGNOSIS — D62 Acute posthemorrhagic anemia: Secondary | ICD-10-CM | POA: Diagnosis present

## 2021-05-24 DIAGNOSIS — E872 Acidosis, unspecified: Secondary | ICD-10-CM | POA: Diagnosis present

## 2021-05-24 DIAGNOSIS — R7989 Other specified abnormal findings of blood chemistry: Secondary | ICD-10-CM | POA: Diagnosis present

## 2021-05-24 DIAGNOSIS — W269XXA Contact with unspecified sharp object(s), initial encounter: Secondary | ICD-10-CM

## 2021-05-24 DIAGNOSIS — N390 Urinary tract infection, site not specified: Secondary | ICD-10-CM | POA: Diagnosis present

## 2021-05-24 DIAGNOSIS — Z20822 Contact with and (suspected) exposure to covid-19: Secondary | ICD-10-CM | POA: Diagnosis present

## 2021-05-24 DIAGNOSIS — Z5329 Procedure and treatment not carried out because of patient's decision for other reasons: Secondary | ICD-10-CM | POA: Diagnosis present

## 2021-05-24 DIAGNOSIS — R578 Other shock: Secondary | ICD-10-CM | POA: Diagnosis present

## 2021-05-24 DIAGNOSIS — J439 Emphysema, unspecified: Secondary | ICD-10-CM | POA: Diagnosis present

## 2021-05-24 LAB — I-STAT CHEM 8, ED
BUN: 20 mg/dL (ref 6–20)
Calcium, Ion: 1.12 mmol/L — ABNORMAL LOW (ref 1.15–1.40)
Chloride: 108 mmol/L (ref 98–111)
Creatinine, Ser: 1.1 mg/dL (ref 0.61–1.24)
Glucose, Bld: 107 mg/dL — ABNORMAL HIGH (ref 70–99)
HCT: 39 % (ref 39.0–52.0)
Hemoglobin: 13.3 g/dL (ref 13.0–17.0)
Potassium: 3.6 mmol/L (ref 3.5–5.1)
Sodium: 140 mmol/L (ref 135–145)
TCO2: 14 mmol/L — ABNORMAL LOW (ref 22–32)

## 2021-05-24 LAB — RESP PANEL BY RT-PCR (FLU A&B, COVID) ARPGX2
Influenza A by PCR: NEGATIVE
Influenza B by PCR: NEGATIVE
SARS Coronavirus 2 by RT PCR: NEGATIVE

## 2021-05-24 LAB — PROTIME-INR
INR: 1.3 — ABNORMAL HIGH (ref 0.8–1.2)
Prothrombin Time: 16.4 seconds — ABNORMAL HIGH (ref 11.4–15.2)

## 2021-05-24 LAB — ETHANOL: Alcohol, Ethyl (B): <10 mg/dL (ref <10)

## 2021-05-24 LAB — CBC
HCT: 38.6 % — ABNORMAL LOW (ref 39.0–52.0)
Hemoglobin: 11.4 g/dL — ABNORMAL LOW (ref 13.0–17.0)
MCH: 24.8 pg — ABNORMAL LOW (ref 26.0–34.0)
MCHC: 29.5 g/dL — ABNORMAL LOW (ref 30.0–36.0)
MCV: 84.1 fL (ref 80.0–100.0)
Platelets: 287 10*3/uL (ref 150–400)
RBC: 4.59 MIL/uL (ref 4.22–5.81)
RDW: 14.7 % (ref 11.5–15.5)
WBC: 10 10*3/uL (ref 4.0–10.5)
nRBC: 0 % (ref 0.0–0.2)

## 2021-05-24 LAB — HIV ANTIBODY (ROUTINE TESTING W REFLEX): HIV Screen 4th Generation wRfx: NONREACTIVE

## 2021-05-24 LAB — COMPREHENSIVE METABOLIC PANEL
ALT: 12 U/L (ref 0–44)
AST: 24 U/L (ref 15–41)
Albumin: 3.5 g/dL (ref 3.5–5.0)
Alkaline Phosphatase: 83 U/L (ref 38–126)
Anion gap: 21 — ABNORMAL HIGH (ref 5–15)
BUN: 17 mg/dL (ref 6–20)
CO2: 11 mmol/L — ABNORMAL LOW (ref 22–32)
Calcium: 8.6 mg/dL — ABNORMAL LOW (ref 8.9–10.3)
Chloride: 107 mmol/L (ref 98–111)
Creatinine, Ser: 1.34 mg/dL — ABNORMAL HIGH (ref 0.61–1.24)
GFR, Estimated: >60 mL/min (ref >60)
Glucose, Bld: 114 mg/dL — ABNORMAL HIGH (ref 70–99)
Potassium: 3.4 mmol/L — ABNORMAL LOW (ref 3.5–5.1)
Sodium: 139 mmol/L (ref 135–145)
Total Bilirubin: 0.4 mg/dL (ref 0.3–1.2)
Total Protein: 7 g/dL (ref 6.5–8.1)

## 2021-05-24 LAB — URINALYSIS, ROUTINE W REFLEX MICROSCOPIC
Bilirubin Urine: NEGATIVE
Glucose, UA: NEGATIVE mg/dL
Hgb urine dipstick: NEGATIVE
Ketones, ur: NEGATIVE mg/dL
Leukocytes,Ua: NEGATIVE
Nitrite: NEGATIVE
Protein, ur: NEGATIVE mg/dL
Specific Gravity, Urine: 1.026 (ref 1.005–1.030)
pH: 6 (ref 5.0–8.0)

## 2021-05-24 LAB — LACTIC ACID, PLASMA: Lactic Acid, Venous: >9 mmol/L (ref 0.5–1.9)

## 2021-05-24 LAB — ABO/RH: ABO/RH(D): A POS

## 2021-05-24 MED ORDER — LACTATED RINGERS IV SOLN: INTRAVENOUS | Status: DC

## 2021-05-24 MED ORDER — IOHEXOL 350 MG/ML SOLN
80.0000 mL | Freq: Once | INTRAVENOUS | Status: AC | PRN
  Administered 2021-05-24: 80 mL via INTRAVENOUS

## 2021-05-24 MED ORDER — ONDANSETRON HCL 4 MG/2ML IJ SOLN: 4.0000 mg | Freq: Four times a day (QID) | INTRAMUSCULAR | Status: DC | PRN

## 2021-05-24 MED ORDER — OXYCODONE HCL 5 MG PO TABS
5.0000 mg | ORAL_TABLET | ORAL | Status: DC | PRN
  Administered 2021-05-26 – 2021-05-27 (×2): 5 mg via ORAL
  Filled 2021-05-24 (×2): qty 1

## 2021-05-24 MED ORDER — ONDANSETRON 4 MG PO TBDP: 4.0000 mg | ORAL_TABLET | Freq: Four times a day (QID) | ORAL | Status: DC | PRN

## 2021-05-24 MED ORDER — METOPROLOL TARTRATE 5 MG/5ML IV SOLN: 5.0000 mg | Freq: Four times a day (QID) | INTRAVENOUS | Status: DC | PRN

## 2021-05-24 MED ORDER — TETANUS-DIPHTH-ACELL PERTUSSIS 5-2.5-18.5 LF-MCG/0.5 IM SUSY
0.5000 mL | PREFILLED_SYRINGE | Freq: Once | INTRAMUSCULAR | Status: AC
  Administered 2021-05-24: 0.5 mL via INTRAMUSCULAR

## 2021-05-24 MED ORDER — ENOXAPARIN SODIUM 30 MG/0.3ML IJ SOSY
30.0000 mg | PREFILLED_SYRINGE | Freq: Two times a day (BID) | INTRAMUSCULAR | Status: DC
  Administered 2021-05-25 – 2021-05-28 (×6): 30 mg via SUBCUTANEOUS
  Filled 2021-05-24 (×6): qty 0.3

## 2021-05-24 MED ORDER — OXYCODONE HCL 5 MG PO TABS
10.0000 mg | ORAL_TABLET | ORAL | Status: DC | PRN
  Administered 2021-05-25 – 2021-05-26 (×3): 10 mg via ORAL
  Filled 2021-05-24 (×3): qty 2

## 2021-05-24 MED ORDER — FENTANYL CITRATE (PF) 100 MCG/2ML IJ SOLN
INTRAMUSCULAR | Status: AC
  Filled 2021-05-24: qty 2

## 2021-05-24 MED ORDER — HYDROMORPHONE HCL 1 MG/ML IJ SOLN
0.5000 mg | INTRAMUSCULAR | Status: DC | PRN
  Administered 2021-05-24 – 2021-05-26 (×4): 0.5 mg via INTRAVENOUS
  Filled 2021-05-24 (×4): qty 0.5

## 2021-05-24 MED ORDER — ACETAMINOPHEN 325 MG PO TABS
650.0000 mg | ORAL_TABLET | Freq: Four times a day (QID) | ORAL | Status: DC
  Administered 2021-05-25 – 2021-05-28 (×10): 650 mg via ORAL
  Filled 2021-05-24 (×10): qty 2

## 2021-05-24 MED ORDER — SODIUM CHLORIDE 0.9 % IV SOLN
INTRAVENOUS | Status: AC | PRN
  Administered 2021-05-24: 1000 mL via INTRAVENOUS

## 2021-05-24 MED ORDER — CEFAZOLIN SODIUM-DEXTROSE 2-4 GM/100ML-% IV SOLN
2.0000 g | Freq: Once | INTRAVENOUS | Status: AC
  Administered 2021-05-24: 2 g via INTRAVENOUS
  Filled 2021-05-24: qty 100

## 2021-05-24 MED ORDER — FENTANYL CITRATE PF 50 MCG/ML IJ SOSY
PREFILLED_SYRINGE | INTRAMUSCULAR | Status: AC | PRN
Start: 2021-05-24 — End: 2021-05-24
  Administered 2021-05-24 (×2): 50 ug via INTRAVENOUS

## 2021-05-24 NOTE — Progress Notes (Signed)
Orthopedic Tech Progress Note ?Patient Details:  ?Marvin Thomas ?03/24/1875 ?[id number] ?Level 1 Trauma. Not needed ?Patient ID: Marvin Thomas, male   DOB: 03/24/1875, 56 y.o.   MRN: [id number] ? ? A  ?05/24/2021, 3:48 PM ? ?

## 2021-05-24 NOTE — Procedures (Signed)
Chest tube insertion ? ?Date/Time: 05/24/2021 4:30 PM ?Performed by: Georganna Skeans, MD ?Authorized by: Georganna Skeans, MD  ? ?Consent:  ?  Consent obtained:  Emergent situation ?Pre-procedure details:  ?  Skin preparation:  ChloraPrep ?Anesthesia (see MAR for exact dosages):  ?  Anesthesia method:  Local infiltration ?Procedure details:  ?  Placement location:  R lateral ?  Tube size (Fr):  Minicatheter ?  Technique: Seldinger   ?  Tube connected to:  Suction ?  Drainage characteristics:  Bloody ?  Dressing:  4x4 sterile gauze ?Post-procedure details:  ?  Post-insertion x-ray findings: tube in good position   ?  Patient tolerance of procedure:  Tolerated well, no immediate complications ?Georganna Skeans, MD, MPH, FACS ?Please use [email or web address] to contact on call provider ? ?
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
## 2021-05-24 NOTE — ED Notes (Signed)
Patient refusing sutures to scalp x2 now.  ?

## 2021-05-24 NOTE — ED Notes (Signed)
Patient transported to CT with TMD/EDP/ ED resident  ?

## 2021-05-24 NOTE — ED Notes (Signed)
Trauma Start 
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
## 2021-05-25 ENCOUNTER — Inpatient Hospital Stay (HOSPITAL_COMMUNITY): Payer: Self-pay

## 2021-05-25 LAB — BPAM RBC
Blood Product Expiration Date: 202304062359
ISSUE DATE / TIME: 202303031533
Unit Type and Rh: 5100

## 2021-05-25 LAB — BASIC METABOLIC PANEL
Anion gap: 10 (ref 5–15)
BUN: 11 mg/dL (ref 6–20)
CO2: 20 mmol/L — ABNORMAL LOW (ref 22–32)
Calcium: 8.2 mg/dL — ABNORMAL LOW (ref 8.9–10.3)
Chloride: 107 mmol/L (ref 98–111)
Creatinine, Ser: 0.83 mg/dL (ref 0.61–1.24)
GFR, Estimated: >60 mL/min (ref >60)
Glucose, Bld: 94 mg/dL (ref 70–99)
Potassium: 3.7 mmol/L (ref 3.5–5.1)
Sodium: 137 mmol/L (ref 135–145)

## 2021-05-25 LAB — BLOOD PRODUCT ORDER (VERBAL) VERIFICATION

## 2021-05-25 LAB — CBC
HCT: 35.5 % — ABNORMAL LOW (ref 39.0–52.0)
Hemoglobin: 11.4 g/dL — ABNORMAL LOW (ref 13.0–17.0)
MCH: 25.4 pg — ABNORMAL LOW (ref 26.0–34.0)
MCHC: 32.1 g/dL (ref 30.0–36.0)
MCV: 79.2 fL — ABNORMAL LOW (ref 80.0–100.0)
Platelets: 229 10*3/uL (ref 150–400)
RBC: 4.48 MIL/uL (ref 4.22–5.81)
RDW: 14.4 % (ref 11.5–15.5)
WBC: 10.9 10*3/uL — ABNORMAL HIGH (ref 4.0–10.5)
nRBC: 0 % (ref 0.0–0.2)

## 2021-05-25 LAB — TYPE AND SCREEN
ABO/RH(D): A POS
Antibody Screen: NEGATIVE
Unit division: 0

## 2021-05-25 MED ORDER — METHOCARBAMOL 750 MG PO TABS
750.0000 mg | ORAL_TABLET | Freq: Three times a day (TID) | ORAL | Status: DC
Start: 2021-05-25 — End: 2021-05-28
  Administered 2021-05-25 – 2021-05-28 (×9): 750 mg via ORAL
  Filled 2021-05-25 (×9): qty 1

## 2021-05-25 NOTE — Plan of Care (Signed)
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
## 2021-05-25 NOTE — Progress Notes (Signed)
Pt continues to refuse full assessment, repositioning, bath, etc. He has been allowing partial assessment, vital signs and labs. Unable to determine pt orientation status but pt will follow commands intermittently. ?

## 2021-05-25 NOTE — Progress Notes (Signed)
Patient sleeping.  Requested to not be disturbed.  Here to complete Admission Assessment. ?

## 2021-05-25 NOTE — Progress Notes (Signed)
?  Transition of Care (TOC) Screening Note ? ? ?Patient Details  ?Name: Marvin Thomas ?Date of Birth: Aug 18, 1965 ? ? ?Transition of Care (TOC) CM/SW Contact:    ?Alfredia Ferguson, LCSW ?Phone Number: ?05/25/2021, 8:36 AM ? ? ? ?Transition of Care Department [facility]) has reviewed patient and noted no immediate TOC needs pending continued medical work-up. TOC team will continue to monitor patient advancement through interdisciplinary progression rounds to support any identified discharge supports as needed. If new patient transition needs arise, please place a TOC consult or reach out to [facility] team.  ?  ?

## 2021-05-25 NOTE — Progress Notes (Signed)
Patient ID: Marvin Thomas, male   DOB: March 30, 1965, 56 y.o.   MRN: [id number] ?   ?  ?Subjective: ?C/O R lateral neck pain ?ROS negative except as listed above. ?Objective: ?Vital signs in last 24 hours: ?Temp:  [97 ?F (36.1 ?C)-98.4 ?F (36.9 ?C)] 98.4 ?F (36.9 ?C) (03/03 1938) ?Pulse Rate:  [51-88] 51 (03/04 0709) ?Resp:  [17-31] 19 (03/04 0709) ?BP: (72-142)/(0-90) 136/68 (03/04 0709) ?SpO2:  [95 %-100 %] 95 % (03/04 0709) ?Weight:  [81.6 kg] 81.6 kg (03/03 1630) ?Last BM Date :  (PTA) ? ?Intake/Output from previous day: ?03/03 0701 - 03/04 0700 ?In: 1545.8 [I.V.:1545.8] ?Out: 620 [Urine:600; Chest Tube:20] ?Intake/Output this shift: ?No intake/output data recorded. ? ?General appearance: cooperative ?Resp: clear to auscultation bilaterally ?Chest wall: R chest tube no air leak ?Cardio: regular rate and rhythm ?GI: soft, NT ?Extremities: calves soft ? ?Lab Results: ?CBC  ?Recent Labs  ?  05/24/21 ?1539 05/24/21 ?1544 05/25/21 ?0232  ?WBC 10.0  --  10.9*  ?HGB 11.4* 13.3 11.4*  ?HCT 38.6* 39.0 35.5*  ?PLT 287  --  229  ? ?BMET ?Recent Labs  ?  05/24/21 ?1539 05/24/21 ?1544 05/25/21 ?0232  ?NA 139 140 137  ?K 3.4* 3.6 3.7  ?CL 107 108 107  ?CO2 11*  --  20*  ?GLUCOSE 114* 107* 94  ?BUN '17 20 11  '$ ?CREATININE 1.34* 1.10 0.83  ?CALCIUM 8.6*  --  8.2*  ? ?PT/INR ?Recent Labs  ?  05/24/21 ?1539  ?LABPROT 16.4*  ?INR 1.3*  ? ?ABG ?No results for input(s): PHART, HCO3 in the last 72 hours. ? ?Invalid input(s): PCO2, PO2 ? ?Studies/Results: ? ? ?Anti-infectives: ?Anti-infectives (From admission, onward)  ? ? Start     Dose/Rate Route Frequency Ordered Stop  ? 05/24/21 1615  ceFAZolin (ANCEF) IVPB 2g/100 mL premix       ? 2 g ?200 mL/hr over 30 Minutes Intravenous  Once 05/24/21 1612 05/24/21 1647  ? ?  ? ? ?Assessment/Plan: ?SW Chest X 2 - R PTX, continue suction today, CXR in AM, significant bullous disease ?Scalp lac - closed in ED ?ABL anemia ?AKI - resolved with resuscitation ?Neck pain R - MS, schedule Robaxin ?ID -  Ancef X 1 in ED ?FEN - Diet, D/C IVF ?VTE - Lovenox ?Dispo - chest tube ?High level medical decision making ? LOS: 1 day  ? ? ?Georganna Skeans, MD, MPH, FACS ?Trauma & General Surgery ?Use [email or web address] to contact on call provider ? ?05/25/2021  ?

## 2021-05-26 ENCOUNTER — Encounter (HOSPITAL_COMMUNITY): Payer: Self-pay | Admitting: General Surgery

## 2021-05-26 ENCOUNTER — Inpatient Hospital Stay (HOSPITAL_COMMUNITY): Payer: Self-pay

## 2021-05-26 LAB — BASIC METABOLIC PANEL
Anion gap: 8 (ref 5–15)
BUN: 11 mg/dL (ref 6–20)
CO2: 24 mmol/L (ref 22–32)
Calcium: 8.1 mg/dL — ABNORMAL LOW (ref 8.9–10.3)
Chloride: 101 mmol/L (ref 98–111)
Creatinine, Ser: 0.89 mg/dL (ref 0.61–1.24)
GFR, Estimated: >60 mL/min (ref >60)
Glucose, Bld: 103 mg/dL — ABNORMAL HIGH (ref 70–99)
Potassium: 3.9 mmol/L (ref 3.5–5.1)
Sodium: 133 mmol/L — ABNORMAL LOW (ref 135–145)

## 2021-05-26 LAB — CBC
HCT: 42 % (ref 39.0–52.0)
Hemoglobin: 13 g/dL (ref 13.0–17.0)
MCH: 24.9 pg — ABNORMAL LOW (ref 26.0–34.0)
MCHC: 31 g/dL (ref 30.0–36.0)
MCV: 80.5 fL (ref 80.0–100.0)
Platelets: 173 10*3/uL (ref 150–400)
RBC: 5.22 MIL/uL (ref 4.22–5.81)
RDW: 15.3 % (ref 11.5–15.5)
WBC: 11.3 10*3/uL — ABNORMAL HIGH (ref 4.0–10.5)
nRBC: 0 % (ref 0.0–0.2)

## 2021-05-26 NOTE — Progress Notes (Signed)
PT Cancellation Note ? ?Patient Details ?Name: Marvin Thomas ?MRN: [id number] ?DOB: 06-25-1965 ? ? ?Cancelled Treatment:    Reason Eval/Treat Not Completed: Fatigue/lethargy limiting ability to participate. PT attempted x 2. Pt sleeping both attempts. Asking therapist to come back later on first attempt. Unwilling to open eyes and acknowledge therapist on second attempt. ? ? ?Lorriane Shire ?05/26/2021, 11:23 AM ? ?Lorrin Goodell, PT  ?Office # [phone] ?Pager [phone] ? ?

## 2021-05-26 NOTE — Progress Notes (Signed)
Patient ID: Marvin Thomas, male   DOB: 06/25/1965, 56 y.o.   MRN: [id number] ?   ?  ?Subjective: ?No SOB, got up to chair yesterday ?ROS negative except as listed above. ?Objective: ?Vital signs in last 24 hours: ?Temp:  [98.4 ?F (36.9 ?C)-99.1 ?F (37.3 ?C)] 98.8 ?F (37.1 ?C) (03/05 1497) ?Pulse Rate:  [53-63] 63 (03/05 0702) ?Resp:  [17-24] 22 (03/05 0263) ?BP: (136-153)/(71-81) 138/73 (03/05 7858) ?SpO2:  [91 %-95 %] 93 % (03/05 0702) ?Last BM Date :  (PTA) ? ?Intake/Output from previous day: ?03/04 0701 - 03/05 0700 ?In: 615.8 [P.O.:360; I.V.:255.8] ?Out: 727 [Urine:725; Chest Tube:2] ?Intake/Output this shift: ?No intake/output data recorded. ? ?General appearance: alert and cooperative ?Resp: clear to auscultation bilaterally ?Cardio: regular rate and rhythm ?GI: soft, NT ?Extremities: calves soft ?Chest tube no air leak ? ?Lab Results: ?CBC  ?Recent Labs  ?  05/25/21 ?0232 05/26/21 ?0251  ?WBC 10.9* 11.3*  ?HGB 11.4* 13.0  ?HCT 35.5* 42.0  ?PLT 229 173  ? ?BMET ?Recent Labs  ?  05/25/21 ?0232 05/26/21 ?0439  ?NA 137 133*  ?K 3.7 3.9  ?CL 107 101  ?CO2 20* 24  ?GLUCOSE 94 103*  ?BUN 11 11  ?CREATININE 0.83 0.89  ?CALCIUM 8.2* 8.1*  ? ?PT/INR ?Recent Labs  ?  05/24/21 ?1539  ?LABPROT 16.4*  ?INR 1.3*  ? ?ABG ?No results for input(s): PHART, HCO3 in the last 72 hours. ? ?Invalid input(s): PCO2, PO2 ? ?Studies/Results: ? ? ?Anti-infectives: ?Anti-infectives (From admission, onward)  ? ? Start     Dose/Rate Route Frequency Ordered Stop  ? 05/24/21 1615  ceFAZolin (ANCEF) IVPB 2g/100 mL premix       ? 2 g ?200 mL/hr over 30 Minutes Intravenous  Once 05/24/21 1612 05/24/21 1647  ? ?  ? ? ?Assessment/Plan: ?SW Chest X 2 - R PTX, chest tube to water seal today, CXR in AM, pulm toilet ?Scalp lac - closed in ED ?ABL anemia ?AKI - resolved with resuscitation ?Neck pain R - MS, better on scheduled Robaxin ?ID - Ancef X 1 in ED ?FEN - Diet ?VTE - Lovenox ?Dispo - chest tube ?moderate level medical decision making ? ? LOS:  2 days  ? ? ?Georganna Skeans, MD, MPH, FACS ?Trauma & General Surgery ?Use [email or web address] to contact on call provider ? ?05/26/2021  ?

## 2021-05-27 ENCOUNTER — Inpatient Hospital Stay (HOSPITAL_COMMUNITY): Payer: Self-pay
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
## 2021-05-27 NOTE — Progress Notes (Signed)
Patient ID: Marvin Thomas, male   DOB: Jun 30, 1965, 56 y.o.   MRN: [id number] ?   ?  ?Subjective: ?No SOB ?ROS negative except as listed above. ?Objective: ?Vital signs in last 24 hours: ?Temp:  [98.9 ?F (37.2 ?C)-99 ?F (37.2 ?C)] 98.9 ?F (37.2 ?C) (03/05 1910) ?Pulse Rate:  [69-81] 69 (03/06 0300) ?Resp:  [17-22] 20 (03/06 0300) ?BP: (110-135)/(61-84) 110/61 (03/06 0300) ?SpO2:  [91 %-94 %] 91 % (03/06 0300) ?Last BM Date :  (PTA) ? ?Intake/Output from previous day: ?03/05 0701 - 03/06 0700 ?In: -  ?Out: 1080 [[id number]; Chest Tube:5] ?Intake/Output this shift: ?No intake/output data recorded. ? ?General appearance: cooperative ?Resp: clear to auscultation bilaterally ?Chest wall: antrerior SWs CDI with staples ?Cardio: regular rate and rhythm ?GI: soft. NT ?Extremities: calves soft ? ?Lab Results: ?CBC  ?Recent Labs  ?  05/25/21 ?0232 05/26/21 ?0251  ?WBC 10.9* 11.3*  ?HGB 11.4* 13.0  ?HCT 35.5* 42.0  ?PLT 229 173  ? ?BMET ?Recent Labs  ?  05/25/21 ?0232 05/26/21 ?0439  ?NA 137 133*  ?K 3.7 3.9  ?CL 107 101  ?CO2 20* 24  ?GLUCOSE 94 103*  ?BUN 11 11  ?CREATININE 0.83 0.89  ?CALCIUM 8.2* 8.1*  ? ?PT/INR ?Recent Labs  ?  05/24/21 ?1539  ?LABPROT 16.4*  ?INR 1.3*  ? ?ABG ?No results for input(s): PHART, HCO3 in the last 72 hours. ? ?Invalid input(s): PCO2, PO2 ? ?Studies/Results: ?DG CHEST PORT 1 VIEW ? ?Result Date: 05/27/2021 ?CLINICAL DATA:  56 year old male with history of pneumothorax. Right-sided chest tube. EXAM: PORTABLE CHEST 1 VIEW COMPARISON:  Chest x-ray 05/26/2021. FINDINGS: Right-sided chest tube in position with pigtail reformed in the upper right hemithorax. Interval reaccumulation of a small right pneumothorax. Trace left pleural effusion. No definite right pleural effusion. Patchy areas of interstitial prominence throughout the mid to lower lungs bilaterally, along with mild diffuse peribronchial cuffing. Extensive emphysema. No evidence of pulmonary edema. Heart size is normal. Upper mediastinal  contours are within normal limits. Surgical clips project over the left hilar region. IMPRESSION: 1. Interval reaccumulation of small right pneumothorax. Right-sided chest tube is stable in position. 2. Patchy areas of interstitial prominence and peribronchial cuffing in the mid to lower lungs bilaterally, which may suggest developing bronchitis. 3. Emphysema. Electronically Signed   By: Vinnie Langton M.D.   On: 05/27/2021 07:46  ? ?DG CHEST PORT 1 VIEW ? ?Result Date: 05/26/2021 ?CLINICAL DATA:  Shortness of breath and chest pain. Follow-up pneumothorax. EXAM: PORTABLE CHEST 1 VIEW COMPARISON:  05/25/2021 FINDINGS: Stable position of right chest tube with pigtail overlying the right lung apex. Advanced changes of bullous emphysema again noted. No definite pneumothorax identified on today's study. Mild residual airspace opacity within the right upper lobe appears unchanged from the previous exam. No new findings. IMPRESSION: 1. Stable position of right chest tube. No definite pneumothorax identified on today's study. 2. No change in patchy airspace opacity in the right upper lobe. Electronically Signed   By: Kerby Moors M.D.   On: 05/26/2021 09:10   ? ?Anti-infectives: ?Anti-infectives (From admission, onward)  ? ? Start     Dose/Rate Route Frequency Ordered Stop  ? 05/24/21 1615  ceFAZolin (ANCEF) IVPB 2g/100 mL premix       ? 2 g ?200 mL/hr over 30 Minutes Intravenous  Once 05/24/21 1612 05/24/21 1647  ? ?  ? ? ?Assessment/Plan: ?SW Chest X 2 - R PTX, chest tube to water seal 3/5, CXR today small lower  PTX. Continue water seal. CXR in AM ?Scalp lac - closed in ED ?ABL anemia ?AKI - resolved with resuscitation ?Neck pain R - MS, better on scheduled Robaxin ?ID - Ancef X 1 in ED ?FEN - Diet ?VTE - Lovenox ?Dispo - chest tube ?moderate level medical decision making ? ? LOS: 3 days  ? ? ?Georganna Skeans, MD, MPH, FACS ?Trauma & General Surgery ?Use [email or web address] to contact on call provider ? ?05/27/2021  ?

## 2021-05-28 ENCOUNTER — Inpatient Hospital Stay (HOSPITAL_COMMUNITY): Payer: Self-pay
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
## 2021-05-28 NOTE — Progress Notes (Signed)
Patient ID: Marvin Thomas, male   DOB: Nov 26, 1965, 56 y.o.   MRN: [id number] ?   ?  ?Subjective: ? ?No complaints. No SHOB - has [place] in nares but not connected to wall O2 and sats >90%. No pain. Has been ambulating ? ?Objective: ?Vital signs in last 24 hours: ?Temp:  [98.5 ?F (36.9 ?C)-99.9 ?F (37.7 ?C)] 98.9 ?F (37.2 ?C) (03/07 0343) ?Pulse Rate:  [64-76] 64 (03/07 0343) ?Resp:  [16-35] 21 (03/07 0343) ?BP: (104-118)/(59-89) 104/67 (03/07 0343) ?SpO2:  [90 %-94 %] 92 % (03/07 0343) ?Last BM Date :  (PTA) ? ?Intake/Output from previous day: ?03/06 0701 - 03/07 0700 ?In: -  ?Out: 450 [Urine:450] ?Intake/Output this shift: ?No intake/output data recorded. ? ?General appearance: cooperative ?Resp: clear to auscultation bilaterally. CT on waterseal without leak ?Chest wall: antrerior SWs CDI with staples ?Cardio: regular rate and rhythm ?GI: soft. NT ?Extremities: calves soft ? ?Lab Results: ?CBC  ?Recent Labs  ?  05/26/21 ?0251  ?WBC 11.3*  ?HGB 13.0  ?HCT 42.0  ?PLT 173  ? ? ?BMET ?Recent Labs  ?  05/26/21 ?0439  ?NA 133*  ?K 3.9  ?CL 101  ?CO2 24  ?GLUCOSE 103*  ?BUN 11  ?CREATININE 0.89  ?CALCIUM 8.1*  ? ? ?PT/INR ?No results for input(s): LABPROT, INR in the last 72 hours. ? ?ABG ?No results for input(s): PHART, HCO3 in the last 72 hours. ? ?Invalid input(s): PCO2, PO2 ? ?Studies/Results: ?DG CHEST PORT 1 VIEW ? ?Result Date: 05/28/2021 ?CLINICAL DATA:  Right pneumothorax EXAM: PORTABLE CHEST 1 VIEW COMPARISON:  Yesterday FINDINGS: Right apical chest tube in place. Emphysema with apical bullae, greater on the right. Trace pneumothorax seen near the chest tube, resolved at the base. Retrocardiac infiltrate with streaky and reticulonodular density. IMPRESSION: 1. Stable chest tube positioning. Diminished right pneumothorax, only seen along the chest tube currently. 2. Retrocardiac infiltrate. 3. Bullous emphysema. Electronically Signed   By: Jorje Guild M.D.   On: 05/28/2021 06:44  ? ?DG CHEST PORT 1  VIEW ? ?Result Date: 05/27/2021 ?CLINICAL DATA:  56 year old male with history of pneumothorax. Right-sided chest tube. EXAM: PORTABLE CHEST 1 VIEW COMPARISON:  Chest x-ray 05/26/2021. FINDINGS: Right-sided chest tube in position with pigtail reformed in the upper right hemithorax. Interval reaccumulation of a small right pneumothorax. Trace left pleural effusion. No definite right pleural effusion. Patchy areas of interstitial prominence throughout the mid to lower lungs bilaterally, along with mild diffuse peribronchial cuffing. Extensive emphysema. No evidence of pulmonary edema. Heart size is normal. Upper mediastinal contours are within normal limits. Surgical clips project over the left hilar region. IMPRESSION: 1. Interval reaccumulation of small right pneumothorax. Right-sided chest tube is stable in position. 2. Patchy areas of interstitial prominence and peribronchial cuffing in the mid to lower lungs bilaterally, which may suggest developing bronchitis. 3. Emphysema. Electronically Signed   By: Vinnie Langton M.D.   On: 05/27/2021 07:46   ? ?Anti-infectives: ?Anti-infectives (From admission, onward)  ? ? Start     Dose/Rate Route Frequency Ordered Stop  ? 05/24/21 1615  ceFAZolin (ANCEF) IVPB 2g/100 mL premix       ? 2 g ?200 mL/hr over 30 Minutes Intravenous  Once 05/24/21 1612 05/24/21 1647  ? ?  ? ? ?Assessment/Plan: ?SW Chest X 2 - R PTX, chest tube to water seal 3/5, CXR 3/6 small lower PTX. CXR this am with diminished PTX. Remove today and recheck cxr pm (~1300) ?SW stapled ?Scalp lac - healing well. ?  ABL anemia ?AKI - resolved with resuscitation ?Neck pain R - MS, better on scheduled Robaxin ?ID - Ancef X 1 in ED ?FEN - regular ?VTE - Lovenox ?Dispo - remove CT. Pm cxr and possible dc today ? ?I reviewed last 24 h vitals and pain scores, last 48 h intake and output, last 24 h labs and trends, and last 24 h imaging results. ? ?This care required moderate level of medical decision making. ? ? ? LOS:  4 days  ? ?Winferd Humphrey, PA-C ?[facility] ?05/28/2021, 8:35 AM ?Please see Amion for pager number during day hours 7:00am-4:30pm ? ? ?05/28/2021  ?

## 2021-05-28 NOTE — Progress Notes (Signed)
Patient made decision to elope at 1152 on his own without this RN's knowledge. Patient told this nurse he removed his lines when nurse first went into patient room. Patient was given permission to walk as patient explained he just wanted to walk up and down the unit. This RN let the charge nurse and director know that the patient was missing and called down to security when we were informed that this patient had eloped and left the hospital.  ? ?Shelbie Proctor, RN ? ?

## 2021-05-28 NOTE — Progress Notes (Signed)
Patient found to be missing by primary RN Lu Duffel approximately 12:15 - earlier patient had asked PT if he could step outside for some air, to which they informed him he would need to discuss with his primary RN. Patient was last seen wearing two hospital gowns (one facing forward one facing backward) with paper scrub pants on. Appears patient removed his IV as it was found in the room. Dr. Bobbye Morton made aware. Security and [place], [facility] notified. All surrounding areas of the department searched and still unable to locate patient. ?

## 2021-05-29 ENCOUNTER — Encounter: Payer: Self-pay | Admitting: General Surgery
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
## 2021-09-04 ENCOUNTER — Inpatient Hospital Stay (HOSPITAL_COMMUNITY)
Admission: EM | Admit: 2021-09-04 | Discharge: 2021-09-08 | DRG: 871 | Discharge status: Against Medical Advice | Payer: Self-pay | Attending: Internal Medicine | Admitting: Internal Medicine

## 2021-09-04 ENCOUNTER — Emergency Department (HOSPITAL_COMMUNITY): Payer: Self-pay

## 2021-09-04 ENCOUNTER — Encounter (HOSPITAL_COMMUNITY): Payer: Self-pay | Admitting: Emergency Medicine

## 2021-09-04 ENCOUNTER — Other Ambulatory Visit: Payer: Self-pay

## 2021-09-04 DIAGNOSIS — D509 Iron deficiency anemia, unspecified: Secondary | ICD-10-CM | POA: Diagnosis present

## 2021-09-04 DIAGNOSIS — J90 Pleural effusion, not elsewhere classified: Secondary | ICD-10-CM | POA: Diagnosis present

## 2021-09-04 DIAGNOSIS — E440 Moderate protein-calorie malnutrition: Secondary | ICD-10-CM | POA: Insufficient documentation

## 2021-09-04 DIAGNOSIS — R109 Unspecified abdominal pain: Principal | ICD-10-CM

## 2021-09-04 DIAGNOSIS — J439 Emphysema, unspecified: Secondary | ICD-10-CM | POA: Diagnosis present

## 2021-09-04 DIAGNOSIS — K219 Gastro-esophageal reflux disease without esophagitis: Secondary | ICD-10-CM | POA: Diagnosis present

## 2021-09-04 DIAGNOSIS — J918 Pleural effusion in other conditions classified elsewhere: Secondary | ICD-10-CM | POA: Diagnosis present

## 2021-09-04 DIAGNOSIS — J160 Chlamydial pneumonia: Secondary | ICD-10-CM | POA: Diagnosis present

## 2021-09-04 DIAGNOSIS — A419 Sepsis, unspecified organism: Secondary | ICD-10-CM | POA: Diagnosis present

## 2021-09-04 DIAGNOSIS — J189 Pneumonia, unspecified organism: Secondary | ICD-10-CM | POA: Diagnosis present

## 2021-09-04 DIAGNOSIS — Z681 Body mass index (BMI) 19 or less, adult: Secondary | ICD-10-CM

## 2021-09-04 DIAGNOSIS — J449 Chronic obstructive pulmonary disease, unspecified: Secondary | ICD-10-CM | POA: Diagnosis present

## 2021-09-04 DIAGNOSIS — F17210 Nicotine dependence, cigarettes, uncomplicated: Secondary | ICD-10-CM | POA: Diagnosis present

## 2021-09-04 DIAGNOSIS — J9601 Acute respiratory failure with hypoxia: Secondary | ICD-10-CM | POA: Diagnosis present

## 2021-09-04 DIAGNOSIS — A4189 Other specified sepsis: Principal | ICD-10-CM | POA: Diagnosis present

## 2021-09-04 DIAGNOSIS — Z20822 Contact with and (suspected) exposure to covid-19: Secondary | ICD-10-CM | POA: Diagnosis present

## 2021-09-04 DIAGNOSIS — Z79899 Other long term (current) drug therapy: Secondary | ICD-10-CM

## 2021-09-04 LAB — COMPREHENSIVE METABOLIC PANEL
ALT: 8 U/L (ref 0–44)
AST: 14 U/L — ABNORMAL LOW (ref 15–41)
Albumin: 3.3 g/dL — ABNORMAL LOW (ref 3.5–5.0)
Alkaline Phosphatase: 99 U/L (ref 38–126)
Anion gap: 6 (ref 5–15)
BUN: 13 mg/dL (ref 6–20)
CO2: 25 mmol/L (ref 22–32)
Calcium: 8.5 mg/dL — ABNORMAL LOW (ref 8.9–10.3)
Chloride: 106 mmol/L (ref 98–111)
Creatinine, Ser: 0.68 mg/dL (ref 0.61–1.24)
GFR, Estimated: >60 mL/min (ref >60)
Glucose, Bld: 121 mg/dL — ABNORMAL HIGH (ref 70–99)
Potassium: 3.6 mmol/L (ref 3.5–5.1)
Sodium: 137 mmol/L (ref 135–145)
Total Bilirubin: 0.4 mg/dL (ref 0.3–1.2)
Total Protein: 7.2 g/dL (ref 6.5–8.1)

## 2021-09-04 LAB — URINALYSIS, ROUTINE W REFLEX MICROSCOPIC
Bacteria, UA: NONE SEEN
Bilirubin Urine: NEGATIVE
Glucose, UA: NEGATIVE mg/dL
Hgb urine dipstick: NEGATIVE
Ketones, ur: NEGATIVE mg/dL
Nitrite: NEGATIVE
Protein, ur: NEGATIVE mg/dL
Specific Gravity, Urine: 1.015 (ref 1.005–1.030)
pH: 5 (ref 5.0–8.0)

## 2021-09-04 LAB — CBC
HCT: 35.7 % — ABNORMAL LOW (ref 39.0–52.0)
Hemoglobin: 10.9 g/dL — ABNORMAL LOW (ref 13.0–17.0)
MCH: 24.5 pg — ABNORMAL LOW (ref 26.0–34.0)
MCHC: 30.5 g/dL (ref 30.0–36.0)
MCV: 80.2 fL (ref 80.0–100.0)
Platelets: 334 10*3/uL (ref 150–400)
RBC: 4.45 MIL/uL (ref 4.22–5.81)
RDW: 13.7 % (ref 11.5–15.5)
WBC: 11.7 10*3/uL — ABNORMAL HIGH (ref 4.0–10.5)
nRBC: 0 % (ref 0.0–0.2)

## 2021-09-04 LAB — TROPONIN I (HIGH SENSITIVITY): Troponin I (High Sensitivity): 2 ng/L (ref <18)

## 2021-09-04 LAB — SARS CORONAVIRUS 2 BY RT PCR: SARS Coronavirus 2 by RT PCR: NEGATIVE

## 2021-09-04 MED ORDER — ACETAMINOPHEN 500 MG PO TABS
1000.0000 mg | ORAL_TABLET | Freq: Once | ORAL | Status: AC
  Administered 2021-09-04: 1000 mg via ORAL
  Filled 2021-09-04: qty 2

## 2021-09-04 MED ORDER — IOHEXOL 350 MG/ML SOLN
100.0000 mL | Freq: Once | INTRAVENOUS | Status: AC | PRN
  Administered 2021-09-04: 100 mL via INTRAVENOUS

## 2021-09-04 MED ORDER — ACETAMINOPHEN 325 MG PO TABS: 650.0000 mg | ORAL_TABLET | Freq: Once | ORAL | Status: DC

## 2021-09-04 MED ORDER — DOXYCYCLINE HYCLATE 100 MG PO TABS
100.0000 mg | ORAL_TABLET | Freq: Once | ORAL | Status: AC
  Administered 2021-09-04: 100 mg via ORAL
  Filled 2021-09-04: qty 1

## 2021-09-04 MED ORDER — SODIUM CHLORIDE 0.9 % IV SOLN
1.0000 g | Freq: Once | INTRAVENOUS | Status: AC
  Administered 2021-09-04: 1 g via INTRAVENOUS
  Filled 2021-09-04: qty 10

## 2021-09-04 MED ORDER — MORPHINE SULFATE (PF) 4 MG/ML IV SOLN
4.0000 mg | Freq: Once | INTRAVENOUS | Status: AC
  Administered 2021-09-04: 4 mg via INTRAVENOUS
  Filled 2021-09-04 (×2): qty 1

## 2021-09-04 MED ORDER — SODIUM CHLORIDE 0.9 % IV SOLN: 500.0000 mg | Freq: Once | INTRAVENOUS | Status: DC

## 2021-09-04 MED ORDER — FENTANYL CITRATE PF 50 MCG/ML IJ SOSY
50.0000 ug | PREFILLED_SYRINGE | Freq: Once | INTRAMUSCULAR | Status: AC
  Filled 2021-09-04: qty 1

## 2021-09-04 MED ORDER — SODIUM CHLORIDE 0.9 % IV BOLUS
1000.0000 mL | Freq: Once | INTRAVENOUS | Status: AC
  Administered 2021-09-04: 1000 mL via INTRAVENOUS

## 2021-09-04 NOTE — ED Notes (Signed)
Pt refuses to walk. NT told him we need him to walk and pt shakes his head no and continues to lay in bed.
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
## 2021-09-04 NOTE — ED Triage Notes (Signed)
Pt reports flank pain x 1 week. Pt reports pain 10/10.

## 2021-09-04 NOTE — ED Notes (Signed)
Morphine held due to pt sleeping

## 2021-09-04 NOTE — ED Notes (Signed)
Fentanyl held at this time due to pt sleeping
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
## 2021-09-04 NOTE — ED Provider Notes (Signed)
56 year old gentleman presenting to ER for left-sided chest pain, left flank pain.  Patient had admission a couple months ago for right chest tube.  CXR demonstrating left lower lobe pneumonia, small left-sided pleural effusion.  Likely pneumonia, parapneumonic effusion.  Reassessed patient.  Still complaining of severe pain on the left side of his chest and his left abdomen/flank region.  Will give dose of pain medicine, check CT to rule out pulmonary embolism as well as a CT of his abdomen and pelvis.  Will start antibiotics for this possible pneumonia.  Will reassess.  CT is negative for any other pathology except for the known pneumonia and parapneumonic effusion.  Patient still having significant pain, gave some additional pain medicine, reassessed.  Asked RN staff to perform ambulation trial with pulse oximetry however patient refusing to walk.  Patient then noted to have fever to 101.4.  He is still is somewhat tachypneic.  Feel he would benefit from admission given his significant pain, tachypnea work of breathing and fever at this time.   Lucrezia Starch, MD 09/04/21 [phone]

## 2021-09-05 ENCOUNTER — Encounter (HOSPITAL_COMMUNITY): Payer: Self-pay | Admitting: Internal Medicine

## 2021-09-05 DIAGNOSIS — J189 Pneumonia, unspecified organism: Secondary | ICD-10-CM

## 2021-09-05 DIAGNOSIS — A419 Sepsis, unspecified organism: Secondary | ICD-10-CM | POA: Diagnosis present

## 2021-09-05 DIAGNOSIS — J449 Chronic obstructive pulmonary disease, unspecified: Secondary | ICD-10-CM | POA: Diagnosis present

## 2021-09-05 DIAGNOSIS — F17210 Nicotine dependence, cigarettes, uncomplicated: Secondary | ICD-10-CM | POA: Diagnosis present

## 2021-09-05 DIAGNOSIS — J160 Chlamydial pneumonia: Secondary | ICD-10-CM | POA: Diagnosis present

## 2021-09-05 DIAGNOSIS — K219 Gastro-esophageal reflux disease without esophagitis: Secondary | ICD-10-CM

## 2021-09-05 DIAGNOSIS — J90 Pleural effusion, not elsewhere classified: Secondary | ICD-10-CM | POA: Diagnosis present

## 2021-09-05 LAB — CBC WITH DIFFERENTIAL/PLATELET
Abs Immature Granulocytes: 0.05 10*3/uL (ref 0.00–0.07)
Basophils Absolute: 0 10*3/uL (ref 0.0–0.1)
Basophils Relative: 0 %
Eosinophils Absolute: 0 10*3/uL (ref 0.0–0.5)
Eosinophils Relative: 0 %
HCT: 38.7 % — ABNORMAL LOW (ref 39.0–52.0)
Hemoglobin: 12.1 g/dL — ABNORMAL LOW (ref 13.0–17.0)
Immature Granulocytes: 1 %
Lymphocytes Relative: 8 %
Lymphs Abs: 0.8 10*3/uL (ref 0.7–4.0)
MCH: 24.6 pg — ABNORMAL LOW (ref 26.0–34.0)
MCHC: 31.3 g/dL (ref 30.0–36.0)
MCV: 78.8 fL — ABNORMAL LOW (ref 80.0–100.0)
Monocytes Absolute: 0.5 10*3/uL (ref 0.1–1.0)
Monocytes Relative: 5 %
Neutro Abs: 9.2 10*3/uL — ABNORMAL HIGH (ref 1.7–7.7)
Neutrophils Relative %: 86 %
Platelets: 339 10*3/uL (ref 150–400)
RBC: 4.91 MIL/uL (ref 4.22–5.81)
RDW: 13.8 % (ref 11.5–15.5)
WBC: 10.7 10*3/uL — ABNORMAL HIGH (ref 4.0–10.5)
nRBC: 0 % (ref 0.0–0.2)

## 2021-09-05 LAB — COMPREHENSIVE METABOLIC PANEL
ALT: 9 U/L (ref 0–44)
AST: 13 U/L — ABNORMAL LOW (ref 15–41)
Albumin: 3.1 g/dL — ABNORMAL LOW (ref 3.5–5.0)
Alkaline Phosphatase: 103 U/L (ref 38–126)
Anion gap: 8 (ref 5–15)
BUN: 14 mg/dL (ref 6–20)
CO2: 21 mmol/L — ABNORMAL LOW (ref 22–32)
Calcium: 8.4 mg/dL — ABNORMAL LOW (ref 8.9–10.3)
Chloride: 108 mmol/L (ref 98–111)
Creatinine, Ser: 0.82 mg/dL (ref 0.61–1.24)
GFR, Estimated: >60 mL/min (ref >60)
Glucose, Bld: 75 mg/dL (ref 70–99)
Potassium: 3.7 mmol/L (ref 3.5–5.1)
Sodium: 137 mmol/L (ref 135–145)
Total Bilirubin: 0.9 mg/dL (ref 0.3–1.2)
Total Protein: 7.2 g/dL (ref 6.5–8.1)

## 2021-09-05 LAB — PROTIME-INR
INR: 1.3 — ABNORMAL HIGH (ref 0.8–1.2)
Prothrombin Time: 15.6 seconds — ABNORMAL HIGH (ref 11.4–15.2)

## 2021-09-05 LAB — APTT: aPTT: 32 seconds (ref 24–36)

## 2021-09-05 LAB — MAGNESIUM: Magnesium: 2 mg/dL (ref 1.7–2.4)

## 2021-09-05 LAB — STREP PNEUMONIAE URINARY ANTIGEN: Strep Pneumo Urinary Antigen: NEGATIVE

## 2021-09-05 MED ORDER — IPRATROPIUM-ALBUTEROL 0.5-2.5 (3) MG/3ML IN SOLN
3.0000 mL | RESPIRATORY_TRACT | Status: DC | PRN
  Administered 2021-09-05: 3 mL via RESPIRATORY_TRACT
  Filled 2021-09-05: qty 3

## 2021-09-05 MED ORDER — POLYETHYLENE GLYCOL 3350 17 G PO PACK: 17.0000 g | PACK | Freq: Every day | ORAL | Status: DC | PRN

## 2021-09-05 MED ORDER — SODIUM CHLORIDE 0.9 % IV BOLUS
1000.0000 mL | Freq: Once | INTRAVENOUS | Status: AC
  Administered 2021-09-05: 1000 mL via INTRAVENOUS

## 2021-09-05 MED ORDER — DM-GUAIFENESIN ER 30-600 MG PO TB12
1.0000 | ORAL_TABLET | Freq: Two times a day (BID) | ORAL | Status: DC
  Administered 2021-09-05 – 2021-09-07 (×6): 1 via ORAL
  Filled 2021-09-05 (×6): qty 1

## 2021-09-05 MED ORDER — SODIUM CHLORIDE 0.9 % IV SOLN
2.0000 g | INTRAVENOUS | Status: DC
  Administered 2021-09-05 – 2021-09-06 (×2): 2 g via INTRAVENOUS
  Filled 2021-09-05 (×2): qty 20

## 2021-09-05 MED ORDER — MORPHINE SULFATE (PF) 2 MG/ML IV SOLN
2.0000 mg | INTRAVENOUS | Status: DC | PRN
  Administered 2021-09-05 – 2021-09-06 (×4): 2 mg via INTRAVENOUS
  Filled 2021-09-05 (×4): qty 1

## 2021-09-05 MED ORDER — ENOXAPARIN SODIUM 40 MG/0.4ML IJ SOSY
40.0000 mg | PREFILLED_SYRINGE | INTRAMUSCULAR | Status: DC
  Administered 2021-09-05 – 2021-09-07 (×3): 40 mg via SUBCUTANEOUS
  Filled 2021-09-05 (×3): qty 0.4

## 2021-09-05 MED ORDER — ENSURE ENLIVE PO LIQD
237.0000 mL | Freq: Two times a day (BID) | ORAL | Status: DC
  Administered 2021-09-06 – 2021-09-07 (×4): 237 mL via ORAL

## 2021-09-05 MED ORDER — ONDANSETRON HCL 4 MG PO TABS: 4.0000 mg | ORAL_TABLET | Freq: Four times a day (QID) | ORAL | Status: DC | PRN

## 2021-09-05 MED ORDER — PANTOPRAZOLE SODIUM 40 MG PO TBEC
40.0000 mg | DELAYED_RELEASE_TABLET | Freq: Every day | ORAL | Status: DC
  Administered 2021-09-05 – 2021-09-07 (×3): 40 mg via ORAL
  Filled 2021-09-05 (×3): qty 1

## 2021-09-05 MED ORDER — ACETAMINOPHEN 650 MG RE SUPP: 650.0000 mg | Freq: Four times a day (QID) | RECTAL | Status: DC | PRN

## 2021-09-05 MED ORDER — OXYCODONE-ACETAMINOPHEN 5-325 MG PO TABS: 1.0000 | ORAL_TABLET | ORAL | Status: DC | PRN

## 2021-09-05 MED ORDER — SODIUM CHLORIDE 0.9 % IV SOLN: INTRAVENOUS | Status: AC

## 2021-09-05 MED ORDER — ONDANSETRON HCL 4 MG/2ML IJ SOLN: 4.0000 mg | Freq: Four times a day (QID) | INTRAMUSCULAR | Status: DC | PRN

## 2021-09-05 MED ORDER — ACETAMINOPHEN 325 MG PO TABS
650.0000 mg | ORAL_TABLET | Freq: Four times a day (QID) | ORAL | Status: DC | PRN
  Administered 2021-09-05: 650 mg via ORAL
  Filled 2021-09-05: qty 2

## 2021-09-05 MED ORDER — SODIUM CHLORIDE 0.9 % IV SOLN
100.0000 mg | Freq: Two times a day (BID) | INTRAVENOUS | Status: DC
  Administered 2021-09-05 – 2021-09-06 (×3): 100 mg via INTRAVENOUS
  Filled 2021-09-05 (×4): qty 100
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
## 2021-09-05 NOTE — Plan of Care (Signed)

## 2021-09-05 NOTE — ED Notes (Signed)
Gave patient an Publishing copy

## 2021-09-05 NOTE — Assessment & Plan Note (Signed)
Smoking cessation was discussed with patient in detail He declines a nicotine transdermal patch at this time 

## 2021-09-05 NOTE — Assessment & Plan Note (Signed)
   Patient exhibiting multiple SIRS criteria in the setting of left lower lobe pneumonia and parapneumonic effusion suggestive of developing sepsis

## 2021-09-05 NOTE — Assessment & Plan Note (Signed)
.   Patient presenting with cough, malaise and left chest wall/flank pain . Chest imaging revealing: Left lower lobe infiltrate with small parapneumonic effusion . In the absence of obvious empyema on CT continuing patient on regimen of intravenous ceftriaxone and doxycycline initiated by the emergency department staff.  . Providing supplemental oxygen for any associated hypoxia . Providing PRN bronchodilator therapy for any associated wheezing . Blood cultures obtained . Legionella and strep pneumoniae antigen testing additionally ordered . COVID-19 PCR testing negative

## 2021-09-05 NOTE — Assessment & Plan Note (Signed)
Continuing home regimen of daily PPI therapy.  

## 2021-09-05 NOTE — Progress Notes (Signed)
[facility] PLAN OF CARE NOTE Patient: Marvin Thomas EYE:[id number]   PCP: Pcp, No DOB: May 30, 1965   DOA: 09/04/2021   DOS: 09/05/2021    Patient was admitted by my colleague earlier on 09/05/2021. I have reviewed the H&P as well as assessment and plan and agree with the same. Important changes in the plan are listed below.  Plan of care: Principal Problem:   Pneumonia of left lower lobe due to infectious organism Active Problems:   Sepsis due to pneumonia ([place])   Pleural effusion on left   COPD (chronic obstructive pulmonary disease) (HCC)   GERD without esophagitis   Nicotine dependence, cigarettes, uncomplicated    Continue current Antibiotics and follow up on cultures. Add mucinex and flutter valve. May need hypertonic saline nebs   Author: Berle Mull, MD [facility] 09/05/2021 9:05 AM   If 7PM-7AM, please contact night-coverage at [email or web address]

## 2021-09-05 NOTE — Progress Notes (Signed)
   09/05/21 1915  Assess: MEWS Score  Temp 97.6 F (36.4 C)  BP 137/83  MAP (mmHg) 99  Pulse Rate 89  Resp (!) 30  SpO2 91 %  O2 Device Nasal Cannula  Assess: MEWS Score  MEWS Temp 0  MEWS Systolic 0  MEWS Pulse 0  MEWS RR 2  MEWS LOC 0  MEWS Score 2  MEWS Score Color Yellow  Assess: if the MEWS score is Yellow or Red  Were vital signs taken at a resting state? Yes  Focused Assessment No change from prior assessment  Does the patient meet 2 or more of the SIRS criteria? No  MEWS guidelines implemented *See Row Information* Yes  Take Vital Signs  Increase Vital Sign Frequency  Yellow: Q 2hr X 2 then Q 4hr X 2, if remains yellow, continue Q 4hrs  Escalate  MEWS: Escalate Yellow: discuss with charge nurse/RN and consider discussing with provider and RRT  Notify: Charge Nurse/RN  Name of Charge Nurse/RN Notified Tom RN  Date Charge Nurse/RN Notified 09/05/21  Notify: Provider  Provider Name/Title [place] NP  Date Provider Notified 09/05/21  Time Provider Notified 1945  Method of Notification  (secure chat)  Document  Patient Outcome Other (Comment) (remains on unit)  Assess: SIRS CRITERIA  SIRS Temperature  0  SIRS Pulse 0  SIRS Respirations  1  SIRS WBC 0  SIRS Score Sum  1

## 2021-09-05 NOTE — Assessment & Plan Note (Addendum)
   No obvious evidence of concurrent COPD exacerbation dyspnea  As needed bronchodilator therapy for shortness of breath and wheezing

## 2021-09-05 NOTE — Assessment & Plan Note (Addendum)
   Notable small left-sided parapneumonic effusion  No evidence of empyema on CT imaging  Considering the size and acuity the patient, pursuing a diagnostic thoracentesis in this situation is likely not worth the risk considering patient's known history of significant bullous emphysema  Managing concurrent pneumonia  If patient clinically worsens we will revisit the possibility of diagnostic/therapeutic thoracentesis

## 2021-09-06 ENCOUNTER — Inpatient Hospital Stay (HOSPITAL_COMMUNITY): Payer: Self-pay

## 2021-09-06 DIAGNOSIS — E440 Moderate protein-calorie malnutrition: Secondary | ICD-10-CM | POA: Insufficient documentation

## 2021-09-06 MED ORDER — ADULT MULTIVITAMIN W/MINERALS CH
1.0000 | ORAL_TABLET | Freq: Every day | ORAL | Status: DC
  Administered 2021-09-06 – 2021-09-07 (×2): 1 via ORAL
  Filled 2021-09-06 (×2): qty 1

## 2021-09-06 MED ORDER — IPRATROPIUM-ALBUTEROL 0.5-2.5 (3) MG/3ML IN SOLN
3.0000 mL | Freq: Two times a day (BID) | RESPIRATORY_TRACT | Status: DC
  Administered 2021-09-06 – 2021-09-07 (×4): 3 mL via RESPIRATORY_TRACT
  Filled 2021-09-06 (×4): qty 3

## 2021-09-06 MED ORDER — PREDNISONE 50 MG PO TABS
50.0000 mg | ORAL_TABLET | Freq: Every day | ORAL | Status: DC
  Administered 2021-09-07: 50 mg via ORAL
  Filled 2021-09-06: qty 1

## 2021-09-06 MED ORDER — METHYLPREDNISOLONE SODIUM SUCC 40 MG IJ SOLR
40.0000 mg | Freq: Once | INTRAMUSCULAR | Status: AC
  Administered 2021-09-06: 40 mg via INTRAVENOUS
  Filled 2021-09-06 (×2): qty 1

## 2021-09-06 MED ORDER — STERILE WATER FOR INJECTION IJ SOLN
INTRAMUSCULAR | Status: AC
  Administered 2021-09-06: 10 mL
  Filled 2021-09-06: qty 10

## 2021-09-06 NOTE — TOC Transition Note (Signed)
Transition of Care [facility]) - CM/SW Discharge Note   Patient Details  Name: Marvin Thomas MRN: [id number] Date of Birth: 11-04-1965  Transition of Care [facility]) CM/SW Contact:  Vassie Moselle, LCSW Phone Number: 09/06/2021, 10:27 AM   Clinical Narrative:    Met with patient who shared that he has been homeless off and on. Pt states he has been staying with friends at times. He reports his mother recently passed away and he has been staying in her house that he currently cares for. He states that he has had an orange card in the past however, he let is lapse and has not reapplied. CSW encouraged pt to work on reapplying for his orange card to assist with obtaining medication and medical services. Pt states he is aware of how to apply and will work on this post discharge. Pt states he does not have a PCP but, is open to being set up with PCP services. CSW referred pt to PCP however, they do not have any availability within the next 2 weeks. Referral has been sent to the clinical team and they will reach out to pt regarding scheduling a follow up appointment. TOC signing off at this time as there are no other TOC needs identified. Please re-consult TOC if additional needs arise.    Final next level of care: Home/Self Care Barriers to Discharge: No Barriers Identified   Patient Goals and CMS Choice Patient states their goals for this hospitalization and ongoing recovery are:: To return home   Choice offered to / list presented to : Patient  Discharge Placement                       Discharge Plan and Services In-house Referral: Clinical Social Work Discharge Planning Services: CM Consult            DME Arranged: N/A DME Agency: NA                  Social Determinants of Health (SDOH) Interventions     Readmission Risk Interventions    09/06/2021   10:24 AM  Readmission Risk Prevention Plan  Transportation Screening Complete  PCP or Specialist Appt within 3-5 Days  Complete  HRI or [place] Complete  Social Work Consult for [place] Planning/Counseling Complete  Palliative Care Screening Not Applicable  Medication Review Press photographer) Complete

## 2021-09-06 NOTE — Progress Notes (Signed)
IR was requested for image guided thoracentesis.   Patient had CTA chest on 6/14 which showed small to moderate size left pleural effusion. Ultrasound went to bedside to eval if patient has enough fluid for thoracentesis today, he refused to sit up. Pt states that he might consider dong the procedure on Monday.    Ordering provider notified.   Will delete the thoracentesis order.  New order will be placed by the primary team when patient is agreeable.   Please call IR for questions and concerns.   Armando Gang  PA-C 09/06/2021 3:49 PM

## 2021-09-06 NOTE — Plan of Care (Signed)
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
## 2021-09-06 NOTE — Hospital Course (Signed)
Past medical history of COPD with bullous emphysema, anemia of chronic disease, GERD, recent pneumothorax, active smoker. Present to the hospital with complaints of left-sided chest pain and flank pain. Found to have pneumonia and parapneumonic effusion along with acute hypoxic respiratory failure. Currently being treated with IV antibiotics and IV steroids. Patient refused thoracentesis.

## 2021-09-06 NOTE — Progress Notes (Signed)
  Progress Note Patient: Marvin Thomas:[id number] DOB: 1965/12/31 DOA: 09/04/2021  DOS: the patient was seen and examined on 09/06/2021  Brief hospital course: Past medical history of COPD with bullous emphysema, anemia of chronic disease, GERD, recent pneumothorax, active smoker. Present to the hospital with complaints of left-sided chest pain and flank pain. Found to have pneumonia and parapneumonic effusion along with acute hypoxic respiratory failure. Currently being treated with IV antibiotics and IV steroids. Patient refused thoracentesis.  Assessment and Plan: Sepsis due to community-acquired pneumonia. Parapneumonic effusion on left. Left-sided chest pain. History of COPD/emphysema with mild exacerbation. Presents with complaints of cough chest pain fatigue.  Has fever and tachypnea as well as soft blood pressure on admission meeting SIRS criteria. CT chest PE protocol negative for any PE but does show evidence of rounded airspace disease in the left subpleural space concerning for pneumonia along with small to moderate-sized left pleural effusion. Also shows evidence of mucoid impaction in the right lower lobe bronchus. Started on IV antibiotics.  Blood cultures so far negative. Follow-up on infectious work-up. COVID-negative. Thoracentesis was initially deferred due to history of bullous emphysema and pneumothorax. But due to ongoing chest pain, it was ordered after discussion with the patient although he is refusing to sit up due to pain therefore currently order is discontinued. Added IV steroids.  GERD. Continuing PPI.  Active smoker. Counseled the patient to quit smoking.  Microcytic anemia. MCV 78.  Hemoglobin around 11-12. Monitor for now. Check iron studies.  Subjective: Reports left-sided chest pain.  No nausea no vomiting.  No fever no chills.  Oral intake adequate.  Continues to have some cough as well as shortness of breath.  Physical Exam: Vitals:    09/06/21 0751 09/06/21 0903 09/06/21 1247 09/06/21 1713  BP:  122/76 123/72 126/71  Pulse:  72 78 78  Resp:  (!) 30 (!) 40 (!) 34  Temp:  98.7 F (37.1 C) 98.4 F (36.9 C) 99.2 F (37.3 C)  TempSrc:  Oral Oral Oral  SpO2: 94% 96% 92% 97%   General: Appear in moderate distress; no visible Abnormal Neck Mass Or lumps, Conjunctiva normal Cardiovascular: S1 and S2 Present, no Murmur, Respiratory: increased respiratory effort, Bilateral Air entry present and  bilateral  Crackles, no wheezes Abdomen: Bowel Sound present, Non tender  Extremities: no Pedal edema Neurology: alert and oriented to time, place, and person  Gait not checked due to patient safety concerns   Data Reviewed: I have ordered test including CBC and BMP and procalcitonin level I have discussed pt's care plan and test results with IR.   Family Communication: None at bedside  Disposition: Status is: Inpatient Remains inpatient appropriate because: Continue with IV antibiotics, will require paracentesis.  Added IV steroids.  Author: Berle Mull, MD 09/06/2021 6:23 PM  Please look on [email or web address] to find out who is on call.

## 2021-09-07 ENCOUNTER — Inpatient Hospital Stay (HOSPITAL_COMMUNITY): Payer: Self-pay

## 2021-09-07 LAB — COMPREHENSIVE METABOLIC PANEL
ALT: 8 U/L (ref 0–44)
AST: 10 U/L — ABNORMAL LOW (ref 15–41)
Albumin: 2.5 g/dL — ABNORMAL LOW (ref 3.5–5.0)
Alkaline Phosphatase: 72 U/L (ref 38–126)
Anion gap: 3 — ABNORMAL LOW (ref 5–15)
BUN: 13 mg/dL (ref 6–20)
CO2: 28 mmol/L (ref 22–32)
Calcium: 8.4 mg/dL — ABNORMAL LOW (ref 8.9–10.3)
Chloride: 106 mmol/L (ref 98–111)
Creatinine, Ser: 0.69 mg/dL (ref 0.61–1.24)
GFR, Estimated: >60 mL/min (ref >60)
Glucose, Bld: 108 mg/dL — ABNORMAL HIGH (ref 70–99)
Potassium: 4.4 mmol/L (ref 3.5–5.1)
Sodium: 137 mmol/L (ref 135–145)
Total Bilirubin: 0.6 mg/dL (ref 0.3–1.2)
Total Protein: 6.3 g/dL — ABNORMAL LOW (ref 6.5–8.1)

## 2021-09-07 LAB — CBC WITH DIFFERENTIAL/PLATELET
Abs Immature Granulocytes: 0.08 10*3/uL — ABNORMAL HIGH (ref 0.00–0.07)
Basophils Absolute: 0 10*3/uL (ref 0.0–0.1)
Basophils Relative: 0 %
Eosinophils Absolute: 0 10*3/uL (ref 0.0–0.5)
Eosinophils Relative: 0 %
HCT: 32.7 % — ABNORMAL LOW (ref 39.0–52.0)
Hemoglobin: 10.3 g/dL — ABNORMAL LOW (ref 13.0–17.0)
Immature Granulocytes: 1 %
Lymphocytes Relative: 7 %
Lymphs Abs: 0.9 10*3/uL (ref 0.7–4.0)
MCH: 24.4 pg — ABNORMAL LOW (ref 26.0–34.0)
MCHC: 31.5 g/dL (ref 30.0–36.0)
MCV: 77.5 fL — ABNORMAL LOW (ref 80.0–100.0)
Monocytes Absolute: 0.9 10*3/uL (ref 0.1–1.0)
Monocytes Relative: 7 %
Neutro Abs: 11.6 10*3/uL — ABNORMAL HIGH (ref 1.7–7.7)
Neutrophils Relative %: 85 %
Platelets: 320 10*3/uL (ref 150–400)
RBC: 4.22 MIL/uL (ref 4.22–5.81)
RDW: 13.3 % (ref 11.5–15.5)
WBC: 13.5 10*3/uL — ABNORMAL HIGH (ref 4.0–10.5)
nRBC: 0 % (ref 0.0–0.2)

## 2021-09-07 LAB — LACTATE DEHYDROGENASE: LDH: 80 U/L — ABNORMAL LOW (ref 98–192)

## 2021-09-07 LAB — MAGNESIUM: Magnesium: 2.1 mg/dL (ref 1.7–2.4)

## 2021-09-07 LAB — PROCALCITONIN: Procalcitonin: 0.89 ng/mL

## 2021-09-07 MED ORDER — CEFDINIR 300 MG PO CAPS
300.0000 mg | ORAL_CAPSULE | Freq: Two times a day (BID) | ORAL | Status: DC
  Administered 2021-09-07 (×2): 300 mg via ORAL
  Filled 2021-09-07 (×2): qty 1

## 2021-09-07 MED ORDER — DOXYCYCLINE HYCLATE 100 MG PO TABS
100.0000 mg | ORAL_TABLET | Freq: Two times a day (BID) | ORAL | Status: DC
  Administered 2021-09-07 (×2): 100 mg via ORAL
  Filled 2021-09-07 (×2): qty 1
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
## 2021-09-07 NOTE — Progress Notes (Signed)
SATURATION QUALIFICATIONS: (This note is used to comply with regulatory documentation for home oxygen)  Patient Saturations on Room Air at Rest = 92%  Patient Saturations on Room Air while Ambulating = 94%  Patient Saturations on 0 Liters of oxygen while Ambulating = 94%  Please briefly explain why patient needs home oxygen:

## 2021-09-08 NOTE — Progress Notes (Addendum)
Patient came to this nurse and wanting to leave the hospital now. This nurse educated the patient that he is not medically ready for discharge at this point and educated the need to complete his stay in order to get medically cleared. Patient verbalized understanding. Pt. Signed AMA form, IV was removed. On-call NP and [facility] notified

## 2021-09-09 LAB — LEGIONELLA PNEUMOPHILA SEROGP 1 UR AG: L. pneumophila Serogp 1 Ur Ag: NEGATIVE

## 2021-09-10 LAB — CULTURE, BLOOD (ROUTINE X 2)
Culture: NO GROWTH
Culture: NO GROWTH
Special Requests: ADEQUATE
Special Requests: ADEQUATE
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
## 2022-10-16 ENCOUNTER — Other Ambulatory Visit: Payer: Self-pay

## 2022-10-16 ENCOUNTER — Observation Stay (HOSPITAL_COMMUNITY): Payer: No Typology Code available for payment source

## 2022-10-16 ENCOUNTER — Inpatient Hospital Stay (HOSPITAL_COMMUNITY)
Admission: EM | Admit: 2022-10-16 | Discharge: 2022-10-24 | DRG: 378 | Disposition: A | Discharge status: Home / Self Care | Payer: No Typology Code available for payment source | Attending: Internal Medicine | Admitting: Internal Medicine

## 2022-10-16 ENCOUNTER — Emergency Department (HOSPITAL_COMMUNITY): Payer: No Typology Code available for payment source

## 2022-10-16 DIAGNOSIS — K769 Liver disease, unspecified: Principal | ICD-10-CM

## 2022-10-16 DIAGNOSIS — Z539 Procedure and treatment not carried out, unspecified reason: Secondary | ICD-10-CM | POA: Diagnosis present

## 2022-10-16 DIAGNOSIS — K2971 Gastritis, unspecified, with bleeding: Secondary | ICD-10-CM | POA: Diagnosis present

## 2022-10-16 DIAGNOSIS — K922 Gastrointestinal hemorrhage, unspecified: Secondary | ICD-10-CM

## 2022-10-16 DIAGNOSIS — D125 Benign neoplasm of sigmoid colon: Secondary | ICD-10-CM | POA: Diagnosis present

## 2022-10-16 DIAGNOSIS — K5669 Other partial intestinal obstruction: Secondary | ICD-10-CM | POA: Diagnosis present

## 2022-10-16 DIAGNOSIS — Z681 Body mass index (BMI) 19 or less, adult: Secondary | ICD-10-CM

## 2022-10-16 DIAGNOSIS — R001 Bradycardia, unspecified: Secondary | ICD-10-CM

## 2022-10-16 DIAGNOSIS — C787 Secondary malignant neoplasm of liver and intrahepatic bile duct: Secondary | ICD-10-CM | POA: Diagnosis present

## 2022-10-16 DIAGNOSIS — K219 Gastro-esophageal reflux disease without esophagitis: Secondary | ICD-10-CM | POA: Diagnosis present

## 2022-10-16 DIAGNOSIS — E440 Moderate protein-calorie malnutrition: Secondary | ICD-10-CM | POA: Insufficient documentation

## 2022-10-16 DIAGNOSIS — D62 Acute posthemorrhagic anemia: Secondary | ICD-10-CM | POA: Diagnosis not present

## 2022-10-16 DIAGNOSIS — J438 Other emphysema: Secondary | ICD-10-CM | POA: Diagnosis present

## 2022-10-16 DIAGNOSIS — D123 Benign neoplasm of transverse colon: Secondary | ICD-10-CM | POA: Diagnosis present

## 2022-10-16 DIAGNOSIS — J449 Chronic obstructive pulmonary disease, unspecified: Secondary | ICD-10-CM | POA: Diagnosis present

## 2022-10-16 DIAGNOSIS — R8290 Unspecified abnormal findings in urine: Secondary | ICD-10-CM | POA: Diagnosis present

## 2022-10-16 DIAGNOSIS — R110 Nausea: Secondary | ICD-10-CM

## 2022-10-16 DIAGNOSIS — C180 Malignant neoplasm of cecum: Secondary | ICD-10-CM | POA: Diagnosis present

## 2022-10-16 DIAGNOSIS — K256 Chronic or unspecified gastric ulcer with both hemorrhage and perforation: Secondary | ICD-10-CM | POA: Diagnosis present

## 2022-10-16 DIAGNOSIS — D649 Anemia, unspecified: Secondary | ICD-10-CM | POA: Insufficient documentation

## 2022-10-16 DIAGNOSIS — K264 Chronic or unspecified duodenal ulcer with hemorrhage: Principal | ICD-10-CM | POA: Diagnosis present

## 2022-10-16 DIAGNOSIS — Z8249 Family history of ischemic heart disease and other diseases of the circulatory system: Secondary | ICD-10-CM

## 2022-10-16 DIAGNOSIS — R8281 Pyuria: Secondary | ICD-10-CM | POA: Diagnosis present

## 2022-10-16 DIAGNOSIS — Z833 Family history of diabetes mellitus: Secondary | ICD-10-CM

## 2022-10-16 DIAGNOSIS — N179 Acute kidney failure, unspecified: Secondary | ICD-10-CM

## 2022-10-16 DIAGNOSIS — E860 Dehydration: Secondary | ICD-10-CM | POA: Diagnosis present

## 2022-10-16 DIAGNOSIS — R109 Unspecified abdominal pain: Secondary | ICD-10-CM | POA: Diagnosis present

## 2022-10-16 DIAGNOSIS — K648 Other hemorrhoids: Secondary | ICD-10-CM | POA: Diagnosis present

## 2022-10-16 DIAGNOSIS — F1210 Cannabis abuse, uncomplicated: Secondary | ICD-10-CM | POA: Diagnosis present

## 2022-10-16 DIAGNOSIS — F17210 Nicotine dependence, cigarettes, uncomplicated: Secondary | ICD-10-CM | POA: Diagnosis present

## 2022-10-16 DIAGNOSIS — R160 Hepatomegaly, not elsewhere classified: Secondary | ICD-10-CM | POA: Diagnosis present

## 2022-10-16 DIAGNOSIS — K824 Cholesterolosis of gallbladder: Secondary | ICD-10-CM | POA: Diagnosis present

## 2022-10-16 DIAGNOSIS — J432 Centrilobular emphysema: Secondary | ICD-10-CM | POA: Diagnosis present

## 2022-10-16 DIAGNOSIS — I10 Essential (primary) hypertension: Secondary | ICD-10-CM | POA: Diagnosis present

## 2022-10-16 DIAGNOSIS — B9681 Helicobacter pylori [H. pylori] as the cause of diseases classified elsewhere: Secondary | ICD-10-CM | POA: Diagnosis present

## 2022-10-16 DIAGNOSIS — D509 Iron deficiency anemia, unspecified: Secondary | ICD-10-CM | POA: Diagnosis present

## 2022-10-16 DIAGNOSIS — E871 Hypo-osmolality and hyponatremia: Secondary | ICD-10-CM | POA: Diagnosis present

## 2022-10-16 DIAGNOSIS — K2981 Duodenitis with bleeding: Secondary | ICD-10-CM | POA: Diagnosis present

## 2022-10-16 LAB — COMPREHENSIVE METABOLIC PANEL
ALT: 13 U/L (ref 0–44)
AST: 17 U/L (ref 15–41)
Albumin: 4.1 g/dL (ref 3.5–5.0)
Alkaline Phosphatase: 97 U/L (ref 38–126)
Anion gap: 10 (ref 5–15)
BUN: 36 mg/dL — ABNORMAL HIGH (ref 6–20)
CO2: 22 mmol/L (ref 22–32)
Calcium: 9 mg/dL (ref 8.9–10.3)
Chloride: 101 mmol/L (ref 98–111)
Creatinine, Ser: 1.47 mg/dL — ABNORMAL HIGH (ref 0.61–1.24)
GFR, Estimated: 55 mL/min — ABNORMAL LOW (ref >60)
Glucose, Bld: 92 mg/dL (ref 70–99)
Potassium: 3.8 mmol/L (ref 3.5–5.1)
Sodium: 133 mmol/L — ABNORMAL LOW (ref 135–145)
Total Bilirubin: 0.8 mg/dL (ref 0.3–1.2)
Total Protein: 8.1 g/dL (ref 6.5–8.1)

## 2022-10-16 LAB — TYPE AND SCREEN
ABO/RH(D): A POS
Antibody Screen: NEGATIVE

## 2022-10-16 LAB — CBC WITH DIFFERENTIAL/PLATELET
Abs Immature Granulocytes: 0.01 10*3/uL (ref 0.00–0.07)
Basophils Absolute: 0 10*3/uL (ref 0.0–0.1)
Basophils Relative: 1 %
Eosinophils Absolute: 0 10*3/uL (ref 0.0–0.5)
Eosinophils Relative: 1 %
HCT: 29.8 % — ABNORMAL LOW (ref 39.0–52.0)
Hemoglobin: 8.9 g/dL — ABNORMAL LOW (ref 13.0–17.0)
Immature Granulocytes: 0 %
Lymphocytes Relative: 18 %
Lymphs Abs: 1.1 10*3/uL (ref 0.7–4.0)
MCH: 20.9 pg — ABNORMAL LOW (ref 26.0–34.0)
MCHC: 29.9 g/dL — ABNORMAL LOW (ref 30.0–36.0)
MCV: 70 fL — ABNORMAL LOW (ref 80.0–100.0)
Monocytes Absolute: 0.4 10*3/uL (ref 0.1–1.0)
Monocytes Relative: 6 %
Neutro Abs: 4.6 10*3/uL (ref 1.7–7.7)
Neutrophils Relative %: 74 %
Platelets: 434 10*3/uL — ABNORMAL HIGH (ref 150–400)
RBC: 4.26 MIL/uL (ref 4.22–5.81)
RDW: 17 % — ABNORMAL HIGH (ref 11.5–15.5)
WBC: 6.1 10*3/uL (ref 4.0–10.5)
nRBC: 0 % (ref 0.0–0.2)

## 2022-10-16 LAB — URINALYSIS, ROUTINE W REFLEX MICROSCOPIC
Bilirubin Urine: NEGATIVE
Glucose, UA: NEGATIVE mg/dL
Hgb urine dipstick: NEGATIVE
Ketones, ur: 5 mg/dL — AB
Nitrite: NEGATIVE
Protein, ur: 30 mg/dL — AB
Specific Gravity, Urine: 1.027 (ref 1.005–1.030)
pH: 5 (ref 5.0–8.0)

## 2022-10-16 LAB — LIPASE, BLOOD: Lipase: 30 U/L (ref 11–51)

## 2022-10-16 LAB — ABO/RH

## 2022-10-16 LAB — POC OCCULT BLOOD, ED: Fecal Occult Bld: POSITIVE — AB

## 2022-10-16 MED ORDER — NALOXONE HCL 0.4 MG/ML IJ SOLN: 0.4000 mg | INTRAMUSCULAR | Status: DC | PRN

## 2022-10-16 MED ORDER — SODIUM CHLORIDE 0.9 % IV SOLN: INTRAVENOUS | Status: DC

## 2022-10-16 MED ORDER — ACETAMINOPHEN 650 MG RE SUPP: 650.0000 mg | Freq: Four times a day (QID) | RECTAL | Status: DC | PRN

## 2022-10-16 MED ORDER — ONDANSETRON HCL 4 MG/2ML IJ SOLN
4.0000 mg | Freq: Four times a day (QID) | INTRAMUSCULAR | Status: DC | PRN
  Administered 2022-10-17 (×2): 4 mg via INTRAVENOUS
  Filled 2022-10-16 (×2): qty 2

## 2022-10-16 MED ORDER — SODIUM CHLORIDE 0.9 % IV BOLUS
1000.0000 mL | Freq: Once | INTRAVENOUS | Status: AC
  Administered 2022-10-16: 1000 mL via INTRAVENOUS

## 2022-10-16 MED ORDER — ACETAMINOPHEN 325 MG PO TABS
650.0000 mg | ORAL_TABLET | Freq: Four times a day (QID) | ORAL | Status: DC | PRN
  Administered 2022-10-18: 650 mg via ORAL
  Filled 2022-10-16 (×2): qty 2

## 2022-10-16 MED ORDER — HYDROMORPHONE HCL 1 MG/ML IJ SOLN
1.0000 mg | INTRAMUSCULAR | Status: DC | PRN
  Administered 2022-10-16 – 2022-10-22 (×6): 1 mg via INTRAVENOUS
  Filled 2022-10-16 (×8): qty 1

## 2022-10-16 MED ORDER — MORPHINE SULFATE (PF) 4 MG/ML IV SOLN
4.0000 mg | Freq: Once | INTRAVENOUS | Status: AC
  Administered 2022-10-16: 4 mg via INTRAVENOUS
  Filled 2022-10-16: qty 1

## 2022-10-16 MED ORDER — PANTOPRAZOLE SODIUM 40 MG IV SOLR
40.0000 mg | Freq: Two times a day (BID) | INTRAVENOUS | Status: DC
  Administered 2022-10-16 – 2022-10-24 (×16): 40 mg via INTRAVENOUS
  Filled 2022-10-16 (×16): qty 10

## 2022-10-16 MED ORDER — HYDROMORPHONE HCL 1 MG/ML IJ SOLN
0.5000 mg | Freq: Once | INTRAMUSCULAR | Status: AC
  Administered 2022-10-17: 0.5 mg via INTRAVENOUS
  Filled 2022-10-16: qty 0.5

## 2022-10-16 MED ORDER — ONDANSETRON HCL 4 MG/2ML IJ SOLN
4.0000 mg | Freq: Once | INTRAMUSCULAR | Status: AC
  Administered 2022-10-16: 4 mg via INTRAVENOUS
  Filled 2022-10-16: qty 2

## 2022-10-16 MED ORDER — GADOBUTROL 1 MMOL/ML IV SOLN
6.0000 mL | Freq: Once | INTRAVENOUS | Status: AC | PRN
  Administered 2022-10-16: 6 mL via INTRAVENOUS

## 2022-10-16 NOTE — H&P (Signed)
History and Physical    ASAF ELMQUIST ZOX:[id number] DOB: 1965/05/02 DOA: 10/16/2022  PCP: Pcp, No  Patient coming from: Home  Chief Complaint: Abdominal pain  HPI: Marvin Thomas is a 57 y.o. male with medical history significant of COPD, chronic anemia, gallstones, hemothorax, GERD, PUD/perforated pyloric ulcer status post repair in 2019, substance abuse (marijuana, tobacco) presented to ED with complaint of right upper quadrant abdominal pain worse with eating or drinking.  Afebrile.  Slightly bradycardic with heart rate in the 50s to 60s.  Blood pressure elevated with systolic in the 150s to 160s.  Labs showing no leukocytosis, hemoglobin 8.9 (was previously 10.3-12.1 a year ago in June 2023), MCV 70.0, platelet count 434k, sodium 133, glucose 92, BUN 36, creatinine 1.4 (previously 0.6-0.8 on labs a year ago), normal lipase and LFTs.  UA with negative nitrite, moderate leukocytes, and microscopy showing 21-50 WBCs and few bacteria.  FOBT positive but no melena or hematochezia noted on rectal exam done by EDP.  Right upper quadrant abdominal ultrasound negative for cholecystitis but showing heterogeneous liver with ill-defined hyperechoic lesions and a single sub-4 mm low risk gallbladder polyp. EDP consulted [facility] (Dr. Dulce Sellar) and liver MRI ordered.  No urine detected on bladder scan.  Patient was given 1 L normal saline and was also given morphine and Zofran in the ED.  TR[facility] called to admit.    Patient is reporting severe epigastric/right upper quadrant abdominal pain x 4 days associated with nausea but no vomiting.  He reports black stool 2 days ago but no further bowel movements since then.  States his symptoms started after he ate hamburgers made by his roommate.  He is reporting severe 10 out of 10 intensity pain despite receiving morphine 4 mg x 2 in the ED.  Also reporting unintentional weight loss but does not know how much weight he has lost.  No other complaints.  Review of  Systems:  Review of Systems  All other systems reviewed and are negative.   Past Medical History:  Diagnosis Date   Anemia Dx 2014   Gallstones    Hemothorax    Ulcer     Past Surgical History:  Procedure Laterality Date   LAPAROTOMY N/A 12/09/2017   Procedure: EXPLORATORY LAPAROTOMY, REPAIR PERFORATED PYLEORIC ULCER;  Surgeon: Almond Lint, MD;  Location: [facility];  Service: General;  Laterality: N/A;     reports that he has been smoking cigarettes. He has a 46.5 pack-year smoking history. He uses smokeless tobacco. He reports current alcohol use. He reports current drug use. Drug: Marijuana.  Allergies  Allergen Reactions   Aspirin Nausea Only    Family History  Problem Relation Age of Onset   Diabetes Mother    Hypertension Mother    Heart disease Mother        x 3    Heart disease Brother    Hypertension Brother    Diabetes Other    Hypertension Brother    Hypertension Brother    Cancer Neg Hx     Prior to Admission medications   Medication Sig Start Date End Date Taking? Authorizing Provider  pantoprazole (PROTONIX) 20 MG tablet Take 2 tablets (40 mg total) by mouth daily. Patient not taking: Reported on 11/11/2020 12/13/17   Glenna Fellows, MD    Physical Exam: Vitals:   10/16/22 1401 10/16/22 1630 10/16/22 1638 10/16/22 1700  BP:  (!) 163/94  (!) 158/74  Pulse: 65 67  (!) 56  Resp: 19 (!)  21  18  Temp:   97.7 F (36.5 C)   TempSrc:   Oral   SpO2: 92% 100%  100%    Physical Exam Vitals reviewed.  Constitutional:      Appearance: He is not diaphoretic.  HENT:     Head: Normocephalic and atraumatic.  Eyes:     Extraocular Movements: Extraocular movements intact.  Cardiovascular:     Rate and Rhythm: Normal rate and regular rhythm.     Pulses: Normal pulses.  Pulmonary:     Effort: Pulmonary effort is normal. No respiratory distress.     Breath sounds: Normal breath sounds. No wheezing or rales.  Abdominal:     General: Bowel sounds are  normal. There is no distension.     Tenderness: There is abdominal tenderness. There is guarding.     Comments: Epigastrium and right upper quadrant tender to palpation  Musculoskeletal:     Cervical back: Normal range of motion.     Right lower leg: No edema.     Left lower leg: No edema.  Skin:    General: Skin is warm and dry.  Neurological:     General: No focal deficit present.     Mental Status: He is alert and oriented to person, place, and time.  Psychiatric:     Comments: Appears agitated     Labs on Admission: I have personally reviewed following labs and imaging studies  CBC: Recent Labs  Lab 10/16/22 1338  WBC 6.1  NEUTROABS 4.6  HGB 8.9*  HCT 29.8*  MCV 70.0*  PLT 434*   Basic Metabolic Panel: Recent Labs  Lab 10/16/22 1338  NA 133*  K 3.8  CL 101  CO2 22  GLUCOSE 92  BUN 36*  CREATININE 1.47*  CALCIUM 9.0   GFR: CrCl cannot be calculated (Unknown ideal weight.). Liver Function Tests: Recent Labs  Lab 10/16/22 1338  AST 17  ALT 13  ALKPHOS 97  BILITOT 0.8  PROT 8.1  ALBUMIN 4.1   Recent Labs  Lab 10/16/22 1338  LIPASE 30   No results for input(s): "AMMONIA" in the last 168 hours. Coagulation Profile: No results for input(s): "INR", "PROTIME" in the last 168 hours. Cardiac Enzymes: No results for input(s): "CKTOTAL", "CKMB", "CKMBINDEX", "TROPONINI" in the last 168 hours. BNP (last 3 results) No results for input(s): "PROBNP" in the last 8760 hours. HbA1C: No results for input(s): "HGBA1C" in the last 72 hours. CBG: No results for input(s): "GLUCAP" in the last 168 hours. Lipid Profile: No results for input(s): "CHOL", "HDL", "LDLCALC", "TRIG", "CHOLHDL", "LDLDIRECT" in the last 72 hours. Thyroid Function Tests: No results for input(s): "TSH", "T4TOTAL", "FREET4", "T3FREE", "THYROIDAB" in the last 72 hours. Anemia Panel: No results for input(s): "VITAMINB12", "FOLATE", "FERRITIN", "TIBC", "IRON", "RETICCTPCT" in the last 72  hours. Urine analysis:    Component Value Date/Time   COLORURINE YELLOW 10/16/2022 1637   APPEARANCEUR HAZY (A) 10/16/2022 1637   LABSPEC 1.027 10/16/2022 1637   PHURINE 5.0 10/16/2022 1637   GLUCOSEU NEGATIVE 10/16/2022 1637   HGBUR NEGATIVE 10/16/2022 1637   BILIRUBINUR NEGATIVE 10/16/2022 1637   KETONESUR 5 (A) 10/16/2022 1637   PROTEINUR 30 (A) 10/16/2022 1637   UROBILINOGEN 1.0 11/29/2014 1601   NITRITE NEGATIVE 10/16/2022 1637   LEUKOCYTESUR MODERATE (A) 10/16/2022 1637    Radiological Exams on Admission: US Abdomen Limited  Result Date: 10/16/2022 CLINICAL DATA:  RUQ pain EXAM: ULTRASOUND ABDOMEN LIMITED RIGHT UPPER QUADRANT COMPARISON:  CT scan abdomen and pelvis  from 09/04/2021. FINDINGS: Gallbladder: Physiologically distended. There is a sub 4 mm sessile gallbladder polyp. No gallstones or abnormal wall thickening. No pericholecystic free fluid. No sonographic Murphy sign noted by sonographer. Common bile duct: Diameter: Less than 4 mm. Liver: No focal lesion identified. Within normal limits in parenchymal echogenicity. Liver echotexture is within normal limits. However, there is heterogeneous echogenicity with ill-defined interspersed Iso and hyperechoic areas within the technologist measured at least. 2 ill-defined hyperechoic lesions measured 4.2 x 5.0 x 5.2 cm and 1.9 x 1.9 x 2.6 cm. These are incompletely imaged and characterized on the current examination. Further evaluation with multiphasic contrast-enhanced MRI abdomen as per liver mass protocol is recommended. Portal vein is patent on color Doppler imaging with normal direction of blood flow towards the liver. Other: None. IMPRESSION: 1. No evidence of cholecystitis. 2. Heterogeneous liver with ill-defined hyperechoic lesions. Further evaluation with multiphasic contrast-enhanced MRI abdomen as per liver mass protocol is recommended. 3. There is a single sub 4 mm, low risk gallbladder polyp. Please see follow-up  recommendations below. Management of Incidentally Detected Gallbladder Polyps: Society of Radiologists in Ultrasound Consensus Conference Recommendations (published online September 25, 2020): [email or web address] Extremely low-risk polyps, i.e. pedunculated ball-on-the-wall or thin stalk: <9 mm: no follow-up 10-14 mm: follow-up at 6, 12 and 24 months >15 mm: surgical consult Low-risk polyps, i.e. pedunculated with a thick or wide stalk, or sessile: ?6 mm: no follow-up 7-9 mm follow-up ultrasound at 12 months 10-14 mm: follow-up ultrasound at 6, 12, 24, and 36 months vs surgical consult >15 mm: surgical consult Intermediate risk: focal wall-thickening >27mm adjacent to polyp: <6 mm: follow-up at 6, 12, 24, 36 months vs surgical consult >7 mm: surgical consult Electronically Signed   By: Jules Schick M.D.   On: 10/16/2022 15:32    EKG: Ordered and currently pending.  Assessment and Plan  Epigastric/right upper quadrant abdominal pain, nausea Acute on chronic anemia secondary to suspected blood loss/concern for upper GI bleed No fever or leukocytosis.  Normal lipase and LFTs.  Given patient's history of PUD/perforated pyloric ulcer requiring surgical repair in the past, stat CT abdomen pelvis was done which is showing normal stomach but subtle ill-defined hypodense areas within the liver concerning for focal fatty infiltration or masses.  Right upper quadrant abdominal ultrasound negative for cholecystitis but showing heterogeneous liver with ill-defined hyperechoic lesions and a single sub-4 mm low risk gallbladder polyp.  Patient is reporting melena 2 days ago.  Hemoglobin 8.9 with MCV 70.0.  Hemoglobin was previously 10.3-12.1 on labs done a year ago in June 2023. FOBT positive but no melena or hematochezia noted on rectal exam done by EDP.  No prior EGD results in the chart.  [facility] will consult in the morning.  MRI of liver ordered for further evaluation and currently pending.  Keep n.p.o.  at this time, IV fluid hydration, IV Dilaudid as needed for severe pain, and start IV Protonix 40 mg every 12 hours.  IV antiemetic as needed (EKG ordered to check QT interval).  Type and screen, monitor CBC.  AKI Likely prerenal from dehydration. BUN 36, creatinine 1.4 (previously 0.6-0.8 on labs a year ago).  Continue IV fluid hydration and monitor urine output and renal function closely.  Avoid nephrotoxic agents.  Bradycardia Heart rate currently in the 50s, no hypotension.  He is not on any AV nodal blocking agents.  He was last admitted to the hospital a year ago and at that time his heart rate was in the  60s to 80s.  Stat EKG ordered and currently pending.  Elevated blood pressure readings Systolic in the 150s to 160s.  No documented history of hypertension.  Continue pain management and monitor blood pressure closely.  Mild hyponatremia Continue IV fluid hydration with normal saline and monitor labs.  Abnormal urinalysis UA with negative nitrite, moderate leukocytes, and microscopy showing 21-50 WBCs and few bacteria.  Patient is not endorsing any urinary symptoms.  No fever or leukocytosis.  Urine culture ordered.  COPD Stable, no signs of acute exacerbation.  Not on any inhalers at home.  DVT prophylaxis: SCDs Code Status: Full Code (discussed with the patient) Family Communication: No family available at this time. Consults called: [facility] Level of care: Telemetry bed Admission status: It is my clinical opinion that referral for OBSERVATION is reasonable and necessary in this patient based on the above information provided. The aforementioned taken together are felt to place the patient at high risk for further clinical deterioration. However, it is anticipated that the patient may be medically stable for discharge from the hospital within 24 to 48 hours.   John Giovanni MD [facility]  If 7PM-7AM, please contact night-coverage [email or web address]  10/16/2022, 6:27 PM

## 2022-10-16 NOTE — ED Triage Notes (Signed)
BIBA from home for abd pain x 4 days, n/v, urinary retention x 4 days. 140/70 BP 64 HR 100% room air 97 cbg

## 2022-10-16 NOTE — ED Notes (Signed)
Bladder scan done, PA Silverio Lay was in pt room during bladder scan.

## 2022-10-16 NOTE — ED Notes (Signed)
Bladder scanner unable to determine

## 2022-10-16 NOTE — ED Provider Notes (Signed)
[place] EMERGENCY DEPARTMENT AT [facility] Provider Note   CSN: [id number] Arrival date & time: 10/16/22  1323     History  Chief Complaint  Patient presents with   Abdominal Pain   Emesis    Marvin Thomas is a 57 y.o. male.  The history is provided by the patient and medical records. No language interpreter was used.  Abdominal Pain Associated symptoms: vomiting   Emesis Associated symptoms: abdominal pain      57 year old male significant history of cholelithiasis, PUD, perforated ulcer, COPD, anemia, presenting to the ED via EMS from home with complaint of abdominal pain.  Patient report for the past 4 days he has had recurrent abdominal discomfort.  He described pain as a sharp cramping sensation to his right upper quadrant worsening with eating or drinking.  This all started shortly after he ate a burger that was given to him by his roommate.  He states he having been eating or drinking much and not making much urine.  Denies any burning sensation when urinate other urge to urinate.  Does not endorse fever or chills no chest pain or shortness of shortness of breath but states his pain is moderate in intensity.  He has not had a bowel movement recently but able to pass flatus.  Admits to tobacco use and smoked approximately a pack of cigarettes daily but denies alcohol use.  No history of diabetes.  Home Medications Prior to Admission medications   Medication Sig Start Date End Date Taking? Authorizing Provider  pantoprazole (PROTONIX) 20 MG tablet Take 2 tablets (40 mg total) by mouth daily. Patient not taking: Reported on 11/11/2020 12/13/17   Glenna Fellows, MD      Allergies    Aspirin    Review of Systems   Review of Systems  Gastrointestinal:  Positive for abdominal pain and vomiting.  All other systems reviewed and are negative.   Physical Exam Updated Vital Signs BP (!) 161/69   Pulse (!) 51   Temp (!) 97.4 F (36.3 C) (Oral)   Resp 17    SpO2 100%  Physical Exam Vitals and nursing note reviewed.  Constitutional:      General: He is not in acute distress.    Appearance: He is well-developed.  HENT:     Head: Atraumatic.  Eyes:     Conjunctiva/sclera: Conjunctivae normal.  Cardiovascular:     Rate and Rhythm: Bradycardia present.  Pulmonary:     Effort: Pulmonary effort is normal.     Breath sounds: Normal breath sounds. No wheezing, rhonchi or rales.  Abdominal:     Tenderness: There is abdominal tenderness in the right upper quadrant. There is no guarding or rebound. Negative signs include Murphy's sign, Rovsing's sign and McBurney's sign.     Hernia: No hernia is present.  Genitourinary:    Comments: Chaperone present during exam.  Normal rectal tone, no obvious mass, no impacted stool, normal color stool on glove. Musculoskeletal:     Cervical back: Neck supple.  Skin:    Findings: No rash.  Neurological:     Mental Status: He is alert.     ED Results / Procedures / Treatments   Labs (all labs ordered are listed, but only abnormal results are displayed) Labs Reviewed  CBC WITH DIFFERENTIAL/PLATELET - Abnormal; Notable for the following components:      Result Value   Hemoglobin 8.9 (*)    HCT 29.8 (*)    MCV 70.0 (*)  MCH 20.9 (*)    MCHC 29.9 (*)    RDW 17.0 (*)    Platelets 434 (*)    All other components within normal limits  COMPREHENSIVE METABOLIC PANEL - Abnormal; Notable for the following components:   Sodium 133 (*)    BUN 36 (*)    Creatinine, Ser 1.47 (*)    GFR, Estimated 55 (*)    All other components within normal limits  POC OCCULT BLOOD, ED - Abnormal; Notable for the following components:   Fecal Occult Bld POSITIVE (*)    All other components within normal limits  LIPASE, BLOOD  URINALYSIS, ROUTINE W REFLEX MICROSCOPIC    EKG None  Radiology US Abdomen Limited  Result Date: 10/16/2022 CLINICAL DATA:  RUQ pain EXAM: ULTRASOUND ABDOMEN LIMITED RIGHT UPPER QUADRANT  COMPARISON:  CT scan abdomen and pelvis from 09/04/2021. FINDINGS: Gallbladder: Physiologically distended. There is a sub 4 mm sessile gallbladder polyp. No gallstones or abnormal wall thickening. No pericholecystic free fluid. No sonographic Murphy sign noted by sonographer. Common bile duct: Diameter: Less than 4 mm. Liver: No focal lesion identified. Within normal limits in parenchymal echogenicity. Liver echotexture is within normal limits. However, there is heterogeneous echogenicity with ill-defined interspersed Iso and hyperechoic areas within the technologist measured at least. 2 ill-defined hyperechoic lesions measured 4.2 x 5.0 x 5.2 cm and 1.9 x 1.9 x 2.6 cm. These are incompletely imaged and characterized on the current examination. Further evaluation with multiphasic contrast-enhanced MRI abdomen as per liver mass protocol is recommended. Portal vein is patent on color Doppler imaging with normal direction of blood flow towards the liver. Other: None. IMPRESSION: 1. No evidence of cholecystitis. 2. Heterogeneous liver with ill-defined hyperechoic lesions. Further evaluation with multiphasic contrast-enhanced MRI abdomen as per liver mass protocol is recommended. 3. There is a single sub 4 mm, low risk gallbladder polyp. Please see follow-up recommendations below. Management of Incidentally Detected Gallbladder Polyps: Society of Radiologists in Ultrasound Consensus Conference Recommendations (published online September 25, 2020): [email or web address] Extremely low-risk polyps, i.e. pedunculated ball-on-the-wall or thin stalk: <9 mm: no follow-up 10-14 mm: follow-up at 6, 12 and 24 months >15 mm: surgical consult Low-risk polyps, i.e. pedunculated with a thick or wide stalk, or sessile: ?6 mm: no follow-up 7-9 mm follow-up ultrasound at 12 months 10-14 mm: follow-up ultrasound at 6, 12, 24, and 36 months vs surgical consult >15 mm: surgical consult Intermediate risk: focal wall-thickening >7mm  adjacent to polyp: <6 mm: follow-up at 6, 12, 24, 36 months vs surgical consult >7 mm: surgical consult Electronically Signed   By: Jules Schick M.D.   On: 10/16/2022 15:32    Procedures Procedures    Medications Ordered in ED Medications  sodium chloride 0.9 % bolus 1,000 mL (1,000 mLs Intravenous New Bag/Given 10/16/22 1549)  ondansetron (ZOFRAN) injection 4 mg (4 mg Intravenous Given 10/16/22 1548)  morphine (PF) 4 MG/ML injection 4 mg (4 mg Intravenous Given 10/16/22 1549)    ED Course/ Medical Decision Making/ A&P                             Medical Decision Making Amount and/or Complexity of Data Reviewed Labs: ordered. Radiology: ordered.  Risk Prescription drug management. Decision regarding hospitalization.   BP (!) 161/69   Pulse 65   Temp (!) 97.4 F (36.3 C) (Oral)   Resp 19   SpO2 92%   18:37 PM  57 year old male significant history of  cholelithiasis, PUD, perforated ulcer, COPD, anemia, presenting to the ED via EMS from home with complaint of abdominal pain.  Patient report for the past 4 days he has had recurrent abdominal discomfort.  He described pain as a sharp cramping sensation to his right upper quadrant worsening with eating or drinking.  This all started shortly after he ate a burger that was given to him by his roommate.  He states he haven't been eating or drinking much and not making much urine.  Denies any burning sensation when urinate or urge to urinate.  Does not endorse fever or chills no chest pain or shortness of shortness of breath but states his pain is moderate in intensity.  He has not had a bowel movement recently but able to pass flatus.  Admits to tobacco use and smoked approximately a pack of cigarettes daily but denies alcohol use.  No history of diabetes.  On exam, patient is laying bed appears to be in no acute discomfort.  Heart with normal rate and rhythm, lungs clear to auscultation bilaterally abdomen is soft with tenderness to right  upper quadrant but no guarding or rebound tenderness.  Bowel sounds present.  Bladder scan performed without any detectable urine.  -Labs ordered, independently viewed and interpreted by me.  Labs remarkable for Hgb 8.9 it was 10.3 approximately 1 year ago. Hemoccult positive, will consult GI. Creatinine today is 1.47.  It was 0.69 one-year ago. IVF given -The patient was maintained on a cardiac monitor.  I personally viewed and interpreted the cardiac monitored which showed an underlying rhythm of: NSR -Imaging independently viewed and interpreted by me and I agree with radiologist's interpretation.  Result remarkable for NSR -This patient presents to the ED for concern of abd pain, this involves an extensive number of treatment options, and is a complaint that carries with it a high risk of complications and morbidity.  The differential diagnosis includes cholecystitis, colitis, pancreatitis, appendicitis -Co morbidities that complicate the patient evaluation includes gallstones, ulcers, anemia -Treatment includes cholecystitis, appendicitis, colitis, pancreatitis, hepatitis, kidney stone, pyelonephritis, pna, GERD, gastritis -Reevaluation of the patient after these medicines showed that the patient improved -PCP office notes or outside notes reviewed -Discussion with specialist GI Dr. Dulce Sellar who agrees to be involve in pt care -Escalation to admission/observation considered: patient is agreeable with admission.   4:29 PM Appreciate consultation from on-call GI specialist Dr. Dulce Sellar who will be involved in patient care in regards to his GI bleeding.  Will consult medicine for admission.  Workup thus far concerning for evidence of GI bleed with a hemoglobin of 8.9, as well as some AKI with creatinine 1.47 and patient having difficulty eating or drinking I felt patient will need to be admitted for supportive care, further workup, and await for the MRI of his liver to rule out malignancy.  5:35  PM Appreciate consultation from [facility] Dr. Loney Loh who agrees to see and will admit pt due to intractable nausea, abdominal pain and concerns for liver malignancy.  Pt has normal liver function panel.         Final Clinical Impression(s) / ED Diagnoses Final diagnoses:  Liver lesion  AKI (acute kidney injury) (HCC)  Intractable nausea  Lower GI bleed    Rx / DC Orders ED Discharge Orders     None         Fayrene Helper, PA-C 10/16/22 1737    Coral Spikes, DO 10/21/22 [phone]

## 2022-10-17 ENCOUNTER — Other Ambulatory Visit: Payer: Self-pay

## 2022-10-17 ENCOUNTER — Observation Stay (HOSPITAL_COMMUNITY): Payer: No Typology Code available for payment source

## 2022-10-17 DIAGNOSIS — J449 Chronic obstructive pulmonary disease, unspecified: Secondary | ICD-10-CM | POA: Diagnosis not present

## 2022-10-17 DIAGNOSIS — F17210 Nicotine dependence, cigarettes, uncomplicated: Secondary | ICD-10-CM | POA: Diagnosis present

## 2022-10-17 DIAGNOSIS — Z833 Family history of diabetes mellitus: Secondary | ICD-10-CM | POA: Diagnosis not present

## 2022-10-17 DIAGNOSIS — C180 Malignant neoplasm of cecum: Secondary | ICD-10-CM | POA: Diagnosis present

## 2022-10-17 DIAGNOSIS — D123 Benign neoplasm of transverse colon: Secondary | ICD-10-CM | POA: Diagnosis present

## 2022-10-17 DIAGNOSIS — B9681 Helicobacter pylori [H. pylori] as the cause of diseases classified elsewhere: Secondary | ICD-10-CM | POA: Diagnosis present

## 2022-10-17 DIAGNOSIS — K922 Gastrointestinal hemorrhage, unspecified: Secondary | ICD-10-CM | POA: Diagnosis present

## 2022-10-17 DIAGNOSIS — F1210 Cannabis abuse, uncomplicated: Secondary | ICD-10-CM | POA: Diagnosis present

## 2022-10-17 DIAGNOSIS — K769 Liver disease, unspecified: Secondary | ICD-10-CM

## 2022-10-17 DIAGNOSIS — D62 Acute posthemorrhagic anemia: Secondary | ICD-10-CM | POA: Diagnosis not present

## 2022-10-17 DIAGNOSIS — D509 Iron deficiency anemia, unspecified: Secondary | ICD-10-CM | POA: Diagnosis present

## 2022-10-17 DIAGNOSIS — Z8249 Family history of ischemic heart disease and other diseases of the circulatory system: Secondary | ICD-10-CM | POA: Diagnosis not present

## 2022-10-17 DIAGNOSIS — E860 Dehydration: Secondary | ICD-10-CM | POA: Diagnosis present

## 2022-10-17 DIAGNOSIS — I10 Essential (primary) hypertension: Secondary | ICD-10-CM | POA: Diagnosis present

## 2022-10-17 DIAGNOSIS — D126 Benign neoplasm of colon, unspecified: Secondary | ICD-10-CM | POA: Diagnosis not present

## 2022-10-17 DIAGNOSIS — R160 Hepatomegaly, not elsewhere classified: Secondary | ICD-10-CM | POA: Diagnosis present

## 2022-10-17 DIAGNOSIS — R933 Abnormal findings on diagnostic imaging of other parts of digestive tract: Secondary | ICD-10-CM | POA: Diagnosis not present

## 2022-10-17 DIAGNOSIS — R109 Unspecified abdominal pain: Secondary | ICD-10-CM | POA: Diagnosis not present

## 2022-10-17 DIAGNOSIS — Z681 Body mass index (BMI) 19 or less, adult: Secondary | ICD-10-CM | POA: Diagnosis not present

## 2022-10-17 DIAGNOSIS — J438 Other emphysema: Secondary | ICD-10-CM | POA: Diagnosis present

## 2022-10-17 DIAGNOSIS — N179 Acute kidney failure, unspecified: Secondary | ICD-10-CM

## 2022-10-17 DIAGNOSIS — K5669 Other partial intestinal obstruction: Secondary | ICD-10-CM | POA: Diagnosis present

## 2022-10-17 DIAGNOSIS — E871 Hypo-osmolality and hyponatremia: Secondary | ICD-10-CM | POA: Diagnosis present

## 2022-10-17 DIAGNOSIS — E440 Moderate protein-calorie malnutrition: Secondary | ICD-10-CM | POA: Diagnosis present

## 2022-10-17 DIAGNOSIS — K256 Chronic or unspecified gastric ulcer with both hemorrhage and perforation: Secondary | ICD-10-CM | POA: Diagnosis present

## 2022-10-17 DIAGNOSIS — K264 Chronic or unspecified duodenal ulcer with hemorrhage: Secondary | ICD-10-CM | POA: Diagnosis present

## 2022-10-17 DIAGNOSIS — C787 Secondary malignant neoplasm of liver and intrahepatic bile duct: Secondary | ICD-10-CM | POA: Diagnosis present

## 2022-10-17 DIAGNOSIS — K2981 Duodenitis with bleeding: Secondary | ICD-10-CM | POA: Diagnosis present

## 2022-10-17 DIAGNOSIS — J432 Centrilobular emphysema: Secondary | ICD-10-CM | POA: Diagnosis present

## 2022-10-17 DIAGNOSIS — D649 Anemia, unspecified: Secondary | ICD-10-CM | POA: Diagnosis not present

## 2022-10-17 DIAGNOSIS — K2971 Gastritis, unspecified, with bleeding: Secondary | ICD-10-CM | POA: Diagnosis present

## 2022-10-17 LAB — RETICULOCYTES
Immature Retic Fract: 14.3 % (ref 2.3–15.9)
RBC.: 3.78 MIL/uL — ABNORMAL LOW (ref 4.22–5.81)
Retic Count, Absolute: 63.1 10*3/uL (ref 19.0–186.0)
Retic Ct Pct: 1.7 % (ref 0.4–3.1)

## 2022-10-17 LAB — TSH: TSH: 1.018 u[IU]/mL (ref 0.350–4.500)

## 2022-10-17 LAB — VITAMIN B12: Vitamin B-12: 346 pg/mL (ref 180–914)

## 2022-10-17 LAB — IRON AND TIBC
Iron: 23 ug/dL — ABNORMAL LOW (ref 45–182)
Saturation Ratios: 7 % — ABNORMAL LOW (ref 17.9–39.5)
TIBC: 336 ug/dL (ref 250–450)
UIBC: 313 ug/dL

## 2022-10-17 LAB — FOLATE: Folate: 11.2 ng/mL (ref >5.9)

## 2022-10-17 LAB — FERRITIN: Ferritin: 7 ng/mL — ABNORMAL LOW (ref 24–336)

## 2022-10-17 MED ORDER — SODIUM CHLORIDE 0.9 % IV SOLN: INTRAVENOUS | Status: DC

## 2022-10-17 MED ORDER — PEG-KCL-NACL-NASULF-NA ASC-C 100 G PO SOLR: 1.0000 | Freq: Once | ORAL | Status: DC

## 2022-10-17 MED ORDER — AMLODIPINE BESYLATE 5 MG PO TABS
5.0000 mg | ORAL_TABLET | Freq: Every day | ORAL | Status: DC
  Administered 2022-10-17 – 2022-10-19 (×3): 5 mg via ORAL
  Filled 2022-10-17 (×3): qty 1

## 2022-10-17 MED ORDER — HYDRALAZINE HCL 20 MG/ML IJ SOLN
5.0000 mg | Freq: Once | INTRAMUSCULAR | Status: AC
  Administered 2022-10-17: 5 mg via INTRAVENOUS
  Filled 2022-10-17: qty 1

## 2022-10-17 MED ORDER — BISACODYL 10 MG RE SUPP
10.0000 mg | Freq: Once | RECTAL | Status: AC
  Administered 2022-10-17: 10 mg via RECTAL
  Filled 2022-10-17: qty 1

## 2022-10-17 MED ORDER — BOOST / RESOURCE BREEZE PO LIQD CUSTOM
1.0000 | Freq: Three times a day (TID) | ORAL | Status: DC
  Administered 2022-10-17 – 2022-10-21 (×8): 1 via ORAL

## 2022-10-17 MED ORDER — PEG-KCL-NACL-NASULF-NA ASC-C 100 G PO SOLR
0.5000 | Freq: Once | ORAL | Status: AC
  Administered 2022-10-17: 100 g via ORAL
  Filled 2022-10-17: qty 1

## 2022-10-17 MED ORDER — PEG-KCL-NACL-NASULF-NA ASC-C 100 G PO SOLR
0.5000 | Freq: Once | ORAL | Status: AC
  Administered 2022-10-18: 100 g via ORAL

## 2022-10-17 MED ORDER — IOHEXOL 300 MG/ML  SOLN
75.0000 mL | Freq: Once | INTRAMUSCULAR | Status: AC | PRN
  Administered 2022-10-17: 75 mL via INTRAVENOUS

## 2022-10-17 MED ORDER — LACTATED RINGERS IV BOLUS
500.0000 mL | Freq: Once | INTRAVENOUS | Status: AC
  Administered 2022-10-17: 500 mL via INTRAVENOUS

## 2022-10-17 MED ORDER — IPRATROPIUM-ALBUTEROL 0.5-2.5 (3) MG/3ML IN SOLN
3.0000 mL | Freq: Four times a day (QID) | RESPIRATORY_TRACT | Status: DC | PRN
  Administered 2022-10-20: 3 mL via RESPIRATORY_TRACT
  Filled 2022-10-17: qty 3

## 2022-10-17 NOTE — H&P (View-Only) (Signed)
[facility] Consultation Note  Referring Provider: [facility] Primary Care Physician:  Pcp, No Primary Gastroenterologist:  Gentry Fitz  Reason for Consultation:  abdominal pain, weight loss  HPI: Marvin Thomas is a 57 y.o. male admitted abdominal pain.  Couple weeks of periumbilical abdominal pain and dark stools.  Lost 15-20 lbs.  Microcytic anemia.  Using NSAIDs for abdominal pain.  Had laparotomy for perforated gastric ulcer requiring Graham patch in 2019.  Denies prior endoscopy/colonoscopy.  Imaging shows liver lesions worrisome for metastases of unknown primary.   Past Medical History:  Diagnosis Date   Anemia Dx 2014   Gallstones    Hemothorax    Ulcer     Past Surgical History:  Procedure Laterality Date   LAPAROTOMY N/A 12/09/2017   Procedure: EXPLORATORY LAPAROTOMY, REPAIR PERFORATED PYLEORIC ULCER;  Surgeon: Almond Lint, MD;  Location: [facility];  Service: General;  Laterality: N/A;    Prior to Admission medications   Medication Sig Start Date End Date Taking? Authorizing Provider  pantoprazole (PROTONIX) 20 MG tablet Take 2 tablets (40 mg total) by mouth daily. Patient not taking: Reported on 11/11/2020 12/13/17   Glenna Fellows, MD    Current Facility-Administered Medications  Medication Dose Route Frequency Provider Last Rate Last Admin   0.9 %  sodium chloride infusion   Intravenous Continuous Regalado, Belkys A, MD 100 mL/hr at 10/17/22 0818 Rate Change at 10/17/22 0818   acetaminophen (TYLENOL) tablet 650 mg  650 mg Oral Q6H PRN John Giovanni, MD       Or   acetaminophen (TYLENOL) suppository 650 mg  650 mg Rectal Q6H PRN John Giovanni, MD       feeding supplement (BOOST / RESOURCE BREEZE) liquid 1 Container  1 Container Oral TID BM Regalado, Belkys A, MD   1 Container at 10/17/22 1357   HYDROmorphone (DILAUDID) injection 1 mg  1 mg Intravenous Q4H PRN John Giovanni, MD   1 mg at 10/17/22 0813   naloxone (NARCAN) injection  0.4 mg  0.4 mg Intravenous PRN John Giovanni, MD       ondansetron [facility]) injection 4 mg  4 mg Intravenous Q6H PRN John Giovanni, MD   4 mg at 10/17/22 0813   pantoprazole (PROTONIX) injection 40 mg  40 mg Intravenous Q12H John Giovanni, MD   40 mg at 10/17/22 0957    Allergies as of 10/16/2022 - Review Complete 10/16/2022  Allergen Reaction Noted   Aspirin Nausea Only 09/10/2012    Family History  Problem Relation Age of Onset   Diabetes Mother    Hypertension Mother    Heart disease Mother        x 3    Heart disease Brother    Hypertension Brother    Diabetes Other    Hypertension Brother    Hypertension Brother    Cancer Neg Hx     Social History   Socioeconomic History   Marital status: Single    Spouse name: Not on file   Number of children: 1    Years of education: 12    Highest education level: Not on file  Occupational History   Occupation: Self-Employed    Comment: Landscaping and Financial trader   Tobacco Use   Smoking status: Every Day    Current packs/day: 1.50    Average packs/day: 1.5 packs/day for 31.0 years (46.5 ttl pk-yrs)    Types: Cigarettes   Smokeless tobacco: Current  Vaping Use   Vaping status: Every Day  Substance and  Sexual Activity   Alcohol use: Yes    Comment: quit 1980's    Drug use: Yes    Types: Marijuana    Comment: Patient reports using last night   Sexual activity: Yes    Birth control/protection: Condom  Other Topics Concern   Not on file  Social History Narrative   ** Merged History Encounter **       Lives with mother Marvin Thomas  Has 36 yo son.  Lives with his mother and grandmother.    Social Determinants of Health   Financial Resource Strain: Not on File (07/11/2021)   Received from [facility], [place] Strain    Financial Resource Strain: 0  Food Insecurity: No Food Insecurity (10/16/2022)   Hunger Vital Sign    Worried About Running Out of Food in the Last Year: Never true     Ran Out of Food in the Last Year: Never true  Transportation Needs: No Transportation Needs (10/16/2022)   PRAPARE - Administrator, Civil Service (Medical): No    Lack of Transportation (Non-Medical): No  Physical Activity: Not on File (07/11/2021)   Received from [place], [place]   Physical Activity    Physical Activity: 0  Stress: Not on File (07/11/2021)   Received from [facility], [place]   Stress    Stress: 0  Social Connections: Not on File (07/11/2021)   Received from [place], [place]   Social Connections    Social Connections and Isolation: 0  Intimate Partner Violence: Not At Risk (10/16/2022)   Humiliation, Afraid, Rape, and Kick questionnaire    Fear of Current or Ex-Partner: No    Emotionally Abused: No    Physically Abused: No    Sexually Abused: No    Review of Systems: As per HPI, all others negative  Physical Exam: Vital signs in last 24 hours: Temp:  [97.6 F (36.4 C)-98.5 F (36.9 C)] 97.6 F (36.4 C) (07/26 0525) Pulse Rate:  [50-67] 55 (07/26 0525) Resp:  [15-21] 18 (07/26 0525) BP: (158-203)/(74-94) 171/74 (07/26 0525) SpO2:  [97 %-100 %] 97 % (07/26 0636) Weight:  [53.6 kg] 53.6 kg (07/25 2122) Last BM Date :  (PTA, patient says "it's been awhile" ; MD note reports "two days ago") General:   Alert,  thin and somewhat cachectic-appearing, pleasant and cooperative in NAD Head:  Normocephalic and atraumatic. Eyes:  Sclera clear, no icterus.   Conjunctiva pink. Ears:  Normal auditory acuity. Nose:  No deformity, discharge,  or lesions. Mouth:  No deformity or lesions.  Oropharynx pink & moist. Neck:  Supple; no masses or thyromegaly. Lungs:  No respiratory distress Abdomen:  Soft, thin abdominal wall, mild generalized tenderness, No masses, hepatosplenomegaly or hernias noted. Without guarding, and without rebound.     Msk:  Symmetrical muscular atrophy otherwise without gross deformities. Normal posture. Pulses:  Normal pulses noted. Extremities:   Without clubbing or edema. Neurologic:  Alert and  oriented x4; diffusely weak otherwise grossly normal neurologically. Skin:  Intact without significant lesions or rashes. Psych:  Alert and cooperative. Normal mood and affect.   Lab Results: Recent Labs    10/16/22 1338 10/17/22 0509  WBC 6.1 7.6  HGB 8.9* 8.9*  HCT 29.8* 30.5*  PLT 434* 328   BMET Recent Labs    10/16/22 1338 10/17/22 0509  NA 133* 132*  K 3.8 4.3  CL 101 103  CO2 22 19*  GLUCOSE 92 77  BUN 36* 29*  CREATININE 1.47*  1.01  CALCIUM 9.0 8.6*   LFT Recent Labs    10/16/22 1338  PROT 8.1  ALBUMIN 4.1  AST 17  ALT 13  ALKPHOS 97  BILITOT 0.8   PT/INR No results for input(s): "LABPROT", "INR" in the last 72 hours.  Studies/Results: MR LIVER W WO CONTRAST  Result Date: 10/17/2022 CLINICAL DATA:  57 year old male with history of multiple indeterminate liver lesions noted on prior CT examination. Follow-up study. EXAM: MRI ABDOMEN WITHOUT AND WITH CONTRAST TECHNIQUE: Multiplanar multisequence MR imaging of the abdomen was performed both before and after the administration of intravenous contrast. CONTRAST:  6mL GADAVIST GADOBUTROL 1 MMOL/ML IV SOLN COMPARISON:  No prior abdominal MRI. CT of the abdomen and pelvis 10/16/2022. FINDINGS: Lower chest: Unremarkable. Hepatobiliary: There are 4 lesions in the liver which are low T1 signal intensity, high T2 signal intensity, demonstrates restricted diffusion, and demonstrate heterogeneous enhancement on post gadolinium imaging generally centrally hypovascular with peripheral enhancement, highly suspicious for metastatic disease. The largest of these is located in the right lobe of the liver predominantly centered in segment 8 (axial image 28 of series 10) estimated to measure approximately 5.0 x 4.1 cm, with additional lesions in segment 4A (axial image 20 of series 10), central aspect of segment 8 (axial image 21 of series 10), and inferior aspect of segment 6 (axial  image 51 of series 10). A few other scattered tiny subcentimeter T1 hypointense, T2 hyperintense, nonenhancing lesions are noted in the liver, compatible with tiny cysts and/or biliary hamartomas. No intra or extrahepatic biliary ductal dilatation. Gallbladder is moderately distended. There is some dependent amorphous T1 hyperintense material which may represent some biliary sludge. Gallbladder wall does not appear thickened. No definite gallstones. Pancreas: No pancreatic mass. No pancreatic ductal dilatation. No pancreatic or peripancreatic fluid collections or inflammatory changes. Spleen:  Unremarkable. Adrenals/Urinary Tract: Bilateral kidneys and adrenal glands are normal in appearance. No hydroureteronephrosis in the visualized portions of the abdomen. Stomach/Bowel: Visualized portions are unremarkable. Vascular/Lymphatic: No aneurysm identified in the visualized abdominal vasculature. No definite lymphadenopathy noted in the abdomen. Other: No significant volume of ascites noted in the visualized portions of the peritoneal cavity. Musculoskeletal: No aggressive appearing osseous lesions are noted in the visualized portions of the skeleton. IMPRESSION: 1. Four aggressive appearing hepatic lesions which have imaging characteristics most suggestive of metastatic disease to the liver. Further clinical evaluation to establish a source of primary malignancy is recommended. Electronically Signed   By: Trudie Reed M.D.   On: 10/17/2022 05:29   CT ABDOMEN PELVIS WO CONTRAST  Result Date: 10/16/2022 CLINICAL DATA:  Abdominal pain, acute, nonlocalized EXAM: CT ABDOMEN AND PELVIS WITHOUT CONTRAST TECHNIQUE: Multidetector CT imaging of the abdomen and pelvis was performed following the standard protocol without IV contrast. RADIATION DOSE REDUCTION: This exam was performed according to the departmental dose-optimization program which includes automated exposure control, adjustment of the mA and/or kV according  to patient size and/or use of iterative reconstruction technique. COMPARISON:  09/04/2021 FINDINGS: Lower chest: No acute abnormality Hepatobiliary: Subtle low-density areas seen anteriorly on image 12, centrally on image 14, and in the right hepatic lobe anteriorly on image 18. These are difficult to characterize without IV contrast. Small cyst in the hepatic dome measures 7 mm. Gallbladder unremarkable. No biliary ductal dilatation. Pancreas: No focal abnormality or ductal dilatation. Spleen: No focal abnormality.  Normal size. Adrenals/Urinary Tract: No adrenal abnormality. No focal renal abnormality. No stones or hydronephrosis. Urinary bladder is unremarkable. Stomach/Bowel: Normal appendix. Stomach, large  and small bowel grossly unremarkable. Vascular/Lymphatic: Scattered aortoiliac atherosclerosis. No evidence of aneurysm or adenopathy. Reproductive: No visible focal abnormality. Other: No free fluid or free air. Musculoskeletal: No acute bony abnormality. IMPRESSION: Subtle ill-defined hypo dense areas within the liver. Differential considerations would include focal fatty infiltration or masses. Recommend further evaluation with contrast-enhanced CT or right upper quadrant ultrasound. Aortoiliac atherosclerosis. Electronically Signed   By: Charlett Nose M.D.   On: 10/16/2022 19:23   US Abdomen Limited  Result Date: 10/16/2022 CLINICAL DATA:  RUQ pain EXAM: ULTRASOUND ABDOMEN LIMITED RIGHT UPPER QUADRANT COMPARISON:  CT scan abdomen and pelvis from 09/04/2021. FINDINGS: Gallbladder: Physiologically distended. There is a sub 4 mm sessile gallbladder polyp. No gallstones or abnormal wall thickening. No pericholecystic free fluid. No sonographic Murphy sign noted by sonographer. Common bile duct: Diameter: Less than 4 mm. Liver: No focal lesion identified. Within normal limits in parenchymal echogenicity. Liver echotexture is within normal limits. However, there is heterogeneous echogenicity with ill-defined  interspersed Iso and hyperechoic areas within the technologist measured at least. 2 ill-defined hyperechoic lesions measured 4.2 x 5.0 x 5.2 cm and 1.9 x 1.9 x 2.6 cm. These are incompletely imaged and characterized on the current examination. Further evaluation with multiphasic contrast-enhanced MRI abdomen as per liver mass protocol is recommended. Portal vein is patent on color Doppler imaging with normal direction of blood flow towards the liver. Other: None. IMPRESSION: 1. No evidence of cholecystitis. 2. Heterogeneous liver with ill-defined hyperechoic lesions. Further evaluation with multiphasic contrast-enhanced MRI abdomen as per liver mass protocol is recommended. 3. There is a single sub 4 mm, low risk gallbladder polyp. Please see follow-up recommendations below. Management of Incidentally Detected Gallbladder Polyps: Society of Radiologists in Ultrasound Consensus Conference Recommendations (published online September 25, 2020): [email or web address] Extremely low-risk polyps, i.e. pedunculated ball-on-the-wall or thin stalk: <9 mm: no follow-up 10-14 mm: follow-up at 6, 12 and 24 months >15 mm: surgical consult Low-risk polyps, i.e. pedunculated with a thick or wide stalk, or sessile: ?6 mm: no follow-up 7-9 mm follow-up ultrasound at 12 months 10-14 mm: follow-up ultrasound at 6, 12, 24, and 36 months vs surgical consult >15 mm: surgical consult Intermediate risk: focal wall-thickening >69mm adjacent to polyp: <6 mm: follow-up at 6, 12, 24, 36 months vs surgical consult >7 mm: surgical consult Electronically Signed   By: Jules Schick M.D.   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 Abdominal pain. Melena. Microcytic anemia. Weight loss. NSAID use. History perforated gastric ulcer s/p surgery with Cheree Ditto patch in 2019.  Plan:   Clear liquid diet, NPO after midnight. PPI, cbc, hydration, transfuse as needed. Endoscopy and  colonoscopy tomorrow 0730 with Dr. Levora Angel. Risks (bleeding, infection, bowel perforation that could require surgery, sedation-related changes in cardiopulmonary systems), benefits (identification and possible treatment of source of symptoms, exclusion of certain causes of symptoms), and alternatives (watchful waiting, radiographic imaging studies, empiric medical treatment) of upper endoscopy (EGD) were explained to patient/family in detail and patient wishes to proceed.  Risks (bleeding, infection, bowel perforation that could require surgery, sedation-related changes in cardiopulmonary systems), benefits (identification and possible treatment of source of symptoms, exclusion of certain causes of symptoms), and alternatives (watchful waiting, radiographic imaging studies, empiric medical treatment) of colonoscopy were explained to patient/family in detail and patient wishes to proceed.  Next step management pending endoscopy/colonoscopy findings. [facility] will follow.   LOS: 0 days   , M  10/17/2022, 2:03 PM  Cell [phone] If no answer or after 5 PM call [phone]

## 2022-10-17 NOTE — Progress Notes (Signed)
PROGRESS NOTE    Marvin Thomas  XBM:[id number] DOB: July 04, 1965 DOA: 10/16/2022 PCP: Pcp, No   Brief Narrative: 57 year old past medical history significant for COPD, chronic anemia, gallstone, hemothorax, GERD, PUD/perforated pyloric ulcer status postrepair 2019, substance abuse (marijuana tobacco) presents complaining of right upper quadrant abdominal pain worse after eating or drinking.  He was found to have hemoglobin of 8.9 previously 10 and 12, AKI with a creatinine of 1.4, normal lipase and liver function test.  Right upper quadrant ultrasound showed hyperechoic lesion and a single sip 4 mm low risk gallbladder polyp.  Patient also reports melena.  MRI of the liver showed 4 aggressive appearing hepatic lesion which have imaging characteristics most suggestive of metastatic disease to the liver.  Further clinical evaluation to establish as source of primary malignancies recommended   Assessment & Plan:   Principal Problem:   Abdominal pain Active Problems:   COPD (chronic obstructive pulmonary disease) (HCC)   Acute on chronic anemia   GI bleed   AKI (acute kidney injury) (HCC)   Bradycardia  1-Epigastric pain Acute on chronic blood loss anemia Melena, GI bleed: -Continue with IV Protonix.  -Plan for endoscopy, colonoscopy tomorrow.   2-AKI: -In setting of poor oral intake.  -Presented with Cr 1.4 Trending down.  -Continue with IV fluids.    3-Multiple liver lesions.  -Plan for endoscopy and colonoscopy tomorrow.  -Will check CT chest   4-Bradycardia -Asymptomatic.  -check mg level.  -check TSH.   Hypertension: will start Norvasc.   Mild hyponatremia: -Continue with IV fluids.   Abnormal urinalysis pyuria: -Follow Urine Culture.   COPD: -PRN nebulizer.        Estimated body mass index is 17.45 kg/m as calculated from the following:   Height as of this encounter: 5\' 9"  (1.753 m).   Weight as of this encounter: 53.6 kg.   DVT prophylaxis:  SCD Code Status: Full code Family Communication: Care discussed with patient.  Disposition Plan:  Status is: Observation The patient will require care spanning > 2 midnights and should be moved to inpatient because: management of anemia. AKI    Consultants:  GI  Procedures:    Antimicrobials:    Subjective: He is alert, report melena, 2 days ago. Report abdominal pain after eating. He has not been able to eat for 7 days.  He report constipation, he can go 7 days without a BM . He report weight loss.   Objective: Vitals:   10/17/22 0301 10/17/22 0427 10/17/22 0429 10/17/22 0525  BP: (!) 170/77 (!) 172/81 (!) 158/75 (!) 171/74  Pulse: (!) 53 (!) 53 (!) 50 (!) 55  Resp: 17   18  Temp:    97.6 F (36.4 C)  TempSrc:    Oral  SpO2: 100% 100% 100% 99%  Weight:      Height:        Intake/Output Summary (Last 24 hours) at 10/17/2022 0738 Last data filed at 10/17/2022 0272 Gross per 24 hour  Intake 1745.83 ml  Output 250 ml  Net 1495.83 ml   Filed Weights   10/16/22 2122  Weight: 53.6 kg    Examination:  General exam: Appears calm and comfortable thin.  Respiratory system: Clear to auscultation. Respiratory effort normal. Cardiovascular system: S1 & S2 heard, RRR.  Gastrointestinal system: Abdomen is nondistended, soft and nontender. No organomegaly or masses felt. Normal bowel sounds heard. Central nervous system: Alert and oriented.  Extremities: Symmetric 5 x 5 power.   Data Reviewed:  I have personally reviewed following labs and imaging studies  CBC: Recent Labs  Lab 10/16/22 1338 10/17/22 0509  WBC 6.1 7.6  NEUTROABS 4.6  --   HGB 8.9* 8.9*  HCT 29.8* 30.5*  MCV 70.0* 72.3*  PLT 434* 328   Basic Metabolic Panel: Recent Labs  Lab 10/16/22 1338 10/17/22 0509  NA 133* 132*  K 3.8 4.3  CL 101 103  CO2 22 19*  GLUCOSE 92 77  BUN 36* 29*  CREATININE 1.47* 1.01  CALCIUM 9.0 8.6*   GFR: Estimated Creatinine Clearance: 61.2 mL/min (by C-G formula  based on SCr of 1.01 mg/dL). Liver Function Tests: Recent Labs  Lab 10/16/22 1338  AST 17  ALT 13  ALKPHOS 97  BILITOT 0.8  PROT 8.1  ALBUMIN 4.1   Recent Labs  Lab 10/16/22 1338  LIPASE 30   No results for input(s): "AMMONIA" in the last 168 hours. Coagulation Profile: No results for input(s): "INR", "PROTIME" in the last 168 hours. Cardiac Enzymes: No results for input(s): "CKTOTAL", "CKMB", "CKMBINDEX", "TROPONINI" in the last 168 hours. BNP (last 3 results) No results for input(s): "PROBNP" in the last 8760 hours. HbA1C: No results for input(s): "HGBA1C" in the last 72 hours. CBG: No results for input(s): "GLUCAP" in the last 168 hours. Lipid Profile: No results for input(s): "CHOL", "HDL", "LDLCALC", "TRIG", "CHOLHDL", "LDLDIRECT" in the last 72 hours. Thyroid Function Tests: No results for input(s): "TSH", "T4TOTAL", "FREET4", "T3FREE", "THYROIDAB" in the last 72 hours. Anemia Panel: No results for input(s): "VITAMINB12", "FOLATE", "FERRITIN", "TIBC", "IRON", "RETICCTPCT" in the last 72 hours. Sepsis Labs: No results for input(s): "PROCALCITON", "LATICACIDVEN" in the last 168 hours.  No results found for this or any previous visit (from the past 240 hour(s)).       Radiology Studies: MR LIVER W WO CONTRAST  Result Date: 10/17/2022 CLINICAL DATA:  57 year old male with history of multiple indeterminate liver lesions noted on prior CT examination. Follow-up study. EXAM: MRI ABDOMEN WITHOUT AND WITH CONTRAST TECHNIQUE: Multiplanar multisequence MR imaging of the abdomen was performed both before and after the administration of intravenous contrast. CONTRAST:  6mL GADAVIST GADOBUTROL 1 MMOL/ML IV SOLN COMPARISON:  No prior abdominal MRI. CT of the abdomen and pelvis 10/16/2022. FINDINGS: Lower chest: Unremarkable. Hepatobiliary: There are 4 lesions in the liver which are low T1 signal intensity, high T2 signal intensity, demonstrates restricted diffusion, and  demonstrate heterogeneous enhancement on post gadolinium imaging generally centrally hypovascular with peripheral enhancement, highly suspicious for metastatic disease. The largest of these is located in the right lobe of the liver predominantly centered in segment 8 (axial image 28 of series 10) estimated to measure approximately 5.0 x 4.1 cm, with additional lesions in segment 4A (axial image 20 of series 10), central aspect of segment 8 (axial image 21 of series 10), and inferior aspect of segment 6 (axial image 51 of series 10). A few other scattered tiny subcentimeter T1 hypointense, T2 hyperintense, nonenhancing lesions are noted in the liver, compatible with tiny cysts and/or biliary hamartomas. No intra or extrahepatic biliary ductal dilatation. Gallbladder is moderately distended. There is some dependent amorphous T1 hyperintense material which may represent some biliary sludge. Gallbladder wall does not appear thickened. No definite gallstones. Pancreas: No pancreatic mass. No pancreatic ductal dilatation. No pancreatic or peripancreatic fluid collections or inflammatory changes. Spleen:  Unremarkable. Adrenals/Urinary Tract: Bilateral kidneys and adrenal glands are normal in appearance. No hydroureteronephrosis in the visualized portions of the abdomen. Stomach/Bowel: Visualized portions are unremarkable.  Vascular/Lymphatic: No aneurysm identified in the visualized abdominal vasculature. No definite lymphadenopathy noted in the abdomen. Other: No significant volume of ascites noted in the visualized portions of the peritoneal cavity. Musculoskeletal: No aggressive appearing osseous lesions are noted in the visualized portions of the skeleton. IMPRESSION: 1. Four aggressive appearing hepatic lesions which have imaging characteristics most suggestive of metastatic disease to the liver. Further clinical evaluation to establish a source of primary malignancy is recommended. Electronically Signed   By: Trudie Reed M.D.   On: 10/17/2022 05:29   CT ABDOMEN PELVIS WO CONTRAST  Result Date: 10/16/2022 CLINICAL DATA:  Abdominal pain, acute, nonlocalized EXAM: CT ABDOMEN AND PELVIS WITHOUT CONTRAST TECHNIQUE: Multidetector CT imaging of the abdomen and pelvis was performed following the standard protocol without IV contrast. RADIATION DOSE REDUCTION: This exam was performed according to the departmental dose-optimization program which includes automated exposure control, adjustment of the mA and/or kV according to patient size and/or use of iterative reconstruction technique. COMPARISON:  09/04/2021 FINDINGS: Lower chest: No acute abnormality Hepatobiliary: Subtle low-density areas seen anteriorly on image 12, centrally on image 14, and in the right hepatic lobe anteriorly on image 18. These are difficult to characterize without IV contrast. Small cyst in the hepatic dome measures 7 mm. Gallbladder unremarkable. No biliary ductal dilatation. Pancreas: No focal abnormality or ductal dilatation. Spleen: No focal abnormality.  Normal size. Adrenals/Urinary Tract: No adrenal abnormality. No focal renal abnormality. No stones or hydronephrosis. Urinary bladder is unremarkable. Stomach/Bowel: Normal appendix. Stomach, large and small bowel grossly unremarkable. Vascular/Lymphatic: Scattered aortoiliac atherosclerosis. No evidence of aneurysm or adenopathy. Reproductive: No visible focal abnormality. Other: No free fluid or free air. Musculoskeletal: No acute bony abnormality. IMPRESSION: Subtle ill-defined hypo dense areas within the liver. Differential considerations would include focal fatty infiltration or masses. Recommend further evaluation with contrast-enhanced CT or right upper quadrant ultrasound. Aortoiliac atherosclerosis. Electronically Signed   By: Charlett Nose M.D.   On: 10/16/2022 19:23   US Abdomen Limited  Result Date: 10/16/2022 CLINICAL DATA:  RUQ pain EXAM: ULTRASOUND ABDOMEN LIMITED RIGHT UPPER  QUADRANT COMPARISON:  CT scan abdomen and pelvis from 09/04/2021. FINDINGS: Gallbladder: Physiologically distended. There is a sub 4 mm sessile gallbladder polyp. No gallstones or abnormal wall thickening. No pericholecystic free fluid. No sonographic Murphy sign noted by sonographer. Common bile duct: Diameter: Less than 4 mm. Liver: No focal lesion identified. Within normal limits in parenchymal echogenicity. Liver echotexture is within normal limits. However, there is heterogeneous echogenicity with ill-defined interspersed Iso and hyperechoic areas within the technologist measured at least. 2 ill-defined hyperechoic lesions measured 4.2 x 5.0 x 5.2 cm and 1.9 x 1.9 x 2.6 cm. These are incompletely imaged and characterized on the current examination. Further evaluation with multiphasic contrast-enhanced MRI abdomen as per liver mass protocol is recommended. Portal vein is patent on color Doppler imaging with normal direction of blood flow towards the liver. Other: None. IMPRESSION: 1. No evidence of cholecystitis. 2. Heterogeneous liver with ill-defined hyperechoic lesions. Further evaluation with multiphasic contrast-enhanced MRI abdomen as per liver mass protocol is recommended. 3. There is a single sub 4 mm, low risk gallbladder polyp. Please see follow-up recommendations below. Management of Incidentally Detected Gallbladder Polyps: Society of Radiologists in Ultrasound Consensus Conference Recommendations (published online September 25, 2020): [email or web address] Extremely low-risk polyps, i.e. pedunculated ball-on-the-wall or thin stalk: <9 mm: no follow-up 10-14 mm: follow-up at 6, 12 and 24 months >15 mm: surgical consult Low-risk polyps, i.e. pedunculated with a thick or  wide stalk, or sessile: ?6 mm: no follow-up 7-9 mm follow-up ultrasound at 12 months 10-14 mm: follow-up ultrasound at 6, 12, 24, and 36 months vs surgical consult >15 mm: surgical consult Intermediate risk: focal  wall-thickening >44mm adjacent to polyp: <6 mm: follow-up at 6, 12, 24, 36 months vs surgical consult >7 mm: surgical consult Electronically Signed   By: Jules Schick M.D.   On: 10/16/2022 15:32        Scheduled Meds:  pantoprazole (PROTONIX) IV  40 mg Intravenous Q12H   Continuous Infusions:  sodium chloride 125 mL/hr at 10/16/22 2302     LOS: 0 days    Time spent: 35 minutes.     Alba Cory, MD [facility]   If 7PM-7AM, please contact night-coverage [email or web address]  10/17/2022, 7:38 AM

## 2022-10-17 NOTE — Consult Note (Signed)
[facility] Consultation Note  Referring Provider: [facility] Primary Care Physician:  Pcp, No Primary Gastroenterologist:  Gentry Fitz  Reason for Consultation:  abdominal pain, weight loss  HPI: Marvin Thomas is a 57 y.o. male admitted abdominal pain.  Couple weeks of periumbilical abdominal pain and dark stools.  Lost 15-20 lbs.  Microcytic anemia.  Using NSAIDs for abdominal pain.  Had laparotomy for perforated gastric ulcer requiring Graham patch in 2019.  Denies prior endoscopy/colonoscopy.  Imaging shows liver lesions worrisome for metastases of unknown primary.   Past Medical History:  Diagnosis Date   Anemia Dx 2014   Gallstones    Hemothorax    Ulcer     Past Surgical History:  Procedure Laterality Date   LAPAROTOMY N/A 12/09/2017   Procedure: EXPLORATORY LAPAROTOMY, REPAIR PERFORATED PYLEORIC ULCER;  Surgeon: Almond Lint, MD;  Location: [facility];  Service: General;  Laterality: N/A;    Prior to Admission medications   Medication Sig Start Date End Date Taking? Authorizing Provider  pantoprazole (PROTONIX) 20 MG tablet Take 2 tablets (40 mg total) by mouth daily. Patient not taking: Reported on 11/11/2020 12/13/17   Glenna Fellows, MD    Current Facility-Administered Medications  Medication Dose Route Frequency Provider Last Rate Last Admin   0.9 %  sodium chloride infusion   Intravenous Continuous Regalado, Belkys A, MD 100 mL/hr at 10/17/22 0818 Rate Change at 10/17/22 0818   acetaminophen (TYLENOL) tablet 650 mg  650 mg Oral Q6H PRN John Giovanni, MD       Or   acetaminophen (TYLENOL) suppository 650 mg  650 mg Rectal Q6H PRN John Giovanni, MD       feeding supplement (BOOST / RESOURCE BREEZE) liquid 1 Container  1 Container Oral TID BM Regalado, Belkys A, MD   1 Container at 10/17/22 1357   HYDROmorphone (DILAUDID) injection 1 mg  1 mg Intravenous Q4H PRN John Giovanni, MD   1 mg at 10/17/22 0813   naloxone (NARCAN) injection  0.4 mg  0.4 mg Intravenous PRN John Giovanni, MD       ondansetron [facility]) injection 4 mg  4 mg Intravenous Q6H PRN John Giovanni, MD   4 mg at 10/17/22 0813   pantoprazole (PROTONIX) injection 40 mg  40 mg Intravenous Q12H John Giovanni, MD   40 mg at 10/17/22 0957    Allergies as of 10/16/2022 - Review Complete 10/16/2022  Allergen Reaction Noted   Aspirin Nausea Only 09/10/2012    Family History  Problem Relation Age of Onset   Diabetes Mother    Hypertension Mother    Heart disease Mother        x 3    Heart disease Brother    Hypertension Brother    Diabetes Other    Hypertension Brother    Hypertension Brother    Cancer Neg Hx     Social History   Socioeconomic History   Marital status: Single    Spouse name: Not on file   Number of children: 1    Years of education: 12    Highest education level: Not on file  Occupational History   Occupation: Self-Employed    Comment: Landscaping and Financial trader   Tobacco Use   Smoking status: Every Day    Current packs/day: 1.50    Average packs/day: 1.5 packs/day for 31.0 years (46.5 ttl pk-yrs)    Types: Cigarettes   Smokeless tobacco: Current  Vaping Use   Vaping status: Every Day  Substance and  Sexual Activity   Alcohol use: Yes    Comment: quit 1980's    Drug use: Yes    Types: Marijuana    Comment: Patient reports using last night   Sexual activity: Yes    Birth control/protection: Condom  Other Topics Concern   Not on file  Social History Narrative   ** Merged History Encounter **       Lives with mother [place]  Has 36 yo son.  Lives with his mother and grandmother.    Social Determinants of Health   Financial Resource Strain: Not on File (07/11/2021)   Received from [facility], [place] Strain    Financial Resource Strain: 0  Food Insecurity: No Food Insecurity (10/16/2022)   Hunger Vital Sign    Worried About Running Out of Food in the Last Year: Never true     Ran Out of Food in the Last Year: Never true  Transportation Needs: No Transportation Needs (10/16/2022)   PRAPARE - Administrator, Civil Service (Medical): No    Lack of Transportation (Non-Medical): No  Physical Activity: Not on File (07/11/2021)   Received from [place], [place]   Physical Activity    Physical Activity: 0  Stress: Not on File (07/11/2021)   Received from [facility], [place]   Stress    Stress: 0  Social Connections: Not on File (07/11/2021)   Received from [place], [place]   Social Connections    Social Connections and Isolation: 0  Intimate Partner Violence: Not At Risk (10/16/2022)   Humiliation, Afraid, Rape, and Kick questionnaire    Fear of Current or Ex-Partner: No    Emotionally Abused: No    Physically Abused: No    Sexually Abused: No    Review of Systems: As per HPI, all others negative  Physical Exam: Vital signs in last 24 hours: Temp:  [97.6 F (36.4 C)-98.5 F (36.9 C)] 97.6 F (36.4 C) (07/26 0525) Pulse Rate:  [50-67] 55 (07/26 0525) Resp:  [15-21] 18 (07/26 0525) BP: (158-203)/(74-94) 171/74 (07/26 0525) SpO2:  [97 %-100 %] 97 % (07/26 0636) Weight:  [53.6 kg] 53.6 kg (07/25 2122) Last BM Date :  (PTA, patient says "it's been awhile" ; MD note reports "two days ago") General:   Alert,  thin and somewhat cachectic-appearing, pleasant and cooperative in NAD Head:  Normocephalic and atraumatic. Eyes:  Sclera clear, no icterus.   Conjunctiva pink. Ears:  Normal auditory acuity. Nose:  No deformity, discharge,  or lesions. Mouth:  No deformity or lesions.  Oropharynx pink & moist. Neck:  Supple; no masses or thyromegaly. Lungs:  No respiratory distress Abdomen:  Soft, thin abdominal wall, mild generalized tenderness, No masses, hepatosplenomegaly or hernias noted. Without guarding, and without rebound.     Msk:  Symmetrical muscular atrophy otherwise without gross deformities. Normal posture. Pulses:  Normal pulses noted. Extremities:   Without clubbing or edema. Neurologic:  Alert and  oriented x4; diffusely weak otherwise grossly normal neurologically. Skin:  Intact without significant lesions or rashes. Psych:  Alert and cooperative. Normal mood and affect.   Lab Results: Recent Labs    10/16/22 1338 10/17/22 0509  WBC 6.1 7.6  HGB 8.9* 8.9*  HCT 29.8* 30.5*  PLT 434* 328   BMET Recent Labs    10/16/22 1338 10/17/22 0509  NA 133* 132*  K 3.8 4.3  CL 101 103  CO2 22 19*  GLUCOSE 92 77  BUN 36* 29*  CREATININE 1.47*  1.01  CALCIUM 9.0 8.6*   LFT Recent Labs    10/16/22 1338  PROT 8.1  ALBUMIN 4.1  AST 17  ALT 13  ALKPHOS 97  BILITOT 0.8   PT/INR No results for input(s): "LABPROT", "INR" in the last 72 hours.  Studies/Results: MR LIVER W WO CONTRAST  Result Date: 10/17/2022 CLINICAL DATA:  57 year old male with history of multiple indeterminate liver lesions noted on prior CT examination. Follow-up study. EXAM: MRI ABDOMEN WITHOUT AND WITH CONTRAST TECHNIQUE: Multiplanar multisequence MR imaging of the abdomen was performed both before and after the administration of intravenous contrast. CONTRAST:  6mL GADAVIST GADOBUTROL 1 MMOL/ML IV SOLN COMPARISON:  No prior abdominal MRI. CT of the abdomen and pelvis 10/16/2022. FINDINGS: Lower chest: Unremarkable. Hepatobiliary: There are 4 lesions in the liver which are low T1 signal intensity, high T2 signal intensity, demonstrates restricted diffusion, and demonstrate heterogeneous enhancement on post gadolinium imaging generally centrally hypovascular with peripheral enhancement, highly suspicious for metastatic disease. The largest of these is located in the right lobe of the liver predominantly centered in segment 8 (axial image 28 of series 10) estimated to measure approximately 5.0 x 4.1 cm, with additional lesions in segment 4A (axial image 20 of series 10), central aspect of segment 8 (axial image 21 of series 10), and inferior aspect of segment 6 (axial  image 51 of series 10). A few other scattered tiny subcentimeter T1 hypointense, T2 hyperintense, nonenhancing lesions are noted in the liver, compatible with tiny cysts and/or biliary hamartomas. No intra or extrahepatic biliary ductal dilatation. Gallbladder is moderately distended. There is some dependent amorphous T1 hyperintense material which may represent some biliary sludge. Gallbladder wall does not appear thickened. No definite gallstones. Pancreas: No pancreatic mass. No pancreatic ductal dilatation. No pancreatic or peripancreatic fluid collections or inflammatory changes. Spleen:  Unremarkable. Adrenals/Urinary Tract: Bilateral kidneys and adrenal glands are normal in appearance. No hydroureteronephrosis in the visualized portions of the abdomen. Stomach/Bowel: Visualized portions are unremarkable. Vascular/Lymphatic: No aneurysm identified in the visualized abdominal vasculature. No definite lymphadenopathy noted in the abdomen. Other: No significant volume of ascites noted in the visualized portions of the peritoneal cavity. Musculoskeletal: No aggressive appearing osseous lesions are noted in the visualized portions of the skeleton. IMPRESSION: 1. Four aggressive appearing hepatic lesions which have imaging characteristics most suggestive of metastatic disease to the liver. Further clinical evaluation to establish a source of primary malignancy is recommended. Electronically Signed   By: Trudie Reed M.D.   On: 10/17/2022 05:29   CT ABDOMEN PELVIS WO CONTRAST  Result Date: 10/16/2022 CLINICAL DATA:  Abdominal pain, acute, nonlocalized EXAM: CT ABDOMEN AND PELVIS WITHOUT CONTRAST TECHNIQUE: Multidetector CT imaging of the abdomen and pelvis was performed following the standard protocol without IV contrast. RADIATION DOSE REDUCTION: This exam was performed according to the departmental dose-optimization program which includes automated exposure control, adjustment of the mA and/or kV according  to patient size and/or use of iterative reconstruction technique. COMPARISON:  09/04/2021 FINDINGS: Lower chest: No acute abnormality Hepatobiliary: Subtle low-density areas seen anteriorly on image 12, centrally on image 14, and in the right hepatic lobe anteriorly on image 18. These are difficult to characterize without IV contrast. Small cyst in the hepatic dome measures 7 mm. Gallbladder unremarkable. No biliary ductal dilatation. Pancreas: No focal abnormality or ductal dilatation. Spleen: No focal abnormality.  Normal size. Adrenals/Urinary Tract: No adrenal abnormality. No focal renal abnormality. No stones or hydronephrosis. Urinary bladder is unremarkable. Stomach/Bowel: Normal appendix. Stomach, large  and small bowel grossly unremarkable. Vascular/Lymphatic: Scattered aortoiliac atherosclerosis. No evidence of aneurysm or adenopathy. Reproductive: No visible focal abnormality. Other: No free fluid or free air. Musculoskeletal: No acute bony abnormality. IMPRESSION: Subtle ill-defined hypo dense areas within the liver. Differential considerations would include focal fatty infiltration or masses. Recommend further evaluation with contrast-enhanced CT or right upper quadrant ultrasound. Aortoiliac atherosclerosis. Electronically Signed   By: Charlett Nose M.D.   On: 10/16/2022 19:23   US Abdomen Limited  Result Date: 10/16/2022 CLINICAL DATA:  RUQ pain EXAM: ULTRASOUND ABDOMEN LIMITED RIGHT UPPER QUADRANT COMPARISON:  CT scan abdomen and pelvis from 09/04/2021. FINDINGS: Gallbladder: Physiologically distended. There is a sub 4 mm sessile gallbladder polyp. No gallstones or abnormal wall thickening. No pericholecystic free fluid. No sonographic Murphy sign noted by sonographer. Common bile duct: Diameter: Less than 4 mm. Liver: No focal lesion identified. Within normal limits in parenchymal echogenicity. Liver echotexture is within normal limits. However, there is heterogeneous echogenicity with ill-defined  interspersed Iso and hyperechoic areas within the technologist measured at least. 2 ill-defined hyperechoic lesions measured 4.2 x 5.0 x 5.2 cm and 1.9 x 1.9 x 2.6 cm. These are incompletely imaged and characterized on the current examination. Further evaluation with multiphasic contrast-enhanced MRI abdomen as per liver mass protocol is recommended. Portal vein is patent on color Doppler imaging with normal direction of blood flow towards the liver. Other: None. IMPRESSION: 1. No evidence of cholecystitis. 2. Heterogeneous liver with ill-defined hyperechoic lesions. Further evaluation with multiphasic contrast-enhanced MRI abdomen as per liver mass protocol is recommended. 3. There is a single sub 4 mm, low risk gallbladder polyp. Please see follow-up recommendations below. Management of Incidentally Detected Gallbladder Polyps: Society of Radiologists in Ultrasound Consensus Conference Recommendations (published online September 25, 2020): [email or web address] Extremely low-risk polyps, i.e. pedunculated ball-on-the-wall or thin stalk: <9 mm: no follow-up 10-14 mm: follow-up at 6, 12 and 24 months >15 mm: surgical consult Low-risk polyps, i.e. pedunculated with a thick or wide stalk, or sessile: ?6 mm: no follow-up 7-9 mm follow-up ultrasound at 12 months 10-14 mm: follow-up ultrasound at 6, 12, 24, and 36 months vs surgical consult >15 mm: surgical consult Intermediate risk: focal wall-thickening >69mm adjacent to polyp: <6 mm: follow-up at 6, 12, 24, 36 months vs surgical consult >7 mm: surgical consult Electronically Signed   By: Jules Schick M.D.   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 Abdominal pain. Melena. Microcytic anemia. Weight loss. NSAID use. History perforated gastric ulcer s/p surgery with Cheree Ditto patch in 2019.  Plan:   Clear liquid diet, NPO after midnight. PPI, cbc, hydration, transfuse as needed. Endoscopy and  colonoscopy tomorrow 0730 with Dr. Levora Angel. Risks (bleeding, infection, bowel perforation that could require surgery, sedation-related changes in cardiopulmonary systems), benefits (identification and possible treatment of source of symptoms, exclusion of certain causes of symptoms), and alternatives (watchful waiting, radiographic imaging studies, empiric medical treatment) of upper endoscopy (EGD) were explained to patient/family in detail and patient wishes to proceed.  Risks (bleeding, infection, bowel perforation that could require surgery, sedation-related changes in cardiopulmonary systems), benefits (identification and possible treatment of source of symptoms, exclusion of certain causes of symptoms), and alternatives (watchful waiting, radiographic imaging studies, empiric medical treatment) of colonoscopy were explained to patient/family in detail and patient wishes to proceed.  Next step management pending endoscopy/colonoscopy findings. [facility] will follow.   LOS: 0 days   , M  10/17/2022, 2:03 PM  Cell [phone] If no answer or after 5 PM call [phone]

## 2022-10-17 NOTE — TOC CM/SW Note (Signed)
Transition of Care [facility]) - Inpatient Brief Assessment   Patient Details  Name: Marvin Thomas MRN: [id number] Date of Birth: December 13, 1965  [facility] CM/SW Contact:    Otelia Santee, LCSW Phone Number: 10/17/2022, 11:17 AM   Clinical Narrative: Met with pt who shares he has no PCP and is interested in establishing care. PCP appt has been scheduled for 8/15 at 10:20am at [facility]. Information placed on pt's f/u.    Transition of Care Asessment: Insurance and Status: Insurance coverage has been reviewed Patient has primary care physician: No (Apppointment has been scheduled) Home environment has been reviewed: Home w/ roommate Prior level of function:: Independent Prior/Current Home Services: No current home services Social Determinants of Health Reivew: SDOH reviewed no interventions necessary Readmission risk has been reviewed: Yes Transition of care needs: no transition of care needs at this time

## 2022-10-18 ENCOUNTER — Encounter (HOSPITAL_COMMUNITY)
Admission: EM | Disposition: A | Discharge status: Home / Self Care | Payer: Self-pay | Source: Home / Self Care | Attending: Internal Medicine

## 2022-10-18 ENCOUNTER — Inpatient Hospital Stay (HOSPITAL_COMMUNITY): Payer: No Typology Code available for payment source | Admitting: Registered Nurse

## 2022-10-18 ENCOUNTER — Encounter (HOSPITAL_COMMUNITY): Payer: Self-pay | Admitting: Internal Medicine

## 2022-10-18 DIAGNOSIS — F17210 Nicotine dependence, cigarettes, uncomplicated: Secondary | ICD-10-CM

## 2022-10-18 DIAGNOSIS — K631 Perforation of intestine (nontraumatic): Secondary | ICD-10-CM

## 2022-10-18 DIAGNOSIS — R918 Other nonspecific abnormal finding of lung field: Secondary | ICD-10-CM

## 2022-10-18 DIAGNOSIS — J449 Chronic obstructive pulmonary disease, unspecified: Secondary | ICD-10-CM

## 2022-10-18 HISTORY — PX: ESOPHAGOGASTRODUODENOSCOPY (EGD) WITH PROPOFOL: SHX5813

## 2022-10-18 HISTORY — PX: BIOPSY: SHX5522

## 2022-10-18 HISTORY — PX: COLONOSCOPY WITH PROPOFOL: SHX5780

## 2022-10-18 HISTORY — PX: SUBMUCOSAL TATTOO INJECTION: SHX6856

## 2022-10-18 LAB — HEMOGLOBIN AND HEMATOCRIT, BLOOD
HCT: 24.6 % — ABNORMAL LOW (ref 39.0–52.0)
Hemoglobin: 7.4 g/dL — ABNORMAL LOW (ref 13.0–17.0)

## 2022-10-18 LAB — PREPARE RBC (CROSSMATCH)

## 2022-10-18 SURGERY — ESOPHAGOGASTRODUODENOSCOPY (EGD) WITH PROPOFOL
Anesthesia: Monitor Anesthesia Care

## 2022-10-18 MED ORDER — LACTATED RINGERS IV SOLN: INTRAVENOUS | Status: DC | PRN

## 2022-10-18 MED ORDER — PROPOFOL 500 MG/50ML IV EMUL
INTRAVENOUS | Status: AC
  Filled 2022-10-18: qty 50

## 2022-10-18 MED ORDER — FENTANYL CITRATE (PF) 100 MCG/2ML IJ SOLN: 25.0000 ug | INTRAMUSCULAR | Status: DC | PRN

## 2022-10-18 MED ORDER — ONDANSETRON HCL 4 MG/2ML IJ SOLN
4.0000 mg | Freq: Four times a day (QID) | INTRAMUSCULAR | Status: AC | PRN
  Administered 2022-10-22: 4 mg via INTRAVENOUS
  Filled 2022-10-18: qty 2

## 2022-10-18 MED ORDER — PROPOFOL 500 MG/50ML IV EMUL
INTRAVENOUS | Status: DC | PRN
  Administered 2022-10-18: 130 ug/kg/min via INTRAVENOUS

## 2022-10-18 MED ORDER — MAGNESIUM SULFATE 2 GM/50ML IV SOLN
2.0000 g | Freq: Once | INTRAVENOUS | Status: AC
  Administered 2022-10-18: 2 g via INTRAVENOUS
  Filled 2022-10-18: qty 50

## 2022-10-18 MED ORDER — SPOT INK MARKER SYRINGE KIT
PACK | SUBMUCOSAL | Status: DC | PRN
Start: 2022-10-18 — End: 2022-10-18
  Administered 2022-10-18: 2 mL via SUBMUCOSAL

## 2022-10-18 MED ORDER — ALBUTEROL SULFATE (2.5 MG/3ML) 0.083% IN NEBU: 2.5000 mg | INHALATION_SOLUTION | RESPIRATORY_TRACT | Status: DC | PRN

## 2022-10-18 MED ORDER — OXYCODONE HCL 5 MG PO TABS: 5.0000 mg | ORAL_TABLET | Freq: Once | ORAL | Status: AC | PRN

## 2022-10-18 MED ORDER — LIDOCAINE 2% (20 MG/ML) 5 ML SYRINGE
INTRAMUSCULAR | Status: DC | PRN
  Administered 2022-10-18: 60 mg via INTRAVENOUS

## 2022-10-18 MED ORDER — PROPOFOL 1000 MG/100ML IV EMUL
INTRAVENOUS | Status: AC
  Filled 2022-10-18: qty 100

## 2022-10-18 MED ORDER — OXYCODONE HCL 5 MG/5ML PO SOLN
5.0000 mg | Freq: Once | ORAL | Status: AC | PRN
  Administered 2022-10-18: 5 mg via ORAL
  Filled 2022-10-18 (×2): qty 5

## 2022-10-18 MED ORDER — PROPOFOL 10 MG/ML IV BOLUS
INTRAVENOUS | Status: DC | PRN
  Administered 2022-10-18: 20 mg via INTRAVENOUS
  Administered 2022-10-18: 10 mg via INTRAVENOUS

## 2022-10-18 MED ORDER — SODIUM CHLORIDE 0.9% IV SOLUTION: Freq: Once | INTRAVENOUS | Status: AC

## 2022-10-18 MED ORDER — UMECLIDINIUM-VILANTEROL 62.5-25 MCG/ACT IN AEPB
1.0000 | INHALATION_SPRAY | Freq: Every day | RESPIRATORY_TRACT | Status: DC
  Administered 2022-10-19 – 2022-10-24 (×6): 1 via RESPIRATORY_TRACT
  Filled 2022-10-18: qty 14

## 2022-10-18 SURGICAL SUPPLY — 25 items

## 2022-10-18 NOTE — Plan of Care (Signed)
  Problem: Clinical Measurements: Goal: Ability to maintain clinical measurements within normal limits will improve Outcome: Progressing Goal: Will remain free from infection Outcome: Progressing Goal: Diagnostic test results will improve Outcome: Progressing   Problem: Coping: Goal: Level of anxiety will decrease Outcome: Progressing   

## 2022-10-18 NOTE — Op Note (Signed)
[facility] Patient Name: Marvin Thomas Procedure Date: 10/18/2022 MRN: [id number] Attending MD: Kathi Der , MD, [id number] Date of Birth: 12-10-1965 CSN: [id number] Age: 57 Admit Type: Inpatient Procedure:                Upper GI endoscopy Indications:              Abdominal pain, Iron deficiency anemia Providers:                Kathi Der, MD, Eliberto Ivory, RN, Margaree Mackintosh, RN, Marja Kays, Technician Referring MD:              Medicines:                Sedation Administered by an Anesthesia Professional Complications:            No immediate complications. Estimated Blood Loss:     Estimated blood loss was minimal. Procedure:                Pre-Anesthesia Assessment:                           - Prior to the procedure, a History and Physical                            was performed, and patient medications and                            allergies were reviewed. The patient's tolerance of                            previous anesthesia was also reviewed. The risks                            and benefits of the procedure and the sedation                            options and risks were discussed with the patient.                            All questions were answered, and informed consent                            was obtained. Prior Anticoagulants: The patient has                            taken no anticoagulant or antiplatelet agents. ASA                            Grade Assessment: III - A patient with severe                            systemic disease. After reviewing the risks and  benefits, the patient was deemed in satisfactory                            condition to undergo the procedure.                           After obtaining informed consent, the endoscope was                            passed under direct vision. Throughout the                            procedure, the  patient's blood pressure, pulse, and                            oxygen saturations were monitored continuously. The                            [id number]-[id number] ([id number]) Olympus endoscope was introduced                            through the mouth, and advanced to the second part                            of duodenum. The upper GI endoscopy was                            accomplished without difficulty. The patient                            tolerated the procedure well. Scope In: Scope Out: Findings:      The Z-line was regular and was found 38 cm from the incisors.      One superficial gastric ulcer was found in the prepyloric region of the       stomach. The lesion was 5 mm in largest dimension. Biopsies were taken       with a cold forceps for histology.      The cardia and gastric fundus were normal on retroflexion.      One cratered duodenal ulcer with pigmented material was found in the       duodenal bulb. The lesion was 15 mm in largest dimension. Biopsies were       taken with a cold forceps for histology.      The second portion of the duodenum was normal. Impression:               - Z-line regular, 38 cm from the incisors.                           - Gastric ulcer. Biopsied.                           - Duodenal ulcer with pigmented material. Biopsied.                           - Normal second portion of the  duodenum. Moderate Sedation:      Moderate (conscious) sedation was personally administered by an       anesthesia professional. The following parameters were monitored: oxygen       saturation, heart rate, blood pressure, and response to care. Recommendation:           - Perform a colonoscopy today. Procedure Code(s):        --- Professional ---                           [phone], Esophagogastroduodenoscopy, flexible,                            transoral; with biopsy, single or multiple Diagnosis Code(s):        --- Professional ---                           K25.9, Gastric  ulcer, unspecified as acute or                            chronic, without hemorrhage or perforation                           K26.9, Duodenal ulcer, unspecified as acute or                            chronic, without hemorrhage or perforation                           R10.9, Unspecified abdominal pain                           D50.9, Iron deficiency anemia, unspecified CPT copyright 2022 American Medical Association. All rights reserved. The codes documented in this report are preliminary and upon coder review may  be revised to meet current compliance requirements. Kathi Der, MD Kathi Der, MD 10/18/2022 8:23:35 AM Number of Addenda: 0

## 2022-10-18 NOTE — Transfer of Care (Signed)
Immediate Anesthesia Transfer of Care Note  Patient: Marvin Thomas  Procedure(s) Performed: ESOPHAGOGASTRODUODENOSCOPY (EGD) WITH PROPOFOL COLONOSCOPY WITH PROPOFOL BIOPSY SUBMUCOSAL TATTOO INJECTION  Patient Location: PACU  Anesthesia Type:MAC  Level of Consciousness: awake, alert , oriented, and patient cooperative  Airway & Oxygen Therapy: Patient Spontanous Breathing and Patient connected to face mask oxygen  Post-op Assessment: Report given to RN, Post -op Vital signs reviewed and stable, and Patient moving all extremities  Post vital signs: Reviewed and stable  Last Vitals:  Vitals Value Taken Time  BP    Temp    Pulse    Resp    SpO2      Last Pain:  Vitals:   10/18/22 0712  TempSrc: Temporal  PainSc: 2          Complications: No notable events documented.

## 2022-10-18 NOTE — Op Note (Signed)
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 Estimated Blood Loss:     Estimated blood loss was minimal. Procedure:                Pre-Anesthesia Assessment:                           - Prior to the procedure, a History and Physical                            was performed, and patient medications and                            allergies were reviewed. The patient's tolerance of                            previous anesthesia was also reviewed. The risks                            and benefits of the procedure and the sedation                            options and risks were discussed with the patient.                            All questions were answered, and informed consent                            was obtained. Prior Anticoagulants: The patient has                            taken no anticoagulant or antiplatelet agents. ASA                            Grade Assessment: III - A patient with severe  systemic disease. After reviewing the risks and                            benefits, the patient was deemed in satisfactory                            condition to undergo the procedure.                           After obtaining informed consent, the colonoscope                            was passed under direct vision. Throughout  the                            procedure, the patient's blood pressure, pulse, and                            oxygen saturations were monitored continuously. The                            PCF-HQ190L ([id number] Olympus colonoscope was                            introduced through the anus with the intention of                            advancing to the cecum. The scope was advanced to                            the transverse colon before the procedure was                            aborted. Medications were given. The colonoscopy                            was performed with difficulty due to poor bowel                            prep with stool present. The patient tolerated the                            procedure well. The quality of the bowel                            preparation was poor. The rectum was photographed. Scope In: 8:04:29 AM Scope Out: 8:17:28 AM Total Procedure Duration: 0 hours 12 minutes 59 seconds  Findings:      The perianal and digital rectal examinations were normal.      Copious quantities of semi-liquid semi-solid stool was found in the       entire colon, precluding visualization. Lavage of the area was performed       using a large amount, resulting in incomplete clearance with continued  poor visualization.      A frond-like/villous and polypoid non-obstructing large mass was found       in the transverse colon. The mass was non-circumferential. No bleeding       was present. Biopsies were taken with a cold forceps for histology. Area       was tattooed with an injection of Spot (carbon black).      Internal hemorrhoids were found during retroflexion. Impression:               - Preparation of the colon was poor.                           - Stool in the entire examined colon.                           - Tumor in the transverse colon. Biopsied. Tattooed.                           - Internal hemorrhoids. Moderate Sedation:      Moderate (conscious)  sedation was personally administered by an       anesthesia professional. The following parameters were monitored: oxygen       saturation, heart rate, blood pressure, and response to care. Recommendation:           - Return patient to hospital ward for ongoing care.                           - Soft diet.                           - Continue present medications.                           - Refer to an interventional radiologist at                            appointment to be scheduled. Procedure Code(s):        --- Professional ---                           [phone], 52, Colonoscopy, flexible; with biopsy,                            single or multiple                           [id number], 52, Colonoscopy, flexible; with directed                            submucosal injection(s), any substance Diagnosis Code(s):        --- Professional ---                           K64.8, Other hemorrhoids                           D49.0, Neoplasm of unspecified behavior of  digestive system                           D50.9, Iron deficiency anemia, unspecified CPT copyright 2022 [employer]. All rights reserved. The codes documented in this report are preliminary and upon coder review may  be revised to meet current compliance requirements. Kathi Der, MD Kathi Der, MD 10/18/2022 8:28:24 AM Number of Addenda: 0

## 2022-10-18 NOTE — Brief Op Note (Signed)
10/18/2022  8:28 AM  PATIENT:  Marvin Thomas  57 y.o. male  PRE-OPERATIVE DIAGNOSIS:  Liver lesion  POST-OPERATIVE DIAGNOSIS:  EDG- large ulcer Colon-Poor Prep  PROCEDURE:  Procedure(s): ESOPHAGOGASTRODUODENOSCOPY (EGD) WITH PROPOFOL (N/A) COLONOSCOPY WITH PROPOFOL (N/A) BIOPSY SUBMUCOSAL TATTOO INJECTION  SURGEON:  Surgeons and Role:    * , , MD - Primary  Findings ---------- -EGD showed large duodenal bulb ulcer without any active bleeding.  Biopsies taken.  Tiny superficial prepyloric gastric ulcer.  Biopsies taken.  -Colonoscopy showed poor prep and large polypoid masslike lesion either in transverse colon or proximal descending colon.  Not able to confirm the location as cecum was not reached because of poor prep.  Biopsies taken.  Tattoo performed.  Recommendations ------------------------- -Recommend IR consult for liver biopsy -CEA -Okay to have soft diet and advance as tolerated -No further inpatient GI workup planned.  GI will sign off.  Call us back if needed and if liver biopsy results are inconclusive.  Kathi Der MD, FACP 10/18/2022, 8:30 AM  Contact #  [phone]

## 2022-10-18 NOTE — Anesthesia Preprocedure Evaluation (Signed)
Anesthesia Evaluation  Patient identified by MRN, date of birth, ID band Patient awake    Reviewed: Allergy & Precautions, H&P , NPO status , Patient's Chart, lab work & pertinent test results  Airway Mallampati: II   Neck ROM: full    Dental   Pulmonary COPD, Current Smoker   breath sounds clear to auscultation       Cardiovascular negative cardio ROS  Rhythm:regular Rate:Normal     Neuro/Psych    GI/Hepatic PUD,GERD  ,,Liver lesion   Endo/Other    Renal/GU      Musculoskeletal   Abdominal   Peds  Hematology  (+) Blood dyscrasia, anemia Hemoglobin 8.9   Anesthesia Other Findings   Reproductive/Obstetrics                             Anesthesia Physical Anesthesia Plan  ASA: 3  Anesthesia Plan: MAC   Post-op Pain Management:    Induction: Intravenous  PONV Risk Score and Plan: 0 and Propofol infusion and Treatment may vary due to age or medical condition  Airway Management Planned: Nasal Cannula  Additional Equipment:   Intra-op Plan:   Post-operative Plan:   Informed Consent: I have reviewed the patients History and Physical, chart, labs and discussed the procedure including the risks, benefits and alternatives for the proposed anesthesia with the patient or authorized representative who has indicated his/her understanding and acceptance.     Dental advisory given  Plan Discussed with: CRNA, Anesthesiologist and Surgeon  Anesthesia Plan Comments:        Anesthesia Quick Evaluation
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
## 2022-10-18 NOTE — Interval H&P Note (Signed)
History and Physical Interval Note:  10/18/2022 7:42 AM  Marvin Thomas  has presented today for surgery, with the diagnosis of Liver lesion.  The various methods of treatment have been discussed with the patient and family. After consideration of risks, benefits and other options for treatment, the patient has consented to  Procedure(s): ESOPHAGOGASTRODUODENOSCOPY (EGD) WITH PROPOFOL (N/A) COLONOSCOPY WITH PROPOFOL (N/A) as a surgical intervention.  The patient's history has been reviewed, patient examined, no change in status, stable for surgery.  I have reviewed the patient's chart and labs.  Questions were answered to the patient's satisfaction.      

## 2022-10-18 NOTE — Progress Notes (Signed)
PROGRESS NOTE    Marvin Thomas  WUJ:[id number] DOB: 30-Dec-1965 DOA: 10/16/2022 PCP: Pcp, No   Brief Narrative: 57 year old past medical history significant for COPD, chronic anemia, gallstone, hemothorax, GERD, PUD/perforated pyloric ulcer status postrepair 2019, substance abuse (marijuana tobacco) presents complaining of right upper quadrant abdominal pain worse after eating or drinking.  He was found to have hemoglobin of 8.9 previously 10 and 12, AKI with a creatinine of 1.4, normal lipase and liver function test.  Right upper quadrant ultrasound showed hyperechoic lesion and a single sip 4 mm low risk gallbladder polyp.  Patient also reports melena.  MRI of the liver showed 4 aggressive appearing hepatic lesion which have imaging characteristics most suggestive of metastatic disease to the liver.  Further clinical evaluation to establish as source of primary malignancies recommended   Assessment & Plan:   Principal Problem:   Abdominal pain Active Problems:   COPD (chronic obstructive pulmonary disease) (HCC)   Acute on chronic anemia   GI bleed   AKI (acute kidney injury) (HCC)   Bradycardia  1-Epigastric pain Acute on chronic blood loss anemia, Iron deficiency anemia;  Melena, GI bleed: -Continue with IV Protonix.  -Underwent Endoscopy, colonoscopy : Found to have Large duodenal ulcer, non bleeding, multiple small gastric ulcers. Large Polypoid mass like transverse or proximal colon. Awaiting Biopsy.  Hb down to 7. Will give one unit PRBC>   2-AKI: -In setting of poor oral intake.  -Presented with Cr 1.4 Trending down.  -Continue with IV fluids.    3-Multiple liver lesions,  Lung Mass:  -Plan for endoscopy and colonoscopy tomorrow.  -CT chest: 3 cm mass in the posterior right upper lobe with some dystrophic calcification. In addition there is an abnormal precarinal lymph node. 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  -Pulmonology consulted.   -Discussed with Dr Arbutus Ped, he recommend Liver biopsy.  -IR consulted for liver Biopsy.   4-Bradycardia -Asymptomatic.  -Mg pending.  -TSH: 1.018  Hypertension: will start Norvasc.   Mild hyponatremia: -Continue with IV fluids.   Abnormal urinalysis pyuria: -Follow Urine Culture.   COPD: -PRN nebulizer.        Estimated body mass index is 17.45 kg/m as calculated from the following:   Height as of this encounter: 5\' 9"  (1.753 m).   Weight as of this encounter: 53.6 kg.   DVT prophylaxis: SCD Code Status: Full code Family Communication: Care discussed with patient.  Disposition Plan:  Status is: Observation The patient will require care spanning > 2 midnights and should be moved to inpatient because: management of anemia. AKI    Consultants:  GI  Procedures:    Antimicrobials:    Subjective: He came from endoscopy, denies pain.  Denies dyspnea. We discussed CT chest result.   Objective: Vitals:   10/17/22 0636 10/17/22 2052 10/18/22 0441 10/18/22 0712  BP:  125/64 117/62 (!) 114/39  Pulse:  67 65 61  Resp:  18 18 19   Temp:  98.2 F (36.8 C) 98.5 F (36.9 C) 97.7 F (36.5 C)  TempSrc:  Oral Oral Temporal  SpO2: 97% 97% 98% 100%  Weight:    53.6 kg  Height:    5\' 9"  (1.753 m)    Intake/Output Summary (Last 24 hours) at 10/18/2022 0736 Last data filed at 10/18/2022 0300 Gross per 24 hour  Intake 1720 ml  Output 1150 ml  Net 570 ml   Filed Weights   10/16/22 2122 10/18/22 0712  Weight: 53.6 kg 53.6 kg  Examination:  General exam: NAD Respiratory system: CTA Cardiovascular system: S,1 S 2 RRR Gastrointestinal system: SB present, soft, nt Central nervous system: Alert, oriented.  Extremities: Symmetric 5 x 5 power.   Data Reviewed: I have personally reviewed following labs and imaging studies  CBC: Recent Labs  Lab 10/16/22 1338 10/17/22 0509  WBC 6.1 7.6  NEUTROABS 4.6  --   HGB 8.9* 8.9*  HCT 29.8* 30.5*  MCV 70.0*  72.3*  PLT 434* 328   Basic Metabolic Panel: Recent Labs  Lab 10/16/22 1338 10/17/22 0509  NA 133* 132*  K 3.8 4.3  CL 101 103  CO2 22 19*  GLUCOSE 92 77  BUN 36* 29*  CREATININE 1.47* 1.01  CALCIUM 9.0 8.6*   GFR: Estimated Creatinine Clearance: 61.2 mL/min (by C-G formula based on SCr of 1.01 mg/dL). Liver Function Tests: Recent Labs  Lab 10/16/22 1338  AST 17  ALT 13  ALKPHOS 97  BILITOT 0.8  PROT 8.1  ALBUMIN 4.1   Recent Labs  Lab 10/16/22 1338  LIPASE 30   No results for input(s): "AMMONIA" in the last 168 hours. Coagulation Profile: No results for input(s): "INR", "PROTIME" in the last 168 hours. Cardiac Enzymes: No results for input(s): "CKTOTAL", "CKMB", "CKMBINDEX", "TROPONINI" in the last 168 hours. BNP (last 3 results) No results for input(s): "PROBNP" in the last 8760 hours. HbA1C: No results for input(s): "HGBA1C" in the last 72 hours. CBG: No results for input(s): "GLUCAP" in the last 168 hours. Lipid Profile: No results for input(s): "CHOL", "HDL", "LDLCALC", "TRIG", "CHOLHDL", "LDLDIRECT" in the last 72 hours. Thyroid Function Tests: Recent Labs    10/17/22 1541  TSH 1.018   Anemia Panel: Recent Labs    10/17/22 1541  VITAMINB12 346  FOLATE 11.2  FERRITIN 7*  TIBC 336  IRON 23*  RETICCTPCT 1.7   Sepsis Labs: No results for input(s): "PROCALCITON", "LATICACIDVEN" in the last 168 hours.  Recent Results (from the past 240 hour(s))  Urine Culture (for pregnant, neutropenic or urologic patients or patients with an indwelling urinary catheter)     Status: None (Preliminary result)   Collection Time: 10/16/22  8:43 PM   Specimen: Urine, Clean Catch  Result Value Ref Range Status   Specimen Description   Final    URINE, CLEAN CATCH Performed at [facility], [address] [place], [place] [address]    Special Requests   Final    NONE Performed at [facility], [address]  [place], [place] [address]    Culture   Final    CULTURE REINCUBATED FOR BETTER GROWTH Performed at [facility], [address], [place], [place] [address]    Report Status PENDING  Incomplete         Radiology Studies: CT CHEST W CONTRAST  Result Date: 10/17/2022 CLINICAL DATA:  Staging hepatocellular carcinoma. * Tracking Code: BO * EXAM: CT CHEST WITH CONTRAST TECHNIQUE: Multidetector CT imaging of the chest was performed during intravenous contrast administration. RADIATION DOSE REDUCTION: This exam was performed according to the departmental dose-optimization program which includes automated exposure control, adjustment of the mA and/or kV according to patient size and/or use of iterative reconstruction technique. CONTRAST:  75mL OMNIPAQUE IOHEXOL 300 MG/ML  SOLN COMPARISON:  CT angiogram chest 09/04/2021. Abdomen and pelvis imaging 10/16/2018 FINDINGS: Cardiovascular: Thoracic aorta has a normal course and caliber with slight atherosclerotic plaque along the descending thoracic aorta. Coronary artery calcifications are also seen. Please correlate  for other coronary risk factors. The heart nonenlarged. Trace pericardial effusion. Mediastinum/Nodes: No specific abnormal lymph node enlargement seen in the axillary region, hilum. There is an enlarged lymph node precarinal on series 2 image 65 measuring 14 x 21 mm. No other abnormal lymph node enlargement clearly seen in the mediastinum. Lungs/Pleura: Paraseptal and centrilobular emphysematous lung changes are identified. No pneumothorax, effusion or consolidation. Areas of scarring and fibrotic changes. There is a nodule along the right upper lobe medially with some calcifications and pleural thickening measuring 3.0 x 1.9 cm on series 6, image 36. This has not seen on the prior examination. Numerous location previously there is a large bulla which is no longer identified. In principle, mass lesion is not excluded. Upper Abdomen: Again mass lesion  seen in the liver. Please correlate for prior workup. Musculoskeletal: Scattered degenerative changes are seen along the spine. IMPRESSION: 3 cm mass in the posterior right upper lobe with some dystrophic calcification. In addition there is an abnormal precarinal lymph node. Malignancy is possible. Recommend further workup such as PET-CT scan when clinically appropriate. 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 The previous larger bulla in the right lung apex is not seen on the current study. Coronary artery calcifications. Please correlate for other coronary risk factors. Aortic Atherosclerosis (ICD10-I70.0) and Emphysema (ICD10-J43.9). Electronically Signed   By: Karen Kays M.D.   On: 10/17/2022 15:45   MR LIVER W WO CONTRAST  Result Date: 10/17/2022 CLINICAL DATA:  57 year old male with history of multiple indeterminate liver lesions noted on prior CT examination. Follow-up study. EXAM: MRI ABDOMEN WITHOUT AND WITH CONTRAST TECHNIQUE: Multiplanar multisequence MR imaging of the abdomen was performed both before and after the administration of intravenous contrast. CONTRAST:  6mL GADAVIST GADOBUTROL 1 MMOL/ML IV SOLN COMPARISON:  No prior abdominal MRI. CT of the abdomen and pelvis 10/16/2022. FINDINGS: Lower chest: Unremarkable. Hepatobiliary: There are 4 lesions in the liver which are low T1 signal intensity, high T2 signal intensity, demonstrates restricted diffusion, and demonstrate heterogeneous enhancement on post gadolinium imaging generally centrally hypovascular with peripheral enhancement, highly suspicious for metastatic disease. The largest of these is located in the right lobe of the liver predominantly centered in segment 8 (axial image 28 of series 10) estimated to measure approximately 5.0 x 4.1 cm, with additional lesions in segment 4A (axial image 20 of series 10), central aspect of segment 8 (axial image 21 of series 10), and inferior aspect of segment 6 (axial image 51 of  series 10). A few other scattered tiny subcentimeter T1 hypointense, T2 hyperintense, nonenhancing lesions are noted in the liver, compatible with tiny cysts and/or biliary hamartomas. No intra or extrahepatic biliary ductal dilatation. Gallbladder is moderately distended. There is some dependent amorphous T1 hyperintense material which may represent some biliary sludge. Gallbladder wall does not appear thickened. No definite gallstones. Pancreas: No pancreatic mass. No pancreatic ductal dilatation. No pancreatic or peripancreatic fluid collections or inflammatory changes. Spleen:  Unremarkable. Adrenals/Urinary Tract: Bilateral kidneys and adrenal glands are normal in appearance. No hydroureteronephrosis in the visualized portions of the abdomen. Stomach/Bowel: Visualized portions are unremarkable. Vascular/Lymphatic: No aneurysm identified in the visualized abdominal vasculature. No definite lymphadenopathy noted in the abdomen. Other: No significant volume of ascites noted in the visualized portions of the peritoneal cavity. Musculoskeletal: No aggressive appearing osseous lesions are noted in the visualized portions of the skeleton. IMPRESSION: 1. Four aggressive appearing hepatic lesions which have imaging characteristics most suggestive of metastatic disease to the liver. Further clinical  evaluation to establish a source of primary malignancy is recommended. Electronically Signed   By: Trudie Reed M.D.   On: 10/17/2022 05:29   CT ABDOMEN PELVIS WO CONTRAST  Result Date: 10/16/2022 CLINICAL DATA:  Abdominal pain, acute, nonlocalized EXAM: CT ABDOMEN AND PELVIS WITHOUT CONTRAST TECHNIQUE: Multidetector CT imaging of the abdomen and pelvis was performed following the standard protocol without IV contrast. RADIATION DOSE REDUCTION: This exam was performed according to the departmental dose-optimization program which includes automated exposure control, adjustment of the mA and/or kV according to patient  size and/or use of iterative reconstruction technique. COMPARISON:  09/04/2021 FINDINGS: Lower chest: No acute abnormality Hepatobiliary: Subtle low-density areas seen anteriorly on image 12, centrally on image 14, and in the right hepatic lobe anteriorly on image 18. These are difficult to characterize without IV contrast. Small cyst in the hepatic dome measures 7 mm. Gallbladder unremarkable. No biliary ductal dilatation. Pancreas: No focal abnormality or ductal dilatation. Spleen: No focal abnormality.  Normal size. Adrenals/Urinary Tract: No adrenal abnormality. No focal renal abnormality. No stones or hydronephrosis. Urinary bladder is unremarkable. Stomach/Bowel: Normal appendix. Stomach, large and small bowel grossly unremarkable. Vascular/Lymphatic: Scattered aortoiliac atherosclerosis. No evidence of aneurysm or adenopathy. Reproductive: No visible focal abnormality. Other: No free fluid or free air. Musculoskeletal: No acute bony abnormality. IMPRESSION: Subtle ill-defined hypo dense areas within the liver. Differential considerations would include focal fatty infiltration or masses. Recommend further evaluation with contrast-enhanced CT or right upper quadrant ultrasound. Aortoiliac atherosclerosis. Electronically Signed   By: Charlett Nose M.D.   On: 10/16/2022 19:23   US Abdomen Limited  Result Date: 10/16/2022 CLINICAL DATA:  RUQ pain EXAM: ULTRASOUND ABDOMEN LIMITED RIGHT UPPER QUADRANT COMPARISON:  CT scan abdomen and pelvis from 09/04/2021. FINDINGS: Gallbladder: Physiologically distended. There is a sub 4 mm sessile gallbladder polyp. No gallstones or abnormal wall thickening. No pericholecystic free fluid. No sonographic Murphy sign noted by sonographer. Common bile duct: Diameter: Less than 4 mm. Liver: No focal lesion identified. Within normal limits in parenchymal echogenicity. Liver echotexture is within normal limits. However, there is heterogeneous echogenicity with ill-defined  interspersed Iso and hyperechoic areas within the technologist measured at least. 2 ill-defined hyperechoic lesions measured 4.2 x 5.0 x 5.2 cm and 1.9 x 1.9 x 2.6 cm. These are incompletely imaged and characterized on the current examination. Further evaluation with multiphasic contrast-enhanced MRI abdomen as per liver mass protocol is recommended. Portal vein is patent on color Doppler imaging with normal direction of blood flow towards the liver. Other: None. IMPRESSION: 1. No evidence of cholecystitis. 2. Heterogeneous liver with ill-defined hyperechoic lesions. Further evaluation with multiphasic contrast-enhanced MRI abdomen as per liver mass protocol is recommended. 3. There is a single sub 4 mm, low risk gallbladder polyp. Please see follow-up recommendations below. Management of Incidentally Detected Gallbladder Polyps: Society of Radiologists in Ultrasound Consensus Conference Recommendations (published online September 25, 2020): [email or web address] Extremely low-risk polyps, i.e. pedunculated ball-on-the-wall or thin stalk: <9 mm: no follow-up 10-14 mm: follow-up at 6, 12 and 24 months >15 mm: surgical consult Low-risk polyps, i.e. pedunculated with a thick or wide stalk, or sessile: ?6 mm: no follow-up 7-9 mm follow-up ultrasound at 12 months 10-14 mm: follow-up ultrasound at 6, 12, 24, and 36 months vs surgical consult >15 mm: surgical consult Intermediate risk: focal wall-thickening >20mm adjacent to polyp: <6 mm: follow-up at 6, 12, 24, 36 months vs surgical consult >7 mm: surgical consult Electronically Signed   By: Timoteo Expose.D.  On: 10/16/2022 15:32        Scheduled Meds:  [MAR Hold] amLODipine  5 mg Oral Daily   [MAR Hold] feeding supplement  1 Container Oral TID BM   [MAR Hold] pantoprazole (PROTONIX) IV  40 mg Intravenous Q12H   Continuous Infusions:  sodium chloride 100 mL/hr at 10/18/22 0032     LOS: 1 day    Time spent: 35 minutes.     Alba Cory, MD [facility]   If 7PM-7AM, please contact night-coverage [email or web address]  10/18/2022, 7:36 AM

## 2022-10-18 NOTE — Anesthesia Procedure Notes (Signed)
Procedure Name: MAC Date/Time: 10/18/2022 7:44 AM  Performed by: Elisabeth Cara, CRNAPre-anesthesia Checklist: Patient identified, Emergency Drugs available, Suction available, Patient being monitored and Timeout performed Patient Re-evaluated:Patient Re-evaluated prior to induction Oxygen Delivery Method: Simple face mask Placement Confirmation: CO2 detector Dental Injury: Teeth and Oropharynx as per pre-operative assessment

## 2022-10-19 LAB — CBC
HCT: 24.6 % — ABNORMAL LOW (ref 39.0–52.0)
Hemoglobin: 7.5 g/dL — ABNORMAL LOW (ref 13.0–17.0)
MCH: 21.8 pg — ABNORMAL LOW (ref 26.0–34.0)
MCHC: 30.5 g/dL (ref 30.0–36.0)
MCV: 71.5 fL — ABNORMAL LOW (ref 80.0–100.0)
Platelets: 270 10*3/uL (ref 150–400)
RBC: 3.44 MIL/uL — ABNORMAL LOW (ref 4.22–5.81)
RDW: 16.8 % — ABNORMAL HIGH (ref 11.5–15.5)
WBC: 5.6 10*3/uL (ref 4.0–10.5)
nRBC: 0 % (ref 0.0–0.2)

## 2022-10-19 LAB — BASIC METABOLIC PANEL
Anion gap: 8 (ref 5–15)
BUN: 17 mg/dL (ref 6–20)
CO2: 22 mmol/L (ref 22–32)
Calcium: 8.3 mg/dL — ABNORMAL LOW (ref 8.9–10.3)
Chloride: 104 mmol/L (ref 98–111)
Creatinine, Ser: 0.82 mg/dL (ref 0.61–1.24)
GFR, Estimated: >60 mL/min (ref >60)
Glucose, Bld: 91 mg/dL (ref 70–99)
Potassium: 3.5 mmol/L (ref 3.5–5.1)
Sodium: 134 mmol/L — ABNORMAL LOW (ref 135–145)

## 2022-10-19 LAB — MAGNESIUM: Magnesium: 1.8 mg/dL (ref 1.7–2.4)

## 2022-10-19 MED ORDER — VITAMIN B-12 1000 MCG PO TABS
1000.0000 ug | ORAL_TABLET | Freq: Every day | ORAL | Status: DC
  Administered 2022-10-19 – 2022-10-24 (×6): 1000 ug via ORAL
  Filled 2022-10-19 (×6): qty 1

## 2022-10-19 MED ORDER — FOLIC ACID 1 MG PO TABS
1.0000 mg | ORAL_TABLET | Freq: Every day | ORAL | Status: DC
  Administered 2022-10-19 – 2022-10-24 (×6): 1 mg via ORAL
  Filled 2022-10-19 (×6): qty 1

## 2022-10-19 MED ORDER — SODIUM CHLORIDE 0.9 % IV SOLN
250.0000 mg | Freq: Once | INTRAVENOUS | Status: AC
  Administered 2022-10-19: 250 mg via INTRAVENOUS
  Filled 2022-10-19: qty 20

## 2022-10-19 MED ORDER — MAGNESIUM SULFATE 2 GM/50ML IV SOLN
2.0000 g | Freq: Once | INTRAVENOUS | Status: AC
  Administered 2022-10-19: 2 g via INTRAVENOUS
  Filled 2022-10-19: qty 50

## 2022-10-19 NOTE — Plan of Care (Signed)

## 2022-10-19 NOTE — Consult Note (Addendum)
Chief Complaint: Consultation requested for liver lesion bx   Referring Physician(s): Dr. Levora Angel  Supervising Physician: Gilmer Mor  Patient Status: [facility] - In-pt  History of Present Illness: Marvin Thomas is a 57 y.o. male admitted with abdominal pain and anemia. Pt underwent endoscopy finding duodenal bulb ulcer without active bleeding. Biopsies taken. Also finds colon mass, biopsies taken.  Additional imaging workup finds multiple liver lesions and lung nodule concerning for metastatic process.  IR is asked to perform liver biopsy for tissue diagnosis.  PMHx, meds, labs, imaging, allergies reviewed.     Past Medical History:  Diagnosis Date   Anemia Dx 2014   Gallstones    Hemothorax    Ulcer     Past Surgical History:  Procedure Laterality Date   LAPAROTOMY N/A 12/09/2017   Procedure: EXPLORATORY LAPAROTOMY, REPAIR PERFORATED PYLEORIC ULCER;  Surgeon: Almond Lint, MD;  Location: [facility];  Service: General;  Laterality: N/A;    Allergies: Aspirin  Medications:  Current Facility-Administered Medications:    acetaminophen (TYLENOL) tablet 650 mg, 650 mg, Oral, Q6H PRN, 650 mg at 10/18/22 0951 **OR** acetaminophen (TYLENOL) suppository 650 mg, 650 mg, Rectal, Q6H PRN, John Giovanni, MD   albuterol (PROVENTIL) (2.5 MG/3ML) 0.083% nebulizer solution 2.5 mg, 2.5 mg, Nebulization, Q4H PRN, Luciano Cutter, MD   amLODipine (NORVASC) tablet 5 mg, 5 mg, Oral, Daily, Regalado, Belkys A, MD, 5 mg at 10/18/22 0952   feeding supplement (BOOST / RESOURCE BREEZE) liquid 1 Container, 1 Container, Oral, TID BM, Regalado, Belkys A, MD, 1 Container at 10/18/22 1954   fentaNYL (SUBLIMAZE) injection 25-50 mcg, 25-50 mcg, Intravenous, Q5 min PRN, Hodierne, Adam, MD   HYDROmorphone (DILAUDID) injection 1 mg, 1 mg, Intravenous, Q4H PRN, John Giovanni, MD, 1 mg at 10/17/22 0813   ipratropium-albuterol (DUONEB) 0.5-2.5 (3) MG/3ML nebulizer solution 3 mL, 3 mL,  Nebulization, Q6H PRN, Regalado, Belkys A, MD   naloxone (NARCAN) injection 0.4 mg, 0.4 mg, Intravenous, PRN, John Giovanni, MD   ondansetron (ZOFRAN) injection 4 mg, 4 mg, Intravenous, Q6H PRN, John Giovanni, MD, 4 mg at 10/17/22 0813   ondansetron (ZOFRAN) injection 4 mg, 4 mg, Intravenous, Q6H PRN, Hodierne, Adam, MD   pantoprazole (PROTONIX) injection 40 mg, 40 mg, Intravenous, Q12H, John Giovanni, MD, 40 mg at 10/18/22 2121   umeclidinium-vilanterol (ANORO ELLIPTA) 62.5-25 MCG/ACT 1 puff, 1 puff, Inhalation, Daily, Luciano Cutter, MD, 1 puff at 10/19/22 0805    Family History  Problem Relation Age of Onset   Diabetes Mother    Hypertension Mother    Heart disease Mother        x 3    Heart disease Brother    Hypertension Brother    Diabetes Other    Hypertension Brother    Hypertension Brother    Cancer Neg Hx     Social History   Socioeconomic History   Marital status: Single    Spouse name: Not on file   Number of children: 1    Years of education: 12    Highest education level: Not on file  Occupational History   Occupation: Self-Employed    Comment: Landscaping and Financial trader   Tobacco Use   Smoking status: Every Day    Current packs/day: 1.50    Average packs/day: 1.5 packs/day for 31.0 years (46.5 ttl pk-yrs)    Types: Cigarettes   Smokeless tobacco: Current  Vaping Use   Vaping status: Every Day  Substance and Sexual Activity   Alcohol  use: Yes    Comment: quit 1980's    Drug use: Yes    Types: Marijuana    Comment: Patient reports using last night   Sexual activity: Yes    Birth control/protection: Condom  Other Topics Concern   Not on file  Social History Narrative   ** Merged History Encounter **       Lives with mother Delaney Partsch  Has 27 yo son.  Lives with his mother and grandmother.    Social Determinants of Health   Financial Resource Strain: Not on File (07/11/2021)   Received from [employer] [employer] Strain    Financial Resource Strain: 0  Food Insecurity: No Food Insecurity (10/16/2022)   Hunger Vital Sign    Worried About Running Out of Food in the Last Year: Never true    Ran Out of Food in the Last Year: Never true  Transportation Needs: No Transportation Needs (10/16/2022)   PRAPARE - Administrator, Civil Service (Medical): No    Lack of Transportation (Non-Medical): No  Physical Activity: Not on File (07/11/2021)   Received from [place], [place]   Physical Activity    Physical Activity: 0  Stress: Not on File (07/11/2021)   Received from [facility], [place]   Stress    Stress: 0  Social Connections: Not on File (07/11/2021)   Received from [place], [place]   Social Connections    Social Connections and Isolation: 0    Review of Systems: A 12 point ROS discussed and pertinent positives are indicated in the HPI above.  All other systems are negative.  Review of Systems  Vital Signs: BP 124/75 (BP Location: Left Arm)   Pulse 63   Temp 98 F (36.7 C) (Oral)   Resp 20   Ht 5\' 9"  (1.753 m)   Wt 118 lb 2.7 oz (53.6 kg)   SpO2 100%   BMI 17.45 kg/m   Physical Exam  Imaging: CT CHEST W CONTRAST  Result Date: 10/17/2022 CLINICAL DATA:  Staging hepatocellular carcinoma. * Tracking Code: BO * EXAM: CT CHEST WITH CONTRAST TECHNIQUE: Multidetector CT imaging of the chest was performed during intravenous contrast administration. RADIATION DOSE REDUCTION: This exam was performed according to the departmental dose-optimization program which includes automated exposure control, adjustment of the mA and/or kV according to patient size and/or use of iterative reconstruction technique. CONTRAST:  75mL OMNIPAQUE IOHEXOL 300 MG/ML  SOLN COMPARISON:  CT angiogram chest 09/04/2021. Abdomen and pelvis imaging 10/16/2018 FINDINGS: Cardiovascular: Thoracic aorta has a normal course and caliber with slight atherosclerotic plaque along the descending thoracic aorta. Coronary artery  calcifications are also seen. Please correlate for other coronary risk factors. The heart nonenlarged. Trace pericardial effusion. Mediastinum/Nodes: No specific abnormal lymph node enlargement seen in the axillary region, hilum. There is an enlarged lymph node precarinal on series 2 image 65 measuring 14 x 21 mm. No other abnormal lymph node enlargement clearly seen in the mediastinum. Lungs/Pleura: Paraseptal and centrilobular emphysematous lung changes are identified. No pneumothorax, effusion or consolidation. Areas of scarring and fibrotic changes. There is a nodule along the right upper lobe medially with some calcifications and pleural thickening measuring 3.0 x 1.9 cm on series 6, image 36. This has not seen on the prior examination. Numerous location previously there is a large bulla which is no longer identified. In principle, mass lesion is not excluded. Upper Abdomen: Again mass lesion seen in the liver. Please correlate for prior workup. Musculoskeletal: Scattered  degenerative changes are seen along the spine. IMPRESSION: 3 cm mass in the posterior right upper lobe with some dystrophic calcification. In addition there is an abnormal precarinal lymph node. Malignancy is possible. Recommend further workup such as PET-CT scan when clinically appropriate. 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 The previous larger bulla in the right lung apex is not seen on the current study. Coronary artery calcifications. Please correlate for other coronary risk factors. Aortic Atherosclerosis (ICD10-I70.0) and Emphysema (ICD10-J43.9). Electronically Signed   By: Karen Kays M.D.   On: 10/17/2022 15:45   MR LIVER W WO CONTRAST  Result Date: 10/17/2022 CLINICAL DATA:  57 year old male with history of multiple indeterminate liver lesions noted on prior CT examination. Follow-up study. EXAM: MRI ABDOMEN WITHOUT AND WITH CONTRAST TECHNIQUE: Multiplanar multisequence MR imaging of the abdomen was  performed both before and after the administration of intravenous contrast. CONTRAST:  6mL GADAVIST GADOBUTROL 1 MMOL/ML IV SOLN COMPARISON:  No prior abdominal MRI. CT of the abdomen and pelvis 10/16/2022. FINDINGS: Lower chest: Unremarkable. Hepatobiliary: There are 4 lesions in the liver which are low T1 signal intensity, high T2 signal intensity, demonstrates restricted diffusion, and demonstrate heterogeneous enhancement on post gadolinium imaging generally centrally hypovascular with peripheral enhancement, highly suspicious for metastatic disease. The largest of these is located in the right lobe of the liver predominantly centered in segment 8 (axial image 28 of series 10) estimated to measure approximately 5.0 x 4.1 cm, with additional lesions in segment 4A (axial image 20 of series 10), central aspect of segment 8 (axial image 21 of series 10), and inferior aspect of segment 6 (axial image 51 of series 10). A few other scattered tiny subcentimeter T1 hypointense, T2 hyperintense, nonenhancing lesions are noted in the liver, compatible with tiny cysts and/or biliary hamartomas. No intra or extrahepatic biliary ductal dilatation. Gallbladder is moderately distended. There is some dependent amorphous T1 hyperintense material which may represent some biliary sludge. Gallbladder wall does not appear thickened. No definite gallstones. Pancreas: No pancreatic mass. No pancreatic ductal dilatation. No pancreatic or peripancreatic fluid collections or inflammatory changes. Spleen:  Unremarkable. Adrenals/Urinary Tract: Bilateral kidneys and adrenal glands are normal in appearance. No hydroureteronephrosis in the visualized portions of the abdomen. Stomach/Bowel: Visualized portions are unremarkable. Vascular/Lymphatic: No aneurysm identified in the visualized abdominal vasculature. No definite lymphadenopathy noted in the abdomen. Other: No significant volume of ascites noted in the visualized portions of the  peritoneal cavity. Musculoskeletal: No aggressive appearing osseous lesions are noted in the visualized portions of the skeleton. IMPRESSION: 1. Four aggressive appearing hepatic lesions which have imaging characteristics most suggestive of metastatic disease to the liver. Further clinical evaluation to establish a source of primary malignancy is recommended. Electronically Signed   By: Trudie Reed M.D.   On: 10/17/2022 05:29   CT ABDOMEN PELVIS WO CONTRAST  Result Date: 10/16/2022 CLINICAL DATA:  Abdominal pain, acute, nonlocalized EXAM: CT ABDOMEN AND PELVIS WITHOUT CONTRAST TECHNIQUE: Multidetector CT imaging of the abdomen and pelvis was performed following the standard protocol without IV contrast. RADIATION DOSE REDUCTION: This exam was performed according to the departmental dose-optimization program which includes automated exposure control, adjustment of the mA and/or kV according to patient size and/or use of iterative reconstruction technique. COMPARISON:  09/04/2021 FINDINGS: Lower chest: No acute abnormality Hepatobiliary: Subtle low-density areas seen anteriorly on image 12, centrally on image 14, and in the right hepatic lobe anteriorly on image 18. These are difficult to characterize without IV contrast. Small  cyst in the hepatic dome measures 7 mm. Gallbladder unremarkable. No biliary ductal dilatation. Pancreas: No focal abnormality or ductal dilatation. Spleen: No focal abnormality.  Normal size. Adrenals/Urinary Tract: No adrenal abnormality. No focal renal abnormality. No stones or hydronephrosis. Urinary bladder is unremarkable. Stomach/Bowel: Normal appendix. Stomach, large and small bowel grossly unremarkable. Vascular/Lymphatic: Scattered aortoiliac atherosclerosis. No evidence of aneurysm or adenopathy. Reproductive: No visible focal abnormality. Other: No free fluid or free air. Musculoskeletal: No acute bony abnormality. IMPRESSION: Subtle ill-defined hypo dense areas within the  liver. Differential considerations would include focal fatty infiltration or masses. Recommend further evaluation with contrast-enhanced CT or right upper quadrant ultrasound. Aortoiliac atherosclerosis. Electronically Signed   By: Charlett Nose M.D.   On: 10/16/2022 19:23   US Abdomen Limited  Result Date: 10/16/2022 CLINICAL DATA:  RUQ pain EXAM: ULTRASOUND ABDOMEN LIMITED RIGHT UPPER QUADRANT COMPARISON:  CT scan abdomen and pelvis from 09/04/2021. FINDINGS: Gallbladder: Physiologically distended. There is a sub 4 mm sessile gallbladder polyp. No gallstones or abnormal wall thickening. No pericholecystic free fluid. No sonographic Murphy sign noted by sonographer. Common bile duct: Diameter: Less than 4 mm. Liver: No focal lesion identified. Within normal limits in parenchymal echogenicity. Liver echotexture is within normal limits. However, there is heterogeneous echogenicity with ill-defined interspersed Iso and hyperechoic areas within the technologist measured at least. 2 ill-defined hyperechoic lesions measured 4.2 x 5.0 x 5.2 cm and 1.9 x 1.9 x 2.6 cm. These are incompletely imaged and characterized on the current examination. Further evaluation with multiphasic contrast-enhanced MRI abdomen as per liver mass protocol is recommended. Portal vein is patent on color Doppler imaging with normal direction of blood flow towards the liver. Other: None. IMPRESSION: 1. No evidence of cholecystitis. 2. Heterogeneous liver with ill-defined hyperechoic lesions. Further evaluation with multiphasic contrast-enhanced MRI abdomen as per liver mass protocol is recommended. 3. There is a single sub 4 mm, low risk gallbladder polyp. Please see follow-up recommendations below. Management of Incidentally Detected Gallbladder Polyps: Society of Radiologists in Ultrasound Consensus Conference Recommendations (published online September 25, 2020): [email or web address] Extremely low-risk polyps, i.e. pedunculated  ball-on-the-wall or thin stalk: <9 mm: no follow-up 10-14 mm: follow-up at 6, 12 and 24 months >15 mm: surgical consult Low-risk polyps, i.e. pedunculated with a thick or wide stalk, or sessile: ?6 mm: no follow-up 7-9 mm follow-up ultrasound at 12 months 10-14 mm: follow-up ultrasound at 6, 12, 24, and 36 months vs surgical consult >15 mm: surgical consult Intermediate risk: focal wall-thickening >22mm adjacent to polyp: <6 mm: follow-up at 6, 12, 24, 36 months vs surgical consult >7 mm: surgical consult Electronically Signed   By: Jules Schick M.D.   On: 10/16/2022 15:32    Labs:  CBC: Recent Labs    10/16/22 1338 10/17/22 0509 10/18/22 1230 10/18/22 1809  WBC 6.1 7.6 7.1  --   HGB 8.9* 8.9* 7.0* 7.4*  HCT 29.8* 30.5* 24.0* 24.6*  PLT 434* 328 273  --     COAGS: No results for input(s): "INR", "APTT" in the last 8760 hours.  BMP: Recent Labs    10/16/22 1338 10/17/22 0509 10/18/22 1230  NA 133* 132* 133*  K 3.8 4.3 3.8  CL 101 103 104  CO2 22 19* 22  GLUCOSE 92 77 79  BUN 36* 29* 16  CALCIUM 9.0 8.6* 8.0*  CREATININE 1.47* 1.01 1.14  GFRNONAA 55* >60 >60    LIVER FUNCTION TESTS: Recent Labs    10/16/22 1338  BILITOT 0.8  AST  17  ALT 13  ALKPHOS 97  PROT 8.1  ALBUMIN 4.1     Assessment and Plan: Liver lesions in setting of lung nodule, colonic mass and duodenal ulcer.  Imaging and case reviewed with Dr. Loreta Ave Pathology currently pending on duodenal and colonic biopsies. Unknown primary at this point. CEA also in process. If both GI biopsies negative, can proceed with liver lesion bx. If one bx is positive, recommend outpt PET scan for further workup. If both bx positive, but different, can proceed with liver bx for eval of mets/staging.  IR will cont to follow    Electronically Signed: Brayton El, PA-C 10/19/2022, 8:24 AM   I spent a total of 20 minutes in face to face in clinical consultation, greater than 50% of which was  counseling/coordinating care for liver bx

## 2022-10-19 NOTE — Progress Notes (Signed)
PROGRESS NOTE    Marvin Thomas  ZOX:[id number] DOB: [birth year] DOA: 10/16/2022 PCP: Pcp, No   Brief Narrative: 57 year old past medical history significant for COPD, Chronic anemia, gallstone, hemothorax, GERD, PUD/perforated pyloric ulcer status postrepair 2019, substance abuse (marijuana tobacco) presents complaining of right upper quadrant abdominal pain worse after eating or drinking.  He was found to have hemoglobin of 8.9 previously 10 and 12, AKI with a creatinine of 1.4, normal lipase and liver function test.  Right upper quadrant ultrasound showed hyperechoic lesion and a single sip 4 mm low risk gallbladder polyp.  Patient also reports melena.  MRI of the liver showed 4 aggressive appearing hepatic lesion which have imaging characteristics most suggestive of metastatic disease to the liver.  Further clinical evaluation to establish as source of primary malignancies recommended.   Assessment & Plan:   Principal Problem:   Abdominal pain Active Problems:   COPD (chronic obstructive pulmonary disease) (HCC)   Acute on chronic anemia   GI bleed   AKI (acute kidney injury) (HCC)   Bradycardia  1-Epigastric pain Acute on chronic blood loss anemia, Iron deficiency anemia;  Melena, GI bleed: -Continue with IV Protonix.  -Underwent Endoscopy, Colonoscopy : Found to have Large duodenal ulcer, non bleeding, multiple small gastric ulcers. Large Polypoid mass like transverse or proximal colon. Awaiting Biopsy.  Hb down to 7.0. Received one unit PRBC> 7/27. Hb still at 7.5. will give IV iron.   2-AKI: -In setting of poor oral intake.  -Presented with Cr 1.4 Trending down.  -Treated  with IV fluids.  -Resolved.   3-Multiple liver lesions,  Lung Mass,  Large Polypoid Mass like transverse or proximal Colon:  -CT chest: 3 cm mass in the posterior right upper lobe with some dystrophic calcification. In addition there is an abnormal precarinal lymph node. Emphysematous lung changes  with bulla and bleb formation.  Underwent Endoscopy, Colonoscopy : Found to have Large duodenal ulcer, non bleeding, multiple small gastric ulcers. Large Polypoid mass like transverse or proximal colon. Awaiting Biopsy. -Pulmonology consulted. Recommend liver bx.  -Discussed with Dr Arbutus Ped, he recommend Liver biopsy.  -IR consulted for liver Biopsy. IR would like to wait for colon Bx  4-Bradycardia -Asymptomatic.  -Mg pending.  -TSH: 1.018  Hypertension: Started  Norvasc.   Mild hyponatremia: -Continue with IV fluids.   Abnormal urinalysis pyuria: -Follow Urine Culture. Multiples species.   COPD: -PRN nebulizer.     Estimated body mass index is 17.45 kg/m as calculated from the following:   Height as of this encounter: 5\' 9"  (1.753 m).   Weight as of this encounter: 53.6 kg.   DVT prophylaxis: SCD Code Status: Full code Family Communication: Care discussed with patient.  Disposition Plan:  Status is: Observation The patient will require care spanning > 2 midnights and should be moved to inpatient because: management of anemia. AKI    Consultants:  GI  Procedures:    Antimicrobials:    Subjective: He is alert, denies having BM. Denies abdominal pain.   Objective: Vitals:   10/18/22 2122 10/19/22 0447 10/19/22 0807 10/19/22 1303  BP: 116/63 124/75  133/70  Pulse: 75 63  66  Resp: 18 20  18   Temp: 99.6 F (37.6 C) 98 F (36.7 C)  98.5 F (36.9 C)  TempSrc: Oral Oral  Oral  SpO2: 99% 100% 100% 99%  Weight:      Height:        Intake/Output Summary (Last 24 hours) at 10/19/2022 1426 Last  data filed at 10/19/2022 0700 Gross per 24 hour  Intake 1396.67 ml  Output 700 ml  Net 696.67 ml   Filed Weights   10/16/22 2122 10/18/22 0712  Weight: 53.6 kg 53.6 kg    Examination:  General exam: NAD Respiratory system: CTA Cardiovascular system: S 1, S 2 RRR Gastrointestinal system: BS present, soft, nt Central nervous system: Alert, oriented.   Extremities: Symmetric 5 x 5 power.   Data Reviewed: I have personally reviewed following labs and imaging studies  CBC: Recent Labs  Lab 10/16/22 1338 10/17/22 0509 10/18/22 1230 10/18/22 1809 10/19/22 1248  WBC 6.1 7.6 7.1  --  5.6  NEUTROABS 4.6  --   --   --   --   HGB 8.9* 8.9* 7.0* 7.4* 7.5*  HCT 29.8* 30.5* 24.0* 24.6* 24.6*  MCV 70.0* 72.3* 71.6*  --  71.5*  PLT 434* 328 273  --  270   Basic Metabolic Panel: Recent Labs  Lab 10/16/22 1338 10/17/22 0509 10/18/22 1230 10/19/22 1248  NA 133* 132* 133* 134*  K 3.8 4.3 3.8 3.5  CL 101 103 104 104  CO2 22 19* 22 22  GLUCOSE 92 77 79 91  BUN 36* 29* 16 17  CREATININE 1.47* 1.01 1.14 0.82  CALCIUM 9.0 8.6* 8.0* 8.3*  MG  --   --  1.6* 1.8   GFR: Estimated Creatinine Clearance: 75.4 mL/min (by C-G formula based on SCr of 0.82 mg/dL). Liver Function Tests: Recent Labs  Lab 10/16/22 1338  AST 17  ALT 13  ALKPHOS 97  BILITOT 0.8  PROT 8.1  ALBUMIN 4.1   Recent Labs  Lab 10/16/22 1338  LIPASE 30   No results for input(s): "AMMONIA" in the last 168 hours. Coagulation Profile: No results for input(s): "INR", "PROTIME" in the last 168 hours. Cardiac Enzymes: No results for input(s): "CKTOTAL", "CKMB", "CKMBINDEX", "TROPONINI" in the last 168 hours. BNP (last 3 results) No results for input(s): "PROBNP" in the last 8760 hours. HbA1C: No results for input(s): "HGBA1C" in the last 72 hours. CBG: No results for input(s): "GLUCAP" in the last 168 hours. Lipid Profile: No results for input(s): "CHOL", "HDL", "LDLCALC", "TRIG", "CHOLHDL", "LDLDIRECT" in the last 72 hours. Thyroid Function Tests: Recent Labs    10/17/22 1541  TSH 1.018   Anemia Panel: Recent Labs    10/17/22 1541  VITAMINB12 346  FOLATE 11.2  FERRITIN 7*  TIBC 336  IRON 23*  RETICCTPCT 1.7   Sepsis Labs: No results for input(s): "PROCALCITON", "LATICACIDVEN" in the last 168 hours.  Recent Results (from the past 240 hour(s))   Urine Culture (for pregnant, neutropenic or urologic patients or patients with an indwelling urinary catheter)     Status: Abnormal   Collection Time: 10/16/22  8:43 PM   Specimen: Urine, Clean Catch  Result Value Ref Range Status   Specimen Description   Final    URINE, CLEAN CATCH Performed at [facility], [address], [place], [place] [address]    Special Requests   Final    NONE Performed at [facility], [address] [place], [place] [address]    Culture MULTIPLE SPECIES PRESENT, SUGGEST RECOLLECTION (A)  Final   Report Status 10/18/2022 FINAL  Final         Radiology Studies: No results found.      Scheduled Meds:  amLODipine  5 mg Oral Daily   feeding supplement  1 Container Oral TID BM  pantoprazole (PROTONIX) IV  40 mg Intravenous Q12H   umeclidinium-vilanterol  1 puff Inhalation Daily   Continuous Infusions:     LOS: 2 days    Time spent: 35 minutes.     Alba Cory, MD [facility]   If 7PM-7AM, please contact night-coverage [email or web address]  10/19/2022, 2:26 PM

## 2022-10-20 LAB — TYPE AND SCREEN
ABO/RH(D): A POS
Antibody Screen: NEGATIVE
Unit division: 0
Unit division: 0

## 2022-10-20 LAB — BPAM RBC
Blood Product Expiration Date: 202408232359
Blood Product Expiration Date: 202408232359
ISSUE DATE / TIME: 202407291102
ISSUE DATE / TIME: 202407291658
Unit Type and Rh: 6200
Unit Type and Rh: 6200

## 2022-10-20 LAB — CEA: CEA: 3 ng/mL (ref 0.0–4.7)

## 2022-10-20 LAB — HEMOGLOBIN AND HEMATOCRIT, BLOOD
HCT: 27.6 % — ABNORMAL LOW (ref 39.0–52.0)
Hemoglobin: 8.5 g/dL — ABNORMAL LOW (ref 13.0–17.0)

## 2022-10-20 LAB — PREPARE RBC (CROSSMATCH)

## 2022-10-20 MED ORDER — POTASSIUM CHLORIDE CRYS ER 20 MEQ PO TBCR
40.0000 meq | EXTENDED_RELEASE_TABLET | Freq: Once | ORAL | Status: AC
  Administered 2022-10-20: 40 meq via ORAL
  Filled 2022-10-20: qty 2

## 2022-10-20 MED ORDER — SODIUM CHLORIDE 0.9% IV SOLUTION: Freq: Once | INTRAVENOUS | Status: AC

## 2022-10-20 NOTE — Progress Notes (Signed)
PROGRESS NOTE    Marvin Thomas  GNF:[id number] DOB: [birth year] DOA: 10/16/2022 PCP: Pcp, No   Brief Narrative: 57 year old past medical history significant for COPD, Chronic anemia, gallstone, hemothorax, GERD, PUD/perforated pyloric ulcer status postrepair 2019, substance abuse (marijuana tobacco) presents complaining of right upper quadrant abdominal pain worse after eating or drinking.  He was found to have hemoglobin of 8.9 previously 10 and 12, AKI with a creatinine of 1.4, normal lipase and liver function test.  Right upper quadrant ultrasound showed hyperechoic lesion and a single sip 4 mm low risk gallbladder polyp.  Patient also reports melena.  MRI of the liver showed 4 aggressive appearing hepatic lesion which have imaging characteristics most suggestive of metastatic disease to the liver.  Further clinical evaluation to establish as source of primary malignancies recommended.   Assessment & Plan:   Principal Problem:   Abdominal pain Active Problems:   COPD (chronic obstructive pulmonary disease) (HCC)   Acute on chronic anemia   GI bleed   AKI (acute kidney injury) (HCC)   Bradycardia  1-Epigastric pain Acute on chronic blood loss anemia, Iron deficiency anemia;  Melena, GI bleed: -Continue with IV Protonix.  -Underwent Endoscopy, Colonoscopy : Found to have Large duodenal ulcer, non bleeding, multiple small gastric ulcers. Large Polypoid mass like transverse or proximal colon. Awaiting Biopsy.  Hb down to 7.0. Received one unit PRBC> 7/27. Received IV Iron.  Hb down to 6,9.  Plan to proceed with 2 units  PRBC transfusion.  GI will follow up on patient today   2-AKI: -In setting of poor oral intake.  -Presented with Cr 1.4 Trending down.  -Treated  with IV fluids.  -Resolved.   3-Multiple liver lesions,  Lung Mass,  Large Polypoid Mass like transverse or proximal Colon:  -CT chest: 3 cm mass in the posterior right upper lobe with some  dystrophic calcification. In addition there is an abnormal precarinal lymph node. 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  Underwent Endoscopy, Colonoscopy : Found to have Large duodenal ulcer, non bleeding, multiple small gastric ulcers. Large Polypoid mass like transverse or proximal colon. Awaiting Biopsy. -Pulmonology consulted. Recommend liver bx.  -Discussed with Dr Arbutus Ped, he recommend Liver biopsy.  -IR consulted for liver Biopsy. IR would like to wait for colon Bx results.   4-Bradycardia -Asymptomatic.  -Mg 1.8 -TSH: 1.018  Hypertension: Started  Norvasc.   Mild hyponatremia: -Continue with IV fluids.   Abnormal urinalysis pyuria: -Follow Urine Culture. Multiples species.   COPD: -PRN nebulizer.     Estimated body mass index is 17.45 kg/m as calculated from the following:   Height as of this encounter: 5\' 9"  (1.753 m).   Weight as of this encounter: 53.6 kg.   DVT prophylaxis: SCD Code Status: Full code Family Communication: Care discussed with patient.  Disposition Plan:  Status is: Observation The patient will require care spanning > 2 midnights and should be moved to inpatient because: management of anemia. AKI    Consultants:  GI  Procedures:    Antimicrobials:    Subjective: He is alert, report black stool last night.  Denies abdominal pain.   Objective: Vitals:   10/20/22 0550 10/20/22 1100 10/20/22 1115 10/20/22 1429  BP: 117/69 124/61 121/64 135/70  Pulse: 68 65 68 63  Resp: 19 16 16 18   Temp: 98.6 F (37 C) 98.7 F (37.1 C) 98.7 F (37.1 C) 98.4 F (36.9 C)  TempSrc: Oral Oral Oral Oral  SpO2: 100% 100% 100%  100%  Weight:      Height:        Intake/Output Summary (Last 24 hours) at 10/20/2022 1436 Last data filed at 10/20/2022 1420 Gross per 24 hour  Intake 1748 ml  Output 300 ml  Net 1448 ml   Filed Weights   10/16/22 2122 10/18/22 0712  Weight: 53.6 kg 53.6 kg    Examination:  General exam:  NAD Respiratory system: CTA Cardiovascular system: S 1, S 2 RRR Gastrointestinal system: BS present, soft, nt Central nervous system: alert Extremities: no edema   Data Reviewed: I have personally reviewed following labs and imaging studies  CBC: Recent Labs  Lab 10/16/22 1338 10/17/22 0509 10/18/22 1230 10/18/22 1809 10/19/22 1248 10/20/22 0539  WBC 6.1 7.6 7.1  --  5.6 6.3  NEUTROABS 4.6  --   --   --   --   --   HGB 8.9* 8.9* 7.0* 7.4* 7.5* 6.9*  HCT 29.8* 30.5* 24.0* 24.6* 24.6* 22.8*  MCV 70.0* 72.3* 71.6*  --  71.5* 71.7*  PLT 434* 328 273  --  270 255   Basic Metabolic Panel: Recent Labs  Lab 10/16/22 1338 10/17/22 0509 10/18/22 1230 10/19/22 1248 10/20/22 0539  NA 133* 132* 133* 134* 136  K 3.8 4.3 3.8 3.5 3.3*  CL 101 103 104 104 107  CO2 22 19* 22 22 22   GLUCOSE 92 77 79 91 101*  BUN 36* 29* 16 17 19   CREATININE 1.47* 1.01 1.14 0.82 0.91  CALCIUM 9.0 8.6* 8.0* 8.3* 8.4*  MG  --   --  1.6* 1.8 1.8   GFR: Estimated Creatinine Clearance: 67.9 mL/min (by C-G formula based on SCr of 0.91 mg/dL). Liver Function Tests: Recent Labs  Lab 10/16/22 1338 10/20/22 0539  AST 17 14*  ALT 13 9  ALKPHOS 97 64  BILITOT 0.8 0.5  PROT 8.1 5.8*  ALBUMIN 4.1 2.8*   Recent Labs  Lab 10/16/22 1338  LIPASE 30   No results for input(s): "AMMONIA" in the last 168 hours. Coagulation Profile: Recent Labs  Lab 10/20/22 0539  INR 1.2   Cardiac Enzymes: No results for input(s): "CKTOTAL", "CKMB", "CKMBINDEX", "TROPONINI" in the last 168 hours. BNP (last 3 results) No results for input(s): "PROBNP" in the last 8760 hours. HbA1C: No results for input(s): "HGBA1C" in the last 72 hours. CBG: No results for input(s): "GLUCAP" in the last 168 hours. Lipid Profile: No results for input(s): "CHOL", "HDL", "LDLCALC", "TRIG", "CHOLHDL", "LDLDIRECT" in the last 72 hours. Thyroid Function Tests: Recent Labs    10/17/22 1541  TSH 1.018   Anemia Panel: Recent Labs     10/17/22 1541  VITAMINB12 346  FOLATE 11.2  FERRITIN 7*  TIBC 336  IRON 23*  RETICCTPCT 1.7   Sepsis Labs: No results for input(s): "PROCALCITON", "LATICACIDVEN" in the last 168 hours.  Recent Results (from the past 240 hour(s))  Urine Culture (for pregnant, neutropenic or urologic patients or patients with an indwelling urinary catheter)     Status: Abnormal   Collection Time: 10/16/22  8:43 PM   Specimen: Urine, Clean Catch  Result Value Ref Range Status   Specimen Description   Final    URINE, CLEAN CATCH Performed at [facility], [address] [place], [place] [address]    Special Requests   Final    NONE Performed at [facility], [address] [place], [place] [address]    Culture MULTIPLE SPECIES PRESENT, SUGGEST RECOLLECTION (A)  Final   Report Status 10/18/2022 FINAL  Final         Radiology Studies: No results found.      Scheduled Meds:  vitamin B-12  1,000 mcg Oral Daily   feeding supplement  1 Container Oral TID BM   folic acid  1 mg Oral Daily   pantoprazole (PROTONIX) IV  40 mg Intravenous Q12H   umeclidinium-vilanterol  1 puff Inhalation Daily   Continuous Infusions:     LOS: 3 days    Time spent: 35 minutes.     Alba Cory, MD [facility]   If 7PM-7AM, please contact night-coverage [email or web address]  10/20/2022, 2:36 PM

## 2022-10-20 NOTE — Anesthesia Postprocedure Evaluation (Signed)
Anesthesia Post Note  Patient: Marvin Thomas  Procedure(s) Performed: ESOPHAGOGASTRODUODENOSCOPY (EGD) WITH PROPOFOL COLONOSCOPY WITH PROPOFOL BIOPSY SUBMUCOSAL TATTOO INJECTION     Patient location during evaluation: Endoscopy Anesthesia Type: MAC Level of consciousness: awake and alert Pain management: pain level controlled Vital Signs Assessment: post-procedure vital signs reviewed and stable Respiratory status: spontaneous breathing, nonlabored ventilation, respiratory function stable and patient connected to nasal cannula oxygen Cardiovascular status: stable and blood pressure returned to baseline Postop Assessment: no apparent nausea or vomiting Anesthetic complications: no   No notable events documented.  Last Vitals:  Vitals:   10/19/22 1924 10/20/22 0550  BP: 123/73 117/69  Pulse: 61 68  Resp: 20 19  Temp: 36.9 C 37 C  SpO2: 100% 100%    Last Pain:  Vitals:   10/20/22 0550  TempSrc: Oral  PainSc:                  , S

## 2022-10-20 NOTE — Progress Notes (Signed)
Subjective: Patient reports having black sticky tarry stools last night.  He denies abdominal pain.  Objective: Vital signs in last 24 hours: Temp:  [98.4 F (36.9 C)-98.6 F (37 C)] 98.6 F (37 C) (07/29 0550) Pulse Rate:  [61-68] 68 (07/29 0550) Resp:  [18-20] 19 (07/29 0550) BP: (117-133)/(69-73) 117/69 (07/29 0550) SpO2:  [99 %-100 %] 100 % (07/29 0550) Weight change:  Last BM Date : 10/17/22  PE: Appears comfortable, able to speak in full sentences GENERAL: Mild pallor  ABDOMEN: Well healed upper midline scar, nontender, nondistended, normoactive bowel sounds EXTREMITIES: No deformity  Lab Results: Results for orders placed or performed during the hospital encounter of 10/16/22 (from the past 48 hour(s))  CBC     Status: Abnormal   Collection Time: 10/18/22 12:30 PM  Result Value Ref Range   WBC 7.1 4.0 - 10.5 K/uL   RBC 3.35 (L) 4.22 - 5.81 MIL/uL   Hemoglobin 7.0 (L) 13.0 - 17.0 g/dL    Comment: Reticulocyte Hemoglobin testing may be clinically indicated, consider ordering this additional test [id number]    HCT 24.0 (L) 39.0 - 52.0 %   MCV 71.6 (L) 80.0 - 100.0 fL   MCH 20.9 (L) 26.0 - 34.0 pg   MCHC 29.2 (L) 30.0 - 36.0 g/dL   RDW 81.1 (H) 91.4 - 78.2 %   Platelets 273 150 - 400 K/uL   nRBC 0.0 0.0 - 0.2 %    Comment: Performed at [facility], [address], [place], [place] [address]  Basic metabolic panel     Status: Abnormal   Collection Time: 10/18/22 12:30 PM  Result Value Ref Range   Sodium 133 (L) 135 - 145 mmol/L   Potassium 3.8 3.5 - 5.1 mmol/L   Chloride 104 98 - 111 mmol/L   CO2 22 22 - 32 mmol/L   Glucose, Bld 79 70 - 99 mg/dL    Comment: Glucose reference range applies only to samples taken after fasting for at least 8 hours.   BUN 16 6 - 20 mg/dL   Creatinine, Ser 3.08 0.61 - 1.24 mg/dL   Calcium 8.0 (L) 8.9 - 10.3 mg/dL   GFR, Estimated >65 >78 mL/min    Comment: (NOTE) Calculated using the CKD-EPI Creatinine Equation  (2021)    Anion gap 7 5 - 15    Comment: Performed at [facility], [address] [place], [place] [address]  Magnesium     Status: Abnormal   Collection Time: 10/18/22 12:30 PM  Result Value Ref Range   Magnesium 1.6 (L) 1.7 - 2.4 mg/dL    Comment: Performed at [facility], [address], [place], [place] [address]  Prepare RBC (crossmatch)     Status: None   Collection Time: 10/18/22  1:01 PM  Result Value Ref Range   Order Confirmation      ORDER PROCESSED BY BLOOD BANK Performed at [facility], [address], [place], [place] [address]   Hemoglobin and hematocrit, blood     Status: Abnormal   Collection Time: 10/18/22  6:09 PM  Result Value Ref Range   Hemoglobin 7.4 (L) 13.0 - 17.0 g/dL   HCT 01.0 (L) 27.2 - 53.6 %    Comment: Performed at [facility], [address], [place], [place] [address]  CBC     Status: Abnormal   Collection Time: 10/19/22 12:48 PM  Result Value Ref Range   WBC 5.6 4.0 -  10.5 K/uL   RBC 3.44 (L) 4.22 - 5.81 MIL/uL   Hemoglobin 7.5 (L) 13.0 - 17.0 g/dL    Comment: Reticulocyte Hemoglobin testing may be clinically indicated, consider ordering this additional test [id number]    HCT 24.6 (L) 39.0 - 52.0 %   MCV 71.5 (L) 80.0 - 100.0 fL   MCH 21.8 (L) 26.0 - 34.0 pg   MCHC 30.5 30.0 - 36.0 g/dL   RDW 86.5 (H) 78.4 - 69.6 %   Platelets 270 150 - 400 K/uL   nRBC 0.0 0.0 - 0.2 %    Comment: Performed at [facility], [address], [place], [place] [address]  Basic metabolic panel     Status: Abnormal   Collection Time: 10/19/22 12:48 PM  Result Value Ref Range   Sodium 134 (L) 135 - 145 mmol/L   Potassium 3.5 3.5 - 5.1 mmol/L   Chloride 104 98 - 111 mmol/L   CO2 22 22 - 32 mmol/L   Glucose, Bld 91 70 - 99 mg/dL    Comment: Glucose reference range applies only to samples taken after fasting for at least 8 hours.   BUN 17 6 - 20 mg/dL    Creatinine, Ser 4.13 0.61 - 1.24 mg/dL   Calcium 8.3 (L) 8.9 - 10.3 mg/dL   GFR, Estimated >24 >40 mL/min    Comment: (NOTE) Calculated using the CKD-EPI Creatinine Equation (2021)    Anion gap 8 5 - 15    Comment: Performed at [facility], [address], [place], [place] [address]  Magnesium     Status: None   Collection Time: 10/19/22 12:48 PM  Result Value Ref Range   Magnesium 1.8 1.7 - 2.4 mg/dL    Comment: Performed at [facility], [address], [place], [place] [address]  Protime-INR     Status: None   Collection Time: 10/20/22  5:39 AM  Result Value Ref Range   Prothrombin Time 15.2 11.4 - 15.2 seconds   INR 1.2 0.8 - 1.2    Comment: (NOTE) INR goal varies based on device and disease states. Performed at [facility], [address] [place], [place] [address]   CBC     Status: Abnormal   Collection Time: 10/20/22  5:39 AM  Result Value Ref Range   WBC 6.3 4.0 - 10.5 K/uL   RBC 3.18 (L) 4.22 - 5.81 MIL/uL   Hemoglobin 6.9 (LL) 13.0 - 17.0 g/dL    Comment: Reticulocyte Hemoglobin testing may be clinically indicated, consider ordering this additional test [id number] THIS CRITICAL RESULT HAS VERIFIED AND BEEN CALLED TO Delorise Jackson RN BY XIONG,KONG ON 07 29 2024 AT 0654, AND HAS BEEN READ BACK.     HCT 22.8 (L) 39.0 - 52.0 %   MCV 71.7 (L) 80.0 - 100.0 fL   MCH 21.7 (L) 26.0 - 34.0 pg   MCHC 30.3 30.0 - 36.0 g/dL   RDW 75.6 (H) 43.3 - 29.5 %   Platelets 255 150 - 400 K/uL   nRBC 0.0 0.0 - 0.2 %    Comment: Performed at [facility], [address] [place], [place] [address]  Comprehensive metabolic panel     Status: Abnormal   Collection Time: 10/20/22  5:39 AM  Result Value Ref Range   Sodium 136 135 - 145 mmol/L   Potassium 3.3 (L) 3.5 - 5.1 mmol/L   Chloride 107 98 - 111 mmol/L   CO2 22  22 - 32 mmol/L   Glucose, Bld 101 (H) 70 - 99 mg/dL    Comment: Glucose reference range  applies only to samples taken after fasting for at least 8 hours.   BUN 19 6 - 20 mg/dL   Creatinine, Ser 0.98 0.61 - 1.24 mg/dL   Calcium 8.4 (L) 8.9 - 10.3 mg/dL   Total Protein 5.8 (L) 6.5 - 8.1 g/dL   Albumin 2.8 (L) 3.5 - 5.0 g/dL   AST 14 (L) 15 - 41 U/L   ALT 9 0 - 44 U/L   Alkaline Phosphatase 64 38 - 126 U/L   Total Bilirubin 0.5 0.3 - 1.2 mg/dL   GFR, Estimated >11 >91 mL/min    Comment: (NOTE) Calculated using the CKD-EPI Creatinine Equation (2021)    Anion gap 7 5 - 15    Comment: Performed at [facility], [address], [place], [place] [address]  Magnesium     Status: None   Collection Time: 10/20/22  5:39 AM  Result Value Ref Range   Magnesium 1.8 1.7 - 2.4 mg/dL    Comment: Performed at [facility], [address], [place], [place] [address]    Studies/Results: No results found.  Medications: I have reviewed the patient's current medications.  Assessment: 5 mm gastric ulcer 15 mm cratered duodenal ulcer Mass in transverse colon, biopsied, results pending, tattooed, poor prep  Drop in hemoglobin with normal BUN Hemoglobin 7.5 yesterday and 6.9 today, microcytosis, MCV 71.7 3 cm mass in right upper lobe on CT chest For aggressive hepatic lesions characteristic of metastatic liver disease on MRI of liver   Plan: As per primary team, 2 unit PRBC transfusion has been ordered. Patient is scheduled for liver biopsy today. Continue pantoprazole 40 mg twice a day. Appears hemodynamically stable. Will follow results of biopsy from EGD and colonoscopy and liver biopsy.   Kerin Salen, MD 10/20/2022, 10:24 AM

## 2022-10-21 ENCOUNTER — Encounter (HOSPITAL_COMMUNITY): Payer: Self-pay | Admitting: Gastroenterology

## 2022-10-21 MED ORDER — ALBUTEROL SULFATE (2.5 MG/3ML) 0.083% IN NEBU
3.0000 mL | INHALATION_SOLUTION | Freq: Every day | RESPIRATORY_TRACT | Status: DC
  Administered 2022-10-22: 3 mL via RESPIRATORY_TRACT
  Filled 2022-10-21: qty 3

## 2022-10-21 MED ORDER — ADULT MULTIVITAMIN W/MINERALS CH
1.0000 | ORAL_TABLET | Freq: Every day | ORAL | Status: DC
  Administered 2022-10-21 – 2022-10-24 (×4): 1 via ORAL
  Filled 2022-10-21 (×4): qty 1

## 2022-10-21 MED ORDER — ENSURE ENLIVE PO LIQD
237.0000 mL | Freq: Three times a day (TID) | ORAL | Status: DC
  Administered 2022-10-21 – 2022-10-23 (×4): 237 mL via ORAL

## 2022-10-21 MED ORDER — IPRATROPIUM-ALBUTEROL 0.5-2.5 (3) MG/3ML IN SOLN: 3.0000 mL | Freq: Four times a day (QID) | RESPIRATORY_TRACT | Status: DC

## 2022-10-21 MED ORDER — ALBUTEROL SULFATE (2.5 MG/3ML) 0.083% IN NEBU
3.0000 mL | INHALATION_SOLUTION | Freq: Four times a day (QID) | RESPIRATORY_TRACT | Status: DC
  Administered 2022-10-21: 3 mL via RESPIRATORY_TRACT
  Filled 2022-10-21: qty 3

## 2022-10-21 NOTE — Progress Notes (Signed)
Subjective: Patient reports he received 2 units of blood transfusion yesterday.  He has had a bowel movement which is no longer black, stools are brown now.  Denies nausea, vomiting or abdominal pain.  Objective: Vital signs in last 24 hours: Temp:  [97.3 F (36.3 C)-98.8 F (37.1 C)] 98.3 F (36.8 C) (07/30 0442) Pulse Rate:  [63-72] 63 (07/30 0442) Resp:  [16-18] 18 (07/30 0442) BP: (121-138)/(55-70) 121/69 (07/30 0442) SpO2:  [98 %-100 %] 98 % (07/30 0926) Weight change:  Last BM Date : 10/19/22  PE: He was at the nurses station and was able to easily walk to his room GENERAL: Not in distress, mild pallor  ABDOMEN: Nondistended abdomen, nontender, normoactive bowel sounds EXTREMITIES: No deformity  Lab Results: Results for orders placed or performed during the hospital encounter of 10/16/22 (from the past 48 hour(s))  CBC     Status: Abnormal   Collection Time: 10/19/22 12:48 PM  Result Value Ref Range   WBC 5.6 4.0 - 10.5 K/uL   RBC 3.44 (L) 4.22 - 5.81 MIL/uL   Hemoglobin 7.5 (L) 13.0 - 17.0 g/dL    Comment: Reticulocyte Hemoglobin testing may be clinically indicated, consider ordering this additional test [id number]    HCT 24.6 (L) 39.0 - 52.0 %   MCV 71.5 (L) 80.0 - 100.0 fL   MCH 21.8 (L) 26.0 - 34.0 pg   MCHC 30.5 30.0 - 36.0 g/dL   RDW 28.4 (H) 13.2 - 44.0 %   Platelets 270 150 - 400 K/uL   nRBC 0.0 0.0 - 0.2 %    Comment: Performed at [facility], [address], [place], [place] [address]  Basic metabolic panel     Status: Abnormal   Collection Time: 10/19/22 12:48 PM  Result Value Ref Range   Sodium 134 (L) 135 - 145 mmol/L   Potassium 3.5 3.5 - 5.1 mmol/L   Chloride 104 98 - 111 mmol/L   CO2 22 22 - 32 mmol/L   Glucose, Bld 91 70 - 99 mg/dL    Comment: Glucose reference range applies only to samples taken after fasting for at least 8 hours.   BUN 17 6 - 20 mg/dL   Creatinine, Ser 5.36 0.61 - 1.24 mg/dL   Calcium 8.3 (L) 8.9 - 10.3  mg/dL   GFR, Estimated >64 >40 mL/min    Comment: (NOTE) Calculated using the CKD-EPI Creatinine Equation (2021)    Anion gap 8 5 - 15    Comment: Performed at [facility], [address], [place], [place] [address]  Magnesium     Status: None   Collection Time: 10/19/22 12:48 PM  Result Value Ref Range   Magnesium 1.8 1.7 - 2.4 mg/dL    Comment: Performed at [facility], [address], [place], [place] [address]  Protime-INR     Status: None   Collection Time: 10/20/22  5:39 AM  Result Value Ref Range   Prothrombin Time 15.2 11.4 - 15.2 seconds   INR 1.2 0.8 - 1.2    Comment: (NOTE) INR goal varies based on device and disease states. Performed at [facility], [address], [place], [place] [address]   CBC     Status: Abnormal   Collection Time: 10/20/22  5:39 AM  Result Value Ref Range   WBC 6.3 4.0 - 10.5 K/uL   RBC 3.18 (L) 4.22 - 5.81 MIL/uL   Hemoglobin 6.9 (LL) 13.0 - 17.0 g/dL  Comment: Reticulocyte Hemoglobin testing may be clinically indicated, consider ordering this additional test [id number] THIS CRITICAL RESULT HAS VERIFIED AND BEEN CALLED TO Delorise Jackson RN BY XIONG,KONG ON 07 29 2024 AT 0654, AND HAS BEEN READ BACK.     HCT 22.8 (L) 39.0 - 52.0 %   MCV 71.7 (L) 80.0 - 100.0 fL   MCH 21.7 (L) 26.0 - 34.0 pg   MCHC 30.3 30.0 - 36.0 g/dL   RDW 40.1 (H) 02.7 - 25.3 %   Platelets 255 150 - 400 K/uL   nRBC 0.0 0.0 - 0.2 %    Comment: Performed at [facility], [address] [place], [place] [address]  Comprehensive metabolic panel     Status: Abnormal   Collection Time: 10/20/22  5:39 AM  Result Value Ref Range   Sodium 136 135 - 145 mmol/L   Potassium 3.3 (L) 3.5 - 5.1 mmol/L   Chloride 107 98 - 111 mmol/L   CO2 22 22 - 32 mmol/L   Glucose, Bld 101 (H) 70 - 99 mg/dL    Comment: Glucose reference range applies only to samples taken after fasting for at least 8 hours.   BUN  19 6 - 20 mg/dL   Creatinine, Ser 3.47 0.61 - 1.24 mg/dL   Calcium 8.4 (L) 8.9 - 10.3 mg/dL   Total Protein 5.8 (L) 6.5 - 8.1 g/dL   Albumin 2.8 (L) 3.5 - 5.0 g/dL   AST 14 (L) 15 - 41 U/L   ALT 9 0 - 44 U/L   Alkaline Phosphatase 64 38 - 126 U/L   Total Bilirubin 0.5 0.3 - 1.2 mg/dL   GFR, Estimated >42 >59 mL/min    Comment: (NOTE) Calculated using the CKD-EPI Creatinine Equation (2021)    Anion gap 7 5 - 15    Comment: Performed at [facility], [address], [place], [place] [address]  Magnesium     Status: None   Collection Time: 10/20/22  5:39 AM  Result Value Ref Range   Magnesium 1.8 1.7 - 2.4 mg/dL    Comment: Performed at [facility], [address], [place], [place] [address]  Type and screen [facility]     Status: None   Collection Time: 10/20/22  8:35 AM  Result Value Ref Range   ABO/RH(D) A POS    Antibody Screen NEG    Sample Expiration 10/23/2022,2359    Unit Number [id number]    Blood Component Type RED CELLS,LR    Unit division 00    Status of Unit ISSUED,FINAL    Transfusion Status OK TO TRANSFUSE    Crossmatch Result Compatible    Unit Number [id number]    Blood Component Type RED CELLS,LR    Unit division 00    Status of Unit ISSUED,FINAL    Transfusion Status OK TO TRANSFUSE    Crossmatch Result      Compatible Performed at [facility], [address], [place], [place] [address]   Prepare RBC (crossmatch)     Status: None   Collection Time: 10/20/22  8:35 AM  Result Value Ref Range   Order Confirmation      ORDER PROCESSED BY BLOOD BANK Performed at [facility], [address] [place], [place] [address]   Hemoglobin and hematocrit, blood     Status: Abnormal   Collection Time: 10/20/22 11:46 PM  Result Value Ref Range   Hemoglobin 8.5 (L) 13.0 -  17.0 g/dL   HCT 16.1 (L) 09.6 - 04.5 %    Comment: Performed at [facility], [address] [place], [place] [address]  CBC     Status: Abnormal   Collection Time: 10/21/22  5:20 AM  Result Value Ref Range   WBC 5.9 4.0 - 10.5 K/uL   RBC 3.70 (L) 4.22 - 5.81 MIL/uL   Hemoglobin 8.6 (L) 13.0 - 17.0 g/dL    Comment: Reticulocyte Hemoglobin testing may be clinically indicated, consider ordering this additional test [id number]    HCT 28.3 (L) 39.0 - 52.0 %   MCV 76.5 (L) 80.0 - 100.0 fL   MCH 23.2 (L) 26.0 - 34.0 pg   MCHC 30.4 30.0 - 36.0 g/dL   RDW 56.2 (H) 13.0 - 86.5 %   Platelets 257 150 - 400 K/uL   nRBC 0.0 0.0 - 0.2 %    Comment: Performed at [facility], [address], [place], [place] [address]  Basic metabolic panel     Status: Abnormal   Collection Time: 10/21/22  5:20 AM  Result Value Ref Range   Sodium 134 (L) 135 - 145 mmol/L   Potassium 3.6 3.5 - 5.1 mmol/L   Chloride 105 98 - 111 mmol/L   CO2 22 22 - 32 mmol/L   Glucose, Bld 89 70 - 99 mg/dL    Comment: Glucose reference range applies only to samples taken after fasting for at least 8 hours.   BUN 22 (H) 6 - 20 mg/dL   Creatinine, Ser 6.29 0.61 - 1.24 mg/dL   Calcium 8.0 (L) 8.9 - 10.3 mg/dL   GFR, Estimated >52 >84 mL/min    Comment: (NOTE) Calculated using the CKD-EPI Creatinine Equation (2021)    Anion gap 7 5 - 15    Comment: Performed at [facility], [address] [place], [place] [address]    Studies/Results: No results found.  Medications: I have reviewed the patient's current medications.  Assessment: Transverse colon mass, biopsied, tattooed, results pending Small gastric ulcer, 15 mm cratered duodenal ulcer Hemoglobin improved from 6.9-8.5/8.6 posttransfusion, BUN 22 today,Hemodynamics are stable  Plan: Continue supportive management with pantoprazole 40 mg twice a day. Biopsies are pending.  Kerin Salen, MD 10/21/2022, 11:24 AM

## 2022-10-21 NOTE — Progress Notes (Signed)
PROGRESS NOTE    Marvin Thomas  GNF:[id number] DOB: [birth year] DOA: 10/16/2022 PCP: Pcp, No   Brief Narrative: 57 year old past medical history significant for COPD, Chronic anemia, gallstone, hemothorax, GERD, PUD/perforated pyloric ulcer status postrepair 2019, substance abuse (marijuana tobacco) presents complaining of right upper quadrant abdominal pain worse after eating or drinking.  He was found to have hemoglobin of 8.9 previously 10 and 12, AKI with a creatinine of 1.4, normal lipase and liver function test.  Right upper quadrant ultrasound showed hyperechoic lesion and a single sip 4 mm low risk gallbladder polyp.  Patient also reports melena.  MRI of the liver showed 4 aggressive appearing hepatic lesion which have imaging characteristics most suggestive of metastatic disease to the liver.  Further clinical evaluation to establish as source of primary malignancies recommended.  Patient underwent endoscopy and colonoscopy, endoscopy was found to have large duodenal ulcer, colonoscopy large polypoid masslike in the transverse or proximal colon.  Biopsy pending.  IR was consulted for liver biopsy but they would like to wait for polyp biopsy results.  He was also found to have a lung mass that he will need a PET scan as an outpatient.  He will need to follow-up with oncology  Assessment & Plan:   Principal Problem:   Abdominal pain Active Problems:   COPD (chronic obstructive pulmonary disease) (HCC)   Acute on chronic anemia   GI bleed   AKI (acute kidney injury) (HCC)   Bradycardia  1-Epigastric pain Acute on chronic blood loss anemia, Iron deficiency anemia;  Melena, GI bleed: -Continue with  Protonix BID -Underwent Endoscopy, Colonoscopy : Found to have Large duodenal ulcer, non bleeding, multiple small gastric ulcers. Large Polypoid mass like transverse or proximal colon. Awaiting Biopsy.  Hb down to 7.0. Received one unit PRBC> 7/27. Received IV Iron.  Hb down to 6,9. On  7/29. Received 2 units  PRBC transfusion 7/29. Denies Black stool.  Repeated Hb today at 8. Plan to monitor one more day.   2-AKI: -In setting of poor oral intake.  -Presented with Cr 1.4 Trending down.  -Treated  with IV fluids.  -Resolved.   3-Multiple liver lesions,  Lung Mass,  Large Polypoid Mass like transverse or proximal Colon:  -CT chest: 3 cm mass in the posterior right upper lobe with some dystrophic calcification. In addition there is an abnormal precarinal lymph node. 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  Underwent Endoscopy, Colonoscopy : Found to have Large duodenal ulcer, non bleeding, multiple small gastric ulcers. Large Polypoid mass like transverse or proximal colon. Awaiting Biopsy. -Pulmonology consulted. Recommend liver bx.  -Discussed with Dr Arbutus Ped, he recommend Liver biopsy.  -IR consulted for liver Biopsy. IR would like to wait for colon Bx results.   4-Bradycardia -Asymptomatic.  -Mg 1.8 -TSH: 1.018  Hypertension: Started  Norvasc.   Mild hyponatremia: -Continue with IV fluids.   Abnormal urinalysis pyuria: -Follow Urine Culture. Multiples species.   COPD: -PRN nebulizer.  -started Anoro Albuterol/    Estimated body mass index is 17.45 kg/m as calculated from the following:   Height as of this encounter: 5\' 9"  (1.753 m).   Weight as of this encounter: 53.6 kg.   DVT prophylaxis: SCD Code Status: Full code Family Communication: Care discussed with patient.  Disposition Plan:  Status is: Observation The patient will require care spanning > 2 midnights and should be moved to inpatient because: management of anemia. AKI. Monitor hb.     Consultants:  GI  Procedures:    Antimicrobials:    Subjective: He is alert, denies abdominal pain. He report BM brown stool.   Objective: Vitals:   10/21/22 0924 10/21/22 0926 10/21/22 1317 10/21/22 1439  BP:   138/75   Pulse:   64   Resp:   18   Temp:   98.4 F (36.9  C)   TempSrc:   Oral   SpO2: 98% 98% 100% 99%  Weight:      Height:        Intake/Output Summary (Last 24 hours) at 10/21/2022 1607 Last data filed at 10/21/2022 0919 Gross per 24 hour  Intake 863 ml  Output --  Net 863 ml   Filed Weights   10/16/22 2122 10/18/22 0712  Weight: 53.6 kg 53.6 kg    Examination:  General exam: NAD Respiratory system: CTA Cardiovascular system: S 1, S 2 RRR Gastrointestinal system: BS present, soft nt Central nervous system: Alert, follows command Extremities: no edema   Data Reviewed: I have personally reviewed following labs and imaging studies  CBC: Recent Labs  Lab 10/16/22 1338 10/17/22 0509 10/18/22 1230 10/18/22 1809 10/19/22 1248 10/20/22 0539 10/20/22 2346 10/21/22 0520  WBC 6.1 7.6 7.1  --  5.6 6.3  --  5.9  NEUTROABS 4.6  --   --   --   --   --   --   --   HGB 8.9* 8.9* 7.0* 7.4* 7.5* 6.9* 8.5* 8.6*  HCT 29.8* 30.5* 24.0* 24.6* 24.6* 22.8* 27.6* 28.3*  MCV 70.0* 72.3* 71.6*  --  71.5* 71.7*  --  76.5*  PLT 434* 328 273  --  270 255  --  257   Basic Metabolic Panel: Recent Labs  Lab 10/17/22 0509 10/18/22 1230 10/19/22 1248 10/20/22 0539 10/21/22 0520  NA 132* 133* 134* 136 134*  K 4.3 3.8 3.5 3.3* 3.6  CL 103 104 104 107 105  CO2 19* 22 22 22 22   GLUCOSE 77 79 91 101* 89  BUN 29* 16 17 19  22*  CREATININE 1.01 1.14 0.82 0.91 0.88  CALCIUM 8.6* 8.0* 8.3* 8.4* 8.0*  MG  --  1.6* 1.8 1.8  --    GFR: Estimated Creatinine Clearance: 70.2 mL/min (by C-G formula based on SCr of 0.88 mg/dL). Liver Function Tests: Recent Labs  Lab 10/16/22 1338 10/20/22 0539  AST 17 14*  ALT 13 9  ALKPHOS 97 64  BILITOT 0.8 0.5  PROT 8.1 5.8*  ALBUMIN 4.1 2.8*   Recent Labs  Lab 10/16/22 1338  LIPASE 30   No results for input(s): "AMMONIA" in the last 168 hours. Coagulation Profile: Recent Labs  Lab 10/20/22 0539  INR 1.2   Cardiac Enzymes: No results for input(s): "CKTOTAL", "CKMB", "CKMBINDEX", "TROPONINI" in  the last 168 hours. BNP (last 3 results) No results for input(s): "PROBNP" in the last 8760 hours. HbA1C: No results for input(s): "HGBA1C" in the last 72 hours. CBG: No results for input(s): "GLUCAP" in the last 168 hours. Lipid Profile: No results for input(s): "CHOL", "HDL", "LDLCALC", "TRIG", "CHOLHDL", "LDLDIRECT" in the last 72 hours. Thyroid Function Tests: No results for input(s): "TSH", "T4TOTAL", "FREET4", "T3FREE", "THYROIDAB" in the last 72 hours.  Anemia Panel: No results for input(s): "VITAMINB12", "FOLATE", "FERRITIN", "TIBC", "IRON", "RETICCTPCT" in the last 72 hours.  Sepsis Labs: No results for input(s): "PROCALCITON", "LATICACIDVEN" in the last 168 hours.  Recent Results (from the past 240 hour(s))  Urine Culture (for pregnant, neutropenic or urologic patients or patients with  an indwelling urinary catheter)     Status: Abnormal   Collection Time: 10/16/22  8:43 PM   Specimen: Urine, Clean Catch  Result Value Ref Range Status   Specimen Description   Final    URINE, CLEAN CATCH Performed at [facility], [address] [place], [place] [address]    Special Requests   Final    NONE Performed at [facility], [address], [place], [place] [address]    Culture MULTIPLE SPECIES PRESENT, SUGGEST RECOLLECTION (A)  Final   Report Status 10/18/2022 FINAL  Final         Radiology Studies: No results found.      Scheduled Meds:  [START ON 10/22/2022] albuterol  3 mL Inhalation Daily   vitamin B-12  1,000 mcg Oral Daily   feeding supplement  237 mL Oral TID BM   folic acid  1 mg Oral Daily   multivitamin with minerals  1 tablet Oral Daily   pantoprazole (PROTONIX) IV  40 mg Intravenous Q12H   umeclidinium-vilanterol  1 puff Inhalation Daily   Continuous Infusions:     LOS: 4 days    Time spent: 35 minutes.     Alba Cory, MD [facility]   If 7PM-7AM, please contact  night-coverage [email or web address]  10/21/2022, 4:07 PM

## 2022-10-21 NOTE — Plan of Care (Signed)
  Problem: Clinical Measurements: Goal: Ability to maintain clinical measurements within normal limits will improve Outcome: Progressing Goal: Will remain free from infection Outcome: Progressing   Problem: Pain Managment: Goal: General experience of comfort will improve Outcome: Progressing   Problem: Safety: Goal: Ability to remain free from injury will improve Outcome: Progressing   

## 2022-10-21 NOTE — Progress Notes (Signed)
Initial Nutrition Assessment  DOCUMENTATION CODES:   Non-severe (moderate) malnutrition in context of chronic illness, Underweight  INTERVENTION:   -Ensure Plus High Protein po TID, each supplement provides 350 kcal and 20 grams of protein.   -Multivitamin with minerals daily  NUTRITION DIAGNOSIS:   Moderate Malnutrition related to chronic illness as evidenced by moderate fat depletion, severe muscle depletion.  GOAL:   Patient will meet greater than or equal to 90% of their needs  MONITOR:   PO intake, Supplement acceptance, Labs, Weight trends, I & O's  REASON FOR ASSESSMENT:   Malnutrition Screening Tool    ASSESSMENT:   57 year old past medical history significant for COPD, Chronic anemia, gallstone, hemothorax, GERD, PUD/perforated pyloric ulcer status postrepair 2019, substance abuse (marijuana tobacco) presents complaining of right upper quadrant abdominal pain worse after eating or drinking.  7/27: EGD, colonoscopy  Patient in room, states he is feeling better. Pt reports good appetite and PO. States he thinks he ate a bad hamburger and that is why he was having stomach pain.  Pt with no issues with swallowing or chewing. Pt likes Ensure and would prefer to have these versus Boost Breeze, will order. Recommended pt take daily MVI as pt does not consume many fruits or vegetables. States he is a [employer]".   Per weight records, pt has lost 15 lbs since 08/27/21.  Current weight: 118 lbs.  Medications: Vitamin B-12, Folic acid  Labs reviewed: Low Na   NUTRITION - FOCUSED PHYSICAL EXAM:  Flowsheet Row Most Recent Value  Orbital Region Moderate depletion  Upper Arm Region Moderate depletion  Thoracic and Lumbar Region Moderate depletion  Buccal Region Severe depletion  Temple Region Severe depletion  Clavicle Bone Region Severe depletion  Clavicle and Acromion Bone Region Severe depletion  Scapular Bone Region Severe depletion  Dorsal Hand Moderate  depletion  Patellar Region Moderate depletion  Anterior Thigh Region Moderate depletion  Posterior Calf Region Moderate depletion  Edema (RD Assessment) None  Hair Reviewed  Eyes Reviewed  Mouth Reviewed  Skin Reviewed  Nails Reviewed       Diet Order:   Diet Order             DIET SOFT Fluid consistency: Thin  Diet effective now                   EDUCATION NEEDS:   Education needs have been addressed  Skin:  Skin Assessment: Reviewed RN Assessment  Last BM:  7/30  Height:   Ht Readings from Last 1 Encounters:  10/18/22 5\' 9"  (1.753 m)    Weight:   Wt Readings from Last 1 Encounters:  10/18/22 53.6 kg    BMI:  Body mass index is 17.45 kg/m.  Estimated Nutritional Needs:   Kcal:  2150-2350  Protein:  110-120g  Fluid:  2.1L/day   Tilda Franco, MS, RD, LDN Inpatient Clinical Dietitian Contact information available via Amion

## 2022-10-22 ENCOUNTER — Other Ambulatory Visit: Payer: Self-pay | Admitting: *Deleted

## 2022-10-22 DIAGNOSIS — E440 Moderate protein-calorie malnutrition: Secondary | ICD-10-CM | POA: Insufficient documentation

## 2022-10-22 DIAGNOSIS — K769 Liver disease, unspecified: Secondary | ICD-10-CM

## 2022-10-22 DIAGNOSIS — J449 Chronic obstructive pulmonary disease, unspecified: Secondary | ICD-10-CM

## 2022-10-22 LAB — SURGICAL PATHOLOGY

## 2022-10-22 MED ORDER — PEG 3350-KCL-NA BICARB-NACL 420 G PO SOLR
4000.0000 mL | Freq: Once | ORAL | Status: AC
  Administered 2022-10-22: 4000 mL via ORAL

## 2022-10-22 MED ORDER — PEG 3350-KCL-NA BICARB-NACL 420 G PO SOLR
4000.0000 mL | Freq: Once | ORAL | Status: AC
  Administered 2022-10-23: 4000 mL via ORAL

## 2022-10-22 MED ORDER — AMOXICILLIN 500 MG PO CAPS
1000.0000 mg | ORAL_CAPSULE | Freq: Two times a day (BID) | ORAL | Status: DC
  Administered 2022-10-22 – 2022-10-24 (×4): 1000 mg via ORAL
  Filled 2022-10-22 (×5): qty 2

## 2022-10-22 MED ORDER — CLARITHROMYCIN 250 MG PO TABS
500.0000 mg | ORAL_TABLET | Freq: Two times a day (BID) | ORAL | Status: DC
  Administered 2022-10-22 – 2022-10-24 (×4): 500 mg via ORAL
  Filled 2022-10-22 (×5): qty 2

## 2022-10-22 NOTE — Plan of Care (Signed)
  Problem: Health Behavior/Discharge Planning: Goal: Ability to manage health-related needs will improve Outcome: Progressing   Problem: Clinical Measurements: Goal: Will remain free from infection Outcome: Progressing   Problem: Education: Goal: Knowledge of General Education information will improve Description: Including pain rating scale, medication(s)/side effects and non-pharmacologic comfort measures Outcome: Not Progressing Note: Needs reinforcement on diet and medication orders for colonoscopy on Friday.

## 2022-10-22 NOTE — Progress Notes (Signed)
I scheduled pt for consult appt with Cassie Heilingoetter, PA-C and Dr Mosetta Putt for 8/2.  I called patient he did not answer and his mailbox was full.  I will call him again later today.

## 2022-10-22 NOTE — Progress Notes (Signed)
Referring Physician(s): Kathi Der, MD/Girgius, Onalee Hua, MD  Supervising Physician: Mir, Mauri Reading  Patient Status:  [facility] - In-pt  Chief Complaint: Colonic mass, liver lesions - request for biopsy.  Subjective:  Mr. Bonkowski seen at bedside this afternoon - his only complaint is not being able to eat solid foods and having to drink colonscopy prep for the next 2 days but he does understand why. We reviewed that pathology from his duodenal and gastric did not show malignancy so we are planning to proceed with liver biopsy - he is in agreement with this.   Please see formal consult note from Brayton El, PA-C 10/19/22 for full details.  Allergies: Aspirin  Medications: Prior to Admission medications   Medication Sig Start Date End Date Taking? Authorizing Provider  acetaminophen (TYLENOL) 500 MG tablet Take 1,000 mg by mouth every 6 (six) hours as needed for mild pain or headache.   Yes [provider]  BENADRYL ALLERGY 25 MG tablet Take 25-50 mg by mouth every 6 (six) hours as needed for sleep.   Yes [provider]  pantoprazole (PROTONIX) 20 MG tablet Take 2 tablets (40 mg total) by mouth daily. Patient not taking: Reported on 10/17/2022 12/13/17   Glenna Fellows, MD     Vital Signs: BP 111/76 (BP Location: Left Arm)   Pulse 76   Temp 98.2 F (36.8 C)   Resp 18   Ht 5\' 9"  (1.753 m)   Wt 118 lb 2.7 oz (53.6 kg)   SpO2 100%   BMI 17.45 kg/m   Physical Exam Vitals and nursing note reviewed.  Constitutional:      General: He is not in acute distress. HENT:     Head: Normocephalic.     Mouth/Throat:     Mouth: Mucous membranes are moist.     Pharynx: Oropharynx is clear. No oropharyngeal exudate or posterior oropharyngeal erythema.  Cardiovascular:     Rate and Rhythm: Normal rate and regular rhythm.  Pulmonary:     Effort: Pulmonary effort is normal.     Breath sounds: Normal breath sounds.  Abdominal:     General: There is no distension.      Palpations: Abdomen is soft.     Tenderness: There is no abdominal tenderness.  Skin:    General: Skin is warm and dry.  Neurological:     Mental Status: He is alert and oriented to person, place, and time.  Psychiatric:        Mood and Affect: Mood normal.        Behavior: Behavior normal.        Thought Content: Thought content normal.        Judgment: Judgment normal.     Imaging: No results found.  Labs:  CBC: Recent Labs    10/19/22 1248 10/20/22 0539 10/20/22 2346 10/21/22 0520 10/22/22 0538  WBC 5.6 6.3  --  5.9 6.6  HGB 7.5* 6.9* 8.5* 8.6* 8.4*  HCT 24.6* 22.8* 27.6* 28.3* 27.5*  PLT 270 255  --  257 261    COAGS: Recent Labs    10/20/22 0539  INR 1.2    BMP: Recent Labs    10/18/22 1230 10/19/22 1248 10/20/22 0539 10/21/22 0520  NA 133* 134* 136 134*  K 3.8 3.5 3.3* 3.6  CL 104 104 107 105  CO2 22 22 22 22   GLUCOSE 79 91 101* 89  BUN 16 17 19  22*  CALCIUM 8.0* 8.3* 8.4* 8.0*  CREATININE 1.14 0.82  0.91 0.88  GFRNONAA >60 >60 >60 >60    LIVER FUNCTION TESTS: Recent Labs    10/16/22 1338 10/20/22 0539  BILITOT 0.8 0.5  AST 17 14*  ALT 13 9  ALKPHOS 97 64  PROT 8.1 5.8*  ALBUMIN 4.1 2.8*    Assessment and Plan:  57 y/o M with recently noted colonic mass, duodenal ulcer, lung nodule and liver lesions concerning for metastatic disease of unknown primary seen today for liver lesion biopsy. IR originally consulted for same 7/28 with recommendations as follows:  If both GI biopsies negative, can proceed with liver lesion bx. If one bx is positive, recommend outpt PET scan for further workup. If both bx positive, but different, can proceed with liver bx for eval of mets/staging.  Given both biopsies are negative plan is to proceed with liver lesion biopsy, hopefully tomorrow. Patient is aware that the procedure may be delayed due to emergent cases in IR.   Patient will need to be NPO at midnight, CBC/INR AM 8/1, IR will call for  patient when ready.   Risks and benefits of liver lesion biopsy was discussed with the patient and/or patient's family including, but not limited to bleeding, infection, damage to adjacent structures or low yield requiring additional tests.  All of the questions were answered and there is agreement to proceed.  Consent signed and in chart.  Electronically Signed: Villa Herb, PA-C 10/22/2022, 3:31 PM   I spent a total of 15 Minutes at the the patient's bedside AND on the patient's hospital floor or unit, greater than 50% of which was counseling/coordinating care for liver lesions.

## 2022-10-22 NOTE — Progress Notes (Signed)
Progress Note    Marvin Thomas   ZOX:[id number]  DOB: 1965/04/05  DOA: 10/16/2022     5 PCP: Pcp, No  Initial CC: abdominal pain  Hospital Course: Mr. Popovich is a 57 yo male with PMH COPD, Chronic anemia, gallstone, hemothorax, GERD, PUD/perforated pyloric ulcer status post repair 2019, substance abuse (marijuana, tobacco) who presented with RUQ pain. Right upper quadrant ultrasound showed hyperechoic lesion and a single sub 4 mm low risk gallbladder polyp.  Patient also had reports of melena on admission. Further imaging studies were pursued.  MRI liver showed 4 hepatic lesions concerning for metastatic disease.  He underwent colonoscopy on 10/18/2022 noting nonobstructing large mass in the transverse colon with biopsies taken.  Biopsy results returned with tubulovillous adenoma. Prep for the colonoscopy was poor however. EGD was also performed which showed cratered duodenal ulcer and a gastric ulcer.  Interval History:  No events overnight.  Resting comfortably when seen this morning.  Denies any further melanotic stools nor any overt bleeding.  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 Pylori also positive; treatment to be started 7/31 - Hgb stable, 8.4 g/dL today - continue protonix   Transverse colon mass - concern for liver mets on MRI; also RUL 3 cm mass with dystrophic calcification  - s/p colonoscopy on 10/18/2022.  Biopsy returned with tubulovillous adenoma -IR consulted for possible liver biopsy if amenable  AKI - resolved  - suspected prerenal on admission - creat 1.47 on admission - normalized with IVF   Bradycardia  -Asymptomatic   Hypertension: Started  Norvasc   Mild hyponatremia -Continue with IV fluids   Abnormal urinalysis pyuria -urine culture multiples species.     COPD: -PRN nebulizer.  -started Anoro Albuterol   Old records reviewed in assessment of this patient  Antimicrobials:   DVT prophylaxis:  SCDs Start: 10/16/22 2044   Code Status:   Code Status: Full Code  Mobility Assessment (Last 72 Hours)     Mobility Assessment     Row Name 10/22/22 1100 10/21/22 2000 10/21/22 0945 10/20/22 2200 10/20/22 1014   Does patient have an order for bedrest or is patient medically unstable No - Continue assessment No - Continue assessment No - Continue assessment No - Continue assessment No - Continue assessment   What is the highest level of mobility based on the progressive mobility assessment? Level 6 (Walks independently in room and hall) - Balance while walking in room without assist - Complete Level 6 (Walks independently in room and hall) - Balance while walking in room without assist - Complete Level 6 (Walks independently in room and hall) - Balance while walking in room without assist - Complete Level 6 (Walks independently in room and hall) - Balance while walking in room without assist - Complete Level 6 (Walks independently in room and hall) - Balance while walking in room without assist - Complete            Barriers to discharge:  Disposition Plan:  Home Status is: Inpt  Objective: Blood pressure 111/76, pulse 76, temperature 98.2 F (36.8 C), resp. rate 18, height 5\' 9"  (1.753 m), weight 53.6 kg, SpO2 100%.  Examination:  Physical Exam Constitutional:      General: He is not in acute distress.    Appearance: Normal appearance.  HENT:  Head: Normocephalic and atraumatic.     Mouth/Throat:     Mouth: Mucous membranes are moist.  Eyes:     Extraocular Movements: Extraocular movements intact.  Cardiovascular:     Rate and Rhythm: Normal rate and regular rhythm.  Pulmonary:     Effort: Pulmonary effort is normal. No respiratory distress.     Breath sounds: Normal breath sounds. No wheezing.  Abdominal:     General:  Bowel sounds are normal. There is no distension.     Palpations: Abdomen is soft.     Tenderness: There is no abdominal tenderness.  Musculoskeletal:        General: Normal range of motion.     Cervical back: Normal range of motion and neck supple.  Skin:    General: Skin is warm and dry.  Neurological:     General: No focal deficit present.     Mental Status: He is alert.  Psychiatric:        Mood and Affect: Mood normal.        Behavior: Behavior normal.      Consultants:  GI IR  Procedures:  7/27: EGD and colonoscopy   Data Reviewed: Results for orders placed or performed during the hospital encounter of 10/16/22 (from the past 24 hour(s))  CBC     Status: Abnormal   Collection Time: 10/22/22  5:38 AM  Result Value Ref Range   WBC 6.6 4.0 - 10.5 K/uL   RBC 3.57 (L) 4.22 - 5.81 MIL/uL   Hemoglobin 8.4 (L) 13.0 - 17.0 g/dL   HCT 09.8 (L) 11.9 - 14.7 %   MCV 77.0 (L) 80.0 - 100.0 fL   MCH 23.5 (L) 26.0 - 34.0 pg   MCHC 30.5 30.0 - 36.0 g/dL   RDW 82.9 (H) 56.2 - 13.0 %   Platelets 261 150 - 400 K/uL   nRBC 0.0 0.0 - 0.2 %    I have reviewed pertinent nursing notes, vitals, labs, and images as necessary. I have ordered labwork to follow up on as indicated.  I have reviewed the last notes from staff over past 24 hours. I have discussed patient's care plan and test results with nursing staff, CM/SW, and other staff as appropriate.  Time spent: Greater than 50% of the 55 minute visit was spent in counseling/coordination of care for the patient as laid out in the A&P.   LOS: 5 days   Lewie Chamber, MD [facility] 10/22/2022, 5:04 PM

## 2022-10-22 NOTE — Progress Notes (Signed)
Referral entered for Oncology per inpatient team

## 2022-10-22 NOTE — Plan of Care (Signed)
  Problem: Clinical Measurements: Goal: Ability to maintain clinical measurements within normal limits will improve Outcome: Progressing Goal: Will remain free from infection Outcome: Progressing   Problem: Nutrition: Goal: Adequate nutrition will be maintained Outcome: Progressing   Problem: Coping: Goal: Level of anxiety will decrease Outcome: Progressing   Problem: Pain Managment: Goal: General experience of comfort will improve Outcome: Progressing   Problem: Safety: Goal: Ability to remain free from injury will improve Outcome: Progressing   

## 2022-10-22 NOTE — Hospital Course (Addendum)
Mr. Obeirne is a 57 yo male with PMH COPD, Chronic anemia, gallstone, hemothorax, GERD, PUD/perforated pyloric ulcer status post repair 2019, substance abuse (marijuana, tobacco) who presented with RUQ pain. Right upper quadrant ultrasound showed hyperechoic lesion and a single sub 4 mm low risk gallbladder polyp.  Patient also had reports of melena on admission. Further imaging studies were pursued.  MRI liver showed 4 hepatic lesions concerning for metastatic disease.  He underwent colonoscopy on 10/18/2022 noting nonobstructing large mass in the transverse colon with biopsies taken.  Biopsy results returned with tubulovillous adenoma. Prep for the colonoscopy was poor however. EGD was also performed which showed cratered duodenal ulcer and a gastric ulcer. He underwent repeat colonoscopy prep and repeat colonoscopy on 10/24/2022.  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  He was also able to undergo liver biopsy during hospitalization as well. All biopsy results were pending at time of discharge as patient was insistent on discharging home with outpatient follow-up with GI and oncology for results and further discussion.

## 2022-10-22 NOTE — Progress Notes (Signed)
Subjective: Patient has not had any black stools.  He was eating 2 burgers when I examined him.  Objective: Vital signs in last 24 hours: Temp:  [98.2 F (36.8 C)-98.6 F (37 C)] 98.2 F (36.8 C) (07/31 1357) Pulse Rate:  [64-78] 76 (07/31 1357) Resp:  [18-20] 18 (07/31 1357) BP: (111-153)/(76-80) 111/76 (07/31 1357) SpO2:  [97 %-100 %] 100 % (07/31 1357) Weight change:  Last BM Date : 10/21/22  PE: Not in distress GENERAL: Mild pallor  ABDOMEN: Nondistended abdomen EXTREMITIES: No deformity  Lab Results: Results for orders placed or performed during the hospital encounter of 10/16/22 (from the past 48 hour(s))  Hemoglobin and hematocrit, blood     Status: Abnormal   Collection Time: 10/20/22 11:46 PM  Result Value Ref Range   Hemoglobin 8.5 (L) 13.0 - 17.0 g/dL   HCT 60.6 (L) 30.1 - 60.1 %    Comment: Performed at [facility], [address], [place], [place] [address]  CBC     Status: Abnormal   Collection Time: 10/21/22  5:20 AM  Result Value Ref Range   WBC 5.9 4.0 - 10.5 K/uL   RBC 3.70 (L) 4.22 - 5.81 MIL/uL   Hemoglobin 8.6 (L) 13.0 - 17.0 g/dL    Comment: Reticulocyte Hemoglobin testing may be clinically indicated, consider ordering this additional test [id number]    HCT 28.3 (L) 39.0 - 52.0 %   MCV 76.5 (L) 80.0 - 100.0 fL   MCH 23.2 (L) 26.0 - 34.0 pg   MCHC 30.4 30.0 - 36.0 g/dL   RDW 54.2 (H) 70.6 - 23.7 %   Platelets 257 150 - 400 K/uL   nRBC 0.0 0.0 - 0.2 %    Comment: Performed at [facility] [address], [place], [place] [address]  Basic metabolic panel     Status: Abnormal   Collection Time: 10/21/22  5:20 AM  Result Value Ref Range   Sodium 134 (L) 135 - 145 mmol/L   Potassium 3.6 3.5 - 5.1 mmol/L   Chloride 105 98 - 111 mmol/L   CO2 22 22 - 32 mmol/L   Glucose, Bld 89 70 - 99 mg/dL    Comment: Glucose reference range applies only to samples taken after fasting for at least 8 hours.   BUN 22 (H) 6 -  20 mg/dL   Creatinine, Ser 5.17 0.61 - 1.24 mg/dL   Calcium 8.0 (L) 8.9 - 10.3 mg/dL   GFR, Estimated >61 >60 mL/min    Comment: (NOTE) Calculated using the CKD-EPI Creatinine Equation (2021)    Anion gap 7 5 - 15    Comment: Performed at [facility], [address], [place], [place] [address]  CBC     Status: Abnormal   Collection Time: 10/22/22  5:38 AM  Result Value Ref Range   WBC 6.6 4.0 - 10.5 K/uL   RBC 3.57 (L) 4.22 - 5.81 MIL/uL   Hemoglobin 8.4 (L) 13.0 - 17.0 g/dL    Comment: Reticulocyte Hemoglobin testing may be clinically indicated, consider ordering this additional test [id number]    HCT 27.5 (L) 39.0 - 52.0 %   MCV 77.0 (L) 80.0 - 100.0 fL   MCH 23.5 (L) 26.0 - 34.0 pg   MCHC 30.5 30.0 - 36.0 g/dL   RDW 62.7 (H) 03.5 - 00.9 %   Platelets 261 150 - 400 K/uL   nRBC 0.0 0.0 - 0.2 %    Comment: Performed at [facility], [address], [place], [place] [address]    Studies/Results: No results found.  Medications: I have reviewed the patient's current medications.  Assessment: Gastric and duodenal ulcers, biopsy showed chronic active peptic duodenitis and chronic active Helicobacter associated gastritis with focal intestinal metaplasia  Colonoscopy from 10/18/2022 showed a poor prep, scope advanced to transverse colon, biopsy showed fragments of tubulovillous adenoma, tattooed  CT chest: 3 cm posterior right upper lobe mass worrisome for malignancy  CT abdomen: Subtle ill-defined areas in liver, focal fatty infiltration versus masses Ultrasound: Heterogeneous liver with ill-defined hyperechoic masses 4 mm gallbladder polyp  MRI liver: For aggressive hepatic lesion suggestive of metastatic disease  Plan: I have placed orders for H. pylori treatment. Amoxicillin 1000 mg p.o. twice a day for 10 days along with clarithromycin 500 mg p.o. twice a day for 10 days, he is already on pantoprazole twice a day.  I have  discontinued his diet and changed to clear liquid diet. As he had poor prep he will need 2-day of prep. I will give him a gallon of GoLytely today and another gallon of GoLytely tomorrow, with plans for colonoscopy on Friday. Recommend proceeding with IR guided biopsy of liver lesion.  Kerin Salen, MD 10/22/2022, 2:03 PM

## 2022-10-23 ENCOUNTER — Inpatient Hospital Stay (HOSPITAL_COMMUNITY): Payer: No Typology Code available for payment source

## 2022-10-23 DIAGNOSIS — N179 Acute kidney failure, unspecified: Secondary | ICD-10-CM | POA: Diagnosis not present

## 2022-10-23 DIAGNOSIS — R109 Unspecified abdominal pain: Secondary | ICD-10-CM | POA: Diagnosis not present

## 2022-10-23 MED ORDER — MIDAZOLAM HCL 2 MG/2ML IJ SOLN
INTRAMUSCULAR | Status: AC
  Filled 2022-10-23: qty 2

## 2022-10-23 MED ORDER — GELATIN ABSORBABLE 12-7 MM EX MISC
CUTANEOUS | Status: AC
  Filled 2022-10-23: qty 1

## 2022-10-23 MED ORDER — ALBUTEROL SULFATE HFA 108 (90 BASE) MCG/ACT IN AERS
2.0000 | INHALATION_SPRAY | RESPIRATORY_TRACT | Status: DC | PRN
  Administered 2022-10-23: 2 via RESPIRATORY_TRACT
  Filled 2022-10-23: qty 6.7

## 2022-10-23 MED ORDER — FENTANYL CITRATE (PF) 100 MCG/2ML IJ SOLN
INTRAMUSCULAR | Status: AC | PRN
  Administered 2022-10-23 (×2): 50 ug via INTRAVENOUS

## 2022-10-23 MED ORDER — IPRATROPIUM-ALBUTEROL 0.5-2.5 (3) MG/3ML IN SOLN: 3.0000 mL | Freq: Four times a day (QID) | RESPIRATORY_TRACT | Status: DC | PRN

## 2022-10-23 MED ORDER — LIDOCAINE HCL 1 % IJ SOLN
INTRAMUSCULAR | Status: AC
  Filled 2022-10-23: qty 20

## 2022-10-23 MED ORDER — FENTANYL CITRATE (PF) 100 MCG/2ML IJ SOLN
INTRAMUSCULAR | Status: AC
  Filled 2022-10-23: qty 2

## 2022-10-23 MED ORDER — LIDOCAINE HCL (PF) 1 % IJ SOLN
INTRAMUSCULAR | Status: AC | PRN
  Administered 2022-10-23: 10 mL

## 2022-10-23 MED ORDER — MIDAZOLAM HCL 2 MG/2ML IJ SOLN
INTRAMUSCULAR | Status: AC | PRN
  Administered 2022-10-23 (×2): 1 mg via INTRAVENOUS

## 2022-10-23 MED ORDER — GELATIN ABSORBABLE 12-7 MM EX MISC
CUTANEOUS | Status: AC | PRN
  Administered 2022-10-23: 1

## 2022-10-23 NOTE — Procedures (Signed)
Interventional Radiology Procedure Note  Procedure: US guided right liver lesion biopsy  Indication: Right liver mass  Findings: Please refer to procedural dictation for full description.  Complications: None  EBL: < 10 mL  Miachel Roux, MD [phone]

## 2022-10-23 NOTE — Progress Notes (Signed)
Subjective: Patient was in the shower and was not examined at bedside.  He reports drinking three fourths of the gallon of prep yesterday, has 6 ounces (the first gallon.  He has 1 more gallon of colonic prep to finish today.  Objective: Vital signs in last 24 hours: Temp:  [98.2 F (36.8 C)-98.7 F (37.1 C)] 98.6 F (37 C) (08/01 0603) Pulse Rate:  [65-76] 65 (08/01 0603) Resp:  [18] 18 (08/01 0603) BP: (108-137)/(55-85) 108/55 (08/01 0603) SpO2:  [99 %-100 %] 99 % (08/01 0641) Weight change:  Last BM Date : 10/22/22  Not seen or examined Denies abdominal pain or black stools  Lab Results: Results for orders placed or performed during the hospital encounter of 10/16/22 (from the past 48 hour(s))  CBC     Status: Abnormal   Collection Time: 10/22/22  5:38 AM  Result Value Ref Range   WBC 6.6 4.0 - 10.5 K/uL   RBC 3.57 (L) 4.22 - 5.81 MIL/uL   Hemoglobin 8.4 (L) 13.0 - 17.0 g/dL    Comment: Reticulocyte Hemoglobin testing may be clinically indicated, consider ordering this additional test [id number]    HCT 27.5 (L) 39.0 - 52.0 %   MCV 77.0 (L) 80.0 - 100.0 fL   MCH 23.5 (L) 26.0 - 34.0 pg   MCHC 30.5 30.0 - 36.0 g/dL   RDW 95.6 (H) 38.7 - 56.4 %   Platelets 261 150 - 400 K/uL   nRBC 0.0 0.0 - 0.2 %    Comment: Performed at [facility], [address], [place], [place] [address]  CBC     Status: Abnormal   Collection Time: 10/23/22  5:17 AM  Result Value Ref Range   WBC 6.9 4.0 - 10.5 K/uL   RBC 3.73 (L) 4.22 - 5.81 MIL/uL   Hemoglobin 8.6 (L) 13.0 - 17.0 g/dL    Comment: Reticulocyte Hemoglobin testing may be clinically indicated, consider ordering this additional test [id number]    HCT 28.2 (L) 39.0 - 52.0 %   MCV 75.6 (L) 80.0 - 100.0 fL   MCH 23.1 (L) 26.0 - 34.0 pg   MCHC 30.5 30.0 - 36.0 g/dL   RDW 63.0 (H) 16.0 - 10.9 %   Platelets 263 150 - 400 K/uL   nRBC 0.0 0.0 - 0.2 %    Comment: Performed at [facility], [address] [place], [place] [address]  Protime-INR     Status: None   Collection Time: 10/23/22  5:17 AM  Result Value Ref Range   Prothrombin Time 14.8 11.4 - 15.2 seconds   INR 1.1 0.8 - 1.2    Comment: (NOTE) INR goal varies based on device and disease states. Performed at [facility], [address], [place], [place] [address]     Studies/Results: No results found.  Medications: I have reviewed the patient's current medications.  Assessment: H. 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 pylori treatment.  Kerin Salen, MD 10/23/2022, 9:35 AM

## 2022-10-23 NOTE — Sedation Documentation (Signed)
Sample #2 obtained 

## 2022-10-23 NOTE — H&P (View-Only) (Signed)
Subjective: Patient was in the shower and was not examined at bedside.  He reports drinking three fourths of the gallon of prep yesterday, has 6 ounces (the first gallon.  He has 1 more gallon of colonic prep to finish today.  Objective: Vital signs in last 24 hours: Temp:  [98.2 F (36.8 C)-98.7 F (37.1 C)] 98.6 F (37 C) (08/01 0603) Pulse Rate:  [65-76] 65 (08/01 0603) Resp:  [18] 18 (08/01 0603) BP: (108-137)/(55-85) 108/55 (08/01 0603) SpO2:  [99 %-100 %] 99 % (08/01 0641) Weight change:  Last BM Date : 10/22/22  Not seen or examined Denies abdominal pain or black stools  Lab Results: Results for orders placed or performed during the hospital encounter of 10/16/22 (from the past 48 hour(s))  CBC     Status: Abnormal   Collection Time: 10/22/22  5:38 AM  Result Value Ref Range   WBC 6.6 4.0 - 10.5 K/uL   RBC 3.57 (L) 4.22 - 5.81 MIL/uL   Hemoglobin 8.4 (L) 13.0 - 17.0 g/dL    Comment: Reticulocyte Hemoglobin testing may be clinically indicated, consider ordering this additional test [id number]    HCT 27.5 (L) 39.0 - 52.0 %   MCV 77.0 (L) 80.0 - 100.0 fL   MCH 23.5 (L) 26.0 - 34.0 pg   MCHC 30.5 30.0 - 36.0 g/dL   RDW 95.6 (H) 38.7 - 56.4 %   Platelets 261 150 - 400 K/uL   nRBC 0.0 0.0 - 0.2 %    Comment: Performed at [facility], [address], [place], [place] [address]  CBC     Status: Abnormal   Collection Time: 10/23/22  5:17 AM  Result Value Ref Range   WBC 6.9 4.0 - 10.5 K/uL   RBC 3.73 (L) 4.22 - 5.81 MIL/uL   Hemoglobin 8.6 (L) 13.0 - 17.0 g/dL    Comment: Reticulocyte Hemoglobin testing may be clinically indicated, consider ordering this additional test [id number]    HCT 28.2 (L) 39.0 - 52.0 %   MCV 75.6 (L) 80.0 - 100.0 fL   MCH 23.1 (L) 26.0 - 34.0 pg   MCHC 30.5 30.0 - 36.0 g/dL   RDW 63.0 (H) 16.0 - 10.9 %   Platelets 263 150 - 400 K/uL   nRBC 0.0 0.0 - 0.2 %    Comment: Performed at [facility], [address] [place], [place] [address]  Protime-INR     Status: None   Collection Time: 10/23/22  5:17 AM  Result Value Ref Range   Prothrombin Time 14.8 11.4 - 15.2 seconds   INR 1.1 0.8 - 1.2    Comment: (NOTE) INR goal varies based on device and disease states. Performed at [facility], [address], [place], [place] [address]     Studies/Results: No results found.  Medications: I have reviewed the patient's current medications.  Assessment: H. 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 pylori treatment.  Kerin Salen, MD 10/23/2022, 9:35 AM

## 2022-10-23 NOTE — Sedation Documentation (Signed)
Sample #4 obtained 

## 2022-10-23 NOTE — Plan of Care (Signed)
  Problem: Education: Goal: Knowledge of General Education information will improve Description: Including pain rating scale, medication(s)/side effects and non-pharmacologic comfort measures Outcome: Progressing   Problem: Clinical Measurements: Goal: Ability to maintain clinical measurements within normal limits will improve Outcome: Progressing Goal: Diagnostic test results will improve Outcome: Progressing Goal: Respiratory complications will improve Outcome: Progressing Goal: Cardiovascular complication will be avoided Outcome: Progressing   Problem: Activity: Goal: Risk for activity intolerance will decrease Outcome: Progressing   Problem: Nutrition: Goal: Adequate nutrition will be maintained Outcome: Progressing   Problem: Coping: Goal: Level of anxiety will decrease Outcome: Progressing   Problem: Elimination: Goal: Will not experience complications related to bowel motility Outcome: Progressing   Problem: Safety: Goal: Ability to remain free from injury will improve Outcome: Progressing   Problem: Skin Integrity: Goal: Risk for impaired skin integrity will decrease Outcome: Progressing

## 2022-10-23 NOTE — Sedation Documentation (Signed)
Sample #3 obtained 

## 2022-10-23 NOTE — Progress Notes (Signed)
Progress Note    Marvin Thomas   XLK:[id number]  DOB: 06/20/1965  DOA: 10/16/2022     6 PCP: Pcp, No  Initial CC: abdominal pain  Hospital Course: Marvin Thomas is a 57 yo male with PMH COPD, Chronic anemia, gallstone, hemothorax, GERD, PUD/perforated pyloric ulcer status post repair 2019, substance abuse (marijuana, tobacco) who presented with RUQ pain. Right upper quadrant ultrasound showed hyperechoic lesion and a single sub 4 mm low risk gallbladder polyp.  Patient also had reports of melena on admission. Further imaging studies were pursued.  MRI liver showed 4 hepatic lesions concerning for metastatic disease.  He underwent colonoscopy on 10/18/2022 noting nonobstructing large mass in the transverse colon with biopsies taken.  Biopsy results returned with tubulovillous adenoma. Prep for the colonoscopy was poor however. EGD was also performed which showed cratered duodenal ulcer and a gastric ulcer.  Interval History:  No events overnight. Sitting in recliner when seen this morning. Able to get liver biopsy today and will undergo repeat colonoscopy tomorrow.   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 Pylori also positive; treatment to be started 7/31 - Hgb stable, 8.6 g/dL today - continue protonix   Transverse colon mass - concern for liver mets on MRI; also RUL 3 cm mass with dystrophic calcification  - s/p colonoscopy on 10/18/2022.  Biopsy returned with tubulovillous adenoma -IR consulted; underwent liver biopsy on 10/23/2022  AKI - resolved  - suspected prerenal on admission - creat 1.47 on admission - normalized with IVF   Bradycardia  -Asymptomatic   Hypertension: Started Norvasc   Mild hyponatremia -Continue with IV fluids   Abnormal urinalysis pyuria -urine culture multiples  species.    COPD: -PRN nebulizer.  -started Anoro Albuterol   Old records reviewed in assessment of this patient  Antimicrobials:   DVT prophylaxis:  SCDs Start: 10/16/22 2044   Code Status:   Code Status: Full Code  Mobility Assessment (Last 72 Hours)     Mobility Assessment     Row Name 10/23/22 0800 10/22/22 2200 10/22/22 1100 10/21/22 2000 10/21/22 0945   Does patient have an order for bedrest or is patient medically unstable No - Continue assessment No - Continue assessment No - Continue assessment No - Continue assessment No - Continue assessment   What is the highest level of mobility based on the progressive mobility assessment? Level 6 (Walks independently in room and hall) - Balance while walking in room without assist - Complete Level 6 (Walks independently in room and hall) - Balance while walking in room without assist - Complete Level 6 (Walks independently in room and hall) - Balance while walking in room without assist - Complete Level 6 (Walks independently in room and hall) - Balance while walking in room without assist - Complete Level 6 (Walks independently in room and hall) - Balance while walking in room without assist - Complete    Row Name 10/20/22 2200           Does patient have an order for bedrest or is patient medically unstable No - Continue assessment       What is the highest level of mobility based on the progressive mobility assessment? Level 6 (Walks independently in room and hall) - Balance while walking in room without assist - Complete  Mobility Assessment (Last 72 Hours)     Mobility Assessment     Row Name 10/23/22 0800 10/22/22 2200 10/22/22 1100 10/21/22 2000 10/21/22 0945   Does patient have an order for bedrest or is patient medically unstable No - Continue assessment No - Continue assessment No - Continue assessment No - Continue assessment No - Continue assessment   What is the highest level of mobility based on  the progressive mobility assessment? Level 6 (Walks independently in room and hall) - Balance while walking in room without assist - Complete Level 6 (Walks independently in room and hall) - Balance while walking in room without assist - Complete Level 6 (Walks independently in room and hall) - Balance while walking in room without assist - Complete Level 6 (Walks independently in room and hall) - Balance while walking in room without assist - Complete Level 6 (Walks independently in room and hall) - Balance while walking in room without assist - Complete    Row Name 10/20/22 2200           Does patient have an order for bedrest or is patient medically unstable No - Continue assessment       What is the highest level of mobility based on the progressive mobility assessment? Level 6 (Walks independently in room and hall) - Balance while walking in room without assist - Complete                Barriers to discharge:  Disposition Plan:  Home Status is: Inpt  Objective: Blood pressure 106/63, pulse (!) 59, temperature 98.1 F (36.7 C), temperature source Oral, resp. rate 18, height 5\' 9"  (1.753 m), weight 53.6 kg, SpO2 100%.  Examination:  Physical Exam Constitutional:      General: He is not in acute distress.    Appearance: Normal appearance.  HENT:     Head: Normocephalic and atraumatic.     Mouth/Throat:     Mouth: Mucous membranes are moist.  Eyes:     Extraocular Movements: Extraocular movements intact.  Cardiovascular:     Rate and Rhythm: Normal rate and regular rhythm.  Pulmonary:     Effort: Pulmonary effort is normal. No respiratory distress.     Breath sounds: Normal breath sounds. No wheezing.  Abdominal:     General: Bowel sounds are normal. There is no distension.     Palpations: Abdomen is soft.     Tenderness: There is no abdominal tenderness.  Musculoskeletal:        General: Normal range of motion.     Cervical back: Normal range of motion and neck supple.   Skin:    General: Skin is warm and dry.  Neurological:     General: No focal deficit present.     Mental Status: He is alert.  Psychiatric:        Mood and Affect: Mood normal.        Behavior: Behavior normal.      Consultants:  GI IR  Procedures:  7/27: EGD and colonoscopy   Data Reviewed: Results for orders placed or performed during the hospital encounter of 10/16/22 (from the past 24 hour(s))  CBC     Status: Abnormal   Collection Time: 10/23/22  5:17 AM  Result Value Ref Range   WBC 6.9 4.0 - 10.5 K/uL   RBC 3.73 (L) 4.22 - 5.81 MIL/uL   Hemoglobin 8.6 (L) 13.0 - 17.0 g/dL   HCT 40.9 (L) 81.1 - 91.4 %  MCV 75.6 (L) 80.0 - 100.0 fL   MCH 23.1 (L) 26.0 - 34.0 pg   MCHC 30.5 30.0 - 36.0 g/dL   RDW 96.0 (H) 45.4 - 09.8 %   Platelets 263 150 - 400 K/uL   nRBC 0.0 0.0 - 0.2 %  Protime-INR     Status: None   Collection Time: 10/23/22  5:17 AM  Result Value Ref Range   Prothrombin Time 14.8 11.4 - 15.2 seconds   INR 1.1 0.8 - 1.2    I have reviewed pertinent nursing notes, vitals, labs, and images as necessary. I have ordered labwork to follow up on as indicated.  I have reviewed the last notes from staff over past 24 hours. I have discussed patient's care plan and test results with nursing staff, CM/SW, and other staff as appropriate.    LOS: 6 days   Lewie Chamber, MD [facility] 10/23/2022, 4:42 PM

## 2022-10-23 NOTE — Sedation Documentation (Signed)
Sample #1 obtained 

## 2022-10-23 NOTE — Sedation Documentation (Signed)
Needle removed and gel foam inserted into tract

## 2022-10-24 ENCOUNTER — Inpatient Hospital Stay (HOSPITAL_COMMUNITY): Payer: No Typology Code available for payment source | Admitting: Anesthesiology

## 2022-10-24 ENCOUNTER — Encounter (HOSPITAL_COMMUNITY): Payer: Self-pay | Admitting: Internal Medicine

## 2022-10-24 ENCOUNTER — Encounter (HOSPITAL_COMMUNITY)
Admission: EM | Disposition: A | Discharge status: Home / Self Care | Payer: Self-pay | Source: Home / Self Care | Attending: Internal Medicine

## 2022-10-24 ENCOUNTER — Inpatient Hospital Stay: Payer: Self-pay | Admitting: Physician Assistant

## 2022-10-24 DIAGNOSIS — D126 Benign neoplasm of colon, unspecified: Secondary | ICD-10-CM | POA: Diagnosis not present

## 2022-10-24 DIAGNOSIS — F17210 Nicotine dependence, cigarettes, uncomplicated: Secondary | ICD-10-CM | POA: Diagnosis not present

## 2022-10-24 DIAGNOSIS — R109 Unspecified abdominal pain: Secondary | ICD-10-CM | POA: Diagnosis not present

## 2022-10-24 DIAGNOSIS — D649 Anemia, unspecified: Secondary | ICD-10-CM

## 2022-10-24 DIAGNOSIS — R933 Abnormal findings on diagnostic imaging of other parts of digestive tract: Secondary | ICD-10-CM

## 2022-10-24 DIAGNOSIS — D123 Benign neoplasm of transverse colon: Secondary | ICD-10-CM

## 2022-10-24 DIAGNOSIS — J449 Chronic obstructive pulmonary disease, unspecified: Secondary | ICD-10-CM

## 2022-10-24 HISTORY — PX: POLYPECTOMY: SHX5525

## 2022-10-24 HISTORY — PX: BIOPSY: SHX5522

## 2022-10-24 HISTORY — PX: COLONOSCOPY WITH PROPOFOL: SHX5780

## 2022-10-24 SURGERY — COLONOSCOPY WITH PROPOFOL
Anesthesia: Monitor Anesthesia Care

## 2022-10-24 MED ORDER — LACTATED RINGERS IV SOLN: INTRAVENOUS | Status: DC

## 2022-10-24 MED ORDER — PANTOPRAZOLE SODIUM 40 MG PO TBEC: 40.0000 mg | DELAYED_RELEASE_TABLET | Freq: Every day | ORAL | 3 refills | Status: DC

## 2022-10-24 MED ORDER — AMOXICILLIN 500 MG PO CAPS: 1000.0000 mg | ORAL_CAPSULE | Freq: Two times a day (BID) | ORAL | 0 refills | Status: AC

## 2022-10-24 MED ORDER — LIDOCAINE 2% (20 MG/ML) 5 ML SYRINGE
INTRAMUSCULAR | Status: DC | PRN
  Administered 2022-10-24: 60 mg via INTRAVENOUS

## 2022-10-24 MED ORDER — PROPOFOL 500 MG/50ML IV EMUL
INTRAVENOUS | Status: DC | PRN
  Administered 2022-10-24: 100 ug/kg/min via INTRAVENOUS

## 2022-10-24 MED ORDER — SODIUM CHLORIDE 0.9 % IV SOLN: INTRAVENOUS | Status: DC

## 2022-10-24 MED ORDER — CLARITHROMYCIN 500 MG PO TABS: 500.0000 mg | ORAL_TABLET | Freq: Two times a day (BID) | ORAL | 0 refills | Status: AC

## 2022-10-24 MED ORDER — PROPOFOL 10 MG/ML IV BOLUS
INTRAVENOUS | Status: DC | PRN
  Administered 2022-10-24: 20 mg via INTRAVENOUS
  Administered 2022-10-24: 50 mg via INTRAVENOUS
  Administered 2022-10-24: 20 mg via INTRAVENOUS
  Administered 2022-10-24: 30 mg via INTRAVENOUS

## 2022-10-24 MED ORDER — CYANOCOBALAMIN 1000 MCG PO TABS: 1000.0000 ug | ORAL_TABLET | Freq: Every day | ORAL | Status: DC

## 2022-10-24 SURGICAL SUPPLY — 22 items

## 2022-10-24 NOTE — Interval H&P Note (Signed)
History and Physical Interval Note: 57/male with colon mass, s/p liver mass biopsy, previous colon showed poor prep, repeat colon with 2 day prep.  10/24/2022 1:37 PM  Marvin Thomas  has presented today for colonoscopy, with the diagnosis of transverse colon mass, liver and lung mass ?metastasis, poor prep on 10/18/22.  The various methods of treatment have been discussed with the patient and family. After consideration of risks, benefits and other options for treatment, the patient has consented to  Procedure(s): COLONOSCOPY WITH PROPOFOL (N/A) as a surgical intervention.  The patient's history has been reviewed, patient examined, no change in status, stable for surgery.  I have reviewed the patient's chart and labs.  Questions were answered to the patient's satisfaction.     Kerin Salen

## 2022-10-24 NOTE — Transfer of Care (Signed)
Immediate Anesthesia Transfer of Care Note  Patient: Marvin Thomas  Procedure(s) Performed: COLONOSCOPY WITH PROPOFOL POLYPECTOMY  Patient Location: PACU  Anesthesia Type:MAC  Level of Consciousness: awake, alert , oriented, and patient cooperative  Airway & Oxygen Therapy: Patient Spontanous Breathing and Patient connected to face mask oxygen  Post-op Assessment: Report given to RN and Post -op Vital signs reviewed and stable  Post vital signs: Reviewed and stable  Last Vitals:  Vitals Value Taken Time  BP    Temp    Pulse    Resp    SpO2      Last Pain:  Vitals:   10/24/22 1325  TempSrc: Temporal  PainSc: 0-No pain      Patients Stated Pain Goal: 0 (10/18/22 1955)  Complications: No notable events documented.

## 2022-10-24 NOTE — Anesthesia Postprocedure Evaluation (Signed)
Anesthesia Post Note  Patient: ARAM DOMZALSKI  Procedure(s) Performed: COLONOSCOPY WITH PROPOFOL POLYPECTOMY     Patient location during evaluation: Endoscopy Anesthesia Type: MAC Level of consciousness: awake and alert, patient cooperative and oriented Pain management: pain level controlled Vital Signs Assessment: post-procedure vital signs reviewed and stable Respiratory status: nonlabored ventilation, spontaneous breathing and respiratory function stable Cardiovascular status: blood pressure returned to baseline and stable Postop Assessment: no apparent nausea or vomiting Anesthetic complications: no   No notable events documented.  Last Vitals:  Vitals:   10/24/22 1440 10/24/22 1450  BP: (!) 118/51 (!) 116/59  Pulse: (!) 59 61  Resp: 18 20  Temp:    SpO2: 100% 100%    Last Pain:  Vitals:   10/24/22 1450  TempSrc:   PainSc: 0-No pain                 ,E. 

## 2022-10-24 NOTE — Op Note (Signed)
[facility] Patient Name: Marvin Thomas Procedure Date: 10/24/2022 MRN: [id number] Attending MD: Kerin Salen , MD, [id number] Date of Birth: November 10, 1965 CSN: [id number] Age: 57 Admit Type: Inpatient Procedure:                Colonoscopy Indications:              Poor prep on 10/18/22, transverse mass showed                            tubulovillous adenoma, liver mass and lung mass                            suspicious for malignancy, Last colonoscopy: July                            2024 Providers:                Kerin Salen, MD, Marge Duncans, RN, Cephus Richer,                            RN, Marja Kays, Technician Referring MD:             [facility] Medicines:                See the Anesthesia note for documentation of the                            administered medications Complications:            No immediate complications. Estimated blood loss:                            Minimal. Estimated Blood Loss:     Estimated blood loss was minimal. Procedure:                Pre-Anesthesia Assessment:                           - Prior to the procedure, a History and Physical                            was performed, and patient medications and                            allergies were reviewed. The patient's tolerance of                            previous anesthesia was also reviewed. The risks                            and benefits of the procedure and the sedation                            options and risks were discussed with the patient.                            All questions were  answered, and informed consent                            was obtained. Prior Anticoagulants: The patient has                            taken no anticoagulant or antiplatelet agents. ASA                            Grade Assessment: III - A patient with severe                            systemic disease. After reviewing the risks and                            benefits, the  patient was deemed in satisfactory                            condition to undergo the procedure.                           After obtaining informed consent, the colonoscope                            was passed under direct vision. Throughout the                            procedure, the patient's blood pressure, pulse, and                            oxygen saturations were monitored continuously. The                            [id number]-HQ190L ([id number]) Olympus colonoscope was                            introduced through the anus and advanced to the the                            cecum, identified by appendiceal orifice and                            ileocecal valve. The colonoscopy was performed                            without difficulty. The patient tolerated the                            procedure well. The quality of the bowel                            preparation was fair despite a 2 day prep. The  ileocecal valve, appendiceal orifice, and rectum                            were photographed. Scope In: 1:47:15 PM Scope Out: 2:21:24 PM Scope Withdrawal Time: 0 hours 28 minutes 43 seconds  Total Procedure Duration: 0 hours 34 minutes 9 seconds  Findings:      The perianal and digital rectal examinations were normal.      Two semi-sessile polyps were found in the colon. The polyps were 10 to       15 mm in size. These polyps were removed with a hot snare. Resection was       complete, but the polyp tissue was not retrieved.      An infiltrative, polypoid, sessile and ulcerated partially obstructing       large mass was found at the ileocecal valve. The mass was partially       circumferential (involving one-third of the lumen circumference). No       bleeding was present. This was biopsied with a cold forceps for       histology.      A large polypoid lesion was found in the transverse colon at 60 cm,       previously tattooed. The lesion was  semi-pedunculated. No bleeding was       present. This was biopsied with a cold forceps for histology.The lesion       was too large for endoscopic removal.      A 20 mm polyp was found in the transverse colon. The polyp was sessile.       The polyp was removed with a piecemeal technique using a hot snare.       Resection and retrieval were complete.      Two sessile polyps were found in the sigmoid colon. The polyps were 10       to 20 mm in size. These polyps were removed with a piecemeal technique       using a hot snare. Resection and retrieval were complete.      Non-bleeding internal hemorrhoids were found during retroflexion. The       hemorrhoids were large. Impression:               - Preparation of the colon was fair.                           - Two 10 to 15 mm polyps, removed with a hot snare.                            Complete resection. Polyp tissue not retrieved.                           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 Biopsied.                           - Rule out malignancy, polypoid lesion in the  transverse colon. Biopsied.                           - One 20 mm polyp in the transverse colon, removed                            piecemeal using a hot snare. Resected and retrieved.                           - Two 10 to 20 mm polyps in the sigmoid colon,                            removed piecemeal using a hot snare. Resected and                            retrieved.                           - Non-bleeding internal hemorrhoids. Moderate Sedation:      Patient did not receive moderate sedation for this procedure, but       instead received monitored anesthesia care. Recommendation:           - Resume regular diet.                           - Continue present medications.                           - Await pathology results. 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 All rights reserved. The codes documented in this report are preliminary and upon coder review may  be revised to meet current compliance requirements. Kerin Salen, MD 10/24/2022 2:31:44 PM This report has been signed electronically. Number of Addenda: 0

## 2022-10-24 NOTE — Plan of Care (Signed)

## 2022-10-24 NOTE — Anesthesia Preprocedure Evaluation (Addendum)
Anesthesia Evaluation  Patient identified by MRN, date of birth, ID band Patient awake    Reviewed: Allergy & Precautions, NPO status , Patient's Chart, lab work & pertinent test results  History of Anesthesia Complications Negative for: history of anesthetic complications  Airway Mallampati: I  TM Distance: >3 FB Neck ROM: Full    Dental  (+) Dental Advisory Given   Pulmonary COPD,  COPD inhaler, Current Smoker and Patient abstained from smoking. 3 cm mass in the posterior right upper lobe with some dystrophic calcification. In addition there is an abnormal precarinal lymph node. Malignancy is possible    breath sounds clear to auscultation       Cardiovascular (-) hypertensionnegative cardio ROS  Rhythm:Regular Rate:Normal     Neuro/Psych negative neurological ROS     GI/Hepatic ,GERD  Medicated and Controlled,,(+)     substance abuse  marijuana use? liver mets Transverse colon mass   Endo/Other  negative endocrine ROS    Renal/GU Renal InsufficiencyRenal disease     Musculoskeletal   Abdominal   Peds  Hematology  (+) Blood dyscrasia (Hb 9.0, plt 281k), anemia   Anesthesia Other Findings   Reproductive/Obstetrics                             Anesthesia Physical Anesthesia Plan  ASA: 3  Anesthesia Plan: MAC   Post-op Pain Management: Minimal or no pain anticipated   Induction:   PONV Risk Score and Plan: 0  Airway Management Planned: Natural Airway and Simple Face Mask  Additional Equipment: None  Intra-op Plan:   Post-operative Plan:   Informed Consent: I have reviewed the patients History and Physical, chart, labs and discussed the procedure including the risks, benefits and alternatives for the proposed anesthesia with the patient or authorized representative who has indicated his/her understanding and acceptance.     Dental advisory given  Plan Discussed  with: CRNA and Surgeon  Anesthesia Plan Comments:         Anesthesia Quick Evaluation

## 2022-10-25 ENCOUNTER — Encounter (HOSPITAL_COMMUNITY): Payer: Self-pay | Admitting: Gastroenterology

## 2022-10-25 NOTE — Discharge Summary (Signed)
Physician Discharge Summary   Marvin Thomas XBJ:[id number] DOB: 1965-10-09 DOA: 10/16/2022  PCP: Oneita Hurt, No  Admit date: 10/16/2022 Discharge date: 10/24/2022   Admitted From: Home Disposition:  Home Discharging physician: Lewie Chamber, MD Barriers to discharge: none  Recommendations at discharge: Follow up biopsy results Follow up with GI and oncology to discuss results If BP re-elevates, restart amlodipine   Discharge Condition: stable CODE STATUS: Full Diet recommendation:  Diet Orders (From admission, onward)     Start     Ordered   10/24/22 0000  Diet general        10/24/22 1649            Hospital Course: Marvin Thomas is a 57 yo male with PMH COPD, Chronic anemia, gallstone, hemothorax, GERD, PUD/perforated pyloric ulcer status post repair 2019, substance abuse (marijuana, tobacco) who presented with RUQ pain. Right upper quadrant ultrasound showed hyperechoic lesion and a single sub 4 mm low risk gallbladder polyp.  Patient also had reports of melena on admission. Further imaging studies were pursued.  MRI liver showed 4 hepatic lesions concerning for metastatic disease.  He underwent colonoscopy on 10/18/2022 noting nonobstructing large mass in the transverse colon with biopsies taken.  Biopsy results returned with tubulovillous adenoma. Prep for the colonoscopy was poor however. EGD was also performed which showed cratered duodenal ulcer and a gastric ulcer. He underwent repeat colonoscopy prep and repeat colonoscopy on 10/24/2022.  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  He was also able to undergo liver biopsy during hospitalization as well. All biopsy results were pending at time of discharge as patient was insistent on discharging home with outpatient follow-up with GI and oncology for results and further discussion.  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 Pylori also positive; treatment started 7/31 - Hgb stable, 9 g/dL at discharge  - continue protonix    Transverse colon mass Ileocecal mass - concern for liver mets on MRI; also RUL 3 cm mass with dystrophic calcification  - s/p colonoscopy on 10/18/2022.  Biopsy returned with tubulovillous adenoma - then underwent liver biopsy on 8/1 and repeat colonoscopy on 8/2 with noted ileocecal mass    AKI - resolved  - suspected prerenal on admission - creat 1.47 on admission - normalized with IVF   Bradycardia  -Asymptomatic   Hypertension:  - soft BP at times; held off on continuing amlodipine at discharge    Mild hyponatremia -Continue with IV fluids   Abnormal urinalysis pyuria -urine culture multiples species.    COPD: -PRN nebulizer.  -started Anoro Albuterol     The patient's chronic medical conditions were treated accordingly per the patient's home medication regimen except as noted.  On day of discharge, patient was felt deemed stable for discharge. Patient/family member advised to call PCP or come back to ER if needed.   Principal Diagnosis: Abdominal pain  Discharge Diagnoses: Active Hospital Problems   Diagnosis Date Noted   Abdominal pain 10/16/2022    Priority: 1.   Protein-calorie malnutrition, moderate (HCC) 10/22/2022   Bradycardia 10/16/2022   COPD (chronic obstructive pulmonary disease) (HCC) 09/05/2021   Acute on chronic anemia     Resolved Hospital Problems   Diagnosis Date Noted Date Resolved   GI bleed 10/16/2022 10/25/2022   AKI (acute  kidney injury) [facility]) 10/16/2022 10/25/2022     Discharge Instructions     Diet general   Complete by: As directed    Increase activity slowly   Complete by: As directed    No wound care   Complete by: As directed       Allergies as of 10/24/2022       Reactions   Aspirin  Nausea Only        Medication List     TAKE these medications    amoxicillin 500 MG capsule Commonly known as: AMOXIL Take 2 capsules (1,000 mg total) by mouth every 12 (twelve) hours for 8 days.   Benadryl Allergy 25 MG tablet Generic drug: diphenhydrAMINE Take 25-50 mg by mouth every 6 (six) hours as needed for sleep.   clarithromycin 500 MG tablet Commonly known as: BIAXIN Take 1 tablet (500 mg total) by mouth every 12 (twelve) hours for 8 days.   cyanocobalamin 1000 MCG tablet Take 1 tablet (1,000 mcg total) by mouth daily.   pantoprazole 40 MG tablet Commonly known as: Protonix Take 1 tablet (40 mg total) by mouth daily. What changed: medication strength   TYLENOL 500 MG tablet Generic drug: acetaminophen Take 1,000 mg by mouth every 6 (six) hours as needed for mild pain or headache.        Follow-up Information     [facility] [place]. Go on 11/06/2022.   Why: You have an appointment to establish primary care services on Thursday, 11/06/22 at 10:20am. Please arrive 15 minutes early to complete new patient paperwork. Contact information: [address] [place] [address] [phone]         [facility]. Call.   Specialty: Pulmonology Why: Call office after you are discharged from the hospital to schedule follow-up Contact information: [address] [place] [address] [phone]        Kerin Salen, MD. Schedule an appointment as soon as possible for a visit in 1 week(s).   Specialty: Gastroenterology Contact information: [address] [place] [address] [phone]         [name], MD. Schedule an appointment as soon as possible for a visit in 1 week(s).   Specialties: Hematology, Oncology Contact information: [address] [place] [address] [phone]                Allergies  Allergen Reactions   Aspirin Nausea  Only    Consultations: GI  Procedures: 7/27: EGD and colonoscopy  8/1: Right liver biopsy 8/2: Colonoscopy    Discharge Exam: BP 128/72 (BP Location: Left Arm)   Pulse (!) 51   Temp 97.6 F (36.4 C)   Resp 18   Ht 5\' 9"  (1.753 m)   Wt 53.6 kg   SpO2 100%   BMI 17.45 kg/m  Physical Exam Constitutional:      General: He is not in acute distress.    Appearance: Normal appearance.  HENT:     Head: Normocephalic and atraumatic.     Mouth/Throat:     Mouth: Mucous membranes are moist.  Eyes:     Extraocular Movements: Extraocular movements intact.  Cardiovascular:     Rate and Rhythm: Normal rate and regular rhythm.  Pulmonary:     Effort: Pulmonary effort is normal. No respiratory distress.     Breath sounds: Normal breath sounds. No wheezing.  Abdominal:     General: Bowel sounds are  normal. There is no distension.     Palpations: Abdomen is soft.     Tenderness: There is no abdominal tenderness.  Musculoskeletal:        General: Normal range of motion.     Cervical back: Normal range of motion and neck supple.  Skin:    General: Skin is warm and dry.  Neurological:     General: No focal deficit present.     Mental Status: He is alert.  Psychiatric:        Mood and Affect: Mood normal.        Behavior: Behavior normal.      The results of significant diagnostics from this hospitalization (including imaging, microbiology, ancillary and laboratory) are listed below for reference.   Microbiology: Recent Results (from the past 240 hour(s))  Urine Culture (for pregnant, neutropenic or urologic patients or patients with an indwelling urinary catheter)     Status: Abnormal   Collection Time: 10/16/22  8:43 PM   Specimen: Urine, Clean Catch  Result Value Ref Range Status   Specimen Description   Final    URINE, CLEAN CATCH Performed at [facility], [address], [place], [place] [address]    Special Requests   Final    NONE Performed at  [facility], [address], [place], [place] [address]    Culture MULTIPLE SPECIES PRESENT, SUGGEST RECOLLECTION (A)  Final   Report Status 10/18/2022 FINAL  Final     Labs: BNP (last 3 results) No results for input(s): "BNP" in the last 8760 hours. Basic Metabolic Panel: Recent Labs  Lab 10/19/22 1248 10/20/22 0539 10/21/22 0520 10/24/22 0507  NA 134* 136 134* 137  K 3.5 3.3* 3.6 4.1  CL 104 107 105 104  CO2 22 22 22 25   GLUCOSE 91 101* 89 89  BUN 17 19 22* 20  CREATININE 0.82 0.91 0.88 0.76  CALCIUM 8.3* 8.4* 8.0* 8.3*  MG 1.8 1.8  --  2.0   Liver Function Tests: Recent Labs  Lab 10/20/22 0539  AST 14*  ALT 9  ALKPHOS 64  BILITOT 0.5  PROT 5.8*  ALBUMIN 2.8*   No results for input(s): "LIPASE", "AMYLASE" in the last 168 hours. No results for input(s): "AMMONIA" in the last 168 hours. CBC: Recent Labs  Lab 10/20/22 0539 10/20/22 2346 10/21/22 0520 10/22/22 0538 10/23/22 0517 10/24/22 0507  WBC 6.3  --  5.9 6.6 6.9 6.5  NEUTROABS  --   --   --   --   --  4.5  HGB 6.9* 8.5* 8.6* 8.4* 8.6* 9.0*  HCT 22.8* 27.6* 28.3* 27.5* 28.2* 30.0*  MCV 71.7*  --  76.5* 77.0* 75.6* 78.1*  PLT 255  --  257 261 263 281   Cardiac Enzymes: No results for input(s): "CKTOTAL", "CKMB", "CKMBINDEX", "TROPONINI" in the last 168 hours. BNP: Invalid input(s): "POCBNP" CBG: No results for input(s): "GLUCAP" in the last 168 hours. D-Dimer No results for input(s): "DDIMER" in the last 72 hours. Hgb A1c No results for input(s): "HGBA1C" in the last 72 hours. Lipid Profile No results for input(s): "CHOL", "HDL", "LDLCALC", "TRIG", "CHOLHDL", "LDLDIRECT" in the last 72 hours. Thyroid function studies No results for input(s): "TSH", "T4TOTAL", "T3FREE", "THYROIDAB" in the last 72 hours.  Invalid input(s): "FREET3" Anemia work up No results for input(s): "VITAMINB12", "FOLATE", "FERRITIN", "TIBC", "IRON", "RETICCTPCT" in the last 72 hours. Urinalysis     Component Value Date/Time   COLORURINE YELLOW 10/16/2022 1637  APPEARANCEUR HAZY (A) 10/16/2022 1637   LABSPEC 1.027 10/16/2022 1637   PHURINE 5.0 10/16/2022 1637   GLUCOSEU NEGATIVE 10/16/2022 1637   HGBUR NEGATIVE 10/16/2022 1637   BILIRUBINUR NEGATIVE 10/16/2022 1637   KETONESUR 5 (A) 10/16/2022 1637   PROTEINUR 30 (A) 10/16/2022 1637   UROBILINOGEN 1.0 11/29/2014 1601   NITRITE NEGATIVE 10/16/2022 1637   LEUKOCYTESUR MODERATE (A) 10/16/2022 1637   Sepsis Labs Recent Labs  Lab 10/21/22 0520 10/22/22 0538 10/23/22 0517 10/24/22 0507  WBC 5.9 6.6 6.9 6.5   Microbiology Recent Results (from the past 240 hour(s))  Urine Culture (for pregnant, neutropenic or urologic patients or patients with an indwelling urinary catheter)     Status: Abnormal   Collection Time: 10/16/22  8:43 PM   Specimen: Urine, Clean Catch  Result Value Ref Range Status   Specimen Description   Final    URINE, CLEAN CATCH Performed at [facility], [address] [place], [place] [address]    Special Requests   Final    NONE Performed at [facility], [address] [place], [place] [address]    Culture MULTIPLE SPECIES PRESENT, SUGGEST RECOLLECTION (A)  Final   Report Status 10/18/2022 FINAL  Final    Procedures/Studies: US BIOPSY (LIVER)  Result Date: 10/23/2022 INDICATION: Multiple indeterminate liver lesions. EXAM: Ultrasound-guided biopsy of right liver mass MEDICATIONS: None. ANESTHESIA/SEDATION: Moderate (conscious) sedation was employed during this procedure. A total of Versed 2 mg and Fentanyl 100 mcg was administered intravenously by the radiology nurse. Total intra-service moderate Sedation Time: 13 minutes. The patient's level of consciousness and vital signs were monitored continuously by radiology nursing throughout the procedure under my direct supervision. COMPLICATIONS: None immediate. PROCEDURE: Informed written consent was obtained from the  patient after a thorough discussion of the procedural risks, benefits and alternatives. All questions were addressed. Maximal Sterile Barrier Technique was utilized including caps, mask, sterile gowns, sterile gloves, sterile drape, hand hygiene and skin antiseptic. A timeout was performed prior to the initiation of the procedure. Patient position supine on the ultrasound table. Right upper quadrant skin prepped and draped in usual sterile fashion. Following local lidocaine administration, 17 gauge introducer needle was advanced into the right hepatic lobe, and 4- 18 gauge cores were obtained utilizing continuous ultrasound guidance. Gelfoam slurry was administered through the introducer needle at the biopsy site. Samples were sent to pathology in formalin. Needle removed and hemostasis achieved with 5 minutes of manual compression. Post procedure ultrasound images showed no evidence of significant hemorrhage. IMPRESSION: Ultrasound-guided core needle biopsy of right liver mass. Electronically Signed   By: Acquanetta Belling M.D.   On: 10/23/2022 16:03   CT CHEST W CONTRAST  Result Date: 10/17/2022 CLINICAL DATA:  Staging hepatocellular carcinoma. * Tracking Code: BO * EXAM: CT CHEST WITH CONTRAST TECHNIQUE: Multidetector CT imaging of the chest was performed during intravenous contrast administration. RADIATION DOSE REDUCTION: This exam was performed according to the departmental dose-optimization program which includes automated exposure control, adjustment of the mA and/or kV according to patient size and/or use of iterative reconstruction technique. CONTRAST:  75mL OMNIPAQUE IOHEXOL 300 MG/ML  SOLN COMPARISON:  CT angiogram chest 09/04/2021. Abdomen and pelvis imaging 10/16/2018 FINDINGS: Cardiovascular: Thoracic aorta has a normal course and caliber with slight atherosclerotic plaque along the descending thoracic aorta. Coronary artery calcifications are also seen. Please correlate for other coronary risk  factors. The heart nonenlarged. Trace pericardial effusion. Mediastinum/Nodes: No specific abnormal lymph node enlargement seen in the  axillary region, hilum. There is an enlarged lymph node precarinal on series 2 image 65 measuring 14 x 21 mm. No other abnormal lymph node enlargement clearly seen in the mediastinum. Lungs/Pleura: Paraseptal and centrilobular emphysematous lung changes are identified. No pneumothorax, effusion or consolidation. Areas of scarring and fibrotic changes. There is a nodule along the right upper lobe medially with some calcifications and pleural thickening measuring 3.0 x 1.9 cm on series 6, image 36. This has not seen on the prior examination. Numerous location previously there is a large bulla which is no longer identified. In principle, mass lesion is not excluded. Upper Abdomen: Again mass lesion seen in the liver. Please correlate for prior workup. Musculoskeletal: Scattered degenerative changes are seen along the spine. IMPRESSION: 3 cm mass in the posterior right upper lobe with some dystrophic calcification. In addition there is an abnormal precarinal lymph node. Malignancy is possible. Recommend further workup such as PET-CT scan when clinically appropriate. 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 The previous larger bulla in the right lung apex is not seen on the current study. Coronary artery calcifications. Please correlate for other coronary risk factors. Aortic Atherosclerosis (ICD10-I70.0) and Emphysema (ICD10-J43.9). Electronically Signed   By: Karen Kays M.D.   On: 10/17/2022 15:45   MR LIVER W WO CONTRAST  Result Date: 10/17/2022 CLINICAL DATA:  57 year old male with history of multiple indeterminate liver lesions noted on prior CT examination. Follow-up study. EXAM: MRI ABDOMEN WITHOUT AND WITH CONTRAST TECHNIQUE: Multiplanar multisequence MR imaging of the abdomen was performed both before and after the administration of intravenous contrast.  CONTRAST:  6mL GADAVIST GADOBUTROL 1 MMOL/ML IV SOLN COMPARISON:  No prior abdominal MRI. CT of the abdomen and pelvis 10/16/2022. FINDINGS: Lower chest: Unremarkable. Hepatobiliary: There are 4 lesions in the liver which are low T1 signal intensity, high T2 signal intensity, demonstrates restricted diffusion, and demonstrate heterogeneous enhancement on post gadolinium imaging generally centrally hypovascular with peripheral enhancement, highly suspicious for metastatic disease. The largest of these is located in the right lobe of the liver predominantly centered in segment 8 (axial image 28 of series 10) estimated to measure approximately 5.0 x 4.1 cm, with additional lesions in segment 4A (axial image 20 of series 10), central aspect of segment 8 (axial image 21 of series 10), and inferior aspect of segment 6 (axial image 51 of series 10). A few other scattered tiny subcentimeter T1 hypointense, T2 hyperintense, nonenhancing lesions are noted in the liver, compatible with tiny cysts and/or biliary hamartomas. No intra or extrahepatic biliary ductal dilatation. Gallbladder is moderately distended. There is some dependent amorphous T1 hyperintense material which may represent some biliary sludge. Gallbladder wall does not appear thickened. No definite gallstones. Pancreas: No pancreatic mass. No pancreatic ductal dilatation. No pancreatic or peripancreatic fluid collections or inflammatory changes. Spleen:  Unremarkable. Adrenals/Urinary Tract: Bilateral kidneys and adrenal glands are normal in appearance. No hydroureteronephrosis in the visualized portions of the abdomen. Stomach/Bowel: Visualized portions are unremarkable. Vascular/Lymphatic: No aneurysm identified in the visualized abdominal vasculature. No definite lymphadenopathy noted in the abdomen. Other: No significant volume of ascites noted in the visualized portions of the peritoneal cavity. Musculoskeletal: No aggressive appearing osseous lesions are  noted in the visualized portions of the skeleton. IMPRESSION: 1. Four aggressive appearing hepatic lesions which have imaging characteristics most suggestive of metastatic disease to the liver. Further clinical evaluation to establish a source of primary malignancy is recommended. Electronically Signed   By: Brayton Mars.D.  On: 10/17/2022 05:29   CT ABDOMEN PELVIS WO CONTRAST  Result Date: 10/16/2022 CLINICAL DATA:  Abdominal pain, acute, nonlocalized EXAM: CT ABDOMEN AND PELVIS WITHOUT CONTRAST TECHNIQUE: Multidetector CT imaging of the abdomen and pelvis was performed following the standard protocol without IV contrast. RADIATION DOSE REDUCTION: This exam was performed according to the departmental dose-optimization program which includes automated exposure control, adjustment of the mA and/or kV according to patient size and/or use of iterative reconstruction technique. COMPARISON:  09/04/2021 FINDINGS: Lower chest: No acute abnormality Hepatobiliary: Subtle low-density areas seen anteriorly on image 12, centrally on image 14, and in the right hepatic lobe anteriorly on image 18. These are difficult to characterize without IV contrast. Small cyst in the hepatic dome measures 7 mm. Gallbladder unremarkable. No biliary ductal dilatation. Pancreas: No focal abnormality or ductal dilatation. Spleen: No focal abnormality.  Normal size. Adrenals/Urinary Tract: No adrenal abnormality. No focal renal abnormality. No stones or hydronephrosis. Urinary bladder is unremarkable. Stomach/Bowel: Normal appendix. Stomach, large and small bowel grossly unremarkable. Vascular/Lymphatic: Scattered aortoiliac atherosclerosis. No evidence of aneurysm or adenopathy. Reproductive: No visible focal abnormality. Other: No free fluid or free air. Musculoskeletal: No acute bony abnormality. IMPRESSION: Subtle ill-defined hypo dense areas within the liver. Differential considerations would include focal fatty infiltration or  masses. Recommend further evaluation with contrast-enhanced CT or right upper quadrant ultrasound. Aortoiliac atherosclerosis. Electronically Signed   By: Charlett Nose M.D.   On: 10/16/2022 19:23   US Abdomen Limited  Result Date: 10/16/2022 CLINICAL DATA:  RUQ pain EXAM: ULTRASOUND ABDOMEN LIMITED RIGHT UPPER QUADRANT COMPARISON:  CT scan abdomen and pelvis from 09/04/2021. FINDINGS: Gallbladder: Physiologically distended. There is a sub 4 mm sessile gallbladder polyp. No gallstones or abnormal wall thickening. No pericholecystic free fluid. No sonographic Murphy sign noted by sonographer. Common bile duct: Diameter: Less than 4 mm. Liver: No focal lesion identified. Within normal limits in parenchymal echogenicity. Liver echotexture is within normal limits. However, there is heterogeneous echogenicity with ill-defined interspersed Iso and hyperechoic areas within the technologist measured at least. 2 ill-defined hyperechoic lesions measured 4.2 x 5.0 x 5.2 cm and 1.9 x 1.9 x 2.6 cm. These are incompletely imaged and characterized on the current examination. Further evaluation with multiphasic contrast-enhanced MRI abdomen as per liver mass protocol is recommended. Portal vein is patent on color Doppler imaging with normal direction of blood flow towards the liver. Other: None. IMPRESSION: 1. No evidence of cholecystitis. 2. Heterogeneous liver with ill-defined hyperechoic lesions. Further evaluation with multiphasic contrast-enhanced MRI abdomen as per liver mass protocol is recommended. 3. There is a single sub 4 mm, low risk gallbladder polyp. Please see follow-up recommendations below. Management of Incidentally Detected Gallbladder Polyps: Society of Radiologists in Ultrasound Consensus Conference Recommendations (published online September 25, 2020): [email or web address] Extremely low-risk polyps, i.e. pedunculated ball-on-the-wall or thin stalk: <9 mm: no follow-up 10-14 mm: follow-up at 6, 12  and 24 months >15 mm: surgical consult Low-risk polyps, i.e. pedunculated with a thick or wide stalk, or sessile: ?6 mm: no follow-up 7-9 mm follow-up ultrasound at 12 months 10-14 mm: follow-up ultrasound at 6, 12, 24, and 36 months vs surgical consult >15 mm: surgical consult Intermediate risk: focal wall-thickening >47mm adjacent to polyp: <6 mm: follow-up at 6, 12, 24, 36 months vs surgical consult >7 mm: surgical consult Electronically Signed   By: Jules Schick M.D.   On: 10/16/2022 15:32     Time coordinating discharge: Over 30 minutes    Lewie Chamber, MD  [facility] 10/25/2022, 3:57 PM

## 2022-10-27 NOTE — Progress Notes (Unsigned)
I called Marvin Thomas to review upcoming appt with [name] and Dr Mosetta Putt on 8/82024.  Pt did not answer.  I was unable to leave message as his mailbox was full.

## 2022-10-28 NOTE — Progress Notes (Signed)
  I called Mr Sancho to review upcoming appt with [name] and Dr Mosetta Putt on 8/82024.  Pt did not answer.  I was unable to leave message as his mailbox was full.

## 2022-10-29 ENCOUNTER — Emergency Department (HOSPITAL_COMMUNITY): Payer: Medicaid Other

## 2022-10-29 ENCOUNTER — Encounter (HOSPITAL_COMMUNITY): Payer: Self-pay

## 2022-10-29 ENCOUNTER — Other Ambulatory Visit: Payer: Self-pay

## 2022-10-29 ENCOUNTER — Emergency Department (HOSPITAL_COMMUNITY)
Admission: EM | Admit: 2022-10-29 | Discharge: 2022-10-29 | Disposition: A | Discharge status: Home / Self Care | Payer: Medicaid Other | Attending: Emergency Medicine | Admitting: Emergency Medicine

## 2022-10-29 DIAGNOSIS — R1010 Upper abdominal pain, unspecified: Secondary | ICD-10-CM

## 2022-10-29 DIAGNOSIS — D509 Iron deficiency anemia, unspecified: Secondary | ICD-10-CM | POA: Diagnosis not present

## 2022-10-29 DIAGNOSIS — R1013 Epigastric pain: Secondary | ICD-10-CM | POA: Diagnosis present

## 2022-10-29 DIAGNOSIS — C801 Malignant (primary) neoplasm, unspecified: Secondary | ICD-10-CM | POA: Diagnosis not present

## 2022-10-29 DIAGNOSIS — C787 Secondary malignant neoplasm of liver and intrahepatic bile duct: Secondary | ICD-10-CM | POA: Insufficient documentation

## 2022-10-29 LAB — COMPREHENSIVE METABOLIC PANEL
ALT: 22 U/L (ref 0–44)
AST: 16 U/L (ref 15–41)
Albumin: 3.2 g/dL — ABNORMAL LOW (ref 3.5–5.0)
Alkaline Phosphatase: 89 U/L (ref 38–126)
Anion gap: 6 (ref 5–15)
BUN: 26 mg/dL — ABNORMAL HIGH (ref 6–20)
CO2: 25 mmol/L (ref 22–32)
Calcium: 8.8 mg/dL — ABNORMAL LOW (ref 8.9–10.3)
Chloride: 107 mmol/L (ref 98–111)
Creatinine, Ser: 0.87 mg/dL (ref 0.61–1.24)
GFR, Estimated: >60 mL/min (ref >60)
Glucose, Bld: 97 mg/dL (ref 70–99)
Potassium: 3.7 mmol/L (ref 3.5–5.1)
Sodium: 138 mmol/L (ref 135–145)
Total Bilirubin: 0.2 mg/dL — ABNORMAL LOW (ref 0.3–1.2)
Total Protein: 6.7 g/dL (ref 6.5–8.1)

## 2022-10-29 LAB — I-STAT CG4 LACTIC ACID, ED: Lactic Acid, Venous: 1 mmol/L (ref 0.5–1.9)

## 2022-10-29 LAB — CBC WITH DIFFERENTIAL/PLATELET
Abs Immature Granulocytes: 0.02 10*3/uL (ref 0.00–0.07)
Basophils Absolute: 0 10*3/uL (ref 0.0–0.1)
Basophils Relative: 1 %
Eosinophils Absolute: 0.1 10*3/uL (ref 0.0–0.5)
Eosinophils Relative: 1 %
HCT: 30.8 % — ABNORMAL LOW (ref 39.0–52.0)
Hemoglobin: 9.1 g/dL — ABNORMAL LOW (ref 13.0–17.0)
Immature Granulocytes: 0 %
Lymphocytes Relative: 20 %
Lymphs Abs: 1.3 10*3/uL (ref 0.7–4.0)
MCH: 23.3 pg — ABNORMAL LOW (ref 26.0–34.0)
MCHC: 29.5 g/dL — ABNORMAL LOW (ref 30.0–36.0)
MCV: 78.8 fL — ABNORMAL LOW (ref 80.0–100.0)
Monocytes Absolute: 0.5 10*3/uL (ref 0.1–1.0)
Monocytes Relative: 9 %
Neutro Abs: 4.5 10*3/uL (ref 1.7–7.7)
Neutrophils Relative %: 69 %
Platelets: 388 10*3/uL (ref 150–400)
RBC: 3.91 MIL/uL — ABNORMAL LOW (ref 4.22–5.81)
RDW: 21.8 % — ABNORMAL HIGH (ref 11.5–15.5)
WBC: 6.4 10*3/uL (ref 4.0–10.5)
nRBC: 0 % (ref 0.0–0.2)

## 2022-10-29 LAB — LIPASE, BLOOD: Lipase: 40 U/L (ref 11–51)

## 2022-10-29 MED ORDER — PROCHLORPERAZINE MALEATE 10 MG PO TABS: 10.0000 mg | ORAL_TABLET | Freq: Four times a day (QID) | ORAL | 0 refills | Status: DC | PRN

## 2022-10-29 MED ORDER — SODIUM CHLORIDE (PF) 0.9 % IJ SOLN
INTRAMUSCULAR | Status: AC
  Filled 2022-10-29: qty 50

## 2022-10-29 MED ORDER — PROCHLORPERAZINE EDISYLATE 10 MG/2ML IJ SOLN
10.0000 mg | Freq: Once | INTRAMUSCULAR | Status: AC
  Administered 2022-10-29: 10 mg via INTRAVENOUS
  Filled 2022-10-29: qty 2

## 2022-10-29 MED ORDER — ONDANSETRON HCL 4 MG/2ML IJ SOLN
4.0000 mg | Freq: Once | INTRAMUSCULAR | Status: AC
  Administered 2022-10-29: 4 mg via INTRAVENOUS
  Filled 2022-10-29: qty 2

## 2022-10-29 MED ORDER — OXYCODONE HCL 5 MG PO TABS
5.0000 mg | ORAL_TABLET | ORAL | 0 refills | Status: DC | PRN
Start: 2022-10-29 — End: 2022-12-02

## 2022-10-29 MED ORDER — PANTOPRAZOLE SODIUM 40 MG PO TBEC: 40.0000 mg | DELAYED_RELEASE_TABLET | Freq: Every day | ORAL | 0 refills | Status: DC

## 2022-10-29 MED ORDER — PANTOPRAZOLE SODIUM 40 MG IV SOLR
40.0000 mg | Freq: Once | INTRAVENOUS | Status: AC
  Administered 2022-10-29: 40 mg via INTRAVENOUS
  Filled 2022-10-29: qty 10

## 2022-10-29 MED ORDER — MORPHINE SULFATE (PF) 4 MG/ML IV SOLN
4.0000 mg | Freq: Once | INTRAVENOUS | Status: AC
  Administered 2022-10-29: 4 mg via INTRAVENOUS
  Filled 2022-10-29: qty 1

## 2022-10-29 MED ORDER — IOHEXOL 300 MG/ML  SOLN
80.0000 mL | Freq: Once | INTRAMUSCULAR | Status: AC | PRN
  Administered 2022-10-29: 80 mL via INTRAVENOUS

## 2022-10-29 NOTE — Discharge Instructions (Addendum)
Please follow-up with the oncology service as well as your gastroenterologist.  Return to the emergency department if pain or nausea not being adequately controlled at home.

## 2022-10-29 NOTE — ED Triage Notes (Signed)
Arrives [facility] from home with periumbilical abdominal pain x 3 days.   Nausea but denies vomiting.   Says he was recently seen for same and had colonoscopy but unsure of the results.

## 2022-10-29 NOTE — ED Provider Notes (Signed)
[facility]ERGENCY DEPARTMENT AT [facility] Provider Note   CSN: [id number] Arrival date & time: 10/29/22  0122     History  Chief Complaint  Patient presents with   Abdominal Pain    Marvin Thomas is a 57 y.o. male.  The history is provided by the patient.  Abdominal Pain He has history of COPD, GERD, perforated ulcer and comes in because of epigastric pain for the last 2 days.  Pain does not radiate.  There is associated nausea but no vomiting.  He had been having similar pain for several months, had recently been hospitalized and had upper and lower endoscopies done.  He was not having pain when he left the hospital, pain started back again 2 days ago.  Nothing makes it better, nothing makes it worse.   Home Medications Prior to Admission medications   Medication Sig Start Date End Date Taking? Authorizing Provider  acetaminophen (TYLENOL) 500 MG tablet Take 1,000 mg by mouth every 6 (six) hours as needed for mild pain or headache.    [provider]  amoxicillin (AMOXIL) 500 MG capsule Take 2 capsules (1,000 mg total) by mouth every 12 (twelve) hours for 8 days. 10/24/22 11/01/22  Lewie Chamber, MD  BENADRYL ALLERGY 25 MG tablet Take 25-50 mg by mouth every 6 (six) hours as needed for sleep.    [provider]  clarithromycin (BIAXIN) 500 MG tablet Take 1 tablet (500 mg total) by mouth every 12 (twelve) hours for 8 days. 10/24/22 11/01/22  Lewie Chamber, MD  cyanocobalamin 1000 MCG tablet Take 1 tablet (1,000 mcg total) by mouth daily. 10/25/22   Lewie Chamber, MD  pantoprazole (PROTONIX) 40 MG tablet Take 1 tablet (40 mg total) by mouth daily. 10/24/22   Lewie Chamber, MD      Allergies    Aspirin    Review of Systems   Review of Systems  Gastrointestinal:  Positive for abdominal pain.  All other systems reviewed and are negative.   Physical Exam Updated Vital Signs BP (!) 143/78   Pulse 64   Temp 98.1 F (36.7 C) (Oral)   Resp 20   Ht  5\' 9"  (1.753 m)   Wt 54.4 kg   SpO2 100%   BMI 17.72 kg/m  Physical Exam Vitals and nursing note reviewed.   57 year old male, appears uncomfortable and is rocking back and forth in obvious pain, but he is in no acute distress. Vital signs are significant for borderline elevated blood pressure. Oxygen saturation is 100%, which is normal. Head is normocephalic and atraumatic. PERRLA, EOMI. Oropharynx is clear. Neck is nontender and supple. Back is nontender and there is no CVA tenderness. Lungs are clear without rales, wheezes, or rhonchi. Chest is nontender. Heart has regular rate and rhythm without murmur. Abdomen is soft, flat, with moderate epigastric tenderness.  There is no rebound or guarding. Extremities have no cyanosis or edema, full range of motion is present. Skin is warm and dry without rash. Neurologic: Mental status is normal, cranial nerves are intact, moves all extremities equally.  ED Results / Procedures / Treatments   Labs (all labs ordered are listed, but only abnormal results are displayed) Labs Reviewed  COMPREHENSIVE METABOLIC PANEL - Abnormal; Notable for the following components:      Result Value   BUN 26 (*)    Calcium 8.8 (*)    Albumin 3.2 (*)    Total Bilirubin 0.2 (*)    All other components  within normal limits  CBC WITH DIFFERENTIAL/PLATELET - Abnormal; Notable for the following components:   RBC 3.91 (*)    Hemoglobin 9.1 (*)    HCT 30.8 (*)    MCV 78.8 (*)    MCH 23.3 (*)    MCHC 29.5 (*)    RDW 21.8 (*)    All other components within normal limits  LIPASE, BLOOD  I-STAT CG4 LACTIC ACID, ED   Radiology CT ABDOMEN PELVIS W CONTRAST  Result Date: 10/29/2022 CLINICAL DATA:  Left upper quadrant pain. 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 EXAM: CT ABDOMEN AND PELVIS WITH CONTRAST TECHNIQUE: Multidetector CT imaging of the abdomen and pelvis was performed  using the standard protocol following bolus administration of intravenous contrast. RADIATION DOSE REDUCTION: This exam was performed according to the departmental dose-optimization program which includes automated exposure control, adjustment of the mA and/or kV according to patient size and/or use of iterative reconstruction technique. CONTRAST:  80mL OMNIPAQUE IOHEXOL 300 MG/ML  SOLN COMPARISON:  CT without contrast 10/16/2022, MRI without and with contrast 10/16/2022, CT with IV contrast 09/04/2021. FINDINGS: Lower chest: No lung base nodules or infiltrates. COPD change. The cardiac size is normal. Hepatobiliary: Metastatic liver masses are again noted. The largest 2 both of segment 8, the more lateral of the 2 lesions measuring 5.1 cm transverse on 2:17, previously 4.3 cm. Medial to this the other segment 8 lesion is 4.4 cm, previously 4.5 cm. More cephalad in segment 4A, a third metastasis measures 3 cm on 2:13, stable. A fourth metastasis inferiorly in segment 6 measures 2.6 cm, previously 2.6 cm. Small scattered cysts in the hepatic dome are also again noted but no new metastasis is seen. The gallbladder and bile ducts are unremarkable. Pancreas: There is upper abdominal mesenteric edema which could be congestive, due to malnutrition or hepatic dysfunction. Some of this is seen adjacent to the pancreas. It is possible the patient could have pancreatitis but no peripancreatic fluid is seen or ductal dilatation. There is no mass enhancement. Correlate with serum lipase. Spleen: No abnormality. Adrenals/Urinary Tract: Adrenal glands are unremarkable. Kidneys are normal, without renal calculi, focal lesion, or hydronephrosis. Bladder is unremarkable. Stomach/Bowel: Diffuse thickened folds in the stomach are chronically seen. The small intestine is normal in caliber. The appendix is normal in caliber and best seen on the coronal reformatting. Moderate fecal stasis. In the mid transverse colon just left of the  midline there is a rounded masslike wall abnormality measuring 3.3 x 3.2 cm on 2:49, 2.6 cm in height on 7:35, and filling most of the luminal volume, which is suspected to be a primary tumor. There is scattered colonic diverticulosis without evidence of diverticulitis or colitis. Vascular/Lymphatic: Aortic atherosclerosis. No enlarged abdominal or pelvic lymph nodes. Reproductive: Prostate is unremarkable. Other: Minimal pelvic ascites. No free air. Hazy edema persists throughout the mesentery but especially in the abdomen. Trace subhepatic ascites. Musculoskeletal: Degenerative change thoracic and lumbar spine, lumbar spondylosis and multilevel endplate Schmorl's nodes. No aggressive osseous lesion is seen. IMPRESSION: 1. 3.3 x 3.2 x 2.6 cm rounded masslike wall abnormality in the mid transverse colon just left of the midline, suspected to be a primary tumor. This fills most of luminal volume but there is no evidence of an upstream mechanical obstruction. 2. Four metastatic liver masses are again noted, with the largest lesion in segment 8 measuring 5.1 cm, previously 4.3 cm. 3. Mesenteric edema which could be congestive, due to malnutrition or  hepatic dysfunction. Some of this is seen adjacent to the pancreas. It is possible the patient could have pancreatitis but no peripancreatic fluid or ductal dilatation is seen. Correlate with serum lipase. 4. Diverticulosis and moderate fecal stasis. 5. Aortic atherosclerosis. Aortic Atherosclerosis (ICD10-I70.0). Electronically Signed   By: Almira Bar M.D.   On: 10/29/2022 05:02    Procedures Procedures    Medications Ordered in ED Medications  ondansetron (ZOFRAN) injection 4 mg (4 mg Intravenous Given 10/29/22 0243)  morphine (PF) 4 MG/ML injection 4 mg (4 mg Intravenous Given 10/29/22 0244)  pantoprazole (PROTONIX) injection 40 mg (40 mg Intravenous Given 10/29/22 0243)  iohexol (OMNIPAQUE) 300 MG/ML solution 80 mL (80 mLs Intravenous Contrast Given 10/29/22  0353)  prochlorperazine (COMPAZINE) injection 10 mg (10 mg Intravenous Given 10/29/22 0446)    ED Course/ Medical Decision Making/ A&P                                 Medical Decision Making Amount and/or Complexity of Data Reviewed Labs: ordered. Radiology: ordered.  Risk Prescription drug management.   Epigastric pain.  Differential diagnosis includes, but is not limited to, peptic ulcer disease, GERD, pancreatitis, cholecystitis, diverticulitis.  This is a differential which includes conditions with significant risk of morbidity and complications.  I have reviewed his past records, and he was admitted from 10/16/2022-10/24/2022 for evaluation of a liver mass.  Colonoscopy showed lesion in the cecum concerning for cancer, upper endoscopy showed ulcers.  Liver biopsy showed metastatic cancer likely of GI origin.  Colon biopsy results are pending.  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  I have ordered laboratory workup of CBC, comprehensive metabolic panel, lipase, lactic acid level.  I have ordered CT of abdomen and pelvis.  I have ordered morphine for pain and ondansetron for nausea.  He had good relief of pain with morphine but did require additional antiemetics.  I ordered a dose of prochlorperazine.  Following this, he is resting quietly.  I reviewed and interpreted his laboratory tests, and my interpretation is normal lactic acid level, normal lipase, mild hypoalbuminemia, mildly elevated BUN of uncertain clinical significance, stable anemia.  CT scan shows a mass in the transverse colon suspicious for primary carcinoma and stable liver metastases.  Have independently viewed the images, and agree with the radiologist's interpretation.  Since he had good relief of pain, I feel he is safe for discharge and outpatient management of his cancer.  I am referring him back to his gastroenterologist and also to the cancer center.  I am discharging him with prescriptions for  pantoprazole, prochlorperazine, oxycodone.  Advised to return if pain is not being adequately controlled at home.  Final Clinical Impression(s) / ED Diagnoses Final diagnoses:  Upper abdominal pain  Microcytic anemia  Metastatic cancer to liver [facility])    Rx / DC Orders ED Discharge Orders          Ordered    Ambulatory referral to Hematology / Oncology       Comments: Your emergency department provider has referred you to see a hematology/oncology specialist. These are physicians who specialize in blood disorders and cancers, or findings concerning for cancer. You will receive a phone call from the [facility] Office to set up your appointment within 2 business days: [employer] operate Mon - Fri, 8:00 a.m. to 5:00 p.m.; closed for federally recognized holidays. Please be sure your phone is not set  to block numbers during this time.   10/29/22 0738    oxyCODONE (ROXICODONE) 5 MG immediate release tablet  Every 4 hours PRN        10/29/22 0738    pantoprazole (PROTONIX) 40 MG tablet  Daily        10/29/22 0738    prochlorperazine (COMPAZINE) 10 MG tablet  Every 6 hours PRN        10/29/22 0738              Dione Booze, MD 10/29/22 [phone]

## 2022-10-29 NOTE — ED Notes (Signed)
Assumed care patient found on stretcher with eyes closed,easy to arouse,  no acute distress noted.Updated on plan of care. Awaiting further order/ disposition. Offers no complaints at this time.

## 2022-10-30 ENCOUNTER — Inpatient Hospital Stay: Payer: Self-pay | Admitting: Nurse Practitioner

## 2022-10-30 NOTE — Progress Notes (Deleted)
[facility]   [phone] [phone]) [phone]   Clinic New Consult Note   Patient Care Team: Pcp, No as PCP - General Patient, No Pcp Per (General Practice) 10/30/2022  CHIEF COMPLAINTS/PURPOSE OF CONSULTATION:  Colon cancer, referred by ED physician Dr. Preston Thomas  HISTORY OF PRESENTING ILLNESS:  Marvin Thomas 57 y.o. male with PMH including COPD, anemia, GERD, PUD is here because of newly diagnosed colon cancer. He presented to ED 10/16/22 with post-prandial RUQ pain. Found to have acute on chronic anemia, Hgb 8.9 (10.3 - 12.1 in 08/2021) and AKI. FOBT +. Abdominal US and CT AP wo contrast showed hyperechoic liver lesions. ABD MRI showed 4 lesions with imaging characteristics of metastatic disease, largest measuring 5 x 4.1 cm. GI work up by Dr. Levora Thomas with EGD showed gastric and duodenal ulcer and colonoscopy showed a polypoid non obstructing mass in the transverse colon. H.pylori positive. Transverse colon mass initially showed tubulovillous adenoma. Liver biopsy 10/23/22 confirmed metastatic adenocarcinoma c/w colonic primary. Repeat colonoscopy with better prep 10/23/22 confirmed. Repeat colonoscopy with better prep 8/2 by Dr. Marca Thomas showed an infiltrative polypoid and ulcerated partially obstructing mass at the ileocecal valve and the known mass in the transverse colon at 60 cm. Path of ileocecal valve mass showed benign mucosa with lymphoid aggregate, transverse colon polypectomy showed TA, and another polypectomy from the sigmoid colon showed TA all negative for high grade dysplasia or malignancy. Baseline CEA normal, 3.0. IDA (ferritin 7, serum iron 23, 7% saturation, and TIBC 336) was treated with ferrlecit (250 mg x1). He was discharged on 8/2. Presented back to ED for worsening pain 10/29/22, CT with stable findings.   MEDICAL HISTORY:  Past Medical History:  Diagnosis Date   Anemia Dx 2014   Gallstones    Hemothorax    Ulcer     SURGICAL HISTORY: Past  Surgical History:  Procedure Laterality Date   BIOPSY  10/18/2022   Procedure: BIOPSY;  Surgeon: Marvin Der, MD;  Location: [facility];  Service: Gastroenterology;;   BIOPSY  10/24/2022   Procedure: BIOPSY;  Surgeon: Kerin Salen, MD;  Location: [facility];  Service: Gastroenterology;;   COLONOSCOPY WITH PROPOFOL N/A 10/18/2022   Procedure: COLONOSCOPY WITH PROPOFOL;  Surgeon: Marvin Der, MD;  Location: [facility];  Service: Gastroenterology;  Laterality: N/A;   COLONOSCOPY WITH PROPOFOL N/A 10/24/2022   Procedure: COLONOSCOPY WITH PROPOFOL;  Surgeon: Kerin Salen, MD;  Location: [facility];  Service: Gastroenterology;  Laterality: N/A;   ESOPHAGOGASTRODUODENOSCOPY (EGD) WITH PROPOFOL N/A 10/18/2022   Procedure: ESOPHAGOGASTRODUODENOSCOPY (EGD) WITH PROPOFOL;  Surgeon: Marvin Der, MD;  Location: [facility];  Service: Gastroenterology;  Laterality: N/A;   LAPAROTOMY N/A 12/09/2017   Procedure: EXPLORATORY LAPAROTOMY, REPAIR PERFORATED PYLEORIC ULCER;  Surgeon: Marvin Lint, MD;  Location: [facility];  Service: General;  Laterality: N/A;   POLYPECTOMY  10/24/2022   Procedure: POLYPECTOMY;  Surgeon: Kerin Salen, MD;  Location: [facility];  Service: Gastroenterology;;   SUBMUCOSAL TATTOO INJECTION  10/18/2022   Procedure: SUBMUCOSAL TATTOO INJECTION;  Surgeon: Marvin Der, MD;  Location: [facility];  Service: Gastroenterology;;    SOCIAL HISTORY: Social History   Socioeconomic History   Marital status: Single    Spouse name: Not on file   Number of children: 1    Years of education: 12    Highest education level: Not on file  Occupational History   Occupation: Self-Employed    Comment: Landscaping and Financial trader   Tobacco Use   Smoking status: Every Day  Current packs/day: 1.50    Average packs/day: 1.5 packs/day for 31.0 years (46.5 ttl pk-yrs)    Types: Cigarettes   Smokeless tobacco: Current  Vaping Use   Vaping status: Every Day  Substance and  Sexual Activity   Alcohol use: Yes    Comment: quit 1980's    Drug use: Yes    Types: Marijuana    Comment: Patient reports using last night   Sexual activity: Yes    Birth control/protection: Condom  Other Topics Concern   Not on file  Social History Narrative   ** Merged History Encounter **       Lives with mother Marvin Thomas  Has 70 yo son.  Lives with his mother and grandmother.    Social Determinants of Health   Financial Resource Strain: Not on File (07/11/2021)   Received from [facility], [place] Strain    Financial Resource Strain: 0  Food Insecurity: No Food Insecurity (10/16/2022)   Hunger Vital Sign    Worried About Running Out of Food in the Last Year: Never true    Ran Out of Food in the Last Year: Never true  Transportation Needs: No Transportation Needs (10/16/2022)   PRAPARE - Administrator, Civil Service (Medical): No    Lack of Transportation (Non-Medical): No  Physical Activity: Not on File (07/11/2021)   Received from [place], [place]   Physical Activity    Physical Activity: 0  Stress: Not on File (07/11/2021)   Received from [facility], [place]   Stress    Stress: 0  Social Connections: Not on File (07/11/2021)   Received from [place], [place]   Social Connections    Social Connections and Isolation: 0  Intimate Partner Violence: Not At Risk (10/16/2022)   Humiliation, Afraid, Rape, and Kick questionnaire    Fear of Current or Ex-Partner: No    Emotionally Abused: No    Physically Abused: No    Sexually Abused: No    FAMILY HISTORY: Family History  Problem Relation Age of Onset   Diabetes Mother    Hypertension Mother    Heart disease Mother        x 3    Heart disease Brother    Hypertension Brother    Diabetes Other    Hypertension Brother    Hypertension Brother    Cancer Neg Hx     ALLERGIES:  is allergic to aspirin.  MEDICATIONS:  Current Outpatient Medications  Medication Sig Dispense Refill   acetaminophen  (TYLENOL) 500 MG tablet Take 1,000 mg by mouth every 6 (six) hours as needed for mild pain or headache.     amoxicillin (AMOXIL) 500 MG capsule Take 2 capsules (1,000 mg total) by mouth every 12 (twelve) hours for 8 days. 32 capsule 0   BENADRYL ALLERGY 25 MG tablet Take 25-50 mg by mouth every 6 (six) hours as needed for sleep.     clarithromycin (BIAXIN) 500 MG tablet Take 1 tablet (500 mg total) by mouth every 12 (twelve) hours for 8 days. 16 tablet 0   cyanocobalamin 1000 MCG tablet Take 1 tablet (1,000 mcg total) by mouth daily.     oxyCODONE (ROXICODONE) 5 MG immediate release tablet Take 1 tablet (5 mg total) by mouth every 4 (four) hours as needed for severe pain. 30 tablet 0   pantoprazole (PROTONIX) 40 MG tablet Take 1 tablet (40 mg total) by mouth daily. 30 tablet 3   pantoprazole (PROTONIX) 40 MG tablet Take  1 tablet (40 mg total) by mouth daily. 90 tablet 0   prochlorperazine (COMPAZINE) 10 MG tablet Take 1 tablet (10 mg total) by mouth every 6 (six) hours as needed for nausea or vomiting. 20 tablet 0   No current facility-administered medications for this visit.    REVIEW OF SYSTEMS:   Constitutional: Denies fevers, chills or abnormal night sweats Eyes: Denies blurriness of vision, double vision or watery eyes Ears, nose, mouth, throat, and face: Denies mucositis or sore throat Respiratory: Denies cough, dyspnea or wheezes Cardiovascular: Denies palpitation, chest discomfort or lower extremity swelling Gastrointestinal:  Denies nausea, heartburn or change in bowel habits Skin: Denies abnormal skin rashes Lymphatics: Denies new lymphadenopathy or easy bruising Neurological:Denies numbness, tingling or new weaknesses Behavioral/Psych: Mood is stable, no new changes  All other systems were reviewed with the patient and are negative.  PHYSICAL EXAMINATION: ECOG PERFORMANCE STATUS: {CHL ONC ECOG PS:[id number]}  There were no vitals filed for this visit. There were no vitals  filed for this visit.  GENERAL:alert, no distress and comfortable SKIN: skin color, texture, turgor are normal, no rashes or significant lesions EYES: normal, conjunctiva are pink and non-injected, sclera clear OROPHARYNX:no exudate, no erythema and lips, buccal mucosa, and tongue normal  NECK: supple, thyroid normal size, non-tender, without nodularity LYMPH:  no palpable lymphadenopathy in the cervical, axillary or inguinal LUNGS: clear to auscultation and percussion with normal breathing effort HEART: regular rate & rhythm and no murmurs and no lower extremity edema ABDOMEN:abdomen soft, non-tender and normal bowel sounds Musculoskeletal:no cyanosis of digits and no clubbing  PSYCH: alert & oriented x 3 with fluent speech NEURO: no focal motor/sensory deficits  LABORATORY DATA:  I have reviewed the data as listed    Latest Ref Rng & Units 10/29/2022    2:00 AM 10/24/2022    5:07 AM 10/23/2022    5:17 AM  CBC  WBC 4.0 - 10.5 K/uL 6.4  6.5  6.9   Hemoglobin 13.0 - 17.0 g/dL 9.1  9.0  8.6   Hematocrit 39.0 - 52.0 % 30.8  30.0  28.2   Platelets 150 - 400 K/uL 388  281  263     @cmpl @  RADIOGRAPHIC STUDIES: I have personally reviewed the radiological images as listed and agreed with the findings in the report. CT ABDOMEN PELVIS W CONTRAST  Result Date: 10/29/2022 CLINICAL DATA:  Left upper quadrant pain. 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 EXAM: CT ABDOMEN AND PELVIS WITH CONTRAST TECHNIQUE: Multidetector CT imaging of the abdomen and pelvis was performed using the standard protocol following bolus administration of intravenous contrast. RADIATION DOSE REDUCTION: This exam was performed according to the departmental dose-optimization program which includes automated exposure control, adjustment of the mA and/or kV according to patient size and/or use of iterative reconstruction technique. CONTRAST:  80mL  OMNIPAQUE IOHEXOL 300 MG/ML  SOLN COMPARISON:  CT without contrast 10/16/2022, MRI without and with contrast 10/16/2022, CT with IV contrast 09/04/2021. FINDINGS: Lower chest: No lung base nodules or infiltrates. COPD change. The cardiac size is normal. Hepatobiliary: Metastatic liver masses are again noted. The largest 2 both of segment 8, the more lateral of the 2 lesions measuring 5.1 cm transverse on 2:17, previously 4.3 cm. Medial to this the other segment 8 lesion is 4.4 cm, previously 4.5 cm. More cephalad in segment 4A, a third metastasis measures 3 cm on 2:13, stable. A fourth metastasis inferiorly in segment 6 measures 2.6 cm, previously 2.6  cm. Small scattered cysts in the hepatic dome are also again noted but no new metastasis is seen. The gallbladder and bile ducts are unremarkable. Pancreas: There is upper abdominal mesenteric edema which could be congestive, due to malnutrition or hepatic dysfunction. Some of this is seen adjacent to the pancreas. It is possible the patient could have pancreatitis but no peripancreatic fluid is seen or ductal dilatation. There is no mass enhancement. Correlate with serum lipase. Spleen: No abnormality. Adrenals/Urinary Tract: Adrenal glands are unremarkable. Kidneys are normal, without renal calculi, focal lesion, or hydronephrosis. Bladder is unremarkable. Stomach/Bowel: Diffuse thickened folds in the stomach are chronically seen. The small intestine is normal in caliber. The appendix is normal in caliber and best seen on the coronal reformatting. Moderate fecal stasis. In the mid transverse colon just left of the midline there is a rounded masslike wall abnormality measuring 3.3 x 3.2 cm on 2:49, 2.6 cm in height on 7:35, and filling most of the luminal volume, which is suspected to be a primary tumor. There is scattered colonic diverticulosis without evidence of diverticulitis or colitis. Vascular/Lymphatic: Aortic atherosclerosis. No enlarged abdominal or pelvic  lymph nodes. Reproductive: Prostate is unremarkable. Other: Minimal pelvic ascites. No free air. Hazy edema persists throughout the mesentery but especially in the abdomen. Trace subhepatic ascites. Musculoskeletal: Degenerative change thoracic and lumbar spine, lumbar spondylosis and multilevel endplate Schmorl's nodes. No aggressive osseous lesion is seen. IMPRESSION: 1. 3.3 x 3.2 x 2.6 cm rounded masslike wall abnormality in the mid transverse colon just left of the midline, suspected to be a primary tumor. This fills most of luminal volume but there is no evidence of an upstream mechanical obstruction. 2. Four metastatic liver masses are again noted, with the largest lesion in segment 8 measuring 5.1 cm, previously 4.3 cm. 3. Mesenteric edema which could be congestive, due to malnutrition or hepatic dysfunction. Some of this is seen adjacent to the pancreas. It is possible the patient could have pancreatitis but no peripancreatic fluid or ductal dilatation is seen. Correlate with serum lipase. 4. Diverticulosis and moderate fecal stasis. 5. Aortic atherosclerosis. Aortic Atherosclerosis (ICD10-I70.0). Electronically Signed   By: Almira Bar M.D.   On: 10/29/2022 05:02   US BIOPSY (LIVER)  Result Date: 10/23/2022 INDICATION: Multiple indeterminate liver lesions. EXAM: Ultrasound-guided biopsy of right liver mass MEDICATIONS: None. ANESTHESIA/SEDATION: Moderate (conscious) sedation was employed during this procedure. A total of Versed 2 mg and Fentanyl 100 mcg was administered intravenously by the radiology nurse. Total intra-service moderate Sedation Time: 13 minutes. The patient's level of consciousness and vital signs were monitored continuously by radiology nursing throughout the procedure under my direct supervision. COMPLICATIONS: None immediate. PROCEDURE: Informed written consent was obtained from the patient after a thorough discussion of the procedural risks, benefits and alternatives. All  questions were addressed. Maximal Sterile Barrier Technique was utilized including caps, mask, sterile gowns, sterile gloves, sterile drape, hand hygiene and skin antiseptic. A timeout was performed prior to the initiation of the procedure. Patient position supine on the ultrasound table. Right upper quadrant skin prepped and draped in usual sterile fashion. Following local lidocaine administration, 17 gauge introducer needle was advanced into the right hepatic lobe, and 4- 18 gauge cores were obtained utilizing continuous ultrasound guidance. Gelfoam slurry was administered through the introducer needle at the biopsy site. Samples were sent to pathology in formalin. Needle removed and hemostasis achieved with 5 minutes of manual compression. Post procedure ultrasound images showed no evidence of significant hemorrhage. IMPRESSION: Ultrasound-guided  core needle biopsy of right liver mass. Electronically Signed   By: Acquanetta Belling M.D.   On: 10/23/2022 16:03   CT CHEST W CONTRAST  Result Date: 10/17/2022 CLINICAL DATA:  Staging hepatocellular carcinoma. * Tracking Code: BO * EXAM: CT CHEST WITH CONTRAST TECHNIQUE: Multidetector CT imaging of the chest was performed during intravenous contrast administration. RADIATION DOSE REDUCTION: This exam was performed according to the departmental dose-optimization program which includes automated exposure control, adjustment of the mA and/or kV according to patient size and/or use of iterative reconstruction technique. CONTRAST:  75mL OMNIPAQUE IOHEXOL 300 MG/ML  SOLN COMPARISON:  CT angiogram chest 09/04/2021. Abdomen and pelvis imaging 10/16/2018 FINDINGS: Cardiovascular: Thoracic aorta has a normal course and caliber with slight atherosclerotic plaque along the descending thoracic aorta. Coronary artery calcifications are also seen. Please correlate for other coronary risk factors. The heart nonenlarged. Trace pericardial effusion. Mediastinum/Nodes: No specific abnormal  lymph node enlargement seen in the axillary region, hilum. There is an enlarged lymph node precarinal on series 2 image 65 measuring 14 x 21 mm. No other abnormal lymph node enlargement clearly seen in the mediastinum. Lungs/Pleura: Paraseptal and centrilobular emphysematous lung changes are identified. No pneumothorax, effusion or consolidation. Areas of scarring and fibrotic changes. There is a nodule along the right upper lobe medially with some calcifications and pleural thickening measuring 3.0 x 1.9 cm on series 6, image 36. This has not seen on the prior examination. Numerous location previously there is a large bulla which is no longer identified. In principle, mass lesion is not excluded. Upper Abdomen: Again mass lesion seen in the liver. Please correlate for prior workup. Musculoskeletal: Scattered degenerative changes are seen along the spine. IMPRESSION: 3 cm mass in the posterior right upper lobe with some dystrophic calcification. In addition there is an abnormal precarinal lymph node. Malignancy is possible. Recommend further workup such as PET-CT scan when clinically appropriate. 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 The previous larger bulla in the right lung apex is not seen on the current study. Coronary artery calcifications. Please correlate for other coronary risk factors. Aortic Atherosclerosis (ICD10-I70.0) and Emphysema (ICD10-J43.9). Electronically Signed   By: Karen Kays M.D.   On: 10/17/2022 15:45   MR LIVER W WO CONTRAST  Result Date: 10/17/2022 CLINICAL DATA:  57 year old male with history of multiple indeterminate liver lesions noted on prior CT examination. Follow-up study. EXAM: MRI ABDOMEN WITHOUT AND WITH CONTRAST TECHNIQUE: Multiplanar multisequence MR imaging of the abdomen was performed both before and after the administration of intravenous contrast. CONTRAST:  6mL GADAVIST GADOBUTROL 1 MMOL/ML IV SOLN COMPARISON:  No prior abdominal MRI. CT of the  abdomen and pelvis 10/16/2022. FINDINGS: Lower chest: Unremarkable. Hepatobiliary: There are 4 lesions in the liver which are low T1 signal intensity, high T2 signal intensity, demonstrates restricted diffusion, and demonstrate heterogeneous enhancement on post gadolinium imaging generally centrally hypovascular with peripheral enhancement, highly suspicious for metastatic disease. The largest of these is located in the right lobe of the liver predominantly centered in segment 8 (axial image 28 of series 10) estimated to measure approximately 5.0 x 4.1 cm, with additional lesions in segment 4A (axial image 20 of series 10), central aspect of segment 8 (axial image 21 of series 10), and inferior aspect of segment 6 (axial image 51 of series 10). A few other scattered tiny subcentimeter T1 hypointense, T2 hyperintense, nonenhancing lesions are noted in the liver, compatible with tiny cysts and/or biliary hamartomas. No intra or extrahepatic biliary  ductal dilatation. Gallbladder is moderately distended. There is some dependent amorphous T1 hyperintense material which may represent some biliary sludge. Gallbladder wall does not appear thickened. No definite gallstones. Pancreas: No pancreatic mass. No pancreatic ductal dilatation. No pancreatic or peripancreatic fluid collections or inflammatory changes. Spleen:  Unremarkable. Adrenals/Urinary Tract: Bilateral kidneys and adrenal glands are normal in appearance. No hydroureteronephrosis in the visualized portions of the abdomen. Stomach/Bowel: Visualized portions are unremarkable. Vascular/Lymphatic: No aneurysm identified in the visualized abdominal vasculature. No definite lymphadenopathy noted in the abdomen. Other: No significant volume of ascites noted in the visualized portions of the peritoneal cavity. Musculoskeletal: No aggressive appearing osseous lesions are noted in the visualized portions of the skeleton. IMPRESSION: 1. Four aggressive appearing hepatic  lesions which have imaging characteristics most suggestive of metastatic disease to the liver. Further clinical evaluation to establish a source of primary malignancy is recommended. Electronically Signed   By: Trudie Reed M.D.   On: 10/17/2022 05:29   CT ABDOMEN PELVIS WO CONTRAST  Result Date: 10/16/2022 CLINICAL DATA:  Abdominal pain, acute, nonlocalized EXAM: CT ABDOMEN AND PELVIS WITHOUT CONTRAST TECHNIQUE: Multidetector CT imaging of the abdomen and pelvis was performed following the standard protocol without IV contrast. RADIATION DOSE REDUCTION: This exam was performed according to the departmental dose-optimization program which includes automated exposure control, adjustment of the mA and/or kV according to patient size and/or use of iterative reconstruction technique. COMPARISON:  09/04/2021 FINDINGS: Lower chest: No acute abnormality Hepatobiliary: Subtle low-density areas seen anteriorly on image 12, centrally on image 14, and in the right hepatic lobe anteriorly on image 18. These are difficult to characterize without IV contrast. Small cyst in the hepatic dome measures 7 mm. Gallbladder unremarkable. No biliary ductal dilatation. Pancreas: No focal abnormality or ductal dilatation. Spleen: No focal abnormality.  Normal size. Adrenals/Urinary Tract: No adrenal abnormality. No focal renal abnormality. No stones or hydronephrosis. Urinary bladder is unremarkable. Stomach/Bowel: Normal appendix. Stomach, large and small bowel grossly unremarkable. Vascular/Lymphatic: Scattered aortoiliac atherosclerosis. No evidence of aneurysm or adenopathy. Reproductive: No visible focal abnormality. Other: No free fluid or free air. Musculoskeletal: No acute bony abnormality. IMPRESSION: Subtle ill-defined hypo dense areas within the liver. Differential considerations would include focal fatty infiltration or masses. Recommend further evaluation with contrast-enhanced CT or right upper quadrant ultrasound.  Aortoiliac atherosclerosis. Electronically Signed   By: Charlett Nose M.D.   On: 10/16/2022 19:23   US Abdomen Limited  Result Date: 10/16/2022 CLINICAL DATA:  RUQ pain EXAM: ULTRASOUND ABDOMEN LIMITED RIGHT UPPER QUADRANT COMPARISON:  CT scan abdomen and pelvis from 09/04/2021. FINDINGS: Gallbladder: Physiologically distended. There is a sub 4 mm sessile gallbladder polyp. No gallstones or abnormal wall thickening. No pericholecystic free fluid. No sonographic Murphy sign noted by sonographer. Common bile duct: Diameter: Less than 4 mm. Liver: No focal lesion identified. Within normal limits in parenchymal echogenicity. Liver echotexture is within normal limits. However, there is heterogeneous echogenicity with ill-defined interspersed Iso and hyperechoic areas within the technologist measured at least. 2 ill-defined hyperechoic lesions measured 4.2 x 5.0 x 5.2 cm and 1.9 x 1.9 x 2.6 cm. These are incompletely imaged and characterized on the current examination. Further evaluation with multiphasic contrast-enhanced MRI abdomen as per liver mass protocol is recommended. Portal vein is patent on color Doppler imaging with normal direction of blood flow towards the liver. Other: None. IMPRESSION: 1. No evidence of cholecystitis. 2. Heterogeneous liver with ill-defined hyperechoic lesions. Further evaluation with multiphasic contrast-enhanced MRI abdomen as per liver mass  protocol is recommended. 3. There is a single sub 4 mm, low risk gallbladder polyp. Please see follow-up recommendations below. Management of Incidentally Detected Gallbladder Polyps: Society of Radiologists in Ultrasound Consensus Conference Recommendations (published online September 25, 2020): [email or web address] Extremely low-risk polyps, i.e. pedunculated ball-on-the-wall or thin stalk: <9 mm: no follow-up 10-14 mm: follow-up at 6, 12 and 24 months >15 mm: surgical consult Low-risk polyps, i.e. pedunculated with a thick or wide  stalk, or sessile: ?6 mm: no follow-up 7-9 mm follow-up ultrasound at 12 months 10-14 mm: follow-up ultrasound at 6, 12, 24, and 36 months vs surgical consult >15 mm: surgical consult Intermediate risk: focal wall-thickening >73mm adjacent to polyp: <6 mm: follow-up at 6, 12, 24, 36 months vs surgical consult >7 mm: surgical consult Electronically Signed   By: Jules Schick M.D.   On: 10/16/2022 15:32    ASSESSMENT & PLAN:  *** No orders of the defined types were placed in this encounter.   All questions were answered. The patient knows to call the clinic with any problems, questions or concerns. I spent {CHL ONC TIME VISIT - [id number]} counseling the patient face to face. The total time spent in the appointment was {CHL ONC TIME VISIT - [id number]} and more than 50% was on counseling.     Pollyann Samples, NP 10/30/2022 11:13 AM

## 2022-10-30 NOTE — Progress Notes (Signed)
[facility]   [phone] Fax:([phone]   Clinic New Consult Note   Patient Care Team: Pcp, No as PCP - General Patient, No Pcp Per (General Practice) 10/31/2022  CHIEF COMPLAINTS/PURPOSE OF CONSULTATION:  Metastatic colon cancer to liver   HISTORY OF PRESENTING ILLNESS:  Marvin Thomas 57 y.o. male with past medical history of COPD, chronic anemia, gallstone, GERD, PUD, substance abuse (marijuana and tobacco), is here because of recently diagnosed metastatic colon cancer to the liver.  He was referred by hospitalist.  He presents to clinic accompanied by his sister.  He presented with RUQ pain and melana and was admitted to hospital on 10/15/2024.  Labs showed microcytic anemia with hemoglobin 8.9.. Right upper quadrant ultrasound showed hyperechoic lesion and a single sub 4 mm low risk gallbladder polyp.  MRI liver showed 4 hepatic lesions concerning for metastatic disease.  He underwent colonoscopy on 10/18/2022 noting nonobstructing large mass in the transverse colon with biopsies taken.  Biopsy results returned with tubulovillous adenoma. EGD was also performed which showed cratered duodenal ulcer and a gastric ulcer.  Due to the poor prep for the first colonoscopy, he underwent repeat colonoscopy prep and repeat colonoscopy on 10/24/2022.  A malignant appearing partially obstructing tumor was noted at the ileocecal valve and biopsied but the biopsy was negative for malignant cells or high-grade dysplasia.  He also had a multiple colon polyps, including a 2 cm polyps in the transverse colon, removed, and  showed adenoma.  He was also able to undergo liver biopsy during hospitalization as well which showed metastatic adenocarcinoma, the IHC studies were consistent with colorectal primary.  He complains of severe upper abdominal pain, he rates it 10 out of 10, he has been taking Tylenol which helps a little.  He was prescribed oxycodone on discharge, but he has not  picked up.  He lives with his roommates, he has a sister and brother in town, which can help him if needed.  He works as a Gaffer, self-employed.  He does not have insurance, and does not have PCP.   MEDICAL HISTORY:  Past Medical History:  Diagnosis Date   Anemia Dx 2014   Gallstones    Hemothorax    Ulcer     SURGICAL HISTORY: Past Surgical History:  Procedure Laterality Date   BIOPSY  10/18/2022   Procedure: BIOPSY;  Surgeon: Kathi Der, MD;  Location: [facility];  Service: Gastroenterology;;   BIOPSY  10/24/2022   Procedure: BIOPSY;  Surgeon: Kerin Salen, MD;  Location: [facility];  Service: Gastroenterology;;   COLONOSCOPY WITH PROPOFOL N/A 10/18/2022   Procedure: COLONOSCOPY WITH PROPOFOL;  Surgeon: Kathi Der, MD;  Location: [facility];  Service: Gastroenterology;  Laterality: N/A;   COLONOSCOPY WITH PROPOFOL N/A 10/24/2022   Procedure: COLONOSCOPY WITH PROPOFOL;  Surgeon: Kerin Salen, MD;  Location: [facility];  Service: Gastroenterology;  Laterality: N/A;   ESOPHAGOGASTRODUODENOSCOPY (EGD) WITH PROPOFOL N/A 10/18/2022   Procedure: ESOPHAGOGASTRODUODENOSCOPY (EGD) WITH PROPOFOL;  Surgeon: Kathi Der, MD;  Location: [facility];  Service: Gastroenterology;  Laterality: N/A;   LAPAROTOMY N/A 12/09/2017   Procedure: EXPLORATORY LAPAROTOMY, REPAIR PERFORATED PYLEORIC ULCER;  Surgeon: Almond Lint, MD;  Location: [facility];  Service: General;  Laterality: N/A;   POLYPECTOMY  10/24/2022   Procedure: POLYPECTOMY;  Surgeon: Kerin Salen, MD;  Location: [facility];  Service: Gastroenterology;;   SUBMUCOSAL TATTOO INJECTION  10/18/2022   Procedure: SUBMUCOSAL TATTOO INJECTION;  Surgeon: Kathi Der, MD;  Location: [facility];  Service:  Gastroenterology;;    SOCIAL HISTORY: Social History   Socioeconomic History   Marital status: Single    Spouse name: Not on file   Number of children: 1   Years of education: 12    Highest education level: Not on file   Occupational History   Occupation: Self-Employed    Comment: Landscaping and Financial trader   Tobacco Use   Smoking status: Every Day    Current packs/day: 1.50    Average packs/day: 1.5 packs/day for 31.0 years (46.5 ttl pk-yrs)    Types: Cigarettes   Smokeless tobacco: Current  Vaping Use   Vaping status: Every Day  Substance and Sexual Activity   Alcohol use: Yes    Comment: quit 1980's    Drug use: Yes    Types: Marijuana    Comment: Patient reports using last night   Sexual activity: Yes    Birth control/protection: Condom  Other Topics Concern   Not on file  Social History Narrative   ** Merged History Encounter ** Lives with mother   Steed Babbit    Has 64 yo son.    Lives with his mother and grandmother.    Social Determinants of Health   Financial Resource Strain: High Risk (10/31/2022)   Overall Financial Resource Strain (CARDIA)    Difficulty of Paying Living Expenses: Hard  Food Insecurity: No Food Insecurity (10/16/2022)   Hunger Vital Sign    Worried About Running Out of Food in the Last Year: Never true    Ran Out of Food in the Last Year: Never true  Transportation Needs: No Transportation Needs (10/16/2022)   PRAPARE - Administrator, Civil Service (Medical): No    Lack of Transportation (Non-Medical): No  Physical Activity: Not on File (07/11/2021)   Received from [place], [place]   Physical Activity    Physical Activity: 0  Stress: Not on File (07/11/2021)   Received from [facility], [place]   Stress    Stress: 0  Social Connections: Not on File (07/11/2021)   Received from [place], [place]   Social Connections    Social Connections and Isolation: 0  Intimate Partner Violence: Not At Risk (10/16/2022)   Humiliation, Afraid, Rape, and Kick questionnaire    Fear of Current or Ex-Partner: No    Emotionally Abused: No    Physically Abused: No    Sexually Abused: No    FAMILY HISTORY: Family History  Problem Relation Age of Onset   Diabetes  Mother    Hypertension Mother    Heart disease Mother        x 3    Cancer Sister        BREAST CANCER   Heart disease Brother    Hypertension Brother    Hypertension Brother    Hypertension Brother    Diabetes Other     ALLERGIES:  is allergic to aspirin.  MEDICATIONS:  Current Outpatient Medications  Medication Sig Dispense Refill   acetaminophen (TYLENOL) 500 MG tablet Take 1,000 mg by mouth every 6 (six) hours as needed for mild pain or headache.     amoxicillin (AMOXIL) 500 MG capsule Take 2 capsules (1,000 mg total) by mouth every 12 (twelve) hours for 8 days. 32 capsule 0   BENADRYL ALLERGY 25 MG tablet Take 25-50 mg by mouth every 6 (six) hours as needed for sleep.     clarithromycin (BIAXIN) 500 MG tablet Take 1 tablet (500 mg total) by mouth every 12 (twelve) hours for  8 days. 16 tablet 0   cyanocobalamin 1000 MCG tablet Take 1 tablet (1,000 mcg total) by mouth daily.     lidocaine-prilocaine (EMLA) cream Apply to affected area once 30 g 3   ondansetron (ZOFRAN) 8 MG tablet Take 1 tablet (8 mg total) by mouth every 8 (eight) hours as needed for nausea or vomiting. Start on the third day after chemotherapy. 30 tablet 1   oxyCODONE (ROXICODONE) 5 MG immediate release tablet Take 1 tablet (5 mg total) by mouth every 4 (four) hours as needed for severe pain. 30 tablet 0   pantoprazole (PROTONIX) 40 MG tablet Take 1 tablet (40 mg total) by mouth daily. 30 tablet 3   pantoprazole (PROTONIX) 40 MG tablet Take 1 tablet (40 mg total) by mouth daily. 90 tablet 0   prochlorperazine (COMPAZINE) 10 MG tablet Take 1 tablet (10 mg total) by mouth every 6 (six) hours as needed for nausea or vomiting. 20 tablet 0   prochlorperazine (COMPAZINE) 10 MG tablet Take 1 tablet (10 mg total) by mouth every 6 (six) hours as needed for nausea or vomiting. 30 tablet 1   No current facility-administered medications for this visit.    REVIEW OF SYSTEMS:   Constitutional: Denies fevers, chills or  abnormal night sweats Eyes: Denies blurriness of vision, double vision or watery eyes Ears, nose, mouth, throat, and face: Denies mucositis or sore throat Respiratory: Denies cough, dyspnea or wheezes Cardiovascular: Denies palpitation, chest discomfort or lower extremity swelling Gastrointestinal:  Denies nausea, heartburn or change in bowel habits Skin: Denies abnormal skin rashes Lymphatics: Denies new lymphadenopathy or easy bruising Neurological:Denies numbness, tingling or new weaknesses Behavioral/Psych: Mood is stable, no new changes  All other systems were reviewed with the patient and are negative.  PHYSICAL EXAMINATION: ECOG PERFORMANCE STATUS: 1 - Symptomatic but completely ambulatory  Vitals:   10/31/22 1006  BP: 127/73  Pulse: (!) 59  Resp: 18  Temp: 98.3 F (36.8 C)  SpO2: 100%   Filed Weights   10/31/22 1006  Weight: 137 lb 3.2 oz (62.2 kg)    GENERAL:alert, no distress and comfortable SKIN: skin color, texture, turgor are normal, no rashes or significant lesions EYES: normal, conjunctiva are pink and non-injected, sclera clear OROPHARYNX:no exudate, no erythema and lips, buccal mucosa, and tongue normal  NECK: supple, thyroid normal size, non-tender, without nodularity LYMPH:  no palpable lymphadenopathy in the cervical, axillary or inguinal LUNGS: clear to auscultation and percussion with normal breathing effort HEART: regular rate & rhythm and no murmurs and no lower extremity edema ABDOMEN:abdomen soft, non-tender and normal bowel sounds Musculoskeletal:no cyanosis of digits and no clubbing  PSYCH: alert & oriented x 3 with fluent speech NEURO: no focal motor/sensory deficits  LABORATORY DATA:  I have reviewed the data as listed    Latest Ref Rng & Units 10/29/2022    2:00 AM 10/24/2022    5:07 AM 10/23/2022    5:17 AM  CBC  WBC 4.0 - 10.5 K/uL 6.4  6.5  6.9   Hemoglobin 13.0 - 17.0 g/dL 9.1  9.0  8.6   Hematocrit 39.0 - 52.0 % 30.8  30.0  28.2    Platelets 150 - 400 K/uL 388  281  263     @cmpl @  RADIOGRAPHIC STUDIES: I have personally reviewed the radiological images as listed and agreed with the findings in the report. CT ABDOMEN PELVIS W CONTRAST  Result Date: 10/29/2022 CLINICAL DATA:  Left upper quadrant pain. Right-sided liver biopsy performed 10/23/2022  demonstrated metastatic adenocarcinoma compatible with a colorectal primary. EXAM: CT ABDOMEN AND PELVIS WITH CONTRAST TECHNIQUE: Multidetector CT imaging of the abdomen and pelvis was performed using the standard protocol following bolus administration of intravenous contrast. RADIATION DOSE REDUCTION: This exam was performed according to the departmental dose-optimization program which includes automated exposure control, adjustment of the mA and/or kV according to patient size and/or use of iterative reconstruction technique. CONTRAST:  80mL OMNIPAQUE IOHEXOL 300 MG/ML  SOLN COMPARISON:  CT without contrast 10/16/2022, MRI without and with contrast 10/16/2022, CT with IV contrast 09/04/2021. FINDINGS: Lower chest: No lung base nodules or infiltrates. COPD change. The cardiac size is normal. Hepatobiliary: Metastatic liver masses are again noted. The largest 2 both of segment 8, the more lateral of the 2 lesions measuring 5.1 cm transverse on 2:17, previously 4.3 cm. Medial to this the other segment 8 lesion is 4.4 cm, previously 4.5 cm. More cephalad in segment 4A, a third metastasis measures 3 cm on 2:13, stable. A fourth metastasis inferiorly in segment 6 measures 2.6 cm, previously 2.6 cm. Small scattered cysts in the hepatic dome are also again noted but no new metastasis is seen. The gallbladder and bile ducts are unremarkable. Pancreas: There is upper abdominal mesenteric edema which could be congestive, due to malnutrition or hepatic dysfunction. Some of this is seen adjacent to the pancreas. It is possible the patient could have pancreatitis but no peripancreatic fluid is seen or  ductal dilatation. There is no mass enhancement. Correlate with serum lipase. Spleen: No abnormality. Adrenals/Urinary Tract: Adrenal glands are unremarkable. Kidneys are normal, without renal calculi, focal lesion, or hydronephrosis. Bladder is unremarkable. Stomach/Bowel: Diffuse thickened folds in the stomach are chronically seen. The small intestine is normal in caliber. The appendix is normal in caliber and best seen on the coronal reformatting. Moderate fecal stasis. In the mid transverse colon just left of the midline there is a rounded masslike wall abnormality measuring 3.3 x 3.2 cm on 2:49, 2.6 cm in height on 7:35, and filling most of the luminal volume, which is suspected to be a primary tumor. There is scattered colonic diverticulosis without evidence of diverticulitis or colitis. Vascular/Lymphatic: Aortic atherosclerosis. No enlarged abdominal or pelvic lymph nodes. Reproductive: Prostate is unremarkable. Other: Minimal pelvic ascites. No free air. Hazy edema persists throughout the mesentery but especially in the abdomen. Trace subhepatic ascites. Musculoskeletal: Degenerative change thoracic and lumbar spine, lumbar spondylosis and multilevel endplate Schmorl's nodes. No aggressive osseous lesion is seen. IMPRESSION: 1. 3.3 x 3.2 x 2.6 cm rounded masslike wall abnormality in the mid transverse colon just left of the midline, suspected to be a primary tumor. This fills most of luminal volume but there is no evidence of an upstream mechanical obstruction. 2. Four metastatic liver masses are again noted, with the largest lesion in segment 8 measuring 5.1 cm, previously 4.3 cm. 3. Mesenteric edema which could be congestive, due to malnutrition or hepatic dysfunction. Some of this is seen adjacent to the pancreas. It is possible the patient could have pancreatitis but no peripancreatic fluid or ductal dilatation is seen. Correlate with serum lipase. 4. Diverticulosis and moderate fecal stasis. 5. Aortic  atherosclerosis. Aortic Atherosclerosis (ICD10-I70.0). Electronically Signed   By: Almira Bar M.D.   On: 10/29/2022 05:02   US BIOPSY (LIVER)  Result Date: 10/23/2022 INDICATION: Multiple indeterminate liver lesions. EXAM: Ultrasound-guided biopsy of right liver mass MEDICATIONS: None. ANESTHESIA/SEDATION: Moderate (conscious) sedation was employed during this procedure. A total of Versed 2 mg and  Fentanyl 100 mcg was administered intravenously by the radiology nurse. Total intra-service moderate Sedation Time: 13 minutes. The patient's level of consciousness and vital signs were monitored continuously by radiology nursing throughout the procedure under my direct supervision. COMPLICATIONS: None immediate. PROCEDURE: Informed written consent was obtained from the patient after a thorough discussion of the procedural risks, benefits and alternatives. All questions were addressed. Maximal Sterile Barrier Technique was utilized including caps, mask, sterile gowns, sterile gloves, sterile drape, hand hygiene and skin antiseptic. A timeout was performed prior to the initiation of the procedure. Patient position supine on the ultrasound table. Right upper quadrant skin prepped and draped in usual sterile fashion. Following local lidocaine administration, 17 gauge introducer needle was advanced into the right hepatic lobe, and 4- 18 gauge cores were obtained utilizing continuous ultrasound guidance. Gelfoam slurry was administered through the introducer needle at the biopsy site. Samples were sent to pathology in formalin. Needle removed and hemostasis achieved with 5 minutes of manual compression. Post procedure ultrasound images showed no evidence of significant hemorrhage. IMPRESSION: Ultrasound-guided core needle biopsy of right liver mass. Electronically Signed   By: Acquanetta Belling M.D.   On: 10/23/2022 16:03   CT CHEST W CONTRAST  Result Date: 10/17/2022 CLINICAL DATA:  Staging hepatocellular carcinoma. *  Tracking Code: BO * EXAM: CT CHEST WITH CONTRAST TECHNIQUE: Multidetector CT imaging of the chest was performed during intravenous contrast administration. RADIATION DOSE REDUCTION: This exam was performed according to the departmental dose-optimization program which includes automated exposure control, adjustment of the mA and/or kV according to patient size and/or use of iterative reconstruction technique. CONTRAST:  75mL OMNIPAQUE IOHEXOL 300 MG/ML  SOLN COMPARISON:  CT angiogram chest 09/04/2021. Abdomen and pelvis imaging 10/16/2018 FINDINGS: Cardiovascular: Thoracic aorta has a normal course and caliber with slight atherosclerotic plaque along the descending thoracic aorta. Coronary artery calcifications are also seen. Please correlate for other coronary risk factors. The heart nonenlarged. Trace pericardial effusion. Mediastinum/Nodes: No specific abnormal lymph node enlargement seen in the axillary region, hilum. There is an enlarged lymph node precarinal on series 2 image 65 measuring 14 x 21 mm. No other abnormal lymph node enlargement clearly seen in the mediastinum. Lungs/Pleura: Paraseptal and centrilobular emphysematous lung changes are identified. No pneumothorax, effusion or consolidation. Areas of scarring and fibrotic changes. There is a nodule along the right upper lobe medially with some calcifications and pleural thickening measuring 3.0 x 1.9 cm on series 6, image 36. This has not seen on the prior examination. Numerous location previously there is a large bulla which is no longer identified. In principle, mass lesion is not excluded. Upper Abdomen: Again mass lesion seen in the liver. Please correlate for prior workup. Musculoskeletal: Scattered degenerative changes are seen along the spine. IMPRESSION: 3 cm mass in the posterior right upper lobe with some dystrophic calcification. In addition there is an abnormal precarinal lymph node. Malignancy is possible. Recommend further workup such as  PET-CT scan when clinically appropriate. 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 The previous larger bulla in the right lung apex is not seen on the current study. Coronary artery calcifications. Please correlate for other coronary risk factors. Aortic Atherosclerosis (ICD10-I70.0) and Emphysema (ICD10-J43.9). Electronically Signed   By: Karen Kays M.D.   On: 10/17/2022 15:45   MR LIVER W WO CONTRAST  Result Date: 10/17/2022 CLINICAL DATA:  57 year old male with history of multiple indeterminate liver lesions noted on prior CT examination. Follow-up study. EXAM: MRI ABDOMEN WITHOUT AND WITH  CONTRAST TECHNIQUE: Multiplanar multisequence MR imaging of the abdomen was performed both before and after the administration of intravenous contrast. CONTRAST:  6mL GADAVIST GADOBUTROL 1 MMOL/ML IV SOLN COMPARISON:  No prior abdominal MRI. CT of the abdomen and pelvis 10/16/2022. FINDINGS: Lower chest: Unremarkable. Hepatobiliary: There are 4 lesions in the liver which are low T1 signal intensity, high T2 signal intensity, demonstrates restricted diffusion, and demonstrate heterogeneous enhancement on post gadolinium imaging generally centrally hypovascular with peripheral enhancement, highly suspicious for metastatic disease. The largest of these is located in the right lobe of the liver predominantly centered in segment 8 (axial image 28 of series 10) estimated to measure approximately 5.0 x 4.1 cm, with additional lesions in segment 4A (axial image 20 of series 10), central aspect of segment 8 (axial image 21 of series 10), and inferior aspect of segment 6 (axial image 51 of series 10). A few other scattered tiny subcentimeter T1 hypointense, T2 hyperintense, nonenhancing lesions are noted in the liver, compatible with tiny cysts and/or biliary hamartomas. No intra or extrahepatic biliary ductal dilatation. Gallbladder is moderately distended. There is some dependent amorphous T1 hyperintense material  which may represent some biliary sludge. Gallbladder wall does not appear thickened. No definite gallstones. Pancreas: No pancreatic mass. No pancreatic ductal dilatation. No pancreatic or peripancreatic fluid collections or inflammatory changes. Spleen:  Unremarkable. Adrenals/Urinary Tract: Bilateral kidneys and adrenal glands are normal in appearance. No hydroureteronephrosis in the visualized portions of the abdomen. Stomach/Bowel: Visualized portions are unremarkable. Vascular/Lymphatic: No aneurysm identified in the visualized abdominal vasculature. No definite lymphadenopathy noted in the abdomen. Other: No significant volume of ascites noted in the visualized portions of the peritoneal cavity. Musculoskeletal: No aggressive appearing osseous lesions are noted in the visualized portions of the skeleton. IMPRESSION: 1. Four aggressive appearing hepatic lesions which have imaging characteristics most suggestive of metastatic disease to the liver. Further clinical evaluation to establish a source of primary malignancy is recommended. Electronically Signed   By: Trudie Reed M.D.   On: 10/17/2022 05:29   CT ABDOMEN PELVIS WO CONTRAST  Result Date: 10/16/2022 CLINICAL DATA:  Abdominal pain, acute, nonlocalized EXAM: CT ABDOMEN AND PELVIS WITHOUT CONTRAST TECHNIQUE: Multidetector CT imaging of the abdomen and pelvis was performed following the standard protocol without IV contrast. RADIATION DOSE REDUCTION: This exam was performed according to the departmental dose-optimization program which includes automated exposure control, adjustment of the mA and/or kV according to patient size and/or use of iterative reconstruction technique. COMPARISON:  09/04/2021 FINDINGS: Lower chest: No acute abnormality Hepatobiliary: Subtle low-density areas seen anteriorly on image 12, centrally on image 14, and in the right hepatic lobe anteriorly on image 18. These are difficult to characterize without IV contrast. Small  cyst in the hepatic dome measures 7 mm. Gallbladder unremarkable. No biliary ductal dilatation. Pancreas: No focal abnormality or ductal dilatation. Spleen: No focal abnormality.  Normal size. Adrenals/Urinary Tract: No adrenal abnormality. No focal renal abnormality. No stones or hydronephrosis. Urinary bladder is unremarkable. Stomach/Bowel: Normal appendix. Stomach, large and small bowel grossly unremarkable. Vascular/Lymphatic: Scattered aortoiliac atherosclerosis. No evidence of aneurysm or adenopathy. Reproductive: No visible focal abnormality. Other: No free fluid or free air. Musculoskeletal: No acute bony abnormality. IMPRESSION: Subtle ill-defined hypo dense areas within the liver. Differential considerations would include focal fatty infiltration or masses. Recommend further evaluation with contrast-enhanced CT or right upper quadrant ultrasound. Aortoiliac atherosclerosis. Electronically Signed   By: Charlett Nose M.D.   On: 10/16/2022 19:23   US Abdomen Limited  Result Date: 10/16/2022 CLINICAL DATA:  RUQ pain EXAM: ULTRASOUND ABDOMEN LIMITED RIGHT UPPER QUADRANT COMPARISON:  CT scan abdomen and pelvis from 09/04/2021. FINDINGS: Gallbladder: Physiologically distended. There is a sub 4 mm sessile gallbladder polyp. No gallstones or abnormal wall thickening. No pericholecystic free fluid. No sonographic Murphy sign noted by sonographer. Common bile duct: Diameter: Less than 4 mm. Liver: No focal lesion identified. Within normal limits in parenchymal echogenicity. Liver echotexture is within normal limits. However, there is heterogeneous echogenicity with ill-defined interspersed Iso and hyperechoic areas within the technologist measured at least. 2 ill-defined hyperechoic lesions measured 4.2 x 5.0 x 5.2 cm and 1.9 x 1.9 x 2.6 cm. These are incompletely imaged and characterized on the current examination. Further evaluation with multiphasic contrast-enhanced MRI abdomen as per liver mass protocol is  recommended. Portal vein is patent on color Doppler imaging with normal direction of blood flow towards the liver. Other: None. IMPRESSION: 1. No evidence of cholecystitis. 2. Heterogeneous liver with ill-defined hyperechoic lesions. Further evaluation with multiphasic contrast-enhanced MRI abdomen as per liver mass protocol is recommended. 3. There is a single sub 4 mm, low risk gallbladder polyp. Please see follow-up recommendations below. Management of Incidentally Detected Gallbladder Polyps: Society of Radiologists in Ultrasound Consensus Conference Recommendations (published online September 25, 2020): [email or web address] Extremely low-risk polyps, i.e. pedunculated ball-on-the-wall or thin stalk: <9 mm: no follow-up 10-14 mm: follow-up at 6, 12 and 24 months >15 mm: surgical consult Low-risk polyps, i.e. pedunculated with a thick or wide stalk, or sessile: ?6 mm: no follow-up 7-9 mm follow-up ultrasound at 12 months 10-14 mm: follow-up ultrasound at 6, 12, 24, and 36 months vs surgical consult >15 mm: surgical consult Intermediate risk: focal wall-thickening >74mm adjacent to polyp: <6 mm: follow-up at 6, 12, 24, 36 months vs surgical consult >7 mm: surgical consult Electronically Signed   By: Jules Schick M.D.   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  His workup in the hospital reviewed metastatic colon cancer to liver and lung.  I reviewed his lab, biopsy results, images, and multiple endoscopy reports, and discussed the findings with patient and his sister in detail. -Although his biopsy of the cecal mass was negative for malignant cells, his liver biopsy confirmed adenocarcinoma and immunostain will consistent with  colorectal primary.  Based on the colonoscopy findings, and severe iron deficiency, this is consistent with metastatic colon cancer -His CT chest that showed a 3 cm mass in the right upper lobe with calcification,, and enlarged precarinal lymph node, concerning for malignancy.  He is a heavy smoker, primary lung cancer is also a possibility.  However given the diffuse liver metastasis from his colon cancer, this is likely lung metastasis from his colon cancer.  If he does not respond well to chemotherapy, I may consider bronchoscopy and a biopsy in the future. -We discussed the incurable nature of his metastatic colon cancer, due to the diffuse metastasis.  And overall poor prognosis. -I recommend systemic therapy.  Discussed option of FOLFOX, Capeox, FOLFIRI, and biological agent bevacizumab, given the primary colon cancer in the right side.  He is interested in chemo, I recommend first-line FOLFOX and bevacizumab. -Chemotherapy consent: Side effects including but does not not limited to, fatigue,  nausea, vomiting, diarrhea, hair loss, neuropathy, fluid retention, renal and kidney dysfunction, neutropenic fever, needed for blood transfusion, bleeding, hypertension, proteinuria, risk of thrombosis or hemorrhage, bowel perforation, were discussed with patient in great detail. He agrees to proceed. -The goal of chemotherapy is palliative, to prolong his life and improve his cancer related symptoms -Due to port placement, I will hold on bevacizumab for first cycle -Will request molecular testing MMR and next generation sequencing for [facility], to see if he is a candidate for immunotherapy or targeted therapy  2.  Severe abdominal pain -Likely related to the diffuse liver metastasis -He will prescribed with oxycodone on hospital discharge, I encouraged him to pick up -Will refer him to palliative care for pain management  3.  Social issues -He lives with roommates, does not have support from his brother and  sister who live in local -I will refer him to social worker, Darl Pikes met patient and his sister today. -He was started Event organiser, and he wants to apply for disability also.  4.  Iron deficient anemia -Secondary to his colon cancer, he received a blood transfusion in the hospital. -I recommend IV iron, he has no insurance, will try to get free IV Venofer for him.  Will start as soon as we have the medicine  5. Constipation -He has severe constipation, no bowel obstruction on CT scan -I recommend him trying MiraLAX or Senokot, and increase dose if needed.   Plan -Port placement by IR in the next few weeks -Chemo class -First cycle chemotherapy FOLFOX in about 2 weeks -Will request MMR and [facility] on his liver biopsy -Will refer him to palliative care clinic for pain management -I will see him back with the first cycle chemo -iv venofer 400mg  weeklyX2 after drug replacement    Orders Placed This Encounter  Procedures   CBC with Differential/Platelet    Standing Status:   Standing    Number of Occurrences:   50    Standing Expiration Date:   10/31/2023   Ferritin    Standing Status:   Standing    Number of Occurrences:   20    Standing Expiration Date:   10/31/2023   Comprehensive metabolic panel    Standing Status:   Standing    Number of Occurrences:   50    Standing Expiration Date:   10/31/2023   CBC with Differential (Cancer Center Only)    Standing Status:   Future    Standing Expiration Date:   11/14/2023   CMP (Cancer Center only)    Standing Status:   Future    Standing Expiration Date:   11/14/2023   Total Protein, Urine dipstick    Standing Status:   Future    Standing Expiration Date:   11/14/2023   CBC with Differential (Cancer Center Only)    Standing Status:   Future    Standing Expiration Date:   11/28/2023   CMP (Cancer Center only)    Standing Status:   Future    Standing Expiration Date:   11/28/2023   CBC with Differential (Cancer Center Only)     Standing Status:   Future    Standing Expiration Date:   12/12/2023   CMP (Cancer Center only)    Standing Status:   Future    Standing Expiration Date:   12/12/2023   Total Protein, Urine dipstick    Standing Status:   Future    Standing Expiration Date:   12/12/2023   CBC with  Differential (Cancer Center Only)    Standing Status:   Future    Standing Expiration Date:   12/26/2023   CMP (Cancer Center only)    Standing Status:   Future    Standing Expiration Date:   12/26/2023   Amb Referral to Palliative Care    Referral Priority:   Urgent    Referral Type:   Consultation    Number of Visits Requested:   1    All questions were answered. The patient knows to call the clinic with any problems, questions or concerns. I spent 60 minutes counseling the patient face to face. The total time spent in the appointment was 75 minutes and more than 50% was on counseling.     [name], MD 10/31/2022 12:45 PM

## 2022-10-31 ENCOUNTER — Other Ambulatory Visit: Payer: Self-pay

## 2022-10-31 ENCOUNTER — Inpatient Hospital Stay: Payer: No Typology Code available for payment source

## 2022-10-31 ENCOUNTER — Inpatient Hospital Stay: Payer: No Typology Code available for payment source | Admitting: Licensed Clinical Social Worker

## 2022-10-31 ENCOUNTER — Encounter: Payer: Self-pay | Admitting: Hematology

## 2022-10-31 ENCOUNTER — Inpatient Hospital Stay: Payer: No Typology Code available for payment source | Attending: Physician Assistant | Admitting: Hematology

## 2022-10-31 VITALS — BP 127/73 | HR 59 | Temp 98.3°F | Resp 18 | Ht 69.0 in | Wt 137.2 lb

## 2022-10-31 DIAGNOSIS — C787 Secondary malignant neoplasm of liver and intrahepatic bile duct: Secondary | ICD-10-CM | POA: Diagnosis not present

## 2022-10-31 DIAGNOSIS — C7800 Secondary malignant neoplasm of unspecified lung: Secondary | ICD-10-CM | POA: Diagnosis not present

## 2022-10-31 DIAGNOSIS — Z79631 Long term (current) use of antimetabolite agent: Secondary | ICD-10-CM | POA: Insufficient documentation

## 2022-10-31 DIAGNOSIS — K824 Cholesterolosis of gallbladder: Secondary | ICD-10-CM | POA: Insufficient documentation

## 2022-10-31 DIAGNOSIS — C189 Malignant neoplasm of colon, unspecified: Secondary | ICD-10-CM | POA: Insufficient documentation

## 2022-10-31 DIAGNOSIS — K5730 Diverticulosis of large intestine without perforation or abscess without bleeding: Secondary | ICD-10-CM | POA: Diagnosis not present

## 2022-10-31 DIAGNOSIS — I708 Atherosclerosis of other arteries: Secondary | ICD-10-CM | POA: Insufficient documentation

## 2022-10-31 DIAGNOSIS — C180 Malignant neoplasm of cecum: Secondary | ICD-10-CM | POA: Insufficient documentation

## 2022-10-31 DIAGNOSIS — J439 Emphysema, unspecified: Secondary | ICD-10-CM | POA: Diagnosis not present

## 2022-10-31 DIAGNOSIS — I3139 Other pericardial effusion (noninflammatory): Secondary | ICD-10-CM | POA: Diagnosis not present

## 2022-10-31 DIAGNOSIS — Z5986 Financial insecurity: Secondary | ICD-10-CM | POA: Insufficient documentation

## 2022-10-31 DIAGNOSIS — K5900 Constipation, unspecified: Secondary | ICD-10-CM | POA: Diagnosis not present

## 2022-10-31 DIAGNOSIS — Z79630 Long term (current) use of alkylating agent: Secondary | ICD-10-CM | POA: Diagnosis not present

## 2022-10-31 DIAGNOSIS — F17210 Nicotine dependence, cigarettes, uncomplicated: Secondary | ICD-10-CM | POA: Diagnosis not present

## 2022-10-31 DIAGNOSIS — G893 Neoplasm related pain (acute) (chronic): Secondary | ICD-10-CM | POA: Insufficient documentation

## 2022-10-31 DIAGNOSIS — K219 Gastro-esophageal reflux disease without esophagitis: Secondary | ICD-10-CM | POA: Insufficient documentation

## 2022-10-31 DIAGNOSIS — D123 Benign neoplasm of transverse colon: Secondary | ICD-10-CM | POA: Diagnosis not present

## 2022-10-31 DIAGNOSIS — Z8719 Personal history of other diseases of the digestive system: Secondary | ICD-10-CM | POA: Insufficient documentation

## 2022-10-31 DIAGNOSIS — M47816 Spondylosis without myelopathy or radiculopathy, lumbar region: Secondary | ICD-10-CM | POA: Insufficient documentation

## 2022-10-31 DIAGNOSIS — I700 Atherosclerosis of aorta: Secondary | ICD-10-CM | POA: Diagnosis not present

## 2022-10-31 DIAGNOSIS — R188 Other ascites: Secondary | ICD-10-CM | POA: Insufficient documentation

## 2022-10-31 DIAGNOSIS — I2510 Atherosclerotic heart disease of native coronary artery without angina pectoris: Secondary | ICD-10-CM | POA: Diagnosis not present

## 2022-10-31 DIAGNOSIS — M47814 Spondylosis without myelopathy or radiculopathy, thoracic region: Secondary | ICD-10-CM | POA: Diagnosis not present

## 2022-10-31 DIAGNOSIS — Z8711 Personal history of peptic ulcer disease: Secondary | ICD-10-CM | POA: Diagnosis not present

## 2022-10-31 DIAGNOSIS — Z803 Family history of malignant neoplasm of breast: Secondary | ICD-10-CM | POA: Insufficient documentation

## 2022-10-31 DIAGNOSIS — Z833 Family history of diabetes mellitus: Secondary | ICD-10-CM | POA: Insufficient documentation

## 2022-10-31 DIAGNOSIS — Z8249 Family history of ischemic heart disease and other diseases of the circulatory system: Secondary | ICD-10-CM | POA: Insufficient documentation

## 2022-10-31 DIAGNOSIS — Z79899 Other long term (current) drug therapy: Secondary | ICD-10-CM | POA: Insufficient documentation

## 2022-10-31 DIAGNOSIS — D509 Iron deficiency anemia, unspecified: Secondary | ICD-10-CM | POA: Insufficient documentation

## 2022-10-31 DIAGNOSIS — D500 Iron deficiency anemia secondary to blood loss (chronic): Secondary | ICD-10-CM

## 2022-10-31 DIAGNOSIS — Z886 Allergy status to analgesic agent status: Secondary | ICD-10-CM | POA: Insufficient documentation

## 2022-10-31 MED ORDER — LIDOCAINE-PRILOCAINE 2.5-2.5 % EX CREA: TOPICAL_CREAM | CUTANEOUS | 3 refills | Status: DC

## 2022-10-31 MED ORDER — PROCHLORPERAZINE MALEATE 10 MG PO TABS: 10.0000 mg | ORAL_TABLET | Freq: Four times a day (QID) | ORAL | 1 refills | Status: DC | PRN

## 2022-10-31 MED ORDER — ONDANSETRON HCL 8 MG PO TABS: 8.0000 mg | ORAL_TABLET | Freq: Three times a day (TID) | ORAL | 1 refills | Status: DC | PRN

## 2022-10-31 NOTE — Progress Notes (Signed)
START ON PATHWAY REGIMEN - Colorectal     A cycle is every 14 days:     Bevacizumab-xxxx      Oxaliplatin      Leucovorin      Fluorouracil      Fluorouracil   **Always confirm dose/schedule in your pharmacy ordering system**  Patient Characteristics: Distant Metastases, Nonsurgical Candidate, Non-KRAS G12C, RAS Mutation Positive/Unknown (BRAF V600 Wild-Type/Unknown), Standard Cytotoxic Therapy, First Line Standard Cytotoxic Therapy, Bevacizumab Eligible, PS = 0,1 Tumor Location: Colon Therapeutic Status: Distant Metastases Microsatellite/Mismatch Repair Status: Unknown BRAF Mutation Status: Awaiting Test Results KRAS/NRAS Mutation Status: Awaiting Test Results Preferred Therapy Approach: Standard Cytotoxic Therapy Standard Cytotoxic Line of Therapy: First Line Standard Cytotoxic Therapy ECOG Performance Status: 1 Bevacizumab Eligibility: Eligible Intent of Therapy: Non-Curative / Palliative Intent, Discussed with Patient

## 2022-10-31 NOTE — Progress Notes (Signed)
[facility] Clinical Social Work  Initial Assessment   Marvin Thomas is a 57 y.o. year old male accompanied by patient and sister. Clinical Social Work was referred by medical provider for assessment of psychosocial needs.   SDOH (Social Determinants of Health) assessments performed: Yes   SDOH Screenings   Food Insecurity: No Food Insecurity (10/16/2022)  Housing: Low Risk  (10/16/2022)  Transportation Needs: No Transportation Needs (10/16/2022)  Utilities: Not At Risk (10/16/2022)  Financial Resource Strain: Not on File (07/11/2021)   Received from [place], [place]  Physical Activity: Not on File (07/11/2021)   Received from [place], [place]  Social Connections: Not on File (07/11/2021)   Received from [place], [place]  Stress: Not on File (07/11/2021)   Received from [place], [place]  Tobacco Use: High Risk (10/31/2022)     Distress Screen completed: No     No data to display            Family/Social Information:  Housing Arrangement: Pt states he resides in a rooming house. Family members/support persons in your life? Pt was accompanied by his sister and states his mother and other family members are local who can offer assistance.  Pt also has a 30 y/o son.   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  Income source: No income Financial concerns: Yes, current concerns Type of concern: Utilities Food access concerns: no Religious or spiritual practice: Not known Services Currently in place:  none  Coping/ Adjustment to diagnosis: Patient understands treatment plan and what happens next? yes Concerns about diagnosis and/or treatment: Losing my job and/or losing income, Overwhelmed by information, How I will pay for the services I need, How will I care for myself, and Quality of life Patient reported stressors: Finances and Adjusting to my illness Hopes and/or priorities: pt's priority  is to start treatment w/ the hope of positive results Patient enjoys  not addressed Current coping skills/ strengths: Motivation for treatment/growth , Physical Health , and Supportive family/friends     SUMMARY: Current SDOH Barriers:  Financial constraints related to lack of income  Clinical Social Work Clinical Goal(s):  Scientist, research (life sciences) options for unmet needs related to:  Financial Strain   Interventions: Discussed common feeling and emotions when being diagnosed with cancer, and the importance of support during treatment Informed patient of the support team roles and support services at [facility] Provided CSW contact information and encouraged patient to call with any questions or concerns Provided patient with information about applying for Medicaid and SSI.  Provided pt w/ contact details for financial resource specialist to contact once treatment starts to apply for the [employer].   Follow Up Plan: Patient will contact CSW with any support or resource needs Patient verbalizes understanding of plan: Yes    Rachel Moulds, LCSW Clinical Social Worker [facility]

## 2022-11-01 ENCOUNTER — Other Ambulatory Visit: Payer: Self-pay

## 2022-11-03 ENCOUNTER — Other Ambulatory Visit: Payer: Self-pay

## 2022-11-03 ENCOUNTER — Other Ambulatory Visit: Payer: Self-pay | Admitting: Pharmacist

## 2022-11-03 ENCOUNTER — Telehealth: Payer: Self-pay | Admitting: Nurse Practitioner

## 2022-11-03 ENCOUNTER — Telehealth: Payer: Self-pay | Admitting: Hematology

## 2022-11-03 ENCOUNTER — Telehealth: Payer: Self-pay

## 2022-11-03 ENCOUNTER — Encounter: Payer: Self-pay | Admitting: Hematology

## 2022-11-03 DIAGNOSIS — C787 Secondary malignant neoplasm of liver and intrahepatic bile duct: Secondary | ICD-10-CM

## 2022-11-03 NOTE — Telephone Encounter (Signed)
Pt called wanting to know if he's starting chemotherapy today.  Pt stated he was told he was getting a port and then start chemo.  Informed pt that he currently does not have appts scheduled but this nurse will notify his nurse navigator Gardiner Coins to have her to contact him.  Pt verbalized understanding and stated he would await Karen's call.

## 2022-11-03 NOTE — Progress Notes (Signed)
NGS testing ordered on liver biopsy via [facility] on line portal.  Order [id number].

## 2022-11-03 NOTE — Progress Notes (Signed)
I called Marvin Thomas as he had questions regarding treatment forwarded to me by Janett Billow,  RN.  I reviewed his appt to get his port placed.  I explained he need this before he can start chemotherapy and he will need chemo education class before starting chemo.   I explained my role as a nurse navigator and provided my contact information.  He knows to call me should he have any further questions.  All questions were answered.  He verbalized understanding.

## 2022-11-04 ENCOUNTER — Other Ambulatory Visit: Payer: Self-pay

## 2022-11-05 ENCOUNTER — Inpatient Hospital Stay: Payer: No Typology Code available for payment source

## 2022-11-05 ENCOUNTER — Other Ambulatory Visit: Payer: Self-pay

## 2022-11-05 VITALS — BP 120/70 | HR 62 | Temp 97.7°F | Resp 18

## 2022-11-05 DIAGNOSIS — D500 Iron deficiency anemia secondary to blood loss (chronic): Secondary | ICD-10-CM

## 2022-11-05 DIAGNOSIS — C180 Malignant neoplasm of cecum: Secondary | ICD-10-CM | POA: Diagnosis not present

## 2022-11-05 LAB — COMPREHENSIVE METABOLIC PANEL
ALT: 11 U/L (ref 0–44)
AST: 15 U/L (ref 15–41)
Albumin: 3.9 g/dL (ref 3.5–5.0)
Alkaline Phosphatase: 111 U/L (ref 38–126)
Anion gap: 5 (ref 5–15)
BUN: 20 mg/dL (ref 6–20)
CO2: 27 mmol/L (ref 22–32)
Calcium: 8.9 mg/dL (ref 8.9–10.3)
Chloride: 106 mmol/L (ref 98–111)
Creatinine, Ser: 1.07 mg/dL (ref 0.61–1.24)
GFR, Estimated: >60 mL/min (ref >60)
Glucose, Bld: 85 mg/dL (ref 70–99)
Potassium: 4.1 mmol/L (ref 3.5–5.1)
Sodium: 138 mmol/L (ref 135–145)
Total Bilirubin: 0.5 mg/dL (ref 0.3–1.2)
Total Protein: 7.3 g/dL (ref 6.5–8.1)

## 2022-11-05 LAB — CBC WITH DIFFERENTIAL/PLATELET
Abs Immature Granulocytes: 0.01 10*3/uL (ref 0.00–0.07)
Basophils Absolute: 0 10*3/uL (ref 0.0–0.1)
Basophils Relative: 1 %
Eosinophils Absolute: 0 10*3/uL (ref 0.0–0.5)
Eosinophils Relative: 1 %
HCT: 32.8 % — ABNORMAL LOW (ref 39.0–52.0)
Hemoglobin: 10.1 g/dL — ABNORMAL LOW (ref 13.0–17.0)
Immature Granulocytes: 0 %
Lymphocytes Relative: 18 %
Lymphs Abs: 0.9 10*3/uL (ref 0.7–4.0)
MCH: 23.5 pg — ABNORMAL LOW (ref 26.0–34.0)
MCHC: 30.8 g/dL (ref 30.0–36.0)
MCV: 76.5 fL — ABNORMAL LOW (ref 80.0–100.0)
Monocytes Absolute: 0.4 10*3/uL (ref 0.1–1.0)
Monocytes Relative: 8 %
Neutro Abs: 3.8 10*3/uL (ref 1.7–7.7)
Neutrophils Relative %: 72 %
Platelets: 379 10*3/uL (ref 150–400)
RBC: 4.29 MIL/uL (ref 4.22–5.81)
RDW: 21.6 % — ABNORMAL HIGH (ref 11.5–15.5)
WBC: 5.2 10*3/uL (ref 4.0–10.5)
nRBC: 0 % (ref 0.0–0.2)

## 2022-11-05 LAB — MISCELLANEOUS TEST

## 2022-11-05 LAB — FERRITIN: Ferritin: 63 ng/mL (ref 24–336)

## 2022-11-05 MED ORDER — SODIUM CHLORIDE 0.9 % IV SOLN
400.0000 mg | Freq: Once | INTRAVENOUS | Status: AC
  Administered 2022-11-05: 400 mg via INTRAVENOUS
  Filled 2022-11-05: qty 400

## 2022-11-05 MED ORDER — LORATADINE 10 MG PO TABS
10.0000 mg | ORAL_TABLET | Freq: Once | ORAL | Status: AC
  Administered 2022-11-05: 10 mg via ORAL
  Filled 2022-11-05: qty 1

## 2022-11-05 MED ORDER — SODIUM CHLORIDE 0.9 % IV SOLN: Freq: Once | INTRAVENOUS | Status: AC

## 2022-11-05 NOTE — Patient Instructions (Signed)
 Iron Sucrose Injection What is this medication? IRON SUCROSE (EYE ern SOO krose) treats low levels of iron (iron deficiency anemia) in people with kidney disease. Iron is a mineral that plays an important role in making red blood cells, which carry oxygen from your lungs to the rest of your body. This medicine may be used for other purposes; ask your health care provider or pharmacist if you have questions. COMMON BRAND NAME(S): Venofer What should I tell my care team before I take this medication? They need to know if you have any of these conditions: Anemia not caused by low iron levels Heart disease High levels of iron in the blood Kidney disease Liver disease An unusual or allergic reaction to iron, other medications, foods, dyes, or preservatives Pregnant or trying to get pregnant Breastfeeding How should I use this medication? This medication is for infusion into a vein. It is given in a hospital or clinic setting. Talk to your care team about the use of this medication in children. While this medication may be prescribed for children as young as 2 years for selected conditions, precautions do apply. Overdosage: If you think you have taken too much of this medicine contact a poison control center or emergency room at once. NOTE: This medicine is only for you. Do not share this medicine with others. What if I miss a dose? Keep appointments for follow-up doses. It is important not to miss your dose. Call your care team if you are unable to keep an appointment. What may interact with this medication? Do not take this medication with any of the following: Deferoxamine Dimercaprol Other iron products This medication may also interact with the following: Chloramphenicol Deferasirox This list may not describe all possible interactions. Give your health care provider a list of all the medicines, herbs, non-prescription drugs, or dietary supplements you use. Also tell them if you smoke,  drink alcohol, or use illegal drugs. Some items may interact with your medicine. What should I watch for while using this medication? Visit your care team regularly. Tell your care team if your symptoms do not start to get better or if they get worse. You may need blood work done while you are taking this medication. You may need to follow a special diet. Talk to your care team. Foods that contain iron include: whole grains/cereals, dried fruits, beans, or peas, leafy green vegetables, and organ meats (liver, kidney). What side effects may I notice from receiving this medication? Side effects that you should report to your care team as soon as possible: Allergic reactions--skin rash, itching, hives, swelling of the face, lips, tongue, or throat Low blood pressure--dizziness, feeling faint or lightheaded, blurry vision Shortness of breath Side effects that usually do not require medical attention (report to your care team if they continue or are bothersome): Flushing Headache Joint pain Muscle pain Nausea Pain, redness, or irritation at injection site This list may not describe all possible side effects. Call your doctor for medical advice about side effects. You may report side effects to FDA at 1-800-FDA-1088. Where should I keep my medication? This medication is given in a hospital or clinic. It will not be stored at home. NOTE: This sheet is a summary. It may not cover all possible information. If you have questions about this medicine, talk to your doctor, pharmacist, or health care provider.  2024 Elsevier/Gold Standard (2022-08-15 00:00:00)

## 2022-11-06 ENCOUNTER — Ambulatory Visit: Payer: Self-pay | Admitting: Orthopedic Surgery

## 2022-11-06 ENCOUNTER — Other Ambulatory Visit: Payer: Self-pay

## 2022-11-09 ENCOUNTER — Other Ambulatory Visit: Payer: Self-pay

## 2022-11-11 ENCOUNTER — Inpatient Hospital Stay: Payer: No Typology Code available for payment source

## 2022-11-11 ENCOUNTER — Other Ambulatory Visit: Payer: Self-pay | Admitting: Radiology

## 2022-11-11 NOTE — Progress Notes (Signed)
Pharmacist Chemotherapy Monitoring - Initial Assessment    Anticipated start date: 11/18/22   The following has been reviewed per standard work regarding the patient's treatment regimen: The patient's diagnosis, treatment plan and drug doses, and organ/hematologic function Lab orders and baseline tests specific to treatment regimen  The treatment plan start date, drug sequencing, and pre-medications Prior authorization status  Patient's documented medication list, including drug-drug interaction screen and prescriptions for anti-emetics and supportive care specific to the treatment regimen The drug concentrations, fluid compatibility, administration routes, and timing of the medications to be used The patient's access for treatment and lifetime cumulative dose history, if applicable  The patient's medication allergies and previous infusion related reactions, if applicable   Changes made to treatment plan:  N/A  Follow up needed:  N/A Self pay   Marvin Thomas, RPH, 11/11/2022  11:14 AM

## 2022-11-12 ENCOUNTER — Encounter: Payer: Self-pay | Admitting: Hematology

## 2022-11-12 ENCOUNTER — Ambulatory Visit (HOSPITAL_COMMUNITY)
Admission: RE | Admit: 2022-11-12 | Discharge: 2022-11-12 | Disposition: A | Discharge status: Home / Self Care | Payer: Medicaid Other | Source: Ambulatory Visit | Attending: Hematology | Admitting: Hematology

## 2022-11-12 ENCOUNTER — Other Ambulatory Visit: Payer: Self-pay | Admitting: *Deleted

## 2022-11-12 ENCOUNTER — Encounter (HOSPITAL_COMMUNITY): Payer: Self-pay

## 2022-11-12 DIAGNOSIS — K219 Gastro-esophageal reflux disease without esophagitis: Secondary | ICD-10-CM | POA: Diagnosis not present

## 2022-11-12 DIAGNOSIS — C787 Secondary malignant neoplasm of liver and intrahepatic bile duct: Secondary | ICD-10-CM | POA: Diagnosis not present

## 2022-11-12 DIAGNOSIS — J449 Chronic obstructive pulmonary disease, unspecified: Secondary | ICD-10-CM | POA: Insufficient documentation

## 2022-11-12 DIAGNOSIS — C189 Malignant neoplasm of colon, unspecified: Secondary | ICD-10-CM | POA: Insufficient documentation

## 2022-11-12 HISTORY — PX: IR IMAGING GUIDED PORT INSERTION: IMG5740

## 2022-11-12 MED ORDER — FENTANYL CITRATE (PF) 100 MCG/2ML IJ SOLN
INTRAMUSCULAR | Status: AC
  Filled 2022-11-12: qty 2

## 2022-11-12 MED ORDER — HEPARIN SOD (PORK) LOCK FLUSH 100 UNIT/ML IV SOLN
500.0000 [IU] | Freq: Once | INTRAVENOUS | Status: AC
  Administered 2022-11-12: 500 [IU] via INTRAVENOUS

## 2022-11-12 MED ORDER — MIDAZOLAM HCL 2 MG/2ML IJ SOLN
INTRAMUSCULAR | Status: AC | PRN
  Administered 2022-11-12 (×2): 1 mg via INTRAVENOUS

## 2022-11-12 MED ORDER — MIDAZOLAM HCL 2 MG/2ML IJ SOLN
INTRAMUSCULAR | Status: AC
  Filled 2022-11-12: qty 2

## 2022-11-12 MED ORDER — SODIUM CHLORIDE 0.9 % IV SOLN: INTRAVENOUS | Status: DC

## 2022-11-12 MED ORDER — HEPARIN SOD (PORK) LOCK FLUSH 100 UNIT/ML IV SOLN
INTRAVENOUS | Status: AC
  Filled 2022-11-12: qty 5

## 2022-11-12 MED ORDER — LIDOCAINE-EPINEPHRINE 1 %-1:100000 IJ SOLN
INTRAMUSCULAR | Status: AC
  Filled 2022-11-12: qty 1

## 2022-11-12 MED ORDER — FENTANYL CITRATE (PF) 100 MCG/2ML IJ SOLN
INTRAMUSCULAR | Status: AC | PRN
  Administered 2022-11-12 (×2): 50 ug via INTRAVENOUS

## 2022-11-12 MED ORDER — LIDOCAINE-EPINEPHRINE 1 %-1:100000 IJ SOLN
20.0000 mL | Freq: Once | INTRAMUSCULAR | Status: AC
  Administered 2022-11-12: 20 mL via INTRADERMAL

## 2022-11-12 NOTE — Discharge Instructions (Signed)
Implanted Port Insertion, Care After  The following information offers guidance on how to care for yourself after your procedure. Your health care provider may also give you more specific instructions. If you have problems or questions, contact your health care provider.  What can I expect after the procedure? After the procedure, it is common to have: Discomfort at the port insertion site. Bruising on the skin over the port. This should improve over 3-4 days.   Urgent needs - Interventional Radiology, clinic [phone] (mon-fri 8-5).   Wound - May remove dressing and shower in 24 to 48 hours.  Keep site clean and dry.  Replace with bandaid as needed.  Do not submerge in tub or water until site healing well. If closed with glue, glue will flake off on its own.   If ordered by your provider, may start Emla cream (or any other creams ointments or lotions) in 2 weeks or after incision is healed. Port is ready for use immediately.   After completion of treatment, your provider should have you set up for monthly port flushes.   Follow these instructions at home: Port care After your port is placed, you will get a manufacturer's information card. The card has information about your port. Keep this card with you at all times. Take care of the port as told by your health care provider. Ask your health care provider if you or a family member can get training for taking care of the port at home. A home health care nurse will be be available to help care for the port. Make sure to remember what type of port you have. Incision care     Follow instructions from your health care provider about how to take care of your port insertion site. Make sure you: Wash your hands with soap and water for at least 20 seconds before and after you change your bandage (dressing). If soap and water are not available, use hand sanitizer. Change your dressing as told by your health care provider. Leave stitches  (sutures), skin glue, or adhesive strips in place. These skin closures may need to stay in place for 2 weeks or longer. If adhesive strip edges start to loosen and curl up, you may trim the loose edges. Do not remove adhesive strips completely unless your health care provider tells you to do that. Check your port insertion site every day for signs of infection. Check for: Redness, swelling, or pain. Fluid or blood. Warmth. Pus or a bad smell. Activity Return to your normal activities as told by your health care provider. Ask your health care provider what activities are safe for you. You may have to avoid lifting. Ask your health care provider how much you can safely lift. General instructions Take over-the-counter and prescription medicines only as told by your health care provider. Do not take baths, swim, or use a hot tub until your health care provider approves. Ask your health care provider if you may take showers. You may only be allowed to take sponge baths. If you were given a sedative during the procedure, it can affect you for several hours. Do not drive or operate machinery until your health care provider says that it is safe. Wear a medical alert bracelet in case of an emergency. This will tell any health care providers that you have a port. Keep all follow-up visits. This is important. Contact a health care provider if: You cannot flush your port with saline as directed, or you cannot   draw blood from the port. You have a fever or chills. You have redness, swelling, or pain around your port insertion site. You have fluid or blood coming from your port insertion site. Your port insertion site feels warm to the touch. You have pus or a bad smell coming from the port insertion site. Get help right away if: You have chest pain or shortness of breath. You have bleeding from your port that you cannot control. These symptoms may be an emergency. Get help right away. Call 911. Do not  wait to see if the symptoms will go away. Do not drive yourself to the hospital. Summary Take care of the port as told by your health care provider. Keep the manufacturer's information card with you at all times. Change your dressing as told by your health care provider. Contact a health care provider if you have a fever or chills or if you have redness, swelling, or pain around your port insertion site. Keep all follow-up visits. This information is not intended to replace advice given to you by your health care provider. Make sure you discuss any questions you have with your health care provider. Document Revised: 09/11/2020 Document Reviewed: 09/11/2020 Elsevier Patient Education  2023 Elsevier Inc.    Moderate Conscious Sedation  Adult  Care After (English)  After the procedure, it is common to have: Sleepiness for a few hours. Impaired judgment for a few hours. Trouble with balance. Nausea or vomiting if you eat too soon. Follow these instructions at home: For the time period you were told by your health care provider:  Rest. Do not participate in activities where you could fall or become injured. Do not drive or use machinery. Do not drink alcohol. Do not take sleeping pills or medicines that cause drowsiness. Do not make important decisions or sign legal documents. Do not take care of children on your own. Eating and drinking Follow instructions from your health care provider about what you may eat and drink. Drink enough fluid to keep your urine pale yellow. If you vomit: Drink clear fluids slowly and in small amounts as you are able. Clear fluids include water, ice chips, low-calorie sports drinks, and fruit juice that has water added to it (diluted fruit juice). Eat light and bland foods in small amounts as you are able. These foods include bananas, applesauce, rice, lean meats, toast, and crackers. General instructions Take over-the-counter and prescription medicines  only as told by your health care provider. Have a responsible adult stay with you for the time you are told. Do not use any products that contain nicotine or tobacco. These products include cigarettes, chewing tobacco, and vaping devices, such as e-cigarettes. If you need help quitting, ask your health care provider. Return to your normal activities as told by your health care provider. Ask your health care provider what activities are safe for you. Your health care provider may give you more instructions. Make sure you know what you can and cannot do. Contact a health care provider if: You are still sleepy or having trouble with balance after 24 hours. You feel light-headed. You vomit every time you eat or drink. You get a rash. You have a fever. You have redness or swelling around the IV site. Get help right away if: You have trouble breathing. You start to feel confused at home. These symptoms may be an emergency. Get help right away. Call 911. Do not wait to see if the symptoms will go away. Do not   drive yourself to the hospital. This information is not intended to replace advice given to you by your health care provider. Make sure you discuss any questions you have with your health care provider. 

## 2022-11-12 NOTE — Procedures (Signed)
Pre Procedure Dx: Poor venous access Post Procedural Dx: Same  Successful placement of right IJ approach port-a-cath with tip at the superior caval atrial junction. The catheter is ready for immediate use.  Estimated Blood Loss: Trace  Complications: None immediate.  Jay Watts, MD Pager #: [phone]   

## 2022-11-12 NOTE — Progress Notes (Signed)
Called pt to introduce myself as his Dance movement psychotherapist and to discuss the [employer].  I wasn't able to able to leave a msg so I will try and speak to him when he comes for his next visit.

## 2022-11-12 NOTE — Progress Notes (Signed)
The proposed treatment discussed in conference is for discussion purpose only and is not a binding recommendation.  The patients have not been physically examined, or presented with their treatment options.  Therefore, final treatment plans cannot be decided.  

## 2022-11-12 NOTE — H&P (Signed)
Referring Physician(s): Feng,Yan  Supervising Physician: Simonne Come  Patient Status:  [facility]  Chief Complaint:  "I'm getting a port a cath"  Subjective: Pt known to IR team from right liver mass biopsy on 10/23/22. He is a 57 yo male smoker with PMH sig for anemia, COPD, GERD, PUD, substance abuse who presents now with newly diagnosed metastatic colon cancer . He is scheduled today for port a cath placement to assist with treatment. He denies fever, HA, CP,dyspnea, cough, abd/back pain,N/V or bleeding.    Past Medical History:  Diagnosis Date   Anemia Dx 2014   Gallstones    Hemothorax    Ulcer    Past Surgical History:  Procedure Laterality Date   BIOPSY  10/18/2022   Procedure: BIOPSY;  Surgeon: Kathi Der, MD;  Location: [facility];  Service: Gastroenterology;;   BIOPSY  10/24/2022   Procedure: BIOPSY;  Surgeon: Kerin Salen, MD;  Location: [facility];  Service: Gastroenterology;;   COLONOSCOPY WITH PROPOFOL N/A 10/18/2022   Procedure: COLONOSCOPY WITH PROPOFOL;  Surgeon: Kathi Der, MD;  Location: [facility];  Service: Gastroenterology;  Laterality: N/A;   COLONOSCOPY WITH PROPOFOL N/A 10/24/2022   Procedure: COLONOSCOPY WITH PROPOFOL;  Surgeon: Kerin Salen, MD;  Location: [facility];  Service: Gastroenterology;  Laterality: N/A;   ESOPHAGOGASTRODUODENOSCOPY (EGD) WITH PROPOFOL N/A 10/18/2022   Procedure: ESOPHAGOGASTRODUODENOSCOPY (EGD) WITH PROPOFOL;  Surgeon: Kathi Der, MD;  Location: [facility];  Service: Gastroenterology;  Laterality: N/A;   LAPAROTOMY N/A 12/09/2017   Procedure: EXPLORATORY LAPAROTOMY, REPAIR PERFORATED PYLEORIC ULCER;  Surgeon: Almond Lint, MD;  Location: [facility];  Service: General;  Laterality: N/A;   POLYPECTOMY  10/24/2022   Procedure: POLYPECTOMY;  Surgeon: Kerin Salen, MD;  Location: [facility];  Service: Gastroenterology;;   SUBMUCOSAL TATTOO INJECTION  10/18/2022   Procedure: SUBMUCOSAL TATTOO INJECTION;  Surgeon:  Kathi Der, MD;  Location: [facility];  Service: Gastroenterology;;      Allergies: Aspirin  Medications: Prior to Admission medications   Medication Sig Start Date End Date Taking? Authorizing Provider  acetaminophen (TYLENOL) 500 MG tablet Take 1,000 mg by mouth every 6 (six) hours as needed for mild pain or headache.    [provider]  BENADRYL ALLERGY 25 MG tablet Take 25-50 mg by mouth every 6 (six) hours as needed for sleep.    [provider]  cyanocobalamin 1000 MCG tablet Take 1 tablet (1,000 mcg total) by mouth daily. 10/25/22   Lewie Chamber, MD  lidocaine-prilocaine (EMLA) cream Apply to affected area once 10/31/22   [name], MD  ondansetron (ZOFRAN) 8 MG tablet Take 1 tablet (8 mg total) by mouth every 8 (eight) hours as needed for nausea or vomiting. Start on the third day after chemotherapy. 10/31/22   [name], MD  oxyCODONE (ROXICODONE) 5 MG immediate release tablet Take 1 tablet (5 mg total) by mouth every 4 (four) hours as needed for severe pain. 10/29/22   Dione Booze, MD  pantoprazole (PROTONIX) 40 MG tablet Take 1 tablet (40 mg total) by mouth daily. 10/24/22   Lewie Chamber, MD  pantoprazole (PROTONIX) 40 MG tablet Take 1 tablet (40 mg total) by mouth daily. 10/29/22   Dione Booze, MD  prochlorperazine (COMPAZINE) 10 MG tablet Take 1 tablet (10 mg total) by mouth every 6 (six) hours as needed for nausea or vomiting. 10/29/22   Dione Booze, MD  prochlorperazine (COMPAZINE) 10 MG tablet Take 1 tablet (10 mg total) by mouth every 6 (six) hours as  needed for nausea or vomiting. 10/31/22   [name], MD     Vital Signs: Vitals:   11/12/22 1230  BP: 138/79  Pulse: (!) 59  Resp: 18  Temp: 97.8 F (36.6 C)  SpO2: 100%      Code Status: FULL CODE  Physical Exam: awake/alert; chest- CTA bilat; heart- RRR; abd- soft,+BS, NT; no LE edema  Imaging: No results found.  Labs:  CBC: Recent Labs    10/23/22 0517 10/24/22 0507 10/29/22 0200  11/05/22 1146  WBC 6.9 6.5 6.4 5.2  HGB 8.6* 9.0* 9.1* 10.1*  HCT 28.2* 30.0* 30.8* 32.8*  PLT 263 281 388 379    COAGS: Recent Labs    10/20/22 0539 10/23/22 0517  INR 1.2 1.1    BMP: Recent Labs    10/21/22 0520 10/24/22 0507 10/29/22 0200 11/05/22 1146  NA 134* 137 138 138  K 3.6 4.1 3.7 4.1  CL 105 104 107 106  CO2 22 25 25 27   GLUCOSE 89 89 97 85  BUN 22* 20 26* 20  CALCIUM 8.0* 8.3* 8.8* 8.9  CREATININE 0.88 0.76 0.87 1.07  GFRNONAA >60 >60 >60 >60    LIVER FUNCTION TESTS: Recent Labs    10/16/22 1338 10/20/22 0539 10/29/22 0200 11/05/22 1146  BILITOT 0.8 0.5 0.2* 0.5  AST 17 14* 16 15  ALT 13 9 22 11   ALKPHOS 97 64 89 111  PROT 8.1 5.8* 6.7 7.3  ALBUMIN 4.1 2.8* 3.2* 3.9    Assessment and Plan: 57 yo male smoker with PMH sig for anemia, COPD, GERD, PUD, substance abuse who presents now with newly diagnosed metastatic colon cancer . He is scheduled today for port a cath placement to assist with treatment.Risks and benefits of image guided port-a-catheter placement was discussed with the patient including, but not limited to bleeding, infection, pneumothorax, or fibrin sheath development and need for additional procedures.  All of the patient's questions were answered, patient is agreeable to proceed. Consent signed and in chart.    Electronically Signed: D. Jeananne Rama, PA-C 11/12/2022, 12:33 PM   I spent a total of 25 minutes at the the patient's bedside AND on the patient's hospital floor or unit, greater than 50% of which was counseling/coordinating care for port a cath placement

## 2022-11-13 ENCOUNTER — Inpatient Hospital Stay: Payer: No Typology Code available for payment source

## 2022-11-13 ENCOUNTER — Encounter (HOSPITAL_COMMUNITY): Payer: Self-pay | Admitting: Hematology

## 2022-11-13 VITALS — BP 128/69 | HR 61 | Temp 97.7°F | Resp 16

## 2022-11-13 DIAGNOSIS — D500 Iron deficiency anemia secondary to blood loss (chronic): Secondary | ICD-10-CM

## 2022-11-13 DIAGNOSIS — C180 Malignant neoplasm of cecum: Secondary | ICD-10-CM | POA: Diagnosis not present

## 2022-11-13 MED ORDER — LORATADINE 10 MG PO TABS
10.0000 mg | ORAL_TABLET | Freq: Once | ORAL | Status: AC
  Administered 2022-11-13: 10 mg via ORAL
  Filled 2022-11-13: qty 1

## 2022-11-13 MED ORDER — METHYLPREDNISOLONE SODIUM SUCC 125 MG IJ SOLR: 125.0000 mg | Freq: Once | INTRAMUSCULAR | Status: DC | PRN

## 2022-11-13 MED ORDER — SODIUM CHLORIDE 0.9 % IV SOLN: Freq: Once | INTRAVENOUS | Status: AC

## 2022-11-13 MED ORDER — SODIUM CHLORIDE 0.9% FLUSH
10.0000 mL | Freq: Once | INTRAVENOUS | Status: AC | PRN
  Administered 2022-11-13: 10 mL

## 2022-11-13 MED ORDER — HEPARIN SOD (PORK) LOCK FLUSH 100 UNIT/ML IV SOLN
500.0000 [IU] | Freq: Once | INTRAVENOUS | Status: AC | PRN
  Administered 2022-11-13: 500 [IU]

## 2022-11-13 MED ORDER — ALBUTEROL SULFATE HFA 108 (90 BASE) MCG/ACT IN AERS: 2.0000 | INHALATION_SPRAY | Freq: Once | RESPIRATORY_TRACT | Status: DC | PRN

## 2022-11-13 MED ORDER — SODIUM CHLORIDE 0.9 % IV SOLN: Freq: Once | INTRAVENOUS | Status: DC | PRN

## 2022-11-13 MED ORDER — DIPHENHYDRAMINE HCL 50 MG/ML IJ SOLN: 50.0000 mg | Freq: Once | INTRAMUSCULAR | Status: DC | PRN

## 2022-11-13 MED ORDER — SODIUM CHLORIDE 0.9 % IV SOLN
400.0000 mg | Freq: Once | INTRAVENOUS | Status: AC
  Administered 2022-11-13: 400 mg via INTRAVENOUS
  Filled 2022-11-13: qty 400

## 2022-11-13 MED ORDER — FAMOTIDINE IN NACL 20-0.9 MG/50ML-% IV SOLN: 20.0000 mg | Freq: Once | INTRAVENOUS | Status: DC | PRN

## 2022-11-13 MED ORDER — EPINEPHRINE 0.3 MG/0.3ML IJ SOAJ: 0.3000 mg | Freq: Once | INTRAMUSCULAR | Status: DC | PRN

## 2022-11-13 NOTE — Progress Notes (Signed)
Patient observed for 30 minutes post Venofer infusioin. Tolerated treatment well, VSS at discharge. Patient ambulatory, discharged to lobby.

## 2022-11-13 NOTE — Patient Instructions (Signed)
 Iron Sucrose Injection What is this medication? IRON SUCROSE (EYE ern SOO krose) treats low levels of iron (iron deficiency anemia) in people with kidney disease. Iron is a mineral that plays an important role in making red blood cells, which carry oxygen from your lungs to the rest of your body. This medicine may be used for other purposes; ask your health care provider or pharmacist if you have questions. COMMON BRAND NAME(S): Venofer What should I tell my care team before I take this medication? They need to know if you have any of these conditions: Anemia not caused by low iron levels Heart disease High levels of iron in the blood Kidney disease Liver disease An unusual or allergic reaction to iron, other medications, foods, dyes, or preservatives Pregnant or trying to get pregnant Breastfeeding How should I use this medication? This medication is for infusion into a vein. It is given in a hospital or clinic setting. Talk to your care team about the use of this medication in children. While this medication may be prescribed for children as young as 2 years for selected conditions, precautions do apply. Overdosage: If you think you have taken too much of this medicine contact a poison control center or emergency room at once. NOTE: This medicine is only for you. Do not share this medicine with others. What if I miss a dose? Keep appointments for follow-up doses. It is important not to miss your dose. Call your care team if you are unable to keep an appointment. What may interact with this medication? Do not take this medication with any of the following: Deferoxamine Dimercaprol Other iron products This medication may also interact with the following: Chloramphenicol Deferasirox This list may not describe all possible interactions. Give your health care provider a list of all the medicines, herbs, non-prescription drugs, or dietary supplements you use. Also tell them if you smoke,  drink alcohol, or use illegal drugs. Some items may interact with your medicine. What should I watch for while using this medication? Visit your care team regularly. Tell your care team if your symptoms do not start to get better or if they get worse. You may need blood work done while you are taking this medication. You may need to follow a special diet. Talk to your care team. Foods that contain iron include: whole grains/cereals, dried fruits, beans, or peas, leafy green vegetables, and organ meats (liver, kidney). What side effects may I notice from receiving this medication? Side effects that you should report to your care team as soon as possible: Allergic reactions--skin rash, itching, hives, swelling of the face, lips, tongue, or throat Low blood pressure--dizziness, feeling faint or lightheaded, blurry vision Shortness of breath Side effects that usually do not require medical attention (report to your care team if they continue or are bothersome): Flushing Headache Joint pain Muscle pain Nausea Pain, redness, or irritation at injection site This list may not describe all possible side effects. Call your doctor for medical advice about side effects. You may report side effects to FDA at 1-800-FDA-1088. Where should I keep my medication? This medication is given in a hospital or clinic. It will not be stored at home. NOTE: This sheet is a summary. It may not cover all possible information. If you have questions about this medicine, talk to your doctor, pharmacist, or health care provider.  2024 Elsevier/Gold Standard (2022-08-15 00:00:00)

## 2022-11-17 DIAGNOSIS — G893 Neoplasm related pain (acute) (chronic): Secondary | ICD-10-CM | POA: Insufficient documentation

## 2022-11-17 MED FILL — Dexamethasone Sodium Phosphate Inj 100 MG/10ML: INTRAMUSCULAR | Qty: 1 | Status: AC

## 2022-11-17 NOTE — Assessment & Plan Note (Signed)
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  His workup in the hospital reviewed metastatic colon cancer to liver and lung.  I reviewed his lab, biopsy results, images, and multiple endoscopy reports, and discussed the findings with patient and his sister in detail. -Although his biopsy of the cecal mass was negative for malignant cells, his liver biopsy confirmed adenocarcinoma and immunostain will consistent with colorectal primary.  Based on the colonoscopy findings, and severe iron deficiency, this is consistent with metastatic colon cancer -I recommend systemic therapy with FOLFOX and beva, plan to start today.  I again reviewed potential side effects and management with patient, he agrees to proceed. -The goal of chemotherapy is palliative, to prolong his life and improve cancer related symptoms. -I also reviewed the marked testing results with patient, which showed MSI stable disease, K-ras mutation positive.  Due to the primary right-sided colon cancer, I will not use EGFR inhibitor in the first line setting. -He is not a candidate for immunotherapy as first-line, but he does have high tumor mutation burden, and on the immunotherapy in the later line setting.

## 2022-11-17 NOTE — Assessment & Plan Note (Signed)
-  Secondary to his colon cancer, he received a blood transfusion in the hospital. -I recommend IV iron, he has no insurance, will try to get free IV Venofer for him.  Will start as soon as we have the medicine

## 2022-11-17 NOTE — Assessment & Plan Note (Signed)
--  Likely related to the diffuse liver metastasis  -Continue oxycodone as needed -He has appointment with palliative care later this week.

## 2022-11-18 ENCOUNTER — Encounter: Payer: Self-pay | Admitting: Hematology

## 2022-11-18 ENCOUNTER — Inpatient Hospital Stay: Payer: No Typology Code available for payment source

## 2022-11-18 ENCOUNTER — Inpatient Hospital Stay: Payer: No Typology Code available for payment source | Admitting: Licensed Clinical Social Worker

## 2022-11-18 ENCOUNTER — Inpatient Hospital Stay (HOSPITAL_BASED_OUTPATIENT_CLINIC_OR_DEPARTMENT_OTHER): Payer: No Typology Code available for payment source | Admitting: Hematology

## 2022-11-18 VITALS — BP 122/69 | HR 68 | Temp 97.5°F | Resp 18 | Ht 69.0 in | Wt 138.1 lb

## 2022-11-18 VITALS — BP 131/63 | HR 64 | Temp 98.1°F | Resp 20

## 2022-11-18 DIAGNOSIS — C189 Malignant neoplasm of colon, unspecified: Secondary | ICD-10-CM

## 2022-11-18 DIAGNOSIS — D500 Iron deficiency anemia secondary to blood loss (chronic): Secondary | ICD-10-CM

## 2022-11-18 DIAGNOSIS — G893 Neoplasm related pain (acute) (chronic): Secondary | ICD-10-CM

## 2022-11-18 DIAGNOSIS — C787 Secondary malignant neoplasm of liver and intrahepatic bile duct: Secondary | ICD-10-CM | POA: Diagnosis not present

## 2022-11-18 DIAGNOSIS — C180 Malignant neoplasm of cecum: Secondary | ICD-10-CM | POA: Diagnosis not present

## 2022-11-18 LAB — CBC WITH DIFFERENTIAL (CANCER CENTER ONLY)
Abs Immature Granulocytes: 0.01 10*3/uL (ref 0.00–0.07)
Basophils Absolute: 0 10*3/uL (ref 0.0–0.1)
Basophils Relative: 1 %
Eosinophils Absolute: 0.1 10*3/uL (ref 0.0–0.5)
Eosinophils Relative: 1 %
HCT: 30.9 % — ABNORMAL LOW (ref 39.0–52.0)
Hemoglobin: 9.7 g/dL — ABNORMAL LOW (ref 13.0–17.0)
Immature Granulocytes: 0 %
Lymphocytes Relative: 16 %
Lymphs Abs: 0.9 10*3/uL (ref 0.7–4.0)
MCH: 24.6 pg — ABNORMAL LOW (ref 26.0–34.0)
MCHC: 31.4 g/dL (ref 30.0–36.0)
MCV: 78.2 fL — ABNORMAL LOW (ref 80.0–100.0)
Monocytes Absolute: 0.8 10*3/uL (ref 0.1–1.0)
Monocytes Relative: 14 %
Neutro Abs: 3.8 10*3/uL (ref 1.7–7.7)
Neutrophils Relative %: 68 %
Platelet Count: 206 10*3/uL (ref 150–400)
RBC: 3.95 MIL/uL — ABNORMAL LOW (ref 4.22–5.81)
RDW: 20.6 % — ABNORMAL HIGH (ref 11.5–15.5)
WBC Count: 5.6 10*3/uL (ref 4.0–10.5)
nRBC: 0 % (ref 0.0–0.2)

## 2022-11-18 LAB — CMP (CANCER CENTER ONLY)
ALT: 10 U/L (ref 0–44)
AST: 15 U/L (ref 15–41)
Albumin: 3.5 g/dL (ref 3.5–5.0)
Alkaline Phosphatase: 93 U/L (ref 38–126)
Anion gap: 3 — ABNORMAL LOW (ref 5–15)
BUN: 24 mg/dL — ABNORMAL HIGH (ref 6–20)
CO2: 27 mmol/L (ref 22–32)
Calcium: 9 mg/dL (ref 8.9–10.3)
Chloride: 112 mmol/L — ABNORMAL HIGH (ref 98–111)
Creatinine: 0.84 mg/dL (ref 0.61–1.24)
GFR, Estimated: >60 mL/min (ref >60)
Glucose, Bld: 95 mg/dL (ref 70–99)
Potassium: 4.3 mmol/L (ref 3.5–5.1)
Sodium: 142 mmol/L (ref 135–145)
Total Bilirubin: 0.3 mg/dL (ref 0.3–1.2)
Total Protein: 6.5 g/dL (ref 6.5–8.1)

## 2022-11-18 LAB — TOTAL PROTEIN, URINE DIPSTICK: Protein, ur: NEGATIVE mg/dL

## 2022-11-18 MED ORDER — SODIUM CHLORIDE 0.9 % IV SOLN
10.0000 mg | Freq: Once | INTRAVENOUS | Status: AC
  Administered 2022-11-18: 10 mg via INTRAVENOUS
  Filled 2022-11-18: qty 10

## 2022-11-18 MED ORDER — LEUCOVORIN CALCIUM INJECTION 350 MG
400.0000 mg/m2 | Freq: Once | INTRAVENOUS | Status: AC
  Administered 2022-11-18: 696 mg via INTRAVENOUS
  Filled 2022-11-18: qty 34.8

## 2022-11-18 MED ORDER — DEXTROSE 5 % IV SOLN: Freq: Once | INTRAVENOUS | Status: AC

## 2022-11-18 MED ORDER — SODIUM CHLORIDE 0.9 % IV SOLN
2400.0000 mg/m2 | INTRAVENOUS | Status: DC
  Administered 2022-11-18: 4200 mg via INTRAVENOUS
  Filled 2022-11-18: qty 84

## 2022-11-18 MED ORDER — SODIUM CHLORIDE 0.9% FLUSH: 10.0000 mL | INTRAVENOUS | Status: DC | PRN

## 2022-11-18 MED ORDER — HEPARIN SOD (PORK) LOCK FLUSH 100 UNIT/ML IV SOLN: 500.0000 [IU] | Freq: Once | INTRAVENOUS | Status: DC | PRN

## 2022-11-18 MED ORDER — PALONOSETRON HCL INJECTION 0.25 MG/5ML
0.2500 mg | Freq: Once | INTRAVENOUS | Status: AC
  Administered 2022-11-18: 0.25 mg via INTRAVENOUS
  Filled 2022-11-18: qty 5

## 2022-11-18 MED ORDER — SODIUM CHLORIDE 0.9% FLUSH
10.0000 mL | Freq: Once | INTRAVENOUS | Status: AC | PRN
  Administered 2022-11-18: 10 mL

## 2022-11-18 MED ORDER — OXALIPLATIN CHEMO INJECTION 100 MG/20ML
85.0000 mg/m2 | Freq: Once | INTRAVENOUS | Status: AC
  Administered 2022-11-18: 150 mg via INTRAVENOUS
  Filled 2022-11-18: qty 10

## 2022-11-18 NOTE — Progress Notes (Signed)
[facility] CSW Progress Note  Visual merchandiser met with patient in infusion to check progress with applying for Medicaid.  Pt reports he has not yet started the application process for Medicaid yet.  CSW provided pt w/ contact information for the DSS outstation again to book an appointment to complete Medicaid application.  Pt also provided again with contact information for the patient financial resource specialist to apply for the [employer].      Rachel Moulds, LCSW Clinical Social Worker [facility]

## 2022-11-18 NOTE — Patient Instructions (Signed)
[facility]  Discharge Instructions: Thank you for choosing [facility] to provide your oncology and hematology care.   If you have a lab appointment with the [facility], please go directly to the [facility] and check in at the registration area.   Wear comfortable clothing and clothing appropriate for easy access to any Portacath or PICC line.   We strive to give you quality time with your provider. You may need to reschedule your appointment if you arrive late (15 or more minutes).  Arriving late affects you and other patients whose appointments are after yours.  Also, if you miss three or more appointments without notifying the office, you may be dismissed from the clinic at the provider's discretion.      For prescription refill requests, have your pharmacy contact our office and allow 72 hours for refills to be completed.    Today you received the following chemotherapy and/or immunotherapy agents: Oxaliplatin, Leucovorin, and Fluorouracil   To help prevent nausea and vomiting after your treatment, we encourage you to take your nausea medication as directed.  BELOW ARE SYMPTOMS THAT SHOULD BE REPORTED IMMEDIATELY: *FEVER GREATER THAN 100.4 F (38 C) OR HIGHER *CHILLS OR SWEATING *NAUSEA AND VOMITING THAT IS NOT CONTROLLED WITH YOUR NAUSEA MEDICATION *UNUSUAL SHORTNESS OF BREATH *UNUSUAL BRUISING OR BLEEDING *URINARY PROBLEMS (pain or burning when urinating, or frequent urination) *BOWEL PROBLEMS (unusual diarrhea, constipation, pain near the anus) TENDERNESS IN MOUTH AND THROAT WITH OR WITHOUT PRESENCE OF ULCERS (sore throat, sores in mouth, or a toothache) UNUSUAL RASH, SWELLING OR PAIN  UNUSUAL VAGINAL DISCHARGE OR ITCHING   Items with * indicate a potential emergency and should be followed up as soon as possible or go to the Emergency Department if any problems should occur.  Please show the CHEMOTHERAPY ALERT CARD or  IMMUNOTHERAPY ALERT CARD at check-in to the Emergency Department and triage nurse.  Should you have questions after your visit or need to cancel or reschedule your appointment, please contact [facility]  Dept: [phone]  and follow the prompts.  Office hours are 8:00 a.m. to 4:30 p.m. Monday - Friday. Please note that voicemails left after 4:00 p.m. may not be returned until the following business day.  We are closed weekends and major holidays. You have access to a nurse at all times for urgent questions. Please call the main number to the clinic Dept: [phone] and follow the prompts.   For any non-urgent questions, you may also contact your provider using MyChart. We now offer e-Visits for anyone 52 and older to request care online for non-urgent symptoms. For details visit [email or web address].   Also download the MyChart app! Go to the app store, search "MyChart", open the app, select [facility], and log in with your MyChart username and password.  The chemotherapy medication bag should finish at 46 hours, 96 hours, or 7 days. For example, if your pump is scheduled for 46 hours and it was put on at 4:00 p.m., it should finish at 2:00 p.m. the day it is scheduled to come off regardless of your appointment time.     Estimated time to finish at:   If the display on your pump reads "Low Volume" and it is beeping, take the batteries out of the pump and come to the [facility] for it to be taken off.   If the pump alarms go off prior to the pump  reading "Low Volume" then call [phone] and someone can assist you.  If the plunger comes out and the chemotherapy medication is leaking out, please use your home chemo spill kit to clean up the spill. Do NOT use paper towels or other household products.  If you have problems or questions regarding your pump, please call either [phone] (24 hours a day) or the [facility] Monday-Friday 8:00  a.m.- 4:30 p.m. at the clinic number and we will assist you. If you are unable to get assistance, then go to the nearest Emergency Department and ask the staff to contact the IV team for assistance.   Oxaliplatin Injection What is this medication? OXALIPLATIN (ox AL i PLA tin) treats colorectal cancer. It works by slowing down the growth of cancer cells. This medicine may be used for other purposes; ask your health care provider or pharmacist if you have questions. COMMON BRAND NAME(S): Eloxatin What should I tell my care team before I take this medication? They need to know if you have any of these conditions: Heart disease History of irregular heartbeat or rhythm Liver disease Low blood cell levels (white cells, red cells, and platelets) Lung or breathing disease, such as asthma Take medications that treat or prevent blood clots Tingling of the fingers, toes, or other nerve disorder An unusual or allergic reaction to oxaliplatin, other medications, foods, dyes, or preservatives If you or your partner are pregnant or trying to get pregnant Breast-feeding How should I use this medication? This medication is injected into a vein. It is given by your care team in a hospital or clinic setting. Talk to your care team about the use of this medication in children. Special care may be needed. Overdosage: If you think you have taken too much of this medicine contact a poison control [facility] or emergency room at once. NOTE: This medicine is only for you. Do not share this medicine with others. What if I miss a dose? Keep appointments for follow-up doses. It is important not to miss a dose. Call your care team if you are unable to keep an appointment. What may interact with this medication? Do not take this medication with any of the following: Cisapride Dronedarone Pimozide Thioridazine This medication may also interact with the following: Aspirin and aspirin-like medications Certain  medications that treat or prevent blood clots, such as warfarin, apixaban, dabigatran, and rivaroxaban Cisplatin Cyclosporine Diuretics Medications for infection, such as acyclovir, adefovir, amphotericin B, bacitracin, cidofovir, foscarnet, ganciclovir, gentamicin, pentamidine, vancomycin NSAIDs, medications for pain and inflammation, such as ibuprofen or naproxen Other medications that cause heart rhythm changes Pamidronate Zoledronic acid This list may not describe all possible interactions. Give your health care provider a list of all the medicines, herbs, non-prescription drugs, or dietary supplements you use. Also tell them if you smoke, drink alcohol, or use illegal drugs. Some items may interact with your medicine. What should I watch for while using this medication? Your condition will be monitored carefully while you are receiving this medication. You may need blood work while taking this medication. This medication may make you feel generally unwell. This is not uncommon as chemotherapy can affect healthy cells as well as cancer cells. Report any side effects. Continue your course of treatment even though you feel ill unless your care team tells you to stop. This medication may increase your risk of getting an infection. Call your care team for advice if you get a fever, chills, sore throat, or other symptoms of a cold  or flu. Do not treat yourself. Try to avoid being around people who are sick. Avoid taking medications that contain aspirin, acetaminophen, ibuprofen, naproxen, or ketoprofen unless instructed by your care team. These medications may hide a fever. Be careful brushing or flossing your teeth or using a toothpick because you may get an infection or bleed more easily. If you have any dental work done, tell your dentist you are receiving this medication. This medication can make you more sensitive to cold. Do not drink cold drinks or use ice. Cover exposed skin before coming in  contact with cold temperatures or cold objects. When out in cold weather wear warm clothing and cover your mouth and nose to warm the air that goes into your lungs. Tell your care team if you get sensitive to the cold. Talk to your care team if you or your partner are pregnant or think either of you might be pregnant. This medication can cause serious birth defects if taken during pregnancy and for 9 months after the last dose. A negative pregnancy test is required before starting this medication. A reliable form of contraception is recommended while taking this medication and for 9 months after the last dose. Talk to your care team about effective forms of contraception. Do not father a child while taking this medication and for 6 months after the last dose. Use a condom while having sex during this time period. Do not breastfeed while taking this medication and for 3 months after the last dose. This medication may cause infertility. Talk to your care team if you are concerned about your fertility. What side effects may I notice from receiving this medication? Side effects that you should report to your care team as soon as possible: Allergic reactions--skin rash, itching, hives, swelling of the face, lips, tongue, or throat Bleeding--bloody or black, tar-like stools, vomiting blood or brown material that looks like coffee grounds, red or dark brown urine, small red or purple spots on skin, unusual bruising or bleeding Dry cough, shortness of breath or trouble breathing Heart rhythm changes--fast or irregular heartbeat, dizziness, feeling faint or lightheaded, chest pain, trouble breathing Infection--fever, chills, cough, sore throat, wounds that don't heal, pain or trouble when passing urine, general feeling of discomfort or being unwell Liver injury--right upper belly pain, loss of appetite, nausea, light-colored stool, dark yellow or brown urine, yellowing skin or eyes, unusual weakness or  fatigue Low red blood cell level--unusual weakness or fatigue, dizziness, headache, trouble breathing Muscle injury--unusual weakness or fatigue, muscle pain, dark yellow or brown urine, decrease in amount of urine Pain, tingling, or numbness in the hands or feet Sudden and severe headache, confusion, change in vision, seizures, which may be signs of posterior reversible encephalopathy syndrome (PRES) Unusual bruising or bleeding Side effects that usually do not require medical attention (report to your care team if they continue or are bothersome): Diarrhea Nausea Pain, redness, or swelling with sores inside the mouth or throat Unusual weakness or fatigue Vomiting This list may not describe all possible side effects. Call your doctor for medical advice about side effects. You may report side effects to FDA at 1-800-FDA-1088. Where should I keep my medication? This medication is given in a hospital or clinic. It will not be stored at home. NOTE: This sheet is a summary. It may not cover all possible information. If you have questions about this medicine, talk to your doctor, pharmacist, or health care provider.  2024 Elsevier/Gold Standard (2021-12-24 00:00:00) Leucovorin Injection What  is this medication? LEUCOVORIN (loo koe VOR in) prevents side effects from certain medications, such as methotrexate. It works by increasing folate levels. This helps protect healthy cells in your body. It may also be used to treat anemia caused by low levels of folate. It can also be used with fluorouracil, a type of chemotherapy, to treat colorectal cancer. It works by increasing the effects of fluorouracil in the body. This medicine may be used for other purposes; ask your health care provider or pharmacist if you have questions. What should I tell my care team before I take this medication? They need to know if you have any of these conditions: Anemia from low levels of vitamin B12 in the blood An unusual  or allergic reaction to leucovorin, folic acid, other medications, foods, dyes, or preservatives Pregnant or trying to get pregnant Breastfeeding How should I use this medication? This medication is injected into a vein or a muscle. It is given by your care team in a hospital or clinic setting. Talk to your care team about the use of this medication in children. Special care may be needed. Overdosage: If you think you have taken too much of this medicine contact a poison control [facility] or emergency room at once. NOTE: This medicine is only for you. Do not share this medicine with others. What if I miss a dose? Keep appointments for follow-up doses. It is important not to miss your dose. Call your care team if you are unable to keep an appointment. What may interact with this medication? Capecitabine Fluorouracil Phenobarbital Phenytoin Primidone Trimethoprim;sulfamethoxazole This list may not describe all possible interactions. Give your health care provider a list of all the medicines, herbs, non-prescription drugs, or dietary supplements you use. Also tell them if you smoke, drink alcohol, or use illegal drugs. Some items may interact with your medicine. What should I watch for while using this medication? Your condition will be monitored carefully while you are receiving this medication. This medication may increase the side effects of 5-fluorouracil. Tell your care team if you have diarrhea or mouth sores that do not get better or that get worse. What side effects may I notice from receiving this medication? Side effects that you should report to your care team as soon as possible: Allergic reactions--skin rash, itching, hives, swelling of the face, lips, tongue, or throat This list may not describe all possible side effects. Call your doctor for medical advice about side effects. You may report side effects to FDA at 1-800-FDA-1088. Where should I keep my medication? This medication is  given in a hospital or clinic. It will not be stored at home. NOTE: This sheet is a summary. It may not cover all possible information. If you have questions about this medicine, talk to your doctor, pharmacist, or health care provider.  2024 Elsevier/Gold Standard (2021-08-13 00:00:00) Fluorouracil Injection What is this medication? FLUOROURACIL (flure oh YOOR a sil) treats some types of cancer. It works by slowing down the growth of cancer cells. This medicine may be used for other purposes; ask your health care provider or pharmacist if you have questions. COMMON BRAND NAME(S): Adrucil What should I tell my care team before I take this medication? They need to know if you have any of these conditions: Blood disorders Dihydropyrimidine dehydrogenase (DPD) deficiency Infection, such as chickenpox, cold sores, herpes Kidney disease Liver disease Poor nutrition Recent or ongoing radiation therapy An unusual or allergic reaction to fluorouracil, other medications, foods, dyes, or preservatives  If you or your partner are pregnant or trying to get pregnant Breast-feeding How should I use this medication? This medication is injected into a vein. It is administered by your care team in a hospital or clinic setting. Talk to your care team about the use of this medication in children. Special care may be needed. Overdosage: If you think you have taken too much of this medicine contact a poison control [facility] or emergency room at once. NOTE: This medicine is only for you. Do not share this medicine with others. What if I miss a dose? Keep appointments for follow-up doses. It is important not to miss your dose. Call your care team if you are unable to keep an appointment. What may interact with this medication? Do not take this medication with any of the following: Live virus vaccines This medication may also interact with the following: Medications that treat or prevent blood clots, such as  warfarin, enoxaparin, dalteparin This list may not describe all possible interactions. Give your health care provider a list of all the medicines, herbs, non-prescription drugs, or dietary supplements you use. Also tell them if you smoke, drink alcohol, or use illegal drugs. Some items may interact with your medicine. What should I watch for while using this medication? Your condition will be monitored carefully while you are receiving this medication. This medication may make you feel generally unwell. This is not uncommon as chemotherapy can affect healthy cells as well as cancer cells. Report any side effects. Continue your course of treatment even though you feel ill unless your care team tells you to stop. In some cases, you may be given additional medications to help with side effects. Follow all directions for their use. This medication may increase your risk of getting an infection. Call your care team for advice if you get a fever, chills, sore throat, or other symptoms of a cold or flu. Do not treat yourself. Try to avoid being around people who are sick. This medication may increase your risk to bruise or bleed. Call your care team if you notice any unusual bleeding. Be careful brushing or flossing your teeth or using a toothpick because you may get an infection or bleed more easily. If you have any dental work done, tell your dentist you are receiving this medication. Avoid taking medications that contain aspirin, acetaminophen, ibuprofen, naproxen, or ketoprofen unless instructed by your care team. These medications may hide a fever. Do not treat diarrhea with over the counter products. Contact your care team if you have diarrhea that lasts more than 2 days or if it is severe and watery. This medication can make you more sensitive to the sun. Keep out of the sun. If you cannot avoid being in the sun, wear protective clothing and sunscreen. Do not use sun lamps, tanning beds, or tanning  booths. Talk to your care team if you or your partner wish to become pregnant or think you might be pregnant. This medication can cause serious birth defects if taken during pregnancy and for 3 months after the last dose. A reliable form of contraception is recommended while taking this medication and for 3 months after the last dose. Talk to your care team about effective forms of contraception. Do not father a child while taking this medication and for 3 months after the last dose. Use a condom while having sex during this time period. Do not breastfeed while taking this medication. This medication may cause infertility. Talk to your care  team if you are concerned about your fertility. What side effects may I notice from receiving this medication? Side effects that you should report to your care team as soon as possible: Allergic reactions--skin rash, itching, hives, swelling of the face, lips, tongue, or throat Heart attack--pain or tightness in the chest, shoulders, arms, or jaw, nausea, shortness of breath, cold or clammy skin, feeling faint or lightheaded Heart failure--shortness of breath, swelling of the ankles, feet, or hands, sudden weight gain, unusual weakness or fatigue Heart rhythm changes--fast or irregular heartbeat, dizziness, feeling faint or lightheaded, chest pain, trouble breathing High ammonia level--unusual weakness or fatigue, confusion, loss of appetite, nausea, vomiting, seizures Infection--fever, chills, cough, sore throat, wounds that don't heal, pain or trouble when passing urine, general feeling of discomfort or being unwell Low red blood cell level--unusual weakness or fatigue, dizziness, headache, trouble breathing Pain, tingling, or numbness in the hands or feet, muscle weakness, change in vision, confusion or trouble speaking, loss of balance or coordination, trouble walking, seizures Redness, swelling, and blistering of the skin over hands and feet Severe or  prolonged diarrhea Unusual bruising or bleeding Side effects that usually do not require medical attention (report to your care team if they continue or are bothersome): Dry skin Headache Increased tears Nausea Pain, redness, or swelling with sores inside the mouth or throat Sensitivity to light Vomiting This list may not describe all possible side effects. Call your doctor for medical advice about side effects. You may report side effects to FDA at 1-800-FDA-1088. Where should I keep my medication? This medication is given in a hospital or clinic. It will not be stored at home. NOTE: This sheet is a summary. It may not cover all possible information. If you have questions about this medicine, talk to your doctor, pharmacist, or health care provider.  2024 Elsevier/Gold Standard (2021-07-16 00:00:00)

## 2022-11-18 NOTE — Progress Notes (Signed)
Pt is approved for the $1000 [employer] grant.  

## 2022-11-18 NOTE — Progress Notes (Signed)
[facility]   [phone] [phone]   Clinic Follow up Note   Patient Care Team: Pcp, No as PCP - General Patient, No Pcp Per (General Practice) [name], MD as Consulting Physician (Hematology and Oncology)  Date of Service:  11/18/2022  CHIEF COMPLAINT: f/u of Metastatic colon cancer to liver   CURRENT THERAPY:  FOLOFOX + Bevaacizumab q14d  ASSESSMENT:  Marvin Thomas is a 57 y.o. 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  His workup in the hospital reviewed metastatic colon cancer to liver and lung.  I reviewed his lab, biopsy results, images, and multiple endoscopy reports, and discussed the findings with patient and his sister in detail. -Although his biopsy of the cecal mass was negative for malignant cells, his liver biopsy confirmed adenocarcinoma and immunostain will consistent with colorectal primary.  Based on the colonoscopy findings, and severe iron deficiency, this is consistent with metastatic colon cancer -I recommend systemic therapy with FOLFOX and beva, plan to start today.  I again reviewed potential side effects and management with patient, he agrees to proceed. -The goal of chemotherapy is palliative, to prolong his life and improve cancer related symptoms. -I also reviewed the marked testing results with patient, which showed MSI stable disease, K-ras mutation positive.  Due to the primary right-sided colon cancer, I will not use EGFR inhibitor in the first line setting. -He is not a candidate for immunotherapy as first-line, but he does have high tumor mutation burden, and on the immunotherapy in the later line setting.   Iron deficiency anemia due to chronic blood loss -Secondary to his colon cancer, he received a blood transfusion in the hospital. -I  recommend IV iron, he has no insurance, will try to get free IV Venofer for him.  Will start as soon as we have the medicine    Cancer related pain --Likely related to the diffuse liver metastasis  -Continue oxycodone as needed -He has appointment with palliative care later this week.     PLAN: -lab reviewed -U/A-negative -proceed with FOLFOX first time -lab/flush and f/u in 9/10 before cycle 2    SUMMARY OF ONCOLOGIC HISTORY: Oncology History Overview Note   Cancer Staging  Metastatic colon cancer to liver [facility]) Staging form: Colon and Rectum, AJCC 8th Edition - Clinical stage from 10/24/2022: Stage IVB (cTX, cN0, pM1b) - Signed by [name], MD on 10/31/2022 Total positive nodes: 0     Metastatic colon cancer to liver (HCC)  10/17/2022 Imaging   CT CHEST W CONTRAST   IMPRESSION: 3 cm mass in the posterior right upper lobe with some dystrophic calcification. In addition there is an abnormal precarinal lymph node. Malignancy is possible. Recommend further workup such as PET-CT scan when clinically appropriate.   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 The previous larger bulla in the right lung apex is not seen on the current study.   Coronary artery calcifications. Please correlate for other coronary risk factors.   Aortic Atherosclerosis (ICD10-I70.0) and Emphysema (ICD10-J43.9).     10/24/2022 Cancer Staging   Staging form: Colon and Rectum, AJCC 8th Edition - Clinical stage from 10/24/2022: Stage IVB (cTX, cN0, pM1b) - Signed by [name], MD on 10/31/2022 Total positive nodes: 0   10/29/2022 Imaging   C ABDOMEN PELVIS W  CONTRAST   IMPRESSION: 1. 3.3 x 3.2 x 2.6 cm rounded masslike wall abnormality in the mid transverse colon just left of the midline, suspected to be a primary tumor. This fills most of luminal volume but there is no evidence of an upstream mechanical obstruction. 2. Four metastatic liver masses are again noted, with the  largest lesion in segment 8 measuring 5.1 cm, previously 4.3 cm. 3. Mesenteric edema which could be congestive, due to malnutrition or hepatic dysfunction. Some of this is seen adjacent to the pancreas. It is possible the patient could have pancreatitis but no peripancreatic fluid or ductal dilatation is seen. Correlate with serum lipase. 4. Diverticulosis and moderate fecal stasis. 5. Aortic atherosclerosis.   10/31/2022 Initial Diagnosis   Metastatic colon cancer to liver (HCC)   11/06/2022 Miscellaneous      11/18/2022 -  Chemotherapy   Patient is on Treatment Plan : COLORECTAL FOLFOX + Bevacizumab q14d        INTERVAL HISTORY:  Marvin Thomas is here for a follow up of Metastatic colon cancer to liver. He was last seen by me on 10/31/2022. He presents to the clinic alone. Pt state that he feels fine. Pt state that he has no abdominal issues or BM issues.     All other systems were reviewed with the patient and are negative.  MEDICAL HISTORY:  Past Medical History:  Diagnosis Date   Anemia Dx 2014   Gallstones    Hemothorax    Ulcer     SURGICAL HISTORY: Past Surgical History:  Procedure Laterality Date   BIOPSY  10/18/2022   Procedure: BIOPSY;  Surgeon: Kathi Der, MD;  Location: [facility];  Service: Gastroenterology;;   BIOPSY  10/24/2022   Procedure: BIOPSY;  Surgeon: Kerin Salen, MD;  Location: [facility];  Service: Gastroenterology;;   COLONOSCOPY WITH PROPOFOL N/A 10/18/2022   Procedure: COLONOSCOPY WITH PROPOFOL;  Surgeon: Kathi Der, MD;  Location: [facility];  Service: Gastroenterology;  Laterality: N/A;   COLONOSCOPY WITH PROPOFOL N/A 10/24/2022   Procedure: COLONOSCOPY WITH PROPOFOL;  Surgeon: Kerin Salen, MD;  Location: [facility];  Service: Gastroenterology;  Laterality: N/A;   ESOPHAGOGASTRODUODENOSCOPY (EGD) WITH PROPOFOL N/A 10/18/2022   Procedure: ESOPHAGOGASTRODUODENOSCOPY (EGD) WITH PROPOFOL;  Surgeon: Kathi Der, MD;   Location: [facility];  Service: Gastroenterology;  Laterality: N/A;   IR IMAGING GUIDED PORT INSERTION  11/12/2022   LAPAROTOMY N/A 12/09/2017   Procedure: EXPLORATORY LAPAROTOMY, REPAIR PERFORATED PYLEORIC ULCER;  Surgeon: Almond Lint, MD;  Location: [facility];  Service: General;  Laterality: N/A;   POLYPECTOMY  10/24/2022   Procedure: POLYPECTOMY;  Surgeon: Kerin Salen, MD;  Location: [facility];  Service: Gastroenterology;;   SUBMUCOSAL TATTOO INJECTION  10/18/2022   Procedure: SUBMUCOSAL TATTOO INJECTION;  Surgeon: Kathi Der, MD;  Location: [facility];  Service: Gastroenterology;;    I have reviewed the social history and family history with the patient and they are unchanged from previous note.  ALLERGIES:  is allergic to aspirin.  MEDICATIONS:  Current Outpatient Medications  Medication Sig Dispense Refill   acetaminophen (TYLENOL) 500 MG tablet Take 1,000 mg by mouth every 6 (six) hours as needed for mild pain or headache.     BENADRYL ALLERGY 25 MG tablet Take 25-50 mg by mouth every 6 (six) hours as needed for sleep.     cyanocobalamin 1000 MCG tablet Take 1 tablet (1,000 mcg total) by mouth daily.     lidocaine-prilocaine (EMLA) cream Apply to affected area once 30 g 3  ondansetron (ZOFRAN) 8 MG tablet Take 1 tablet (8 mg total) by mouth every 8 (eight) hours as needed for nausea or vomiting. Start on the third day after chemotherapy. 30 tablet 1   oxyCODONE (ROXICODONE) 5 MG immediate release tablet Take 1 tablet (5 mg total) by mouth every 4 (four) hours as needed for severe pain. 30 tablet 0   pantoprazole (PROTONIX) 40 MG tablet Take 1 tablet (40 mg total) by mouth daily. 30 tablet 3   pantoprazole (PROTONIX) 40 MG tablet Take 1 tablet (40 mg total) by mouth daily. 90 tablet 0   prochlorperazine (COMPAZINE) 10 MG tablet Take 1 tablet (10 mg total) by mouth every 6 (six) hours as needed for nausea or vomiting. 20 tablet 0   prochlorperazine (COMPAZINE) 10 MG tablet  Take 1 tablet (10 mg total) by mouth every 6 (six) hours as needed for nausea or vomiting. 30 tablet 1   No current facility-administered medications for this visit.   Facility-Administered Medications Ordered in Other Visits  Medication Dose Route Frequency Provider Last Rate Last Admin   fluorouracil (ADRUCIL) 4,200 mg in sodium chloride 0.9 % 66 mL chemo infusion  2,400 mg/m2 (Treatment Plan Recorded) Intravenous 1 day or 1 dose [name], MD       heparin lock flush 100 unit/mL  500 Units Intracatheter Once PRN [name], MD       leucovorin 696 mg in dextrose 5 % 250 mL infusion  400 mg/m2 (Treatment Plan Recorded) Intravenous Once [name], MD       oxaliplatin (ELOXATIN) 150 mg in dextrose 5 % 500 mL chemo infusion  85 mg/m2 (Treatment Plan Recorded) Intravenous Once [name], MD       sodium chloride flush (NS) 0.9 % injection 10 mL  10 mL Intracatheter PRN [name], MD        PHYSICAL EXAMINATION: ECOG PERFORMANCE STATUS: 1 - Symptomatic but completely ambulatory  Vitals:   11/18/22 0915  BP: 122/69  Pulse: 68  Resp: 18  Temp: (!) 97.5 F (36.4 C)  SpO2: 100%   Wt Readings from Last 3 Encounters:  11/18/22 138 lb 1.6 oz (62.6 kg)  10/31/22 137 lb 3.2 oz (62.2 kg)  10/29/22 120 lb (54.4 kg)     GENERAL:alert, no distress and comfortable SKIN: skin color normal, no rashes or significant lesions EYES: normal, Conjunctiva are pink and non-injected, sclera clear  NEURO: alert & oriented x 3 with fluent speech LABORATORY DATA:  I have reviewed the data as listed    Latest Ref Rng & Units 11/18/2022    8:58 AM 11/05/2022   11:46 AM 10/29/2022    2:00 AM  CBC  WBC 4.0 - 10.5 K/uL 5.6  5.2  6.4   Hemoglobin 13.0 - 17.0 g/dL 9.7  16.1  9.1   Hematocrit 39.0 - 52.0 % 30.9  32.8  30.8   Platelets 150 - 400 K/uL 206  379  388         Latest Ref Rng & Units 11/18/2022    8:58 AM 11/05/2022   11:46 AM 10/29/2022    2:00 AM  CMP  Glucose 70 - 99 mg/dL 95  85  97   BUN 6 -  20 mg/dL 24  20  26    Creatinine 0.61 - 1.24 mg/dL 0.96  0.45  4.09   Sodium 135 - 145 mmol/L 142  138  138   Potassium 3.5 - 5.1 mmol/L 4.3  4.1  3.7  Chloride 98 - 111 mmol/L 112  106  107   CO2 22 - 32 mmol/L 27  27  25    Calcium 8.9 - 10.3 mg/dL 9.0  8.9  8.8   Total Protein 6.5 - 8.1 g/dL 6.5  7.3  6.7   Total Bilirubin 0.3 - 1.2 mg/dL 0.3  0.5  0.2   Alkaline Phos 38 - 126 U/L 93  111  89   AST 15 - 41 U/L 15  15  16    ALT 0 - 44 U/L 10  11  22        RADIOGRAPHIC STUDIES: I have personally reviewed the radiological images as listed and agreed with the findings in the report. No results found.    No orders of the defined types were placed in this encounter.  All questions were answered. The patient knows to call the clinic with any problems, questions or concerns. No barriers to learning was detected. The total time spent in the appointment was 30 minutes.     [name], MD 11/18/2022   Carolin Coy, CMA, am acting as scribe for [name], MD.   I have reviewed the above documentation for accuracy and completeness, and I agree with the above.

## 2022-11-19 ENCOUNTER — Telehealth: Payer: Self-pay

## 2022-11-19 NOTE — Telephone Encounter (Signed)
Marvin Thomas states that he is doing fine. He is eating, drinking, and urinating well. He knows to call the office at [phone] if he has any questions or concerns.

## 2022-11-19 NOTE — Progress Notes (Signed)
Palliative Medicine [facility]  Telephone:([phone] Fax:(336) [phone]   Name: Marvin Thomas Date: 11/19/2022 MRN: [id number]  DOB: [birth year]  Patient Care Team: Pcp, No as PCP - General Patient, No Pcp Per (General Practice) [name], MD as Consulting Physician (Hematology and Oncology)    REASON FOR CONSULTATION: Marvin Thomas is a 57 y.o. male with oncologic medical history including colon cancer (10/2022) with metastatic disease to the liver and the lung, as well as peptic ulcer disease, COPD, GERD, and anemia. Palliative ask to see for symptom and pain management and goals of care.    SOCIAL HISTORY:     reports that he has been smoking cigarettes. He has a 46.5 pack-year smoking history. He uses smokeless tobacco. He reports current alcohol use. He reports current drug use. Drug: Marijuana.  ADVANCE DIRECTIVES:  None on file   CODE STATUS: Full code  PAST MEDICAL HISTORY: Past Medical History:  Diagnosis Date   Anemia Dx 2014   Gallstones    Hemothorax    Ulcer     PAST SURGICAL HISTORY:  Past Surgical History:  Procedure Laterality Date   BIOPSY  10/18/2022   Procedure: BIOPSY;  Surgeon: Kathi Der, MD;  Location: [facility];  Service: Gastroenterology;;   BIOPSY  10/24/2022   Procedure: BIOPSY;  Surgeon: Kerin Salen, MD;  Location: [facility];  Service: Gastroenterology;;   COLONOSCOPY WITH PROPOFOL N/A 10/18/2022   Procedure: COLONOSCOPY WITH PROPOFOL;  Surgeon: Kathi Der, MD;  Location: [facility];  Service: Gastroenterology;  Laterality: N/A;   COLONOSCOPY WITH PROPOFOL N/A 10/24/2022   Procedure: COLONOSCOPY WITH PROPOFOL;  Surgeon: Kerin Salen, MD;  Location: [facility];  Service: Gastroenterology;  Laterality: N/A;   ESOPHAGOGASTRODUODENOSCOPY (EGD) WITH PROPOFOL N/A 10/18/2022   Procedure: ESOPHAGOGASTRODUODENOSCOPY (EGD) WITH PROPOFOL;  Surgeon: Kathi Der, MD;  Location: [facility];  Service:  Gastroenterology;  Laterality: N/A;   IR IMAGING GUIDED PORT INSERTION  11/12/2022   LAPAROTOMY N/A 12/09/2017   Procedure: EXPLORATORY LAPAROTOMY, REPAIR PERFORATED PYLEORIC ULCER;  Surgeon: Almond Lint, MD;  Location: [facility];  Service: General;  Laterality: N/A;   POLYPECTOMY  10/24/2022   Procedure: POLYPECTOMY;  Surgeon: Kerin Salen, MD;  Location: [facility];  Service: Gastroenterology;;   SUBMUCOSAL TATTOO INJECTION  10/18/2022   Procedure: SUBMUCOSAL TATTOO INJECTION;  Surgeon: Kathi Der, MD;  Location: [facility];  Service: Gastroenterology;;    HEMATOLOGY/ONCOLOGY HISTORY:  Oncology History Overview Note   Cancer Staging  Metastatic colon cancer to liver [facility]) Staging form: Colon and Rectum, AJCC 8th Edition - Clinical stage from 10/24/2022: Stage IVB (cTX, cN0, pM1b) - Signed by [name], MD on 10/31/2022 Total positive nodes: 0     Metastatic colon cancer to liver (HCC)  10/17/2022 Imaging   CT CHEST W CONTRAST   IMPRESSION: 3 cm mass in the posterior right upper lobe with some dystrophic calcification. In addition there is an abnormal precarinal lymph node. Malignancy is possible. Recommend further workup such as PET-CT scan when clinically appropriate.   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 The previous larger bulla in the right lung apex is not seen on the current study.   Coronary artery calcifications. Please correlate for other coronary risk factors.   Aortic Atherosclerosis (ICD10-I70.0) and Emphysema (ICD10-J43.9).     10/24/2022 Cancer Staging   Staging form: Colon and Rectum, AJCC 8th Edition - Clinical stage from 10/24/2022: Stage IVB (cTX, cN0, pM1b) - Signed by [name], MD on  10/31/2022 Total positive nodes: 0   10/29/2022 Imaging   C ABDOMEN PELVIS W CONTRAST   IMPRESSION: 1. 3.3 x 3.2 x 2.6 cm rounded masslike wall abnormality in the mid transverse colon just left of the midline, suspected to be a primary tumor. This  fills most of luminal volume but there is no evidence of an upstream mechanical obstruction. 2. Four metastatic liver masses are again noted, with the largest lesion in segment 8 measuring 5.1 cm, previously 4.3 cm. 3. Mesenteric edema which could be congestive, due to malnutrition or hepatic dysfunction. Some of this is seen adjacent to the pancreas. It is possible the patient could have pancreatitis but no peripancreatic fluid or ductal dilatation is seen. Correlate with serum lipase. 4. Diverticulosis and moderate fecal stasis. 5. Aortic atherosclerosis.   10/31/2022 Initial Diagnosis   Metastatic colon cancer to liver [facility])   11/06/2022 Miscellaneous      11/18/2022 -  Chemotherapy   Patient is on Treatment Plan : COLORECTAL FOLFOX + Bevacizumab q14d       ALLERGIES:  is allergic to aspirin.  MEDICATIONS:  Current Outpatient Medications  Medication Sig Dispense Refill   acetaminophen (TYLENOL) 500 MG tablet Take 1,000 mg by mouth every 6 (six) hours as needed for mild pain or headache.     BENADRYL ALLERGY 25 MG tablet Take 25-50 mg by mouth every 6 (six) hours as needed for sleep.     cyanocobalamin 1000 MCG tablet Take 1 tablet (1,000 mcg total) by mouth daily.     lidocaine-prilocaine (EMLA) cream Apply to affected area once 30 g 3   ondansetron (ZOFRAN) 8 MG tablet Take 1 tablet (8 mg total) by mouth every 8 (eight) hours as needed for nausea or vomiting. Start on the third day after chemotherapy. 30 tablet 1   oxyCODONE (ROXICODONE) 5 MG immediate release tablet Take 1 tablet (5 mg total) by mouth every 4 (four) hours as needed for severe pain. 30 tablet 0   pantoprazole (PROTONIX) 40 MG tablet Take 1 tablet (40 mg total) by mouth daily. 30 tablet 3   pantoprazole (PROTONIX) 40 MG tablet Take 1 tablet (40 mg total) by mouth daily. 90 tablet 0   prochlorperazine (COMPAZINE) 10 MG tablet Take 1 tablet (10 mg total) by mouth every 6 (six) hours as needed for nausea or vomiting.  20 tablet 0   prochlorperazine (COMPAZINE) 10 MG tablet Take 1 tablet (10 mg total) by mouth every 6 (six) hours as needed for nausea or vomiting. 30 tablet 1   No current facility-administered medications for this visit.    VITAL SIGNS: There were no vitals taken for this visit. There were no vitals filed for this visit.  Estimated body mass index is 20.39 kg/m as calculated from the following:   Height as of 11/18/22: 5\' 9"  (1.753 m).   Weight as of 11/18/22: 138 lb 1.6 oz (62.6 kg).  LABS: CBC:    Component Value Date/Time   WBC 5.6 11/18/2022 0858   WBC 5.2 11/05/2022 1146   HGB 9.7 (L) 11/18/2022 0858   HCT 30.9 (L) 11/18/2022 0858   PLT 206 11/18/2022 0858   MCV 78.2 (L) 11/18/2022 0858   NEUTROABS 3.8 11/18/2022 0858   LYMPHSABS 0.9 11/18/2022 0858   MONOABS 0.8 11/18/2022 0858   EOSABS 0.1 11/18/2022 0858   BASOSABS 0.0 11/18/2022 0858   Comprehensive Metabolic Panel:    Component Value Date/Time   NA 142 11/18/2022 0858   K 4.3 11/18/2022 0858  CL 112 (H) 11/18/2022 0858   CO2 27 11/18/2022 0858   BUN 24 (H) 11/18/2022 0858   CREATININE 0.84 11/18/2022 0858   CREATININE 0.84 01/30/2014 1020   GLUCOSE 95 11/18/2022 0858   CALCIUM 9.0 11/18/2022 0858   AST 15 11/18/2022 0858   ALT 10 11/18/2022 0858   ALKPHOS 93 11/18/2022 0858   BILITOT 0.3 11/18/2022 0858   PROT 6.5 11/18/2022 0858   ALBUMIN 3.5 11/18/2022 0858    RADIOGRAPHIC STUDIES: CT ABDOMEN PELVIS W CONTRAST  Result Date: 10/29/2022 CLINICAL DATA:  Left upper quadrant pain. 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 EXAM: CT ABDOMEN AND PELVIS WITH CONTRAST TECHNIQUE: Multidetector CT imaging of the abdomen and pelvis was performed using the standard protocol following bolus administration of intravenous contrast. RADIATION DOSE REDUCTION: This exam was performed according to the departmental dose-optimization program  which includes automated exposure control, adjustment of the mA and/or kV according to patient size and/or use of iterative reconstruction technique. CONTRAST:  80mL OMNIPAQUE IOHEXOL 300 MG/ML  SOLN COMPARISON:  CT without contrast 10/16/2022, MRI without and with contrast 10/16/2022, CT with IV contrast 09/04/2021. FINDINGS: Lower chest: No lung base nodules or infiltrates. COPD change. The cardiac size is normal. Hepatobiliary: Metastatic liver masses are again noted. The largest 2 both of segment 8, the more lateral of the 2 lesions measuring 5.1 cm transverse on 2:17, previously 4.3 cm. Medial to this the other segment 8 lesion is 4.4 cm, previously 4.5 cm. More cephalad in segment 4A, a third metastasis measures 3 cm on 2:13, stable. A fourth metastasis inferiorly in segment 6 measures 2.6 cm, previously 2.6 cm. Small scattered cysts in the hepatic dome are also again noted but no new metastasis is seen. The gallbladder and bile ducts are unremarkable. Pancreas: There is upper abdominal mesenteric edema which could be congestive, due to malnutrition or hepatic dysfunction. Some of this is seen adjacent to the pancreas. It is possible the patient could have pancreatitis but no peripancreatic fluid is seen or ductal dilatation. There is no mass enhancement. Correlate with serum lipase. Spleen: No abnormality. Adrenals/Urinary Tract: Adrenal glands are unremarkable. Kidneys are normal, without renal calculi, focal lesion, or hydronephrosis. Bladder is unremarkable. Stomach/Bowel: Diffuse thickened folds in the stomach are chronically seen. The small intestine is normal in caliber. The appendix is normal in caliber and best seen on the coronal reformatting. Moderate fecal stasis. In the mid transverse colon just left of the midline there is a rounded masslike wall abnormality measuring 3.3 x 3.2 cm on 2:49, 2.6 cm in height on 7:35, and filling most of the luminal volume, which is suspected to be a primary tumor.  There is scattered colonic diverticulosis without evidence of diverticulitis or colitis. Vascular/Lymphatic: Aortic atherosclerosis. No enlarged abdominal or pelvic lymph nodes. Reproductive: Prostate is unremarkable. Other: Minimal pelvic ascites. No free air. Hazy edema persists throughout the mesentery but especially in the abdomen. Trace subhepatic ascites. Musculoskeletal: Degenerative change thoracic and lumbar spine, lumbar spondylosis and multilevel endplate Schmorl's nodes. No aggressive osseous lesion is seen. IMPRESSION: 1. 3.3 x 3.2 x 2.6 cm rounded masslike wall abnormality in the mid transverse colon just left of the midline, suspected to be a primary tumor. This fills most of luminal volume but there is no evidence of an upstream mechanical obstruction. 2. Four metastatic liver masses are again noted, with the largest lesion in segment 8 measuring 5.1 cm, previously 4.3 cm. 3. Mesenteric edema which  could be congestive, due to malnutrition or hepatic dysfunction. Some of this is seen adjacent to the pancreas. It is possible the patient could have pancreatitis but no peripancreatic fluid or ductal dilatation is seen. Correlate with serum lipase. 4. Diverticulosis and moderate fecal stasis. 5. Aortic atherosclerosis. Aortic Atherosclerosis (ICD10-I70.0). Electronically Signed   By: Almira Bar M.D.   On: 10/29/2022 05:02   PERFORMANCE STATUS (ECOG) : 1 - Symptomatic but completely ambulatory  Review of Systems  Constitutional:  Positive for activity change and appetite change.  Musculoskeletal:  Positive for arthralgias.  Unless otherwise noted, a complete review of systems is negative.  Physical Exam General: NAD Cardiovascular: regular rate and rhythm Pulmonary: clear ant fields Abdomen: soft, nontender, + bowel sounds Extremities: no edema, no joint deformities Skin: no rashes Neurological:AAO x3  IMPRESSION: This is my initial visit with Marvin Thomas. No acute distress. His  sister is present with him. Patient is ambulatory. Alert and able to engage appropriately in discussions.   I introduced myself, Maygan RN, and Palliative's role in collaboration with the oncology team. Concept of Palliative Care was introduced as specialized medical care for people and their families living with serious illness.  It focuses on providing relief from the symptoms and stress of a serious illness.  The goal is to improve quality of life for both the patient and the family. Values and goals of care important to patient and family were attempted to be elicited.   Marvin Thomas lives in the home alone. He has a 8 year old son. His sister, Marvin Thomas is his biggest support system. 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 He is able to perform all ADLs independently with some limitations due to occasional fatigue and pain.   Jaycean denies nausea, vomiting, constipation, or diarrhea. Appetite is fair. Some days are better than others. Fluctuates around treatment days.   Neoplasm related pain Marvin Thomas reports his pain has significantly improved over the past several weeks. States pain is in his abdomen and lower back. Describes pain and intermittent, throbbing, and achy. Pain worsens with certain activities or movement. Tends to be worst late evening/night hours and first thing in the morning.   He is currently taking oxycodone as needed for pain. States he is only requiring 1-2 times daily. Feels his pain is improved with medication rating pain 2-3 out of 10.   Extensive medication, chart, and social history review completed. Patient denies any alcohol or illicit drug use. Education provided on criteria for ongoing pain support via palliative team. Pain contract presented with extensive education to allow for safe management. Patient verbalized understanding and willingly signed pain contract. Copy provided.    Given patient is minimally requiring daily medication and pain is improving/controlled no  adjustments at this time. We will continue to closely monitor and support.   Constipation Occasional constipation. Currently taking dulcolax every other day. Education provided on importance of bowel regimen. Encouraged daily use of Miralax.   Goals of Care We discussed his current illness and what it means in the larger context of his on-going co-morbidities. Natural disease trajectory and expectations were discussed.  Marvin Thomas and his sister are realistic in their understanding. They are clear in expressed goals to continue to treat the treatable allowing him every opportunity to continue thriving.   I discussed the importance of continued conversation with family and their medical providers regarding overall plan of care and treatment options, ensuring decisions are within the context of the patients values  and GOCs.  PLAN: Established therapeutic relationship. Education provided on palliative's role in collaboration with their Oncology/Radiation team. Oxycodone as needed for pain. States he is taking 1-2 times daily as needed.  No adjustments at this time.  Pain contract completed.  Ongoing symptom management as needed.  I will plan to see patient back in 2-4 weeks in collaboration to other oncology appointments.    Patient expressed understanding and was in agreement with this plan. He also understands that He can call the clinic at any time with any questions, concerns, or complaints.   Thank you for your referral and allowing Palliative to assist in Marvin [facility] care.   Number and complexity of problems addressed: HIGH - 1 or more chronic illnesses with SEVERE exacerbation, progression, or side effects of treatment - advanced cancer, pain. Any controlled substances utilized were prescribed in the context of palliative care.   Visit consisted of counseling and education dealing with the complex and emotionally intense issues of symptom management and palliative care in the  setting of serious and potentially life-threatening illness.Greater than 50%  of this time was spent counseling and coordinating care related to the above assessment and plan.  Signed by: [name], AGPCNP-BC Palliative Medicine Team/[facility]   *Please note that this is a verbal dictation therefore any spelling or grammatical errors are due to the "Dragon Medical One" system interpretation.

## 2022-11-19 NOTE — Telephone Encounter (Signed)
-----   Message from Nurse Dillard Essex sent at 11/18/2022  2:18 PM EDT ----- Regarding: Dr. Mosetta Putt Pt. first time Oxaliplatin, Leucovorin and 5FU. Tolerated well, no issues noted. Thank you!

## 2022-11-20 ENCOUNTER — Inpatient Hospital Stay (HOSPITAL_BASED_OUTPATIENT_CLINIC_OR_DEPARTMENT_OTHER): Payer: No Typology Code available for payment source | Admitting: Nurse Practitioner

## 2022-11-20 ENCOUNTER — Encounter: Payer: Self-pay | Admitting: Nurse Practitioner

## 2022-11-20 ENCOUNTER — Inpatient Hospital Stay: Payer: No Typology Code available for payment source

## 2022-11-20 VITALS — BP 110/67 | HR 60 | Temp 97.8°F | Resp 16 | Ht 69.0 in | Wt 137.4 lb

## 2022-11-20 VITALS — BP 107/68 | HR 53 | Temp 98.0°F | Resp 18

## 2022-11-20 DIAGNOSIS — K5903 Drug induced constipation: Secondary | ICD-10-CM

## 2022-11-20 DIAGNOSIS — G893 Neoplasm related pain (acute) (chronic): Secondary | ICD-10-CM | POA: Diagnosis not present

## 2022-11-20 DIAGNOSIS — C787 Secondary malignant neoplasm of liver and intrahepatic bile duct: Secondary | ICD-10-CM

## 2022-11-20 DIAGNOSIS — Z7189 Other specified counseling: Secondary | ICD-10-CM | POA: Diagnosis not present

## 2022-11-20 DIAGNOSIS — Z515 Encounter for palliative care: Secondary | ICD-10-CM | POA: Diagnosis not present

## 2022-11-20 DIAGNOSIS — C189 Malignant neoplasm of colon, unspecified: Secondary | ICD-10-CM

## 2022-11-20 DIAGNOSIS — C180 Malignant neoplasm of cecum: Secondary | ICD-10-CM | POA: Diagnosis not present

## 2022-11-20 MED ORDER — HEPARIN SOD (PORK) LOCK FLUSH 100 UNIT/ML IV SOLN
500.0000 [IU] | Freq: Once | INTRAVENOUS | Status: AC | PRN
  Administered 2022-11-20: 500 [IU]

## 2022-11-20 MED ORDER — SODIUM CHLORIDE 0.9% FLUSH
10.0000 mL | INTRAVENOUS | Status: DC | PRN
  Administered 2022-11-20: 10 mL

## 2022-11-20 NOTE — Patient Instructions (Signed)
-   call 2-3 days ahead for a refill of your pain medication - continue with your oxycodone as prescribed  - call the office at [phone] with any questions or concerns  - we will see you in september

## 2022-11-20 NOTE — Patient Instructions (Signed)

## 2022-11-21 ENCOUNTER — Encounter: Payer: Self-pay | Admitting: Hematology

## 2022-11-25 ENCOUNTER — Encounter: Payer: Self-pay | Admitting: Hematology

## 2022-11-25 ENCOUNTER — Encounter (HOSPITAL_COMMUNITY): Payer: Self-pay | Admitting: Hematology

## 2022-11-26 NOTE — Progress Notes (Signed)
Palliative Medicine [facility]  Telephone:(336) [phone] Fax:(336) [phone]   Name: Marvin Thomas Date: 11/26/2022 MRN: [id number]  DOB: [birth year]  Patient Care Team: Pcp, No as PCP - General Patient, No Pcp Per (General Practice) [name], MD as Consulting Physician (Hematology and Oncology) Pickenpack-Cousar, [name], NP as Nurse Practitioner (Hospice and Palliative Medicine)    INTERVAL HISTORY: Marvin Thomas is a 57 y.o. male with oncologic medical history including colon cancer (10/2022) with metastatic disease to the liver and the lung, as well as peptic ulcer disease, COPD, GERD, and anemia. Palliative ask to see for symptom and pain management and goals of care.   SOCIAL HISTORY:     reports that he has been smoking cigarettes. He has a 46.5 pack-year smoking history. He uses smokeless tobacco. He reports current alcohol use. He reports current drug use. Drug: Marijuana.  ADVANCE DIRECTIVES:  None on file  CODE STATUS: Full code  PAST MEDICAL HISTORY: Past Medical History:  Diagnosis Date   Anemia Dx 2014   Gallstones    Hemothorax    Ulcer     ALLERGIES:  is allergic to aspirin.  MEDICATIONS:  Current Outpatient Medications  Medication Sig Dispense Refill   acetaminophen (TYLENOL) 500 MG tablet Take 1,000 mg by mouth every 6 (six) hours as needed for mild pain or headache.     BENADRYL ALLERGY 25 MG tablet Take 25-50 mg by mouth every 6 (six) hours as needed for sleep.     cyanocobalamin 1000 MCG tablet Take 1 tablet (1,000 mcg total) by mouth daily.     lidocaine-prilocaine (EMLA) cream Apply to affected area once 30 g 3   ondansetron (ZOFRAN) 8 MG tablet Take 1 tablet (8 mg total) by mouth every 8 (eight) hours as needed for nausea or vomiting. Start on the third day after chemotherapy. 30 tablet 1   oxyCODONE (ROXICODONE) 5 MG immediate release tablet Take 1 tablet (5 mg total) by mouth every 4 (four) hours as needed for severe  pain. 30 tablet 0   pantoprazole (PROTONIX) 40 MG tablet Take 1 tablet (40 mg total) by mouth daily. 30 tablet 3   pantoprazole (PROTONIX) 40 MG tablet Take 1 tablet (40 mg total) by mouth daily. 90 tablet 0   prochlorperazine (COMPAZINE) 10 MG tablet Take 1 tablet (10 mg total) by mouth every 6 (six) hours as needed for nausea or vomiting. 20 tablet 0   prochlorperazine (COMPAZINE) 10 MG tablet Take 1 tablet (10 mg total) by mouth every 6 (six) hours as needed for nausea or vomiting. 30 tablet 1   No current facility-administered medications for this visit.    VITAL SIGNS: There were no vitals taken for this visit. There were no vitals filed for this visit.  Estimated body mass index is 20.29 kg/m as calculated from the following:   Height as of 11/20/22: 5\' 9"  (1.753 m).   Weight as of 11/20/22: 137 lb 6.4 oz (62.3 kg).   PERFORMANCE STATUS (ECOG) : 1 - Symptomatic but completely ambulatory   Physical Exam General: NAD Cardiovascular: regular rate and rhythm Pulmonary: clear ant fields Abdomen: soft, nontender, + bowel sounds Extremities: no edema, no joint deformities Skin: no rashes Neurological: AAO x4  IMPRESSION: Mr. Marvin Thomas presented to clinic for follow-up. No acute distress. Family present. Is remaining active. Appetite is good.  Eyes nausea, vomiting, constipation, or diarrhea.  States overall he is feeling well.  No insomnia.  Neoplasm related pain  Mr. Marvin Thomas reports his pain is minimal.  He is not having to take pain medication daily.  He has oxycodone on hand at home.  Reports last taken dose was 4 days ago.  PDMP reviewed.  Last prescription received at discharge on 8/7.  We discussed if he is not having severe pain he does not need to take medication.  Able to take Tylenol for mild to moderate aches or pain as needed.  He verbalized understanding.  Patient knows to contact office as needed.     Constipation Occasional constipation. Currently taking dulcolax every  other day. Education provided on importance of bowel regimen. Encouraged daily use of Miralax.    Goals of Care  11/20/22- We discussed his current illness and what it means in the larger context of his on-going co-morbidities. Natural disease trajectory and expectations were discussed.   Marvin Thomas and his sister are realistic in their understanding. They are clear in expressed goals to continue to treat the treatable allowing him every opportunity to continue thriving.   We discussed Her current illness and what it means in the larger context of Her on-going co-morbidities. Natural disease trajectory and expectations were discussed.  I discussed the importance of continued conversation with family and their medical providers regarding overall plan of care and treatment options, ensuring decisions are within the context of the patients values and GOCs.  PLAN:  Oxycodone as needed for pain.  Not requiring daily.  Last refill received at discharge on 8/7.   Pain contract on file Ongoing symptom management as needed.  Patient knows to contact office as needed I will plan to see patient back in 8-10 weeks as needed.    Patient expressed understanding and was in agreement with this plan. He also understands that He can call the clinic at any time with any questions, concerns, or complaints.   Any controlled substances utilized were prescribed in the context of palliative care. PDMP has been reviewed.    Visit consisted of counseling and education dealing with the complex and emotionally intense issues of symptom management and palliative care in the setting of serious and potentially life-threatening illness.Greater than 50%  of this time was spent counseling and coordinating care related to the above assessment and plan.  [name], AGPCNP-BC  Palliative Medicine Team/[facility]  *Please note that this is a verbal dictation therefore any spelling or grammatical  errors are due to the "Dragon Medical One" system interpretation.

## 2022-12-01 MED FILL — Dexamethasone Sodium Phosphate Inj 100 MG/10ML: INTRAMUSCULAR | Qty: 1 | Status: AC

## 2022-12-02 ENCOUNTER — Inpatient Hospital Stay: Payer: No Typology Code available for payment source

## 2022-12-02 ENCOUNTER — Inpatient Hospital Stay: Payer: No Typology Code available for payment source | Attending: Physician Assistant

## 2022-12-02 ENCOUNTER — Inpatient Hospital Stay (HOSPITAL_BASED_OUTPATIENT_CLINIC_OR_DEPARTMENT_OTHER): Payer: No Typology Code available for payment source | Admitting: Hematology

## 2022-12-02 ENCOUNTER — Encounter: Payer: Self-pay | Admitting: Nurse Practitioner

## 2022-12-02 ENCOUNTER — Encounter: Payer: Self-pay | Admitting: Hematology

## 2022-12-02 ENCOUNTER — Inpatient Hospital Stay (HOSPITAL_BASED_OUTPATIENT_CLINIC_OR_DEPARTMENT_OTHER): Payer: No Typology Code available for payment source | Admitting: Nurse Practitioner

## 2022-12-02 VITALS — BP 118/71 | HR 59 | Temp 97.6°F | Resp 17 | Ht 69.0 in | Wt 147.3 lb

## 2022-12-02 DIAGNOSIS — Z5112 Encounter for antineoplastic immunotherapy: Secondary | ICD-10-CM | POA: Diagnosis present

## 2022-12-02 DIAGNOSIS — G893 Neoplasm related pain (acute) (chronic): Secondary | ICD-10-CM | POA: Insufficient documentation

## 2022-12-02 DIAGNOSIS — J439 Emphysema, unspecified: Secondary | ICD-10-CM | POA: Diagnosis not present

## 2022-12-02 DIAGNOSIS — C180 Malignant neoplasm of cecum: Secondary | ICD-10-CM | POA: Insufficient documentation

## 2022-12-02 DIAGNOSIS — I700 Atherosclerosis of aorta: Secondary | ICD-10-CM | POA: Insufficient documentation

## 2022-12-02 DIAGNOSIS — C189 Malignant neoplasm of colon, unspecified: Secondary | ICD-10-CM

## 2022-12-02 DIAGNOSIS — Z79631 Long term (current) use of antimetabolite agent: Secondary | ICD-10-CM | POA: Insufficient documentation

## 2022-12-02 DIAGNOSIS — D500 Iron deficiency anemia secondary to blood loss (chronic): Secondary | ICD-10-CM | POA: Diagnosis not present

## 2022-12-02 DIAGNOSIS — F1290 Cannabis use, unspecified, uncomplicated: Secondary | ICD-10-CM | POA: Insufficient documentation

## 2022-12-02 DIAGNOSIS — R634 Abnormal weight loss: Secondary | ICD-10-CM | POA: Insufficient documentation

## 2022-12-02 DIAGNOSIS — K219 Gastro-esophageal reflux disease without esophagitis: Secondary | ICD-10-CM | POA: Diagnosis not present

## 2022-12-02 DIAGNOSIS — K047 Periapical abscess without sinus: Secondary | ICD-10-CM | POA: Insufficient documentation

## 2022-12-02 DIAGNOSIS — F17210 Nicotine dependence, cigarettes, uncomplicated: Secondary | ICD-10-CM | POA: Insufficient documentation

## 2022-12-02 DIAGNOSIS — J449 Chronic obstructive pulmonary disease, unspecified: Secondary | ICD-10-CM | POA: Insufficient documentation

## 2022-12-02 DIAGNOSIS — Z886 Allergy status to analgesic agent status: Secondary | ICD-10-CM | POA: Diagnosis not present

## 2022-12-02 DIAGNOSIS — Z515 Encounter for palliative care: Secondary | ICD-10-CM

## 2022-12-02 DIAGNOSIS — Z79899 Other long term (current) drug therapy: Secondary | ICD-10-CM | POA: Diagnosis not present

## 2022-12-02 DIAGNOSIS — K5900 Constipation, unspecified: Secondary | ICD-10-CM | POA: Diagnosis not present

## 2022-12-02 DIAGNOSIS — C787 Secondary malignant neoplasm of liver and intrahepatic bile duct: Secondary | ICD-10-CM | POA: Insufficient documentation

## 2022-12-02 DIAGNOSIS — Z79630 Long term (current) use of alkylating agent: Secondary | ICD-10-CM | POA: Insufficient documentation

## 2022-12-02 DIAGNOSIS — Z5111 Encounter for antineoplastic chemotherapy: Secondary | ICD-10-CM | POA: Insufficient documentation

## 2022-12-02 DIAGNOSIS — R160 Hepatomegaly, not elsewhere classified: Secondary | ICD-10-CM | POA: Diagnosis not present

## 2022-12-02 DIAGNOSIS — C7800 Secondary malignant neoplasm of unspecified lung: Secondary | ICD-10-CM | POA: Diagnosis not present

## 2022-12-02 DIAGNOSIS — I2510 Atherosclerotic heart disease of native coronary artery without angina pectoris: Secondary | ICD-10-CM | POA: Insufficient documentation

## 2022-12-02 LAB — CBC WITH DIFFERENTIAL (CANCER CENTER ONLY)
Abs Immature Granulocytes: 0.01 10*3/uL (ref 0.00–0.07)
Basophils Absolute: 0 10*3/uL (ref 0.0–0.1)
Basophils Relative: 0 %
Eosinophils Absolute: 0.1 10*3/uL (ref 0.0–0.5)
Eosinophils Relative: 1 %
HCT: 29 % — ABNORMAL LOW (ref 39.0–52.0)
Hemoglobin: 9.1 g/dL — ABNORMAL LOW (ref 13.0–17.0)
Immature Granulocytes: 0 %
Lymphocytes Relative: 19 %
Lymphs Abs: 0.9 10*3/uL (ref 0.7–4.0)
MCH: 24.5 pg — ABNORMAL LOW (ref 26.0–34.0)
MCHC: 31.4 g/dL (ref 30.0–36.0)
MCV: 78 fL — ABNORMAL LOW (ref 80.0–100.0)
Monocytes Absolute: 0.6 10*3/uL (ref 0.1–1.0)
Monocytes Relative: 12 %
Neutro Abs: 3.2 10*3/uL (ref 1.7–7.7)
Neutrophils Relative %: 68 %
Platelet Count: 220 10*3/uL (ref 150–400)
RBC: 3.72 MIL/uL — ABNORMAL LOW (ref 4.22–5.81)
RDW: 18.7 % — ABNORMAL HIGH (ref 11.5–15.5)
WBC Count: 4.8 10*3/uL (ref 4.0–10.5)
nRBC: 0 % (ref 0.0–0.2)

## 2022-12-02 LAB — CMP (CANCER CENTER ONLY)
ALT: 8 U/L (ref 0–44)
AST: 13 U/L — ABNORMAL LOW (ref 15–41)
Albumin: 3.3 g/dL — ABNORMAL LOW (ref 3.5–5.0)
Alkaline Phosphatase: 83 U/L (ref 38–126)
Anion gap: 3 — ABNORMAL LOW (ref 5–15)
BUN: 14 mg/dL (ref 6–20)
CO2: 26 mmol/L (ref 22–32)
Calcium: 8.5 mg/dL — ABNORMAL LOW (ref 8.9–10.3)
Chloride: 112 mmol/L — ABNORMAL HIGH (ref 98–111)
Creatinine: 0.88 mg/dL (ref 0.61–1.24)
GFR, Estimated: >60 mL/min (ref >60)
Glucose, Bld: 84 mg/dL (ref 70–99)
Potassium: 3.8 mmol/L (ref 3.5–5.1)
Sodium: 141 mmol/L (ref 135–145)
Total Bilirubin: 0.2 mg/dL — ABNORMAL LOW (ref 0.3–1.2)
Total Protein: 6.1 g/dL — ABNORMAL LOW (ref 6.5–8.1)

## 2022-12-02 LAB — FERRITIN: Ferritin: 112 ng/mL (ref 24–336)

## 2022-12-02 MED ORDER — SODIUM CHLORIDE 0.9 % IV SOLN: Freq: Once | INTRAVENOUS | Status: AC

## 2022-12-02 MED ORDER — SODIUM CHLORIDE 0.9 % IV SOLN
2400.0000 mg/m2 | INTRAVENOUS | Status: DC
  Administered 2022-12-02: 4200 mg via INTRAVENOUS
  Filled 2022-12-02: qty 84

## 2022-12-02 MED ORDER — SODIUM CHLORIDE 0.9 % IV SOLN
5.0000 mg/kg | Freq: Once | INTRAVENOUS | Status: AC
  Administered 2022-12-02: 300 mg via INTRAVENOUS
  Filled 2022-12-02: qty 12

## 2022-12-02 MED ORDER — SODIUM CHLORIDE 0.9 % IV SOLN
10.0000 mg | Freq: Once | INTRAVENOUS | Status: AC
  Administered 2022-12-02: 10 mg via INTRAVENOUS
  Filled 2022-12-02: qty 10

## 2022-12-02 MED ORDER — PALONOSETRON HCL INJECTION 0.25 MG/5ML
0.2500 mg | Freq: Once | INTRAVENOUS | Status: AC
  Administered 2022-12-02: 0.25 mg via INTRAVENOUS
  Filled 2022-12-02: qty 5

## 2022-12-02 MED ORDER — LEUCOVORIN CALCIUM INJECTION 350 MG
400.0000 mg/m2 | Freq: Once | INTRAVENOUS | Status: AC
  Administered 2022-12-02: 696 mg via INTRAVENOUS
  Filled 2022-12-02: qty 34.8

## 2022-12-02 MED ORDER — OXALIPLATIN CHEMO INJECTION 100 MG/20ML
85.0000 mg/m2 | Freq: Once | INTRAVENOUS | Status: AC
  Administered 2022-12-02: 150 mg via INTRAVENOUS
  Filled 2022-12-02: qty 6.06

## 2022-12-02 MED ORDER — DEXTROSE 5 % IV SOLN: Freq: Once | INTRAVENOUS | Status: AC

## 2022-12-02 MED ORDER — SODIUM CHLORIDE 0.9% FLUSH
10.0000 mL | INTRAVENOUS | Status: DC | PRN
  Administered 2022-12-02: 10 mL via INTRAVENOUS

## 2022-12-02 NOTE — Assessment & Plan Note (Signed)
-  Secondary to his colon cancer, he received a blood transfusion in the hospital. -He received iv venofer in 10/2022

## 2022-12-02 NOTE — Assessment & Plan Note (Signed)
--  Likely related to the diffuse liver metastasis  -Continue oxycodone as needed -f/u with palliative care clinic

## 2022-12-02 NOTE — Progress Notes (Signed)
[facility]   [phone] Fax:([phone]   Clinic Follow up Note   Patient Care Team: Pcp, No as PCP - General Patient, No Pcp Per (General Practice) [name], MD as Consulting Physician (Hematology and Oncology) Pickenpack-Cousar, [name], NP as Nurse Practitioner [facility])  Date of Service:  12/02/2022  CHIEF COMPLAINT: f/u of Metastatic colon cancer to liver   CURRENT THERAPY:   FOLOFOX + Bevacizumab q14d    ASSESSMENT:  Marvin Thomas is a 57 y.o. 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  His workup in the hospital reviewed metastatic colon cancer to liver and lung.  I reviewed his lab, biopsy results, images, and multiple endoscopy reports, and discussed the findings with patient and his sister in detail. -Although his biopsy of the cecal mass was negative for malignant cells, his liver biopsy confirmed adenocarcinoma and immunostain will consistent with colorectal primary.  Based on the colonoscopy findings, and severe iron deficiency, this is consistent with metastatic colon cancer -I recommend systemic therapy with FOLFOX and beva, plan to start today.  I again reviewed potential side effects and management with patient, he agrees to proceed. -The goal of chemotherapy is palliative, to prolong his life and improve cancer related symptoms. -I also reviewed the NGS testing results with patient, which showed MSI stable disease, K-ras mutation positive.  Due to the primary right-sided colon cancer, I will not use EGFR inhibitor in the first line setting. -He is not a candidate for immunotherapy as first-line, but he does have high tumor mutation burden, and on the immunotherapy in the later line setting.  -He started FOLFOX on 11/18/2022, tolerated  first cycle well. Beva was held for port placement, plan to start with cycle 2 today   Cancer related pain --Likely related to the diffuse liver metastasis  -Continue oxycodone as needed -f/u with palliative care clinic  Iron deficiency anemia due to chronic blood loss -Secondary to his colon cancer, he received a blood transfusion in the hospital. -He received iv venofer in 10/2022       PLAN: -lab reviewed hg 9.1 - CMP -pending -proceed with FOLFOX+BEVA today  -Repeat scan after cycle 5 -lab/flush and f/u in 12/16/2022.   SUMMARY OF ONCOLOGIC HISTORY: Oncology History Overview Note   Cancer Staging  Metastatic colon cancer to liver [facility]) Staging form: Colon and Rectum, AJCC 8th Edition - Clinical stage from 10/24/2022: Stage IVB (cTX, cN0, pM1b) - Signed by [name], MD on 10/31/2022 Total positive nodes: 0     Metastatic colon cancer to liver (HCC)  10/17/2022 Imaging   CT CHEST W CONTRAST   IMPRESSION: 3 cm mass in the posterior right upper lobe with some dystrophic calcification. In addition there is an abnormal precarinal lymph node. Malignancy is possible. Recommend further workup such as PET-CT scan when clinically appropriate.   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 The previous larger bulla in the right lung apex is not seen on the current study.   Coronary artery calcifications. Please correlate for other coronary risk factors.   Aortic Atherosclerosis (ICD10-I70.0) and Emphysema (ICD10-J43.9).     10/24/2022 Cancer Staging   Staging form: Colon and Rectum, AJCC 8th Edition - Clinical stage from 10/24/2022: Stage IVB (cTX, cN0, pM1b) - Signed by [name],  MD on 10/31/2022 Total positive nodes: 0   10/29/2022 Imaging   C ABDOMEN PELVIS W CONTRAST   IMPRESSION: 1. 3.3 x 3.2 x 2.6 cm rounded masslike wall abnormality in the mid transverse colon just left of the midline, suspected to be a primary tumor. This fills most of luminal volume  but there is no evidence of an upstream mechanical obstruction. 2. Four metastatic liver masses are again noted, with the largest lesion in segment 8 measuring 5.1 cm, previously 4.3 cm. 3. Mesenteric edema which could be congestive, due to malnutrition or hepatic dysfunction. Some of this is seen adjacent to the pancreas. It is possible the patient could have pancreatitis but no peripancreatic fluid or ductal dilatation is seen. Correlate with serum lipase. 4. Diverticulosis and moderate fecal stasis. 5. Aortic atherosclerosis.   10/31/2022 Initial Diagnosis   Metastatic colon cancer to liver (HCC)   11/06/2022 Miscellaneous      11/18/2022 -  Chemotherapy   Patient is on Treatment Plan : COLORECTAL FOLFOX + Bevacizumab q14d        INTERVAL HISTORY:  Marvin Thomas is here for a follow up of Metastatic colon cancer to liver . He was last seen by me on 11/18/2022. He presents to the clinic accompanied by friend. Pt state that he did well with the first cycle of chemo.  Pt denies having any cold sensitivity.     All other systems were reviewed with the patient and are negative.  MEDICAL HISTORY:  Past Medical History:  Diagnosis Date   Anemia Dx 2014   Gallstones    Hemothorax    Ulcer     SURGICAL HISTORY: Past Surgical History:  Procedure Laterality Date   BIOPSY  10/18/2022   Procedure: BIOPSY;  Surgeon: Kathi Der, MD;  Location: [facility];  Service: Gastroenterology;;   BIOPSY  10/24/2022   Procedure: BIOPSY;  Surgeon: Kerin Salen, MD;  Location: [facility];  Service: Gastroenterology;;   COLONOSCOPY WITH PROPOFOL N/A 10/18/2022   Procedure: COLONOSCOPY WITH PROPOFOL;  Surgeon: Kathi Der, MD;  Location: [facility];  Service: Gastroenterology;  Laterality: N/A;   COLONOSCOPY WITH PROPOFOL N/A 10/24/2022   Procedure: COLONOSCOPY WITH PROPOFOL;  Surgeon: Kerin Salen, MD;  Location: [facility];  Service: Gastroenterology;  Laterality: N/A;    ESOPHAGOGASTRODUODENOSCOPY (EGD) WITH PROPOFOL N/A 10/18/2022   Procedure: ESOPHAGOGASTRODUODENOSCOPY (EGD) WITH PROPOFOL;  Surgeon: Kathi Der, MD;  Location: [facility];  Service: Gastroenterology;  Laterality: N/A;   IR IMAGING GUIDED PORT INSERTION  11/12/2022   LAPAROTOMY N/A 12/09/2017   Procedure: EXPLORATORY LAPAROTOMY, REPAIR PERFORATED PYLEORIC ULCER;  Surgeon: Almond Lint, MD;  Location: [facility];  Service: General;  Laterality: N/A;   POLYPECTOMY  10/24/2022   Procedure: POLYPECTOMY;  Surgeon: Kerin Salen, MD;  Location: [facility];  Service: Gastroenterology;;   SUBMUCOSAL TATTOO INJECTION  10/18/2022   Procedure: SUBMUCOSAL TATTOO INJECTION;  Surgeon: Kathi Der, MD;  Location: [facility];  Service: Gastroenterology;;    I have reviewed the social history and family history with the patient and they are unchanged from previous note.  ALLERGIES:  is allergic to aspirin.  MEDICATIONS:  Current Outpatient Medications  Medication Sig Dispense Refill   acetaminophen (TYLENOL) 500 MG tablet Take 1,000 mg by mouth every 6 (six) hours as needed for mild pain or headache.     BENADRYL ALLERGY 25 MG tablet Take 25-50 mg by mouth every 6 (six) hours as needed for sleep.     cyanocobalamin 1000 MCG tablet Take 1  tablet (1,000 mcg total) by mouth daily.     lidocaine-prilocaine (EMLA) cream Apply to affected area once 30 g 3   ondansetron (ZOFRAN) 8 MG tablet Take 1 tablet (8 mg total) by mouth every 8 (eight) hours as needed for nausea or vomiting. Start on the third day after chemotherapy. 30 tablet 1   pantoprazole (PROTONIX) 40 MG tablet Take 1 tablet (40 mg total) by mouth daily. 30 tablet 3   pantoprazole (PROTONIX) 40 MG tablet Take 1 tablet (40 mg total) by mouth daily. 90 tablet 0   prochlorperazine (COMPAZINE) 10 MG tablet Take 1 tablet (10 mg total) by mouth every 6 (six) hours as needed for nausea or vomiting. 20 tablet 0   prochlorperazine (COMPAZINE) 10 MG  tablet Take 1 tablet (10 mg total) by mouth every 6 (six) hours as needed for nausea or vomiting. 30 tablet 1   No current facility-administered medications for this visit.    PHYSICAL EXAMINATION: ECOG PERFORMANCE STATUS: 0 - Asymptomatic  Vitals:   12/02/22 0903  BP: 118/71  Pulse: (!) 59  Resp: 17  Temp: 97.6 F (36.4 C)  SpO2: 100%   Wt Readings from Last 3 Encounters:  12/02/22 147 lb 4.8 oz (66.8 kg)  11/20/22 137 lb 6.4 oz (62.3 kg)  11/18/22 138 lb 1.6 oz (62.6 kg)     GENERAL:alert, no distress and comfortable SKIN: skin color normal, no rashes or significant lesions EYES: normal, Conjunctiva are pink and non-injected, sclera clear  NEURO: alert & oriented x 3 with fluent speech  LABORATORY DATA:  I have reviewed the data as listed    Latest Ref Rng & Units 12/02/2022    8:46 AM 11/18/2022    8:58 AM 11/05/2022   11:46 AM  CBC  WBC 4.0 - 10.5 K/uL 4.8  5.6  5.2   Hemoglobin 13.0 - 17.0 g/dL 9.1  9.7  57.8   Hematocrit 39.0 - 52.0 % 29.0  30.9  32.8   Platelets 150 - 400 K/uL 220  206  379         Latest Ref Rng & Units 11/18/2022    8:58 AM 11/05/2022   11:46 AM 10/29/2022    2:00 AM  CMP  Glucose 70 - 99 mg/dL 95  85  97   BUN 6 - 20 mg/dL 24  20  26    Creatinine 0.61 - 1.24 mg/dL 4.69  6.29  5.28   Sodium 135 - 145 mmol/L 142  138  138   Potassium 3.5 - 5.1 mmol/L 4.3  4.1  3.7   Chloride 98 - 111 mmol/L 112  106  107   CO2 22 - 32 mmol/L 27  27  25    Calcium 8.9 - 10.3 mg/dL 9.0  8.9  8.8   Total Protein 6.5 - 8.1 g/dL 6.5  7.3  6.7   Total Bilirubin 0.3 - 1.2 mg/dL 0.3  0.5  0.2   Alkaline Phos 38 - 126 U/L 93  111  89   AST 15 - 41 U/L 15  15  16    ALT 0 - 44 U/L 10  11  22        RADIOGRAPHIC STUDIES: I have personally reviewed the radiological images as listed and agreed with the findings in the report. No results found.    Orders Placed This Encounter  Procedures   CBC with Differential (Cancer Center Only)    Standing Status:    Future    Standing Expiration  Date:   01/12/2024   CMP (Cancer Center only)    Standing Status:   Future    Standing Expiration Date:   01/12/2024   CBC with Differential (Cancer Center Only)    Standing Status:   Future    Standing Expiration Date:   01/26/2024   CMP (Cancer Center only)    Standing Status:   Future    Standing Expiration Date:   01/26/2024   Total Protein, Urine dipstick    Standing Status:   Future    Standing Expiration Date:   01/26/2024   All questions were answered. The patient knows to call the clinic with any problems, questions or concerns. No barriers to learning was detected. The total time spent in the appointment was 25 minutes.     [name], MD 12/02/2022   Carolin Coy, CMA, am acting as scribe for [name], MD.   I have reviewed the above documentation for accuracy and completeness, and I agree with the above.

## 2022-12-02 NOTE — Assessment & Plan Note (Signed)
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  His workup in the hospital reviewed metastatic colon cancer to liver and lung.  I reviewed his lab, biopsy results, images, and multiple endoscopy reports, and discussed the findings with patient and his sister in detail. -Although his biopsy of the cecal mass was negative for malignant cells, his liver biopsy confirmed adenocarcinoma and immunostain will consistent with colorectal primary.  Based on the colonoscopy findings, and severe iron deficiency, this is consistent with metastatic colon cancer -I recommend systemic therapy with FOLFOX and beva, plan to start today.  I again reviewed potential side effects and management with patient, he agrees to proceed. -The goal of chemotherapy is palliative, to prolong his life and improve cancer related symptoms. -I also reviewed the NGS testing results with patient, which showed MSI stable disease, K-ras mutation positive.  Due to the primary right-sided colon cancer, I will not use EGFR inhibitor in the first line setting. -He is not a candidate for immunotherapy as first-line, but he does have high tumor mutation burden, and on the immunotherapy in the later line setting.  -He started FOLFOX on 11/18/2022, tolerated first cycle well. Beva was held for port placement, plan to start with cycle 2 today

## 2022-12-02 NOTE — Patient Instructions (Addendum)
[place] [facility]   Discharge Instructions: Thank you for choosing [facility] to provide your oncology and hematology care.   If you have a lab appointment with the Cancer Center, please go directly to the Cancer Center and check in at the registration area.   Wear comfortable clothing and clothing appropriate for easy access to any Portacath or PICC line.   We strive to give you quality time with your provider. You may need to reschedule your appointment if you arrive late (15 or more minutes).  Arriving late affects you and other patients whose appointments are after yours.  Also, if you miss three or more appointments without notifying the office, you may be dismissed from the clinic at the provider's discretion.      For prescription refill requests, have your pharmacy contact our office and allow 72 hours for refills to be completed.    Today you received the following chemotherapy and/or immunotherapy agents: Bevacizumab (Avastin), Oxaliplatin, Leucovorin, and Fluorouracil (Adrucil)   To help prevent nausea and vomiting after your treatment, we encourage you to take your nausea medication as directed.  BELOW ARE SYMPTOMS THAT SHOULD BE REPORTED IMMEDIATELY: *FEVER GREATER THAN 100.4 F (38 C) OR HIGHER *CHILLS OR SWEATING *NAUSEA AND VOMITING THAT IS NOT CONTROLLED WITH YOUR NAUSEA MEDICATION *UNUSUAL SHORTNESS OF BREATH *UNUSUAL BRUISING OR BLEEDING *URINARY PROBLEMS (pain or burning when urinating, or frequent urination) *BOWEL PROBLEMS (unusual diarrhea, constipation, pain near the anus) TENDERNESS IN MOUTH AND THROAT WITH OR WITHOUT PRESENCE OF ULCERS (sore throat, sores in mouth, or a toothache) UNUSUAL RASH, SWELLING OR PAIN  UNUSUAL VAGINAL DISCHARGE OR ITCHING   Items with * indicate a potential emergency and should be followed up as soon as possible or go to the Emergency Department if any problems should occur.  Please show  the CHEMOTHERAPY ALERT CARD or IMMUNOTHERAPY ALERT CARD at check-in to the Emergency Department and triage nurse.  Should you have questions after your visit or need to cancel or reschedule your appointment, please contact [place] [facility]  Dept: [phone]  and follow the prompts.  Office hours are 8:00 a.m. to 4:30 p.m. Monday - Friday. Please note that voicemails left after 4:00 p.m. may not be returned until the following business day.  We are closed weekends and major holidays. You have access to a nurse at all times for urgent questions. Please call the main number to the clinic Dept: [phone] and follow the prompts.   For any non-urgent questions, you may also contact your provider using MyChart. We now offer e-Visits for anyone 83 and older to request care online for non-urgent symptoms. For details visit [email or web address].   Also download the MyChart app! Go to the app store, search "MyChart", open the app, select [place], and log in with your MyChart username and password.  The chemotherapy medication bag should finish at 46 hours, 96 hours, or 7 days. For example, if your pump is scheduled for 46 hours and it was put on at 4:00 p.m., it should finish at 2:00 p.m. the day it is scheduled to come off regardless of your appointment time.     Estimated time to finish at 9/12 @ 11:30 AM   If the display on your pump reads "Low Volume" and it is beeping, take the batteries out of the pump and come to the cancer center for it to be taken off.   If the  pump alarms go off prior to the pump reading "Low Volume" then call [phone] and someone can assist you.  If the plunger comes out and the chemotherapy medication is leaking out, please use your home chemo spill kit to clean up the spill. Do NOT use paper towels or other household products.  If you have problems or questions regarding your pump, please call either [phone] (24 hours a  day) or the cancer center Monday-Friday 8:00 a.m.- 4:30 p.m. at the clinic number and we will assist you. If you are unable to get assistance, then go to the nearest Emergency Department and ask the staff to contact the IV team for assistance.

## 2022-12-04 ENCOUNTER — Inpatient Hospital Stay: Payer: No Typology Code available for payment source

## 2022-12-04 VITALS — HR 51 | Temp 98.9°F | Resp 20

## 2022-12-04 DIAGNOSIS — C189 Malignant neoplasm of colon, unspecified: Secondary | ICD-10-CM

## 2022-12-04 DIAGNOSIS — Z5112 Encounter for antineoplastic immunotherapy: Secondary | ICD-10-CM | POA: Diagnosis not present

## 2022-12-04 DIAGNOSIS — D500 Iron deficiency anemia secondary to blood loss (chronic): Secondary | ICD-10-CM

## 2022-12-04 MED ORDER — SODIUM CHLORIDE 0.9% FLUSH
10.0000 mL | INTRAVENOUS | Status: DC | PRN
  Administered 2022-12-04: 10 mL via INTRAVENOUS

## 2022-12-04 MED ORDER — HEPARIN SOD (PORK) LOCK FLUSH 100 UNIT/ML IV SOLN
500.0000 [IU] | Freq: Once | INTRAVENOUS | Status: AC | PRN
  Administered 2022-12-04: 500 [IU]

## 2022-12-09 ENCOUNTER — Encounter: Payer: Self-pay | Admitting: Hematology

## 2022-12-11 ENCOUNTER — Other Ambulatory Visit: Payer: Self-pay

## 2022-12-11 ENCOUNTER — Encounter: Payer: Self-pay | Admitting: Hematology

## 2022-12-13 ENCOUNTER — Encounter (HOSPITAL_COMMUNITY): Payer: Self-pay

## 2022-12-13 ENCOUNTER — Emergency Department (HOSPITAL_COMMUNITY)
Admission: EM | Admit: 2022-12-13 | Discharge: 2022-12-13 | Disposition: A | Discharge status: Home / Self Care | Payer: Medicaid Other | Attending: Emergency Medicine | Admitting: Emergency Medicine

## 2022-12-13 DIAGNOSIS — K0889 Other specified disorders of teeth and supporting structures: Secondary | ICD-10-CM | POA: Diagnosis present

## 2022-12-13 DIAGNOSIS — J449 Chronic obstructive pulmonary disease, unspecified: Secondary | ICD-10-CM | POA: Insufficient documentation

## 2022-12-13 DIAGNOSIS — K047 Periapical abscess without sinus: Secondary | ICD-10-CM | POA: Diagnosis not present

## 2022-12-13 DIAGNOSIS — Z85038 Personal history of other malignant neoplasm of large intestine: Secondary | ICD-10-CM | POA: Diagnosis not present

## 2022-12-13 MED ORDER — AMOXICILLIN-POT CLAVULANATE 875-125 MG PO TABS: 1.0000 | ORAL_TABLET | Freq: Two times a day (BID) | ORAL | 0 refills | Status: AC

## 2022-12-13 MED ORDER — NAPROXEN 500 MG PO TABS
500.0000 mg | ORAL_TABLET | Freq: Once | ORAL | Status: AC
  Administered 2022-12-13: 500 mg via ORAL
  Filled 2022-12-13: qty 1

## 2022-12-13 NOTE — Discharge Instructions (Addendum)
As discussed, I have sent a prescription for Augmentin to your pharmacy.  This medication twice a day for the next 10 days.  Please complete full course of the medication to ensure resolution of infection.  Alternate between Tylenol and ibuprofen every 4 hours as needed for pain and swelling.  You can also apply ice to your cheek to help with swelling.  I have attached information for local dentists in [place], please call on Monday to make an appointment for further evaluation of your dental abscess.  Get help right away if: You have a fever or chills. Your symptoms suddenly get worse. You have a very bad headache. You have problems breathing or swallowing. You have trouble opening your mouth. You have swelling in your neck or close to your eye.

## 2022-12-13 NOTE — ED Provider Notes (Cosign Needed Addendum)
[place] [facility] AT [facility] Provider Note   CSN: [id number] Arrival date & time: 12/13/22  1204     History  Chief Complaint  Patient presents with   Dental Pain    Marvin Thomas is a 57 y.o. male with a history of anemia, COPD, GERD, and metastatic colon cancer who presents to the ED today for dental pain.  Patient reports pain to the left lower jaw with swelling for the past 4 days.  He has tried some OTC medication without relief.  Patient states that he is trying to avoid eating and drinking with the left side of his mouth second to the pain.  No fever, voice changes, difficulty swallowing, drooling, or neck pain.  Patient has never experienced anything like this before.  He does not have a dentist that he is established with.  No other complaints or concerns at this time.   Home Medications Prior to Admission medications   Medication Sig Start Date End Date Taking? Authorizing Provider  amoxicillin-clavulanate (AUGMENTIN) 875-125 MG tablet Take 1 tablet by mouth every 12 (twelve) hours for 10 days. 12/13/22 12/23/22 Yes Maxwell Marion, PA-C  acetaminophen (TYLENOL) 500 MG tablet Take 1,000 mg by mouth every 6 (six) hours as needed for mild pain or headache.    [provider]  BENADRYL ALLERGY 25 MG tablet Take 25-50 mg by mouth every 6 (six) hours as needed for sleep.    [provider]  cyanocobalamin 1000 MCG tablet Take 1 tablet (1,000 mcg total) by mouth daily. 10/25/22   Lewie Chamber, MD  lidocaine-prilocaine (EMLA) cream Apply to affected area once 10/31/22   [name], MD  ondansetron (ZOFRAN) 8 MG tablet Take 1 tablet (8 mg total) by mouth every 8 (eight) hours as needed for nausea or vomiting. Start on the third day after chemotherapy. 10/31/22   [name], MD  pantoprazole (PROTONIX) 40 MG tablet Take 1 tablet (40 mg total) by mouth daily. 10/24/22   Lewie Chamber, MD  prochlorperazine (COMPAZINE) 10 MG tablet Take 1 tablet (10 mg  total) by mouth every 6 (six) hours as needed for nausea or vomiting. 10/31/22   [name], MD      Allergies    Aspirin    Review of Systems   Review of Systems  HENT:  Positive for dental problem.   All other systems reviewed and are negative.   Physical Exam Updated Vital Signs BP (!) 164/90 (BP Location: Right Arm)   Pulse 72   Temp 98.1 F (36.7 C) (Oral)   Resp 18   SpO2 100%  Physical Exam Vitals and nursing note reviewed.  Constitutional:      Appearance: Normal appearance.  HENT:     Head: Normocephalic and atraumatic.     Mouth/Throat:     Mouth: Mucous membranes are moist.     Comments: Abscess present at left lower gums near tooth space #20 with gingival edema. No angioedema, swelling of the soft or hard palate. No trismus, drooling, or muffled voice. Eyes:     Conjunctiva/sclera: Conjunctivae normal.     Pupils: Pupils are equal, round, and reactive to light.  Cardiovascular:     Rate and Rhythm: Normal rate and regular rhythm.     Pulses: Normal pulses.     Heart sounds: Normal heart sounds.  Pulmonary:     Effort: Pulmonary effort is normal.     Breath sounds: Normal breath sounds.  Musculoskeletal:  Cervical back: Normal range of motion and neck supple. No rigidity or tenderness.  Skin:    General: Skin is warm and dry.     Comments: Swelling present at left mandible  Neurological:     Mental Status: He is alert.  Psychiatric:        Mood and Affect: Mood normal.        Behavior: Behavior normal.     ED Results / Procedures / Treatments   Labs (all labs ordered are listed, but only abnormal results are displayed) Labs Reviewed - No data to display  EKG None  Radiology No results found.  Procedures Procedures: not indicated.   Medications Ordered in ED Medications  naproxen (NAPROSYN) tablet 500 mg (500 mg Oral Given 12/13/22 1339)    ED Course/ Medical Decision Making/ A&P                                 Medical Decision  Making Risk Prescription drug management.   This patient presents to the ED for concern of dental pain, this involves an extensive number of treatment options, and is a complaint that carries with it a high risk of complications and morbidity.   Differential diagnosis includes: Dental fracture, dental abscess, gingivitis, dental caries, Ludwig's angina, PTA, retropharyngeal abscess, etc.   Comorbidities  See HPI above   Additional History  Additional history obtained from previous records.   Problem List / ED Course / Critical Interventions / Medication Management  Dental pain I ordered medications including: Naproxen for pain prior to discharge. Denied ice for cheek swelling. I have reviewed the patients home medicines and have made adjustments as needed   Social Determinants of Health  Access to healthcare   Test / Admission - Considered  Discussed physical exam findings with patient. He is hemodynamically stable and safe for discharge home. Prescription for Augmentin sent to the pharmacy. Resource for the local dentist provided.  Instructed to call to make appointment for reevaluation. Return precautions provided.       Final Clinical Impression(s) / ED Diagnoses Final diagnoses:  Dental abscess    Rx / DC Orders ED Discharge Orders          Ordered    amoxicillin-clavulanate (AUGMENTIN) 875-125 MG tablet  Every 12 hours        12/13/22 1306              Maxwell Marion, PA-C 12/13/22 1409    Maxwell Marion, PA-C 12/13/22 1411    Lorre Nick, MD 12/15/22 [phone]

## 2022-12-13 NOTE — ED Triage Notes (Signed)
Pt presents with c/o dental pain on the left side of his face. Pt has significant jaw swelling.

## 2022-12-15 MED FILL — Dexamethasone Sodium Phosphate Inj 100 MG/10ML: INTRAMUSCULAR | Qty: 1 | Status: AC

## 2022-12-15 NOTE — Assessment & Plan Note (Signed)
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  His workup in the hospital revealed metastatic colon cancer to liver and lung.   -Although his biopsy of the cecal mass was negative for malignant cells, his liver biopsy confirmed adenocarcinoma and immunostain are consistent with colorectal primary.  Based on the colonoscopy findings, and severe iron deficiency, this is consistent with metastatic colon cancer -I recommend systemic therapy with FOLFOX and beva. -The goal of chemotherapy is palliative, to prolong his life and improve cancer related symptoms. -I also reviewed the NGS testing results with patient, which showed MSI stable disease, K-ras mutation positive.  He is not a candidate for EGFR inhibitor  -He is not a candidate for immunotherapy as first-line, but he does have high tumor mutation burden, and is a candidate for immunotherapy in the later line setting.  -He started FOLFOX on 11/18/2022, tolerated first cycle well. Beva was added on cycle 2

## 2022-12-16 ENCOUNTER — Encounter: Payer: Self-pay | Admitting: Hematology

## 2022-12-16 ENCOUNTER — Telehealth: Payer: Self-pay

## 2022-12-16 ENCOUNTER — Inpatient Hospital Stay (HOSPITAL_BASED_OUTPATIENT_CLINIC_OR_DEPARTMENT_OTHER): Payer: No Typology Code available for payment source | Admitting: Hematology

## 2022-12-16 ENCOUNTER — Inpatient Hospital Stay: Payer: No Typology Code available for payment source

## 2022-12-16 VITALS — BP 119/72 | HR 57

## 2022-12-16 VITALS — BP 117/68 | HR 58 | Temp 98.0°F | Resp 18 | Ht 69.0 in | Wt 133.9 lb

## 2022-12-16 DIAGNOSIS — C787 Secondary malignant neoplasm of liver and intrahepatic bile duct: Secondary | ICD-10-CM

## 2022-12-16 DIAGNOSIS — D500 Iron deficiency anemia secondary to blood loss (chronic): Secondary | ICD-10-CM

## 2022-12-16 DIAGNOSIS — C189 Malignant neoplasm of colon, unspecified: Secondary | ICD-10-CM

## 2022-12-16 DIAGNOSIS — Z5112 Encounter for antineoplastic immunotherapy: Secondary | ICD-10-CM | POA: Diagnosis not present

## 2022-12-16 LAB — CBC WITH DIFFERENTIAL (CANCER CENTER ONLY)
Abs Immature Granulocytes: 0.01 10*3/uL (ref 0.00–0.07)
Basophils Absolute: 0 10*3/uL (ref 0.0–0.1)
Basophils Relative: 1 %
Eosinophils Absolute: 0.1 10*3/uL (ref 0.0–0.5)
Eosinophils Relative: 1 %
HCT: 33.6 % — ABNORMAL LOW (ref 39.0–52.0)
Hemoglobin: 10.6 g/dL — ABNORMAL LOW (ref 13.0–17.0)
Immature Granulocytes: 0 %
Lymphocytes Relative: 25 %
Lymphs Abs: 1.4 10*3/uL (ref 0.7–4.0)
MCH: 24.3 pg — ABNORMAL LOW (ref 26.0–34.0)
MCHC: 31.5 g/dL (ref 30.0–36.0)
MCV: 77.1 fL — ABNORMAL LOW (ref 80.0–100.0)
Monocytes Absolute: 0.5 10*3/uL (ref 0.1–1.0)
Monocytes Relative: 10 %
Neutro Abs: 3.5 10*3/uL (ref 1.7–7.7)
Neutrophils Relative %: 63 %
Platelet Count: 250 10*3/uL (ref 150–400)
RBC: 4.36 MIL/uL (ref 4.22–5.81)
RDW: 16.3 % — ABNORMAL HIGH (ref 11.5–15.5)
WBC Count: 5.5 10*3/uL (ref 4.0–10.5)
nRBC: 0 % (ref 0.0–0.2)

## 2022-12-16 LAB — CMP (CANCER CENTER ONLY)
ALT: 10 U/L (ref 0–44)
AST: 19 U/L (ref 15–41)
Albumin: 3.7 g/dL (ref 3.5–5.0)
Alkaline Phosphatase: 96 U/L (ref 38–126)
Anion gap: 6 (ref 5–15)
BUN: 27 mg/dL — ABNORMAL HIGH (ref 6–20)
CO2: 25 mmol/L (ref 22–32)
Calcium: 9 mg/dL (ref 8.9–10.3)
Chloride: 106 mmol/L (ref 98–111)
Creatinine: 0.93 mg/dL (ref 0.61–1.24)
GFR, Estimated: >60 mL/min (ref >60)
Glucose, Bld: 81 mg/dL (ref 70–99)
Potassium: 4 mmol/L (ref 3.5–5.1)
Sodium: 137 mmol/L (ref 135–145)
Total Bilirubin: 0.3 mg/dL (ref 0.3–1.2)
Total Protein: 7.2 g/dL (ref 6.5–8.1)

## 2022-12-16 LAB — TOTAL PROTEIN, URINE DIPSTICK: Protein, ur: NEGATIVE mg/dL

## 2022-12-16 MED ORDER — SODIUM CHLORIDE 0.9 % IV SOLN: Freq: Once | INTRAVENOUS | Status: AC

## 2022-12-16 MED ORDER — DEXTROSE 5 % IV SOLN: Freq: Once | INTRAVENOUS | Status: AC

## 2022-12-16 MED ORDER — SODIUM CHLORIDE 0.9 % IV SOLN
2400.0000 mg/m2 | INTRAVENOUS | Status: DC
  Administered 2022-12-16: 4200 mg via INTRAVENOUS
  Filled 2022-12-16: qty 84

## 2022-12-16 MED ORDER — OXALIPLATIN CHEMO INJECTION 100 MG/20ML
85.0000 mg/m2 | Freq: Once | INTRAVENOUS | Status: AC
  Administered 2022-12-16: 150 mg via INTRAVENOUS
  Filled 2022-12-16: qty 10

## 2022-12-16 MED ORDER — SODIUM CHLORIDE 0.9% FLUSH
10.0000 mL | INTRAVENOUS | Status: DC | PRN
  Administered 2022-12-16: 10 mL

## 2022-12-16 MED ORDER — SODIUM CHLORIDE 0.9% FLUSH
10.0000 mL | INTRAVENOUS | Status: DC | PRN
  Administered 2022-12-16: 10 mL via INTRAVENOUS

## 2022-12-16 MED ORDER — PALONOSETRON HCL INJECTION 0.25 MG/5ML
0.2500 mg | Freq: Once | INTRAVENOUS | Status: AC
  Administered 2022-12-16: 0.25 mg via INTRAVENOUS
  Filled 2022-12-16: qty 5

## 2022-12-16 MED ORDER — SODIUM CHLORIDE 0.9 % IV SOLN
5.0000 mg/kg | Freq: Once | INTRAVENOUS | Status: AC
  Administered 2022-12-16: 300 mg via INTRAVENOUS
  Filled 2022-12-16: qty 12

## 2022-12-16 MED ORDER — LEUCOVORIN CALCIUM INJECTION 350 MG
400.0000 mg/m2 | Freq: Once | INTRAVENOUS | Status: AC
  Administered 2022-12-16: 696 mg via INTRAVENOUS
  Filled 2022-12-16 (×2): qty 34.8

## 2022-12-16 MED ORDER — SODIUM CHLORIDE 0.9 % IV SOLN
10.0000 mg | Freq: Once | INTRAVENOUS | Status: AC
  Administered 2022-12-16: 10 mg via INTRAVENOUS
  Filled 2022-12-16: qty 10

## 2022-12-16 NOTE — Telephone Encounter (Signed)
Patient seen by Dr. America Brown are within treatment parameters.  Labs reviewed: and are within treatment parameters.  Per physician team, patient is ready for treatment and there are NO modifications to the treatment plan.

## 2022-12-16 NOTE — Progress Notes (Signed)
[facility]   Telephone:(336) [phone] Fax:(336) [phone]   Clinic Follow up Note   Patient Care Team: Pcp, No as PCP - General Patient, No Pcp Per (General Practice) [name], MD as Consulting Physician (Hematology and Oncology) Pickenpack-Cousar, [name], NP as Nurse Practitioner [facility] and Palliative Medicine)  Date of Service:  12/16/2022  CHIEF COMPLAINT: f/u of metastatic colon cancer  CURRENT THERAPY:  First-line chemotherapy FOLFOX and bevacizumab, started on November 18, 2022  Oncology History   Metastatic colon cancer to liver (HCC) -stage IV with liver and lung metastasis, MMR normal, KRAS G12A mutation(+), TMB 10.5  -Patient presented with worsening abdominal pain for the past 3 to 4 months.  His workup in the hospital revealed metastatic colon cancer to liver and lung.   -Although his biopsy of the cecal mass was negative for malignant cells, his liver biopsy confirmed adenocarcinoma and immunostain are consistent with colorectal primary.  Based on the colonoscopy findings, and severe iron deficiency, this is consistent with metastatic colon cancer -I recommend systemic therapy with FOLFOX and beva. -The goal of chemotherapy is palliative, to prolong his life and improve cancer related symptoms. -I also reviewed the NGS testing results with patient, which showed MSI stable disease, K-ras mutation positive.  He is not a candidate for EGFR inhibitor  -He is not a candidate for immunotherapy as first-line, but he does have high tumor mutation burden, and is a candidate for immunotherapy in the later line setting.  -He started FOLFOX on 11/18/2022, tolerated first cycle well. Beva was added on cycle 2      Assessment and Plan    Metastatic Colon Cancer Tolerating chemotherapy well with no reported side effects of nausea, diarrhea, or appetite loss. Noted weight loss, possibly due to recent dental infection and associated difficulty eating. -Continue  current chemotherapy regimen every two weeks. -Repeat whole body scan after cycle five or six to assess treatment response.  Dental Infection Recent dental abscess treated with antibiotics. No current pain or swelling. -Encouraged to see a dentist urgently due to ongoing chemotherapy treatment. -If tooth extraction is needed, consider postponing next cycle of chemotherapy.  Pain Management No current pain reported. Previously taking oxycodone, but no longer needed. -No need for oxycodone refill.  Follow-up Next chemotherapy treatment scheduled for October 8 and October 22. -Return in two weeks for follow-up.         SUMMARY OF ONCOLOGIC HISTORY: Oncology History Overview Note   Cancer Staging  Metastatic colon cancer to liver [facility]) Staging form: Colon and Rectum, AJCC 8th Edition - Clinical stage from 10/24/2022: Stage IVB (cTX, cN0, pM1b) - Signed by [name], MD on 10/31/2022 Total positive nodes: 0     Metastatic colon cancer to liver (HCC)  10/17/2022 Imaging   CT CHEST W CONTRAST   IMPRESSION: 3 cm mass in the posterior right upper lobe with some dystrophic calcification. In addition there is an abnormal precarinal lymph node. Malignancy is possible. Recommend further workup such as PET-CT scan when clinically appropriate.   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 The previous larger bulla in the right lung apex is not seen on the current study.   Coronary artery calcifications. Please correlate for other coronary risk factors.   Aortic Atherosclerosis (ICD10-I70.0) and Emphysema (ICD10-J43.9).     10/24/2022 Cancer Staging   Staging form: Colon and Rectum, AJCC 8th Edition - Clinical stage from 10/24/2022: Stage IVB (cTX, cN0, pM1b) - Signed by [name], MD on  10/31/2022 Total positive nodes: 0   10/29/2022 Imaging   C ABDOMEN PELVIS W CONTRAST   IMPRESSION: 1. 3.3 x 3.2 x 2.6 cm rounded masslike wall abnormality in the mid transverse colon  just left of the midline, suspected to be a primary tumor. This fills most of luminal volume but there is no evidence of an upstream mechanical obstruction. 2. Four metastatic liver masses are again noted, with the largest lesion in segment 8 measuring 5.1 cm, previously 4.3 cm. 3. Mesenteric edema which could be congestive, due to malnutrition or hepatic dysfunction. Some of this is seen adjacent to the pancreas. It is possible the patient could have pancreatitis but no peripancreatic fluid or ductal dilatation is seen. Correlate with serum lipase. 4. Diverticulosis and moderate fecal stasis. 5. Aortic atherosclerosis.   10/31/2022 Initial Diagnosis   Metastatic colon cancer to liver [facility])   11/06/2022 Miscellaneous      11/18/2022 -  Chemotherapy   Patient is on Treatment Plan : COLORECTAL FOLFOX + Bevacizumab q14d        Discussed the use of AI scribe software for clinical note transcription with the patient, who gave verbal consent to proceed.  History of Present Illness   The patient, a 57 year old with metastatic colon cancer, presents for a follow-up visit after two cycles of chemotherapy. He reports tolerating the chemotherapy well, with no significant side effects such as nausea, diarrhea, or appetite changes. However, he has experienced cold sensitivity, describing it as feeling like something is cutting on his hand when he holds cold items. This discomfort resolves when he stops holding the cold item.  In addition to his cancer treatment, the patient has recently experienced a dental abscess, which caused significant discomfort and led to a visit to the emergency department. He was prescribed antibiotics, which he has been taking for a couple of days. The abscess has since ruptured and the pain has resolved.  The patient has also experienced significant weight loss, which he attributes to his inability to eat due to the dental abscess. He reports that he is now able to eat, but  has not yet regained his previous weight.         All other systems were reviewed with the patient and are negative.  MEDICAL HISTORY:  Past Medical History:  Diagnosis Date   Anemia Dx 2014   Gallstones    Hemothorax    Ulcer     SURGICAL HISTORY: Past Surgical History:  Procedure Laterality Date   BIOPSY  10/18/2022   Procedure: BIOPSY;  Surgeon: Kathi Der, MD;  Location: [facility];  Service: Gastroenterology;;   BIOPSY  10/24/2022   Procedure: BIOPSY;  Surgeon: Kerin Salen, MD;  [facility]: [facility];  Service: Gastroenterology;;   COLONOSCOPY WITH PROPOFOL N/A 10/18/2022   Procedure: COLONOSCOPY WITH PROPOFOL;  Surgeon: Kathi Der, MD;  [facility];  Service: Gastroenterology;  Laterality: N/A;   COLONOSCOPY WITH PROPOFOL N/A 10/24/2022   Procedure: COLONOSCOPY WITH PROPOFOL;  Surgeon: Kerin Salen, MD;  [facility];  Service: Gastroenterology;  Laterality: N/A;   ESOPHAGOGASTRODUODENOSCOPY (EGD) WITH PROPOFOL N/A 10/18/2022   Procedure: ESOPHAGOGASTRODUODENOSCOPY (EGD) WITH PROPOFOL;  Surgeon: Kathi Der, MD;  [facility];  Service: Gastroenterology;  Laterality: N/A;   IR IMAGING GUIDED PORT INSERTION  11/12/2022   LAPAROTOMY N/A 12/09/2017   Procedure: EXPLORATORY LAPAROTOMY, REPAIR PERFORATED PYLEORIC ULCER;  Surgeon: Almond Lint, MD;  Location: [facility];  Service: General;  Laterality: N/A;   POLYPECTOMY  10/24/2022  Procedure: POLYPECTOMY;  Surgeon: Kerin Salen, MD;  Location[facility];  Service: Gastroenterology;;   SUBMUCOSAL TATTOO INJECTION  10/18/2022   Procedure: SUBMUCOSAL TATTOO INJECTION;  Surgeon: Kathi Der, MD;  [facility];  Service: Gastroenterology;;    I have reviewed the social history and family history with the patient and they are unchanged from previous note.  ALLERGIES:  is allergic to aspirin.  MEDICATIONS:  Current Outpatient Medications  Medication Sig Dispense Refill    acetaminophen (TYLENOL) 500 MG tablet Take 1,000 mg by mouth every 6 (six) hours as needed for mild pain or headache.     amoxicillin-clavulanate (AUGMENTIN) 875-125 MG tablet Take 1 tablet by mouth every 12 (twelve) hours for 10 days. 20 tablet 0   BENADRYL ALLERGY 25 MG tablet Take 25-50 mg by mouth every 6 (six) hours as needed for sleep.     cyanocobalamin 1000 MCG tablet Take 1 tablet (1,000 mcg total) by mouth daily.     lidocaine-prilocaine (EMLA) cream Apply to affected area once 30 g 3   ondansetron (ZOFRAN) 8 MG tablet Take 1 tablet (8 mg total) by mouth every 8 (eight) hours as needed for nausea or vomiting. Start on the third day after chemotherapy. 30 tablet 1   pantoprazole (PROTONIX) 40 MG tablet Take 1 tablet (40 mg total) by mouth daily. 30 tablet 3   prochlorperazine (COMPAZINE) 10 MG tablet Take 1 tablet (10 mg total) by mouth every 6 (six) hours as needed for nausea or vomiting. 30 tablet 1   No current facility-administered medications for this visit.    PHYSICAL EXAMINATION: ECOG PERFORMANCE STATUS: 1 - Symptomatic but completely ambulatory  Vitals:   12/16/22 0915  BP: 117/68  Pulse: (!) 58  Resp: 18  Temp: 98 F (36.7 C)  SpO2: 100%   Wt Readings from Last 3 Encounters:  12/16/22 133 lb 14.4 oz (60.7 kg)  12/02/22 147 lb 4.8 oz (66.8 kg)  11/20/22 137 lb 6.4 oz (62.3 kg)     GENERAL:alert, no distress and comfortable SKIN: skin color, texture, turgor are normal, no rashes or significant lesions EYES: normal, Conjunctiva are pink and non-injected, sclera clear NECK: supple, thyroid normal size, non-tender, without nodularity LYMPH:  no palpable lymphadenopathy in the cervical, axillary  LUNGS: clear to auscultation and percussion with normal breathing effort HEART: regular rate & rhythm and no murmurs and no lower extremity edema ABDOMEN:abdomen soft, non-tender and normal bowel sounds Musculoskeletal:no cyanosis of digits and no clubbing  NEURO: alert &  oriented x 3 with fluent speech, no focal motor/sensory deficits  Physical Exam   MEASUREMENTS: WT- 134 HEENT: Left side of oral cavity inspected, no redness or swelling observed.      LABORATORY DATA:  I have reviewed the data as listed    Latest Ref Rng & Units 12/16/2022    8:56 AM 12/02/2022    8:46 AM 11/18/2022    8:58 AM  CBC  WBC 4.0 - 10.5 K/uL 5.5  4.8  5.6   Hemoglobin 13.0 - 17.0 g/dL 16.1  9.1  9.7   Hematocrit 39.0 - 52.0 % 33.6  29.0  30.9   Platelets 150 - 400 K/uL 250  220  206         Latest Ref Rng & Units 12/02/2022    8:46 AM 11/18/2022    8:58 AM 11/05/2022   11:46 AM  CMP  Glucose 70 - 99 mg/dL 84  95  85   BUN 6 - 20  mg/dL 14  24  20    Creatinine 0.61 - 1.24 mg/dL 1.61  0.96  0.45   Sodium 135 - 145 mmol/L 141  142  138   Potassium 3.5 - 5.1 mmol/L 3.8  4.3  4.1   Chloride 98 - 111 mmol/L 112  112  106   CO2 22 - 32 mmol/L 26  27  27    Calcium 8.9 - 10.3 mg/dL 8.5  9.0  8.9   Total Protein 6.5 - 8.1 g/dL 6.1  6.5  7.3   Total Bilirubin 0.3 - 1.2 mg/dL 0.2  0.3  0.5   Alkaline Phos 38 - 126 U/L 83  93  111   AST 15 - 41 U/L 13  15  15    ALT 0 - 44 U/L 8  10  11        RADIOGRAPHIC STUDIES: I have personally reviewed the radiological images as listed and agreed with the findings in the report. No results found.    Orders Placed This Encounter  Procedures   CBC with Differential (Cancer Center Only)    Standing Status:   Future    Standing Expiration Date:   02/09/2024   CMP (Cancer Center only)    Standing Status:   Future    Standing Expiration Date:   02/09/2024   Total Protein, Urine dipstick    Standing Status:   Future    Standing Expiration Date:   02/09/2024   All questions were answered. The patient knows to call the clinic with any problems, questions or concerns. No barriers to learning was detected. The total time spent in the appointment was 25 minutes.     [name], MD 12/16/2022

## 2022-12-17 ENCOUNTER — Encounter: Payer: Self-pay | Admitting: Hematology

## 2022-12-17 ENCOUNTER — Telehealth: Payer: Self-pay | Admitting: Hematology

## 2022-12-18 ENCOUNTER — Inpatient Hospital Stay: Payer: No Typology Code available for payment source

## 2022-12-18 VITALS — BP 140/69 | HR 59 | Temp 98.3°F | Resp 18

## 2022-12-18 DIAGNOSIS — C787 Secondary malignant neoplasm of liver and intrahepatic bile duct: Secondary | ICD-10-CM

## 2022-12-18 DIAGNOSIS — Z5112 Encounter for antineoplastic immunotherapy: Secondary | ICD-10-CM | POA: Diagnosis not present

## 2022-12-18 MED ORDER — HEPARIN SOD (PORK) LOCK FLUSH 100 UNIT/ML IV SOLN
500.0000 [IU] | Freq: Once | INTRAVENOUS | Status: AC | PRN
  Administered 2022-12-18: 500 [IU]

## 2022-12-18 MED ORDER — SODIUM CHLORIDE 0.9% FLUSH
10.0000 mL | INTRAVENOUS | Status: DC | PRN
  Administered 2022-12-18: 10 mL

## 2022-12-23 ENCOUNTER — Encounter: Payer: Self-pay | Admitting: Hematology

## 2022-12-25 ENCOUNTER — Encounter: Payer: Self-pay | Admitting: Hematology

## 2022-12-29 MED FILL — Dexamethasone Sodium Phosphate Inj 100 MG/10ML: INTRAMUSCULAR | Qty: 1 | Status: AC

## 2022-12-29 NOTE — Assessment & Plan Note (Signed)
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  His workup in the hospital revealed metastatic colon cancer to liver and lung.   -Although his biopsy of the cecal mass was negative for malignant cells, his liver biopsy confirmed adenocarcinoma and immunostain are consistent with colorectal primary.  Based on the colonoscopy findings, and severe iron deficiency, this is consistent with metastatic colon cancer -I recommend systemic therapy with FOLFOX and beva. -The goal of chemotherapy is palliative, to prolong his life and improve cancer related symptoms. -I also reviewed the NGS testing results with patient, which showed MSI stable disease, K-ras mutation positive.  He is not a candidate for EGFR inhibitor  -He is not a candidate for immunotherapy as first-line, but he does have high tumor mutation burden, and is a candidate for immunotherapy in the later line setting.  -He started FOLFOX on 11/18/2022, tolerated first cycle well. Beva was added on cycle 2

## 2022-12-30 ENCOUNTER — Inpatient Hospital Stay: Payer: No Typology Code available for payment source | Attending: Physician Assistant | Admitting: Hematology

## 2022-12-30 ENCOUNTER — Inpatient Hospital Stay: Payer: No Typology Code available for payment source

## 2022-12-30 VITALS — BP 145/67 | HR 54 | Temp 98.0°F | Resp 18 | Ht 69.0 in | Wt 144.6 lb

## 2022-12-30 DIAGNOSIS — E611 Iron deficiency: Secondary | ICD-10-CM | POA: Diagnosis not present

## 2022-12-30 DIAGNOSIS — C180 Malignant neoplasm of cecum: Secondary | ICD-10-CM | POA: Insufficient documentation

## 2022-12-30 DIAGNOSIS — C189 Malignant neoplasm of colon, unspecified: Secondary | ICD-10-CM

## 2022-12-30 DIAGNOSIS — I2510 Atherosclerotic heart disease of native coronary artery without angina pectoris: Secondary | ICD-10-CM | POA: Insufficient documentation

## 2022-12-30 DIAGNOSIS — G8929 Other chronic pain: Secondary | ICD-10-CM | POA: Insufficient documentation

## 2022-12-30 DIAGNOSIS — C787 Secondary malignant neoplasm of liver and intrahepatic bile duct: Secondary | ICD-10-CM | POA: Insufficient documentation

## 2022-12-30 DIAGNOSIS — F1290 Cannabis use, unspecified, uncomplicated: Secondary | ICD-10-CM | POA: Diagnosis not present

## 2022-12-30 DIAGNOSIS — K219 Gastro-esophageal reflux disease without esophagitis: Secondary | ICD-10-CM | POA: Diagnosis not present

## 2022-12-30 DIAGNOSIS — Z5111 Encounter for antineoplastic chemotherapy: Secondary | ICD-10-CM | POA: Diagnosis present

## 2022-12-30 DIAGNOSIS — J439 Emphysema, unspecified: Secondary | ICD-10-CM | POA: Diagnosis not present

## 2022-12-30 DIAGNOSIS — C7800 Secondary malignant neoplasm of unspecified lung: Secondary | ICD-10-CM | POA: Insufficient documentation

## 2022-12-30 DIAGNOSIS — R160 Hepatomegaly, not elsewhere classified: Secondary | ICD-10-CM | POA: Diagnosis not present

## 2022-12-30 DIAGNOSIS — Z79899 Other long term (current) drug therapy: Secondary | ICD-10-CM | POA: Insufficient documentation

## 2022-12-30 DIAGNOSIS — F17210 Nicotine dependence, cigarettes, uncomplicated: Secondary | ICD-10-CM | POA: Diagnosis not present

## 2022-12-30 DIAGNOSIS — K0889 Other specified disorders of teeth and supporting structures: Secondary | ICD-10-CM | POA: Diagnosis not present

## 2022-12-30 DIAGNOSIS — D500 Iron deficiency anemia secondary to blood loss (chronic): Secondary | ICD-10-CM

## 2022-12-30 DIAGNOSIS — I700 Atherosclerosis of aorta: Secondary | ICD-10-CM | POA: Diagnosis not present

## 2022-12-30 DIAGNOSIS — R110 Nausea: Secondary | ICD-10-CM | POA: Diagnosis not present

## 2022-12-30 DIAGNOSIS — Z886 Allergy status to analgesic agent status: Secondary | ICD-10-CM | POA: Insufficient documentation

## 2022-12-30 DIAGNOSIS — Z5112 Encounter for antineoplastic immunotherapy: Secondary | ICD-10-CM | POA: Diagnosis present

## 2022-12-30 LAB — COMPREHENSIVE METABOLIC PANEL
ALT: 6 U/L (ref 0–44)
AST: 13 U/L — ABNORMAL LOW (ref 15–41)
Albumin: 3.4 g/dL — ABNORMAL LOW (ref 3.5–5.0)
Alkaline Phosphatase: 79 U/L (ref 38–126)
Anion gap: 4 — ABNORMAL LOW (ref 5–15)
BUN: 22 mg/dL — ABNORMAL HIGH (ref 6–20)
CO2: 26 mmol/L (ref 22–32)
Calcium: 8.6 mg/dL — ABNORMAL LOW (ref 8.9–10.3)
Chloride: 109 mmol/L (ref 98–111)
Creatinine, Ser: 1 mg/dL (ref 0.61–1.24)
GFR, Estimated: >60 mL/min (ref >60)
Glucose, Bld: 87 mg/dL (ref 70–99)
Potassium: 3.9 mmol/L (ref 3.5–5.1)
Sodium: 139 mmol/L (ref 135–145)
Total Bilirubin: 0.3 mg/dL (ref 0.3–1.2)
Total Protein: 6.3 g/dL — ABNORMAL LOW (ref 6.5–8.1)

## 2022-12-30 LAB — CBC WITH DIFFERENTIAL/PLATELET
Abs Immature Granulocytes: 0.01 10*3/uL (ref 0.00–0.07)
Basophils Absolute: 0 10*3/uL (ref 0.0–0.1)
Basophils Relative: 0 %
Eosinophils Absolute: 0.2 10*3/uL (ref 0.0–0.5)
Eosinophils Relative: 2 %
HCT: 33.1 % — ABNORMAL LOW (ref 39.0–52.0)
Hemoglobin: 10.2 g/dL — ABNORMAL LOW (ref 13.0–17.0)
Immature Granulocytes: 0 %
Lymphocytes Relative: 20 %
Lymphs Abs: 1.3 10*3/uL (ref 0.7–4.0)
MCH: 24.4 pg — ABNORMAL LOW (ref 26.0–34.0)
MCHC: 30.8 g/dL (ref 30.0–36.0)
MCV: 79.2 fL — ABNORMAL LOW (ref 80.0–100.0)
Monocytes Absolute: 0.6 10*3/uL (ref 0.1–1.0)
Monocytes Relative: 9 %
Neutro Abs: 4.5 10*3/uL (ref 1.7–7.7)
Neutrophils Relative %: 69 %
Platelets: 199 10*3/uL (ref 150–400)
RBC: 4.18 MIL/uL — ABNORMAL LOW (ref 4.22–5.81)
RDW: 16.9 % — ABNORMAL HIGH (ref 11.5–15.5)
WBC: 6.6 10*3/uL (ref 4.0–10.5)
nRBC: 0 % (ref 0.0–0.2)

## 2022-12-30 LAB — FERRITIN: Ferritin: 76 ng/mL (ref 24–336)

## 2022-12-30 MED ORDER — SODIUM CHLORIDE 0.9% FLUSH
10.0000 mL | INTRAVENOUS | Status: DC | PRN
  Administered 2022-12-30: 10 mL via INTRAVENOUS

## 2022-12-30 MED ORDER — PROCHLORPERAZINE MALEATE 10 MG PO TABS: 10.0000 mg | ORAL_TABLET | Freq: Four times a day (QID) | ORAL | 1 refills | Status: DC | PRN

## 2022-12-30 NOTE — Progress Notes (Signed)
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  His workup in the hospital revealed metastatic colon cancer to liver and lung.   -Although his biopsy of the cecal mass was negative for malignant cells, his liver biopsy confirmed adenocarcinoma and immunostain are consistent with colorectal primary.  Based on the colonoscopy findings, and severe iron deficiency, this is consistent with metastatic colon cancer -I recommend systemic therapy with FOLFOX and beva. -The goal of chemotherapy is palliative, to prolong his life and improve cancer related symptoms. -I also reviewed the NGS testing results with patient, which showed MSI stable disease, K-ras mutation positive.  He is not a candidate for EGFR inhibitor  -He is not a candidate for immunotherapy as first-line, but he does have high tumor mutation burden, and is a candidate for immunotherapy in the later line setting.  -He started FOLFOX on 11/18/2022, tolerated first cycle well. Beva was added on cycle 2    Assessment and Plan    Metastatic Colon Cancer Stable disease with good tolerance to chemotherapy. Dental procedure planned which may impact chemotherapy schedule due to risk of infection and delayed healing. -Defer chemotherapy cycle today due to planned dental procedure. -Contact  dentist to confirm plan and discuss need for antibiotics post-procedure due to ongoing chemotherapy. -Resume chemotherapy in two weeks post dental procedure.  Dental Health Multiple teeth planned for extraction on 01/05/2023. Recent dental abscess treated with antibiotics. -Ensure patient is aware of need for antibiotics post dental procedure due to ongoing chemotherapy.  Pain Management Request for Oxycodone refill, but no current pain reported in stomach or back. Previous use for tooth pain. -Advise patient to request pain medication from dentist if needed for tooth pain. -Do not refill Oxycodone at this time.  Nausea Management Request for Compazine refill. -Refill Compazine at [facility] on [address].      Plan -My nurse called his dentist, confirmed he has multiple teeth extraction scheduled for next Monday.  Will hold chemotherapy today -Lab, follow-up in the resume chemotherapy in 2 weeks, I may hold Avastin for next treatment.   SUMMARY OF ONCOLOGIC HISTORY: Oncology History Overview Note   Cancer Staging  Metastatic colon cancer to liver [facility]) Staging form: Colon and Rectum, AJCC 8th Edition - Clinical stage from 10/24/2022: Stage IVB (cTX, cN0, pM1b) - Signed by [name], MD on 10/31/2022 Total positive nodes: 0     Metastatic colon cancer to liver (HCC)  10/17/2022 Imaging   CT CHEST W CONTRAST   IMPRESSION: 3 cm mass in the posterior right upper lobe with some dystrophic calcification. In addition there is an abnormal precarinal lymph node. Malignancy is possible. Recommend further workup such as PET-CT scan when clinically appropriate.   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 The previous larger bulla in the right lung apex is not seen on the current study.   Coronary  artery calcifications. Please correlate for other coronary risk factors.   Aortic Atherosclerosis (ICD10-I70.0) and Emphysema (ICD10-J43.9).     10/24/2022 Cancer  Staging   Staging form: Colon and Rectum, AJCC 8th Edition - Clinical stage from 10/24/2022: Stage IVB (cTX, cN0, pM1b) - Signed by [name], MD on 10/31/2022 Total positive nodes: 0   10/29/2022 Imaging   C ABDOMEN PELVIS W CONTRAST   IMPRESSION: 1. 3.3 x 3.2 x 2.6 cm rounded masslike wall abnormality in the mid transverse colon just left of the midline, suspected to be a primary tumor. This fills most of luminal volume but there is no evidence of an upstream mechanical obstruction. 2. Four metastatic liver masses are again noted, with the largest lesion in segment 8 measuring 5.1 cm, previously 4.3 cm. 3. Mesenteric edema which could be congestive, due to malnutrition or hepatic dysfunction. Some of this is seen adjacent to the pancreas. It is possible the patient could have pancreatitis but no peripancreatic fluid or ductal dilatation is seen. Correlate with serum lipase. 4. Diverticulosis and moderate fecal stasis. 5. Aortic atherosclerosis.   10/31/2022 Initial Diagnosis   Metastatic colon cancer to liver [facility])   11/06/2022 Miscellaneous      11/18/2022 -  Chemotherapy   Patient is on Treatment Plan : COLORECTAL FOLFOX + Bevacizumab q14d        Discussed the use of AI scribe software for clinical note transcription with the patient, who gave verbal consent to proceed.  History of Present Illness   The patient, a 57 year old with metastatic colon cancer, presents for a routine follow-up. He reports no new symptoms or changes in his condition since his last visit two weeks ago. He has been tolerating his chemotherapy treatment well. He has gained ten pounds since his last visit, which he attributes to improved eating after the resolution of a dental abscess. He completed a course of antibiotics for the abscess and has a dental appointment scheduled for extraction of approximately ten teeth. He reports no other changes in his health or symptoms.         All other systems were  reviewed with the patient and are negative.  MEDICAL HISTORY:  Past Medical History:  Diagnosis Date   Anemia Dx 2014   Gallstones    Hemothorax    Ulcer     SURGICAL HISTORY: Past Surgical History:  Procedure Laterality Date   BIOPSY  10/18/2022   Procedure: BIOPSY;  Surgeon: Kathi Der, MD;  [facility];  Service: Gastroenterology;;   BIOPSY  10/24/2022   Procedure: BIOPSY;  Surgeon: Kerin Salen, MD;  [facility];  Service: Gastroenterology;;   COLONOSCOPY WITH PROPOFOL N/A 10/18/2022   Procedure: COLONOSCOPY WITH PROPOFOL;  Surgeon: Kathi Der, MD;  [facility];  Service: Gastroenterology;  Laterality: N/A;   COLONOSCOPY WITH PROPOFOL N/A 10/24/2022   Procedure: COLONOSCOPY WITH PROPOFOL;  Surgeon: Kerin Salen, MD;  [facility];  Service: Gastroenterology;  Laterality: N/A;   ESOPHAGOGASTRODUODENOSCOPY (EGD) WITH PROPOFOL N/A 10/18/2022   Procedure: ESOPHAGOGASTRODUODENOSCOPY (EGD) WITH PROPOFOL;  Surgeon: Kathi Der, MD;  [facility];  Service: Gastroenterology;  Laterality: N/A;   IR IMAGING GUIDED PORT INSERTION  11/12/2022   LAPAROTOMY N/A 12/09/2017   Procedure: EXPLORATORY LAPAROTOMY, REPAIR PERFORATED PYLEORIC ULCER;  Surgeon: Almond Lint, MD;  [facility];  Service: General;  Laterality: N/A;   POLYPECTOMY  10/24/2022   Procedure: POLYPECTOMY;  Surgeon: Kerin Salen, MD;  [facility];  Service: Gastroenterology;;   Sunnie Nielsen  TATTOO INJECTION  10/18/2022   Procedure: SUBMUCOSAL TATTOO INJECTION;  Surgeon: Kathi Der, MD;  [facility];  Service: Gastroenterology;;    I have reviewed the social history and family history with the patient and they are unchanged from previous note.  ALLERGIES:  is allergic to aspirin.  MEDICATIONS:  Current Outpatient Medications  Medication Sig Dispense Refill   acetaminophen (TYLENOL) 500 MG tablet Take 1,000 mg by mouth every 6 (six)  hours as needed for mild pain or headache.     BENADRYL ALLERGY 25 MG tablet Take 25-50 mg by mouth every 6 (six) hours as needed for sleep.     cyanocobalamin 1000 MCG tablet Take 1 tablet (1,000 mcg total) by mouth daily.     lidocaine-prilocaine (EMLA) cream Apply to affected area once 30 g 3   ondansetron (ZOFRAN) 8 MG tablet Take 1 tablet (8 mg total) by mouth every 8 (eight) hours as needed for nausea or vomiting. Start on the third day after chemotherapy. 30 tablet 1   pantoprazole (PROTONIX) 40 MG tablet Take 1 tablet (40 mg total) by mouth daily. 30 tablet 3   prochlorperazine (COMPAZINE) 10 MG tablet Take 1 tablet (10 mg total) by mouth every 6 (six) hours as needed for nausea or vomiting. 30 tablet 1   No current facility-administered medications for this visit.    PHYSICAL EXAMINATION: ECOG PERFORMANCE STATUS: 1 - Symptomatic but completely ambulatory  Vitals:   12/30/22 0911  BP: (!) 145/67  Pulse: (!) 54  Resp: 18  Temp: 98 F (36.7 C)  SpO2: 100%   Wt Readings from Last 3 Encounters:  12/30/22 144 lb 9.6 oz (65.6 kg)  12/16/22 133 lb 14.4 oz (60.7 kg)  12/02/22 147 lb 4.8 oz (66.8 kg)     GENERAL:alert, no distress and comfortable SKIN: skin color, texture, turgor are normal, no rashes or significant lesions EYES: normal, Conjunctiva are pink and non-injected, sclera clear NECK: supple, thyroid normal size, non-tender, without nodularity LYMPH:  no palpable lymphadenopathy in the cervical, axillary  LUNGS: clear to auscultation and percussion with normal breathing effort HEART: regular rate & rhythm and no murmurs and no lower extremity edema ABDOMEN:abdomen soft, non-tender and normal bowel sounds Musculoskeletal:no cyanosis of digits and no clubbing  NEURO: alert & oriented x 3 with fluent speech, no focal motor/sensory deficits    LABORATORY DATA:  I have reviewed the data as listed    Latest Ref Rng & Units 12/30/2022    8:58 AM 12/16/2022    8:56 AM  12/02/2022    8:46 AM  CBC  WBC 4.0 - 10.5 K/uL 6.6  5.5  4.8   Hemoglobin 13.0 - 17.0 g/dL 40.9  81.1  9.1   Hematocrit 39.0 - 52.0 % 33.1  33.6  29.0   Platelets 150 - 400 K/uL 199  250  220         Latest Ref Rng & Units 12/30/2022    8:58 AM 12/16/2022    8:56 AM 12/02/2022    8:46 AM  CMP  Glucose 70 - 99 mg/dL 87  81  84   BUN 6 - 20 mg/dL 22  27  14    Creatinine 0.61 - 1.24 mg/dL 9.14  7.82  9.56   Sodium 135 - 145 mmol/L 139  137  141   Potassium 3.5 - 5.1 mmol/L 3.9  4.0  3.8   Chloride 98 - 111 mmol/L 109  106  112   CO2 22 -  32 mmol/L 26  25  26    Calcium 8.9 - 10.3 mg/dL 8.6  9.0  8.5   Total Protein 6.5 - 8.1 g/dL 6.3  7.2  6.1   Total Bilirubin 0.3 - 1.2 mg/dL 0.3  0.3  0.2   Alkaline Phos 38 - 126 U/L 79  96  83   AST 15 - 41 U/L 13  19  13    ALT 0 - 44 U/L 6  10  8        RADIOGRAPHIC STUDIES: I have personally reviewed the radiological images as listed and agreed with the findings in the report. No results found.    Orders Placed This Encounter  Procedures   CBC with Differential (Cancer Center Only)    Standing Status:   Future    Standing Expiration Date:   01/13/2024   CMP (Cancer Center only)    Standing Status:   Future    Standing Expiration Date:   01/13/2024   All questions were answered. The patient knows to call the clinic with any problems, questions or concerns. No barriers to learning was detected. The total time spent in the appointment was 30 minutes.     [name], MD 12/30/2022

## 2022-12-30 NOTE — Progress Notes (Signed)
Per Dr. Latanya Maudlin request, contacted pt's dentist office at [phone]) regarding pt's upcoming procedure on 01/05/2023.  Dental office informed this nurse that pt will have 10 teeth extractions.  Explained to dental office that pt is actively receiving chemotherapy and the dental office would require a clearance from Dr. Mosetta Putt to proceed with the extractions since the pt is actively receiving chemotherapy.  Dental office stated they were not informed by the pt that he was actively receiving chemotherapy.  Requested if the dental office could fax Dr. Latanya Maudlin office the procedure details.  Provided Dr. Latanya Maudlin fax number.  Dental office stated they would fax this office the procedure details.  Dr. Mosetta Putt made aware and chemotherapy placed on hold today.  Per Dr. Mosetta Putt, pt will resume tx 2 wks after the procedure.

## 2023-01-01 ENCOUNTER — Inpatient Hospital Stay: Payer: No Typology Code available for payment source

## 2023-01-09 ENCOUNTER — Encounter: Payer: Self-pay | Admitting: Hematology

## 2023-01-12 NOTE — Progress Notes (Unsigned)
Patient Care Team: Pcp, No as PCP - General Patient, No Pcp Per (General Practice) [name], MD as Consulting Physician (Hematology and Oncology) Pickenpack-Cousar, [name], NP as Nurse Practitioner (Hospice and Palliative Medicine)   CHIEF COMPLAINT: Follow-up metastatic colon cancer  Oncology History Overview Note   Cancer Staging  Metastatic colon cancer to liver [facility] Staging form: Colon and Rectum, AJCC 8th Edition - Clinical stage from 10/24/2022: Stage IVB (cTX, cN0, pM1b) - Signed by [name], MD on 10/31/2022 Total positive nodes: 0     Metastatic colon cancer to liver (HCC)  10/17/2022 Imaging   CT CHEST W CONTRAST   IMPRESSION: 3 cm mass in the posterior right upper lobe with some dystrophic calcification. In addition there is an abnormal precarinal lymph node. Malignancy is possible. Recommend further workup such as PET-CT scan when clinically appropriate.   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 The previous larger bulla in the right lung apex is not seen on the current study.   Coronary artery calcifications. Please correlate for other coronary risk factors.   Aortic Atherosclerosis (ICD10-I70.0) and Emphysema (ICD10-J43.9).     10/24/2022 Cancer Staging   Staging form: Colon and Rectum, AJCC 8th Edition - Clinical stage from 10/24/2022: Stage IVB (cTX, cN0, pM1b) - Signed by [name], MD on 10/31/2022 Total positive nodes: 0   10/29/2022 Imaging   C ABDOMEN PELVIS W CONTRAST   IMPRESSION: 1. 3.3 x 3.2 x 2.6 cm rounded masslike wall abnormality in the mid transverse colon just left of the midline, suspected to be a primary tumor. This fills most of luminal volume but there is no evidence of an upstream mechanical obstruction. 2. Four metastatic liver masses are again noted, with the largest lesion in segment 8 measuring 5.1 cm, previously 4.3 cm. 3. Mesenteric edema which could be congestive, due to malnutrition or hepatic  dysfunction. Some of this is seen adjacent to the pancreas. It is possible the patient could have pancreatitis but no peripancreatic fluid or ductal dilatation is seen. Correlate with serum lipase. 4. Diverticulosis and moderate fecal stasis. 5. Aortic atherosclerosis.   10/31/2022 Initial Diagnosis   Metastatic colon cancer to liver (HCC)   11/06/2022 Miscellaneous      11/18/2022 -  Chemotherapy   Patient is on Treatment Plan : COLORECTAL FOLFOX + Bevacizumab q14d        CURRENT THERAPY: First-line FOLFOX q. 14 days, starting 11/18/2022, Bev added on cycle 2  INTERVAL HISTORY Mr. Bickmore returns for follow-up and treatment as scheduled, last seen by Dr. Mosetta Putt 12/30/2022; chemo was held for upcoming dental extractions.  Here for cycle 4  ROS   Past Medical History:  Diagnosis Date   Anemia Dx 2014   Gallstones    Hemothorax    Ulcer      Past Surgical History:  Procedure Laterality Date   BIOPSY  10/18/2022   Procedure: BIOPSY;  Surgeon: Kathi Der, MD;  Location: [facility];  Service: Gastroenterology;;   BIOPSY  10/24/2022   Procedure: BIOPSY;  Surgeon: Kerin Salen, MD;  Location: [facility];  Service: Gastroenterology;;   COLONOSCOPY WITH PROPOFOL N/A 10/18/2022   Procedure: COLONOSCOPY WITH PROPOFOL;  Surgeon: Kathi Der, MD;  Location: [facility];  Service: Gastroenterology;  Laterality: N/A;   COLONOSCOPY WITH PROPOFOL N/A 10/24/2022   Procedure: COLONOSCOPY WITH PROPOFOL;  Surgeon: Kerin Salen, MD;  Location: [facility];  Service: Gastroenterology;  Laterality: N/A;   ESOPHAGOGASTRODUODENOSCOPY (EGD) WITH PROPOFOL N/A 10/18/2022  Procedure: ESOPHAGOGASTRODUODENOSCOPY (EGD) WITH PROPOFOL;  Surgeon: Kathi Der, MD;  Location: [facility];  Service: Gastroenterology;  Laterality: N/A;   IR IMAGING GUIDED PORT INSERTION  11/12/2022   LAPAROTOMY N/A 12/09/2017   Procedure: EXPLORATORY LAPAROTOMY, REPAIR PERFORATED PYLEORIC ULCER;  Surgeon: Almond Lint, MD;  Location: [facility];  Service: General;  Laterality: N/A;   POLYPECTOMY  10/24/2022   Procedure: POLYPECTOMY;  Surgeon: Kerin Salen, MD;  Location: [facility];  Service: Gastroenterology;;   SUBMUCOSAL TATTOO INJECTION  10/18/2022   Procedure: SUBMUCOSAL TATTOO INJECTION;  Surgeon: Kathi Der, MD;  Location: [facility];  Service: Gastroenterology;;     Outpatient Encounter Medications as of 01/13/2023  Medication Sig Note   acetaminophen (TYLENOL) 500 MG tablet Take 1,000 mg by mouth every 6 (six) hours as needed for mild pain or headache.    BENADRYL ALLERGY 25 MG tablet Take 25-50 mg by mouth every 6 (six) hours as needed for sleep.    cyanocobalamin 1000 MCG tablet Take 1 tablet (1,000 mcg total) by mouth daily. 10/29/2022: Hasn't started    lidocaine-prilocaine (EMLA) cream Apply to affected area once 11/12/2022: Not start   ondansetron (ZOFRAN) 8 MG tablet Take 1 tablet (8 mg total) by mouth every 8 (eight) hours as needed for nausea or vomiting. Start on the third day after chemotherapy.    pantoprazole (PROTONIX) 40 MG tablet Take 1 tablet (40 mg total) by mouth daily. 10/29/2022: Hasn't started    prochlorperazine (COMPAZINE) 10 MG tablet Take 1 tablet (10 mg total) by mouth every 6 (six) hours as needed for nausea or vomiting.    No facility-administered encounter medications on file as of 01/13/2023.     There were no vitals filed for this visit. There is no height or weight on file to calculate BMI.   PHYSICAL EXAM GENERAL:alert, no distress and comfortable SKIN: no rash  EYES: sclera clear NECK: without mass LYMPH:  no palpable cervical or supraclavicular lymphadenopathy  LUNGS: clear with normal breathing effort HEART: regular rate & rhythm, no lower extremity edema ABDOMEN: abdomen soft, non-tender and normal bowel sounds NEURO: alert & oriented x 3 with fluent speech, no focal motor/sensory deficits Breast exam:  PAC without erythema    CBC     Component Value Date/Time   WBC 6.6 12/30/2022 0858   RBC 4.18 (L) 12/30/2022 0858   HGB 10.2 (L) 12/30/2022 0858   HGB 10.6 (L) 12/16/2022 0856   HCT 33.1 (L) 12/30/2022 0858   PLT 199 12/30/2022 0858   PLT 250 12/16/2022 0856   MCV 79.2 (L) 12/30/2022 0858   MCH 24.4 (L) 12/30/2022 0858   MCHC 30.8 12/30/2022 0858   RDW 16.9 (H) 12/30/2022 0858   LYMPHSABS 1.3 12/30/2022 0858   MONOABS 0.6 12/30/2022 0858   EOSABS 0.2 12/30/2022 0858   BASOSABS 0.0 12/30/2022 0858     CMP     Component Value Date/Time   NA 139 12/30/2022 0858   K 3.9 12/30/2022 0858   CL 109 12/30/2022 0858   CO2 26 12/30/2022 0858   GLUCOSE 87 12/30/2022 0858   BUN 22 (H) 12/30/2022 0858   CREATININE 1.00 12/30/2022 0858   CREATININE 0.93 12/16/2022 0856   CREATININE 0.84 01/30/2014 1020   CALCIUM 8.6 (L) 12/30/2022 0858   PROT 6.3 (L) 12/30/2022 0858   ALBUMIN 3.4 (L) 12/30/2022 0858   AST 13 (L) 12/30/2022 0858   AST 19 12/16/2022 0856   ALT 6 12/30/2022 0858   ALT 10 12/16/2022 0856  ALKPHOS 79 12/30/2022 0858   BILITOT 0.3 12/30/2022 0858   BILITOT 0.3 12/16/2022 0856   GFRNONAA >60 12/30/2022 0858   GFRNONAA >60 12/16/2022 0856   GFRNONAA >89 01/30/2014 1020   GFRAA >60 12/13/2017 0442   GFRAA >89 01/30/2014 1020     ASSESSMENT & PLAN: 57 year old male  Metastatic colon cancer to liver, stage IV with liver and lung metastasis, MMR normal, KRAS G12A mutation(+), TMB 10.5  -Presented with worsening abdominal pain for the past 3 to 4 months.  His workup in the hospital revealed metastatic colon cancer to liver and lung.   -Although his biopsy of the cecal mass was negative for malignant cells, his liver biopsy 10/23/2022 confirmed adenocarcinoma and immunostain are consistent with colorectal primary.  Based on the colonoscopy findings, and severe iron deficiency, this is consistent with metastatic colon cancer -Baseline CEA normal, 3 -Molecular studies showed MSI stable disease, K-ras  mutation positive.  He is not a candidate for EGFR inhibitor  -He is not a candidate for immunotherapy as first-line, but he does have high tumor mutation burden, and is a candidate for immunotherapy in the later line setting.  -He started First line/palliative FOLFOX on 11/18/2022, tolerated first cycle well. Beva was added on cycle 2    PLAN:  No orders of the defined types were placed in this encounter.     All questions were answered. The patient knows to call the clinic with any problems, questions or concerns. No barriers to learning were detected. I spent *** counseling the patient face to face. The total time spent in the appointment was *** and more than 50% was on counseling, review of test results, and coordination of care.   [name], NP-C @DATE @

## 2023-01-13 ENCOUNTER — Encounter: Payer: Self-pay | Admitting: Nurse Practitioner

## 2023-01-13 ENCOUNTER — Inpatient Hospital Stay: Payer: No Typology Code available for payment source

## 2023-01-13 ENCOUNTER — Inpatient Hospital Stay (HOSPITAL_BASED_OUTPATIENT_CLINIC_OR_DEPARTMENT_OTHER): Payer: No Typology Code available for payment source | Admitting: Nurse Practitioner

## 2023-01-13 DIAGNOSIS — C787 Secondary malignant neoplasm of liver and intrahepatic bile duct: Secondary | ICD-10-CM

## 2023-01-13 DIAGNOSIS — C189 Malignant neoplasm of colon, unspecified: Secondary | ICD-10-CM | POA: Diagnosis not present

## 2023-01-13 DIAGNOSIS — C180 Malignant neoplasm of cecum: Secondary | ICD-10-CM | POA: Diagnosis not present

## 2023-01-13 LAB — CBC WITH DIFFERENTIAL (CANCER CENTER ONLY)
Abs Immature Granulocytes: 0.01 10*3/uL (ref 0.00–0.07)
Basophils Absolute: 0.1 10*3/uL (ref 0.0–0.1)
Basophils Relative: 1 %
Eosinophils Absolute: 0.5 10*3/uL (ref 0.0–0.5)
Eosinophils Relative: 9 %
HCT: 34.6 % — ABNORMAL LOW (ref 39.0–52.0)
Hemoglobin: 10.8 g/dL — ABNORMAL LOW (ref 13.0–17.0)
Immature Granulocytes: 0 %
Lymphocytes Relative: 21 %
Lymphs Abs: 1.3 10*3/uL (ref 0.7–4.0)
MCH: 24.6 pg — ABNORMAL LOW (ref 26.0–34.0)
MCHC: 31.2 g/dL (ref 30.0–36.0)
MCV: 78.8 fL — ABNORMAL LOW (ref 80.0–100.0)
Monocytes Absolute: 0.6 10*3/uL (ref 0.1–1.0)
Monocytes Relative: 10 %
Neutro Abs: 3.8 10*3/uL (ref 1.7–7.7)
Neutrophils Relative %: 59 %
Platelet Count: 187 10*3/uL (ref 150–400)
RBC: 4.39 MIL/uL (ref 4.22–5.81)
RDW: 15.6 % — ABNORMAL HIGH (ref 11.5–15.5)
WBC Count: 6.3 10*3/uL (ref 4.0–10.5)
nRBC: 0 % (ref 0.0–0.2)

## 2023-01-13 LAB — CMP (CANCER CENTER ONLY)
ALT: 7 U/L (ref 0–44)
AST: 15 U/L (ref 15–41)
Albumin: 3.5 g/dL (ref 3.5–5.0)
Alkaline Phosphatase: 83 U/L (ref 38–126)
Anion gap: 3 — ABNORMAL LOW (ref 5–15)
BUN: 17 mg/dL (ref 6–20)
CO2: 27 mmol/L (ref 22–32)
Calcium: 8.5 mg/dL — ABNORMAL LOW (ref 8.9–10.3)
Chloride: 107 mmol/L (ref 98–111)
Creatinine: 0.89 mg/dL (ref 0.61–1.24)
GFR, Estimated: >60 mL/min (ref >60)
Glucose, Bld: 119 mg/dL — ABNORMAL HIGH (ref 70–99)
Potassium: 3.3 mmol/L — ABNORMAL LOW (ref 3.5–5.1)
Sodium: 137 mmol/L (ref 135–145)
Total Bilirubin: 0.3 mg/dL (ref 0.3–1.2)
Total Protein: 6.5 g/dL (ref 6.5–8.1)

## 2023-01-13 MED ORDER — CYANOCOBALAMIN 1000 MCG PO TABS: 1000.0000 ug | ORAL_TABLET | Freq: Every day | ORAL | 2 refills | Status: AC

## 2023-01-13 MED ORDER — PANTOPRAZOLE SODIUM 40 MG PO TBEC: 40.0000 mg | DELAYED_RELEASE_TABLET | Freq: Every day | ORAL | 3 refills | Status: DC

## 2023-01-13 MED ORDER — OXALIPLATIN CHEMO INJECTION 100 MG/20ML
85.0000 mg/m2 | Freq: Once | INTRAVENOUS | Status: AC
  Administered 2023-01-13: 150 mg via INTRAVENOUS
  Filled 2023-01-13: qty 10

## 2023-01-13 MED ORDER — LEUCOVORIN CALCIUM INJECTION 350 MG
400.0000 mg/m2 | Freq: Once | INTRAVENOUS | Status: AC
  Administered 2023-01-13: 696 mg via INTRAVENOUS
  Filled 2023-01-13: qty 34.8

## 2023-01-13 MED ORDER — BENADRYL ALLERGY 25 MG PO TABS: 25.0000 mg | ORAL_TABLET | Freq: Four times a day (QID) | ORAL | 2 refills | Status: AC | PRN

## 2023-01-13 MED ORDER — DEXAMETHASONE SODIUM PHOSPHATE 10 MG/ML IJ SOLN
10.0000 mg | Freq: Once | INTRAMUSCULAR | Status: AC
  Administered 2023-01-13: 10 mg via INTRAVENOUS
  Filled 2023-01-13: qty 1

## 2023-01-13 MED ORDER — PALONOSETRON HCL INJECTION 0.25 MG/5ML
0.2500 mg | Freq: Once | INTRAVENOUS | Status: AC
  Administered 2023-01-13: 0.25 mg via INTRAVENOUS
  Filled 2023-01-13: qty 5

## 2023-01-13 MED ORDER — ACETAMINOPHEN 500 MG PO TABS: 1000.0000 mg | ORAL_TABLET | Freq: Four times a day (QID) | ORAL | 2 refills | Status: AC | PRN

## 2023-01-13 MED ORDER — POTASSIUM CHLORIDE CRYS ER 20 MEQ PO TBCR: 20.0000 meq | EXTENDED_RELEASE_TABLET | Freq: Every day | ORAL | 0 refills | Status: DC

## 2023-01-13 MED ORDER — POTASSIUM CHLORIDE CRYS ER 20 MEQ PO TBCR: 20.0000 meq | EXTENDED_RELEASE_TABLET | Freq: Two times a day (BID) | ORAL | 0 refills | Status: DC

## 2023-01-13 MED ORDER — SODIUM CHLORIDE 0.9 % IV SOLN
2400.0000 mg/m2 | INTRAVENOUS | Status: DC
  Administered 2023-01-13: 4200 mg via INTRAVENOUS
  Filled 2023-01-13: qty 84

## 2023-01-13 MED ORDER — DEXTROSE 5 % IV SOLN: Freq: Once | INTRAVENOUS | Status: AC

## 2023-01-13 NOTE — Patient Instructions (Signed)
[facility]   Discharge Instructions: Thank you for choosing [facility] to provide your oncology and hematology care.   If you have a lab appointment with the Cancer Center, please go directly to the Cancer Center and check in at the registration area.   Wear comfortable clothing and clothing appropriate for easy access to any Portacath or PICC line.   We strive to give you quality time with your provider. You may need to reschedule your appointment if you arrive late (15 or more minutes).  Arriving late affects you and other patients whose appointments are after yours.  Also, if you miss three or more appointments without notifying the office, you may be dismissed from the clinic at the provider's discretion.      For prescription refill requests, have your pharmacy contact our office and allow 72 hours for refills to be completed.    Today you received the following chemotherapy and/or immunotherapy agents: Oxaliplatin, Leucovorin, and Fluorouracil (Adrucil)   To help prevent nausea and vomiting after your treatment, we encourage you to take your nausea medication as directed.  BELOW ARE SYMPTOMS THAT SHOULD BE REPORTED IMMEDIATELY: *FEVER GREATER THAN 100.4 F (38 C) OR HIGHER *CHILLS OR SWEATING *NAUSEA AND VOMITING THAT IS NOT CONTROLLED WITH YOUR NAUSEA MEDICATION *UNUSUAL SHORTNESS OF BREATH *UNUSUAL BRUISING OR BLEEDING *URINARY PROBLEMS (pain or burning when urinating, or frequent urination) *BOWEL PROBLEMS (unusual diarrhea, constipation, pain near the anus) TENDERNESS IN MOUTH AND THROAT WITH OR WITHOUT PRESENCE OF ULCERS (sore throat, sores in mouth, or a toothache) UNUSUAL RASH, SWELLING OR PAIN  UNUSUAL VAGINAL DISCHARGE OR ITCHING   Items with * indicate a potential emergency and should be followed up as soon as possible or go to the Emergency Department if any problems should occur.  Please show the CHEMOTHERAPY ALERT  CARD or IMMUNOTHERAPY ALERT CARD at check-in to the Emergency Department and triage nurse.  Should you have questions after your visit or need to cancel or reschedule your appointment, please contact [facility]  Dept: [phone]  and follow the prompts.  Office hours are 8:00 a.m. to 4:30 p.m. Monday - Friday. Please note that voicemails left after 4:00 p.m. may not be returned until the following business day.  We are closed weekends and major holidays. You have access to a nurse at all times for urgent questions. Please call the main number to the clinic Dept: [phone] and follow the prompts.   For any non-urgent questions, you may also contact your provider using MyChart. We now offer e-Visits for anyone 61 and older to request care online for non-urgent symptoms. For details visit [email or web address].   Also download the MyChart app! Go to the app store, search "MyChart", open the app, select [place], and log in with your MyChart username and password.  The chemotherapy medication bag should finish at 46 hours, 96 hours, or 7 days. For example, if your pump is scheduled for 46 hours and it was put on at 4:00 p.m., it should finish at 2:00 p.m. the day it is scheduled to come off regardless of your appointment time.     Estimated time to finish at 9/12 @ 11:30 AM   If the display on your pump reads "Low Volume" and it is beeping, take the batteries out of the pump and come to the cancer center for it to be taken off.   If the pump alarms  go off prior to the pump reading "Low Volume" then call [phone] and someone can assist you.  If the plunger comes out and the chemotherapy medication is leaking out, please use your home chemo spill kit to clean up the spill. Do NOT use paper towels or other household products.  If you have problems or questions regarding your pump, please call either [phone] (24 hours a day) or the cancer center  Monday-Friday 8:00 a.m.- 4:30 p.m. at the clinic number and we will assist you. If you are unable to get assistance, then go to the nearest Emergency Department and ask the staff to contact the IV team for assistance.

## 2023-01-14 NOTE — Progress Notes (Unsigned)
Palliative Medicine [facility]  [phone] [phone]) [phone]   Name: Marvin Thomas Date: 01/14/2023 MRN: [id number]  DOB: 1966/03/16  Patient Care Team: Pcp, No as PCP - General Patient, No Pcp Per (General Practice) [name], MD as Consulting Physician (Hematology and Oncology) Pickenpack-Cousar, [name], NP as Nurse Practitioner [facility] and Palliative Medicine)    INTERVAL HISTORY: Marvin Thomas is a 57 y.o. male with oncologic medical history including colon cancer (10/2022) with metastatic disease to the liver and the lung, as well as peptic ulcer disease, COPD, GERD, and anemia. Palliative ask to see for symptom and pain management and goals of care.   SOCIAL HISTORY:     reports that he has been smoking cigarettes. He has a 46.5 pack-year smoking history. He uses smokeless tobacco. He reports current alcohol use. He reports current drug use. Drug: Marijuana.  ADVANCE DIRECTIVES:  None on file  CODE STATUS: Full code  PAST MEDICAL HISTORY: Past Medical History:  Diagnosis Date  . Anemia Dx 2014  . Gallstones   . Hemothorax   . Ulcer     ALLERGIES:  is allergic to aspirin.  MEDICATIONS:  Current Outpatient Medications  Medication Sig Dispense Refill  . acetaminophen (TYLENOL) 500 MG tablet Take 2 tablets (1,000 mg total) by mouth every 6 (six) hours as needed for mild pain (pain score 1-3) or headache. 30 tablet 2  . BENADRYL ALLERGY 25 MG tablet Take 1-2 tablets (25-50 mg total) by mouth every 6 (six) hours as needed for sleep. 30 tablet 2  . cyanocobalamin 1000 MCG tablet Take 1 tablet (1,000 mcg total) by mouth daily. 30 tablet 2  . lidocaine-prilocaine (EMLA) cream Apply to affected area once 30 g 3  . ondansetron (ZOFRAN) 8 MG tablet Take 1 tablet (8 mg total) by mouth every 8 (eight) hours as needed for nausea or vomiting. Start on the third day after chemotherapy. 30 tablet 1  . pantoprazole (PROTONIX) 40 MG tablet  Take 1 tablet (40 mg total) by mouth daily. 30 tablet 3  . potassium chloride SA (KLOR-CON M) 20 MEQ tablet Take 1 tablet (20 mEq total) by mouth daily. 7 tablet 0  . prochlorperazine (COMPAZINE) 10 MG tablet Take 1 tablet (10 mg total) by mouth every 6 (six) hours as needed for nausea or vomiting. 30 tablet 1   No current facility-administered medications for this visit.    VITAL SIGNS: There were no vitals taken for this visit. There were no vitals filed for this visit.  Estimated body mass index is 20.97 kg/m as calculated from the following:   Height as of 12/30/22: 5\' 9"  (1.753 m).   Weight as of 01/13/23: 142 lb (64.4 kg).   PERFORMANCE STATUS (ECOG) : 1 - Symptomatic but completely ambulatory   Physical Exam General: NAD Cardiovascular: regular rate and rhythm Pulmonary: clear ant fields Abdomen: soft, nontender, + bowel sounds Extremities: no edema, no joint deformities Skin: no rashes Neurological: AAO x4  IMPRESSION: Marvin Thomas presented to clinic for follow-up. No acute distress. Family present. Is remaining active. Appetite is good.  Eyes nausea, vomiting, constipation, or diarrhea.  States overall he is feeling well.  No insomnia.  Neoplasm related pain [name] reports his pain is minimal.  He is not having to take pain medication daily.  He has oxycodone on hand at home.  Reports last taken dose was 4 days ago.  PDMP reviewed.  Last prescription received at discharge on  8/7.  We discussed if he is not having severe pain he does not need to take medication.  Able to take Tylenol for mild to moderate aches or pain as needed.  He verbalized understanding.  Patient knows to contact office as needed.     Constipation Occasional constipation. Currently taking dulcolax every other day. Education provided on importance of bowel regimen. Encouraged daily use of Miralax.    Goals of Care  11/20/22- We discussed his current illness and what it means in the larger context  of his on-going co-morbidities. Natural disease trajectory and expectations were discussed.   Cesc and his sister are realistic in their understanding. They are clear in expressed goals to continue to treat the treatable allowing him every opportunity to continue thriving.   We discussed Her current illness and what it means in the larger context of Her on-going co-morbidities. Natural disease trajectory and expectations were discussed.  I discussed the importance of continued conversation with family and their medical providers regarding overall plan of care and treatment options, ensuring decisions are within the context of the patients values and GOCs.  PLAN:  Oxycodone as needed for pain.  Not requiring daily.  Last refill received at discharge on 8/7.   Pain contract on file Ongoing symptom management as needed.  Patient knows to contact office as needed I will plan to see patient back in 8-10 weeks as needed.    Patient expressed understanding and was in agreement with this plan. He also understands that He can call the clinic at any time with any questions, concerns, or complaints.   Any controlled substances utilized were prescribed in the context of palliative care. PDMP has been reviewed.    Visit consisted of counseling and education dealing with the complex and emotionally intense issues of symptom management and palliative care in the setting of serious and potentially life-threatening illness.Greater than 50%  of this time was spent counseling and coordinating care related to the above assessment and plan.  [name], AGPCNP-BC  Palliative Medicine Team/[facility]  *Please note that this is a verbal dictation therefore any spelling or grammatical errors are due to the "[employer] One" system interpretation.

## 2023-01-15 ENCOUNTER — Inpatient Hospital Stay: Payer: No Typology Code available for payment source

## 2023-01-15 ENCOUNTER — Inpatient Hospital Stay (HOSPITAL_BASED_OUTPATIENT_CLINIC_OR_DEPARTMENT_OTHER): Payer: No Typology Code available for payment source | Admitting: Nurse Practitioner

## 2023-01-15 ENCOUNTER — Encounter: Payer: Self-pay | Admitting: Nurse Practitioner

## 2023-01-15 VITALS — BP 128/73 | HR 53 | Temp 98.1°F | Resp 16 | Wt 141.0 lb

## 2023-01-15 VITALS — BP 158/75 | HR 57 | Temp 97.8°F | Resp 18

## 2023-01-15 DIAGNOSIS — C787 Secondary malignant neoplasm of liver and intrahepatic bile duct: Secondary | ICD-10-CM

## 2023-01-15 DIAGNOSIS — D500 Iron deficiency anemia secondary to blood loss (chronic): Secondary | ICD-10-CM

## 2023-01-15 DIAGNOSIS — Z515 Encounter for palliative care: Secondary | ICD-10-CM

## 2023-01-15 DIAGNOSIS — G893 Neoplasm related pain (acute) (chronic): Secondary | ICD-10-CM | POA: Diagnosis not present

## 2023-01-15 DIAGNOSIS — C180 Malignant neoplasm of cecum: Secondary | ICD-10-CM | POA: Diagnosis not present

## 2023-01-15 DIAGNOSIS — C189 Malignant neoplasm of colon, unspecified: Secondary | ICD-10-CM

## 2023-01-15 MED ORDER — OXYCODONE HCL 5 MG PO TABS: 5.0000 mg | ORAL_TABLET | Freq: Four times a day (QID) | ORAL | 0 refills | Status: DC | PRN

## 2023-01-15 MED ORDER — SODIUM CHLORIDE 0.9% FLUSH
10.0000 mL | INTRAVENOUS | Status: DC | PRN
  Administered 2023-01-15: 10 mL via INTRAVENOUS

## 2023-01-15 MED ORDER — HEPARIN SOD (PORK) LOCK FLUSH 100 UNIT/ML IV SOLN
500.0000 [IU] | Freq: Once | INTRAVENOUS | Status: AC
Start: 2023-01-15 — End: 2023-01-15
  Administered 2023-01-15: 500 [IU] via INTRAVENOUS

## 2023-01-26 NOTE — Assessment & Plan Note (Signed)
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  His workup in the hospital revealed metastatic colon cancer to liver and lung.   -Although his biopsy of the cecal mass was negative for malignant cells, his liver biopsy confirmed adenocarcinoma and immunostain are consistent with colorectal primary.  Based on the colonoscopy findings, and severe iron deficiency, this is consistent with metastatic colon cancer -I recommend systemic therapy with FOLFOX and beva. -The goal of chemotherapy is palliative, to prolong his life and improve cancer related symptoms. -I also reviewed the NGS testing results with patient, which showed MSI stable disease, K-ras mutation positive.  He is not a candidate for EGFR inhibitor  -He is not a candidate for immunotherapy as first-line, but he does have high tumor mutation burden, and is a candidate for immunotherapy in the later line setting.  -He started FOLFOX on 11/18/2022, tolerated first cycle well. Beva was added on cycle 2 He is overall doing well.

## 2023-01-26 NOTE — Progress Notes (Unsigned)
Palliative Medicine Marvin Thomas  [phone]) [phone] [phone]) [phone]   Name: Marvin Thomas Date: 01/26/2023 MRN: [id number]  DOB: Jun 06, 1965  Patient Care Team: Pcp, No as PCP - General Patient, No Pcp Per (General Practice) [name], MD as Consulting Physician (Hematology and Oncology) Pickenpack-Cousar, [name], NP as Nurse Practitioner (Hospice and Palliative Medicine)    INTERVAL HISTORY: Marvin Thomas is a 57 y.o. male with oncologic medical history including colon cancer (10/2022) with metastatic disease to the liver and the lung, as well as peptic ulcer disease, COPD, GERD, and anemia. Palliative ask to see for symptom and pain management and goals of care.   SOCIAL HISTORY:     reports that he has been smoking cigarettes. He has a 46.5 pack-year smoking history. He uses smokeless tobacco. He reports current alcohol use. He reports current drug use. Drug: Marijuana.  ADVANCE DIRECTIVES:  None on file  CODE STATUS: Full code  PAST MEDICAL HISTORY: Past Medical History:  Diagnosis Date  . Anemia Dx 2014  . Gallstones   . Hemothorax   . Ulcer     ALLERGIES:  is allergic to aspirin.  MEDICATIONS:  Current Outpatient Medications  Medication Sig Dispense Refill  . acetaminophen (TYLENOL) 500 MG tablet Take 2 tablets (1,000 mg total) by mouth every 6 (six) hours as needed for mild pain (pain score 1-3) or headache. 30 tablet 2  . BENADRYL ALLERGY 25 MG tablet Take 1-2 tablets (25-50 mg total) by mouth every 6 (six) hours as needed for sleep. 30 tablet 2  . cyanocobalamin 1000 MCG tablet Take 1 tablet (1,000 mcg total) by mouth daily. 30 tablet 2  . lidocaine-prilocaine (EMLA) cream Apply to affected area once 30 g 3  . ondansetron (ZOFRAN) 8 MG tablet Take 1 tablet (8 mg total) by mouth every 8 (eight) hours as needed for nausea or vomiting. Start on the third day after chemotherapy. 30 tablet 1  . oxyCODONE (OXY IR/ROXICODONE) 5 MG  immediate release tablet Take 1 tablet (5 mg total) by mouth every 6 (six) hours as needed for severe pain (pain score 7-10). 45 tablet 0  . pantoprazole (PROTONIX) 40 MG tablet Take 1 tablet (40 mg total) by mouth daily. 30 tablet 3  . potassium chloride SA (KLOR-CON M) 20 MEQ tablet Take 1 tablet (20 mEq total) by mouth daily. 7 tablet 0  . prochlorperazine (COMPAZINE) 10 MG tablet Take 1 tablet (10 mg total) by mouth every 6 (six) hours as needed for nausea or vomiting. 30 tablet 1   No current facility-administered medications for this visit.    VITAL SIGNS: There were no vitals taken for this visit. There were no vitals filed for this visit.  Estimated body mass index is 20.82 kg/m as calculated from the following:   Height as of 12/30/22: 5\' 9"  (1.753 m).   Weight as of 01/15/23: 141 lb (64 kg).   PERFORMANCE STATUS (ECOG) : 1 - Symptomatic but completely ambulatory   Physical Exam General: NAD Cardiovascular: regular rate and rhythm Pulmonary: normal breathing pattern  Extremities: no edema, no joint deformities Skin: no rashes Neurological: AAO x4  Discussed the use of AI scribe software for clinical note transcription with the patient, who gave verbal consent to proceed.   IMPRESSION: Mr. Bostic presents for a scheduled appointment to discuss pain management and medication refills. He reports that his pain level has remained consistent, with the primary location being around his port and chest  wall area. Some discomfort at times to back. The pain is described as a tight sensation, but no specific triggers have been identified.  The patient has been managing his pain with daily Tylenol and oxycodone as needed. He reports that the oxycodone is effective in relieving his severe pain when required. No adverse effects such as constipation, diarrhea, nausea, or vomiting have been reported.  Marvin Thomas's appetite is reported as normal, and he denies any sleep disturbances. He  describes his energy level as satisfactory, although he notes a decrease during the summer months. Despite this, he remains active and continues to engage in outdoor work such as yard maintenance.  The patient's medication regimen includes oxycodone, which he requests to be refilled. He has been compliant with his medication regimen and has not reported any new medications.  Goals of Care  11/20/22- We discussed his current illness and what it means in the larger context of his on-going co-morbidities. Natural disease trajectory and expectations were discussed.   Norvil and his sister are realistic in their understanding. They are clear in expressed goals to continue to treat the treatable allowing him every opportunity to continue thriving.   We discussed Marvin Thomas current illness and what it means in the larger context of Marvin Thomas on-going co-morbidities. Natural disease trajectory and expectations were discussed.  I discussed the importance of continued conversation with family and their medical providers regarding overall plan of care and treatment options, ensuring decisions are within the context of the patients values and GOCs.  PLAN:  Assessment & Plan Chronic Pain  Stable chronic pain around the port/chest wall area. No exacerbating factors. Oxycodone used as needed for pain control. -Continue current regimen of Oxycodone as needed for pain. -[facility] [address] -Check-in in a couple of weeks or sooner if any issues arise. -Next appointment scheduled for February 24, 2023.    Patient expressed understanding and was in agreement with this plan. He also understands that He can call the clinic at any time with any questions, concerns, or complaints.   Any controlled substances utilized were prescribed in the context of palliative care. PDMP has been reviewed.    Visit consisted of counseling and education dealing with the complex and emotionally intense  issues of symptom management and palliative care in the setting of serious and potentially life-threatening illness.  [name], AGPCNP-BC  Palliative Medicine Team/[facility]  *Please note that this is a verbal dictation therefore any spelling or grammatical errors are due to the "Dragon Medical One" system interpretation.

## 2023-01-27 ENCOUNTER — Encounter: Payer: Self-pay | Admitting: Nurse Practitioner

## 2023-01-27 ENCOUNTER — Inpatient Hospital Stay (HOSPITAL_BASED_OUTPATIENT_CLINIC_OR_DEPARTMENT_OTHER): Payer: No Typology Code available for payment source | Admitting: Nurse Practitioner

## 2023-01-27 ENCOUNTER — Inpatient Hospital Stay: Payer: No Typology Code available for payment source

## 2023-01-27 ENCOUNTER — Inpatient Hospital Stay (HOSPITAL_BASED_OUTPATIENT_CLINIC_OR_DEPARTMENT_OTHER): Payer: No Typology Code available for payment source | Admitting: Hematology

## 2023-01-27 ENCOUNTER — Encounter: Payer: Self-pay | Admitting: Hematology

## 2023-01-27 ENCOUNTER — Inpatient Hospital Stay: Payer: No Typology Code available for payment source | Attending: Physician Assistant

## 2023-01-27 VITALS — BP 138/65 | HR 68 | Temp 98.2°F | Resp 16 | Ht 69.0 in | Wt 142.1 lb

## 2023-01-27 VITALS — BP 150/80 | HR 70 | Temp 98.1°F | Resp 16

## 2023-01-27 DIAGNOSIS — K30 Functional dyspepsia: Secondary | ICD-10-CM | POA: Diagnosis not present

## 2023-01-27 DIAGNOSIS — Z5111 Encounter for antineoplastic chemotherapy: Secondary | ICD-10-CM | POA: Diagnosis present

## 2023-01-27 DIAGNOSIS — G893 Neoplasm related pain (acute) (chronic): Secondary | ICD-10-CM | POA: Diagnosis not present

## 2023-01-27 DIAGNOSIS — J439 Emphysema, unspecified: Secondary | ICD-10-CM | POA: Diagnosis not present

## 2023-01-27 DIAGNOSIS — F17210 Nicotine dependence, cigarettes, uncomplicated: Secondary | ICD-10-CM | POA: Diagnosis not present

## 2023-01-27 DIAGNOSIS — C189 Malignant neoplasm of colon, unspecified: Secondary | ICD-10-CM

## 2023-01-27 DIAGNOSIS — Z79899 Other long term (current) drug therapy: Secondary | ICD-10-CM | POA: Insufficient documentation

## 2023-01-27 DIAGNOSIS — Z5112 Encounter for antineoplastic immunotherapy: Secondary | ICD-10-CM | POA: Insufficient documentation

## 2023-01-27 DIAGNOSIS — Z79631 Long term (current) use of antimetabolite agent: Secondary | ICD-10-CM | POA: Insufficient documentation

## 2023-01-27 DIAGNOSIS — Z886 Allergy status to analgesic agent status: Secondary | ICD-10-CM | POA: Insufficient documentation

## 2023-01-27 DIAGNOSIS — Z515 Encounter for palliative care: Secondary | ICD-10-CM | POA: Diagnosis not present

## 2023-01-27 DIAGNOSIS — C787 Secondary malignant neoplasm of liver and intrahepatic bile duct: Secondary | ICD-10-CM | POA: Insufficient documentation

## 2023-01-27 DIAGNOSIS — K219 Gastro-esophageal reflux disease without esophagitis: Secondary | ICD-10-CM | POA: Insufficient documentation

## 2023-01-27 DIAGNOSIS — F1290 Cannabis use, unspecified, uncomplicated: Secondary | ICD-10-CM | POA: Insufficient documentation

## 2023-01-27 DIAGNOSIS — C180 Malignant neoplasm of cecum: Secondary | ICD-10-CM | POA: Diagnosis present

## 2023-01-27 DIAGNOSIS — Z79630 Long term (current) use of alkylating agent: Secondary | ICD-10-CM | POA: Insufficient documentation

## 2023-01-27 LAB — CBC WITH DIFFERENTIAL (CANCER CENTER ONLY)
Abs Immature Granulocytes: 0.01 10*3/uL (ref 0.00–0.07)
Basophils Absolute: 0 10*3/uL (ref 0.0–0.1)
Basophils Relative: 1 %
Eosinophils Absolute: 0.1 10*3/uL (ref 0.0–0.5)
Eosinophils Relative: 2 %
HCT: 32.1 % — ABNORMAL LOW (ref 39.0–52.0)
Hemoglobin: 10.4 g/dL — ABNORMAL LOW (ref 13.0–17.0)
Immature Granulocytes: 0 %
Lymphocytes Relative: 20 %
Lymphs Abs: 1 10*3/uL (ref 0.7–4.0)
MCH: 25.7 pg — ABNORMAL LOW (ref 26.0–34.0)
MCHC: 32.4 g/dL (ref 30.0–36.0)
MCV: 79.3 fL — ABNORMAL LOW (ref 80.0–100.0)
Monocytes Absolute: 0.4 10*3/uL (ref 0.1–1.0)
Monocytes Relative: 9 %
Neutro Abs: 3.3 10*3/uL (ref 1.7–7.7)
Neutrophils Relative %: 68 %
Platelet Count: 229 10*3/uL (ref 150–400)
RBC: 4.05 MIL/uL — ABNORMAL LOW (ref 4.22–5.81)
RDW: 15.5 % (ref 11.5–15.5)
WBC Count: 4.9 10*3/uL (ref 4.0–10.5)
nRBC: 0 % (ref 0.0–0.2)

## 2023-01-27 LAB — CMP (CANCER CENTER ONLY)
ALT: 6 U/L (ref 0–44)
AST: 15 U/L (ref 15–41)
Albumin: 3.5 g/dL (ref 3.5–5.0)
Alkaline Phosphatase: 80 U/L (ref 38–126)
Anion gap: 6 (ref 5–15)
BUN: 19 mg/dL (ref 6–20)
CO2: 25 mmol/L (ref 22–32)
Calcium: 8.3 mg/dL — ABNORMAL LOW (ref 8.9–10.3)
Chloride: 111 mmol/L (ref 98–111)
Creatinine: 0.9 mg/dL (ref 0.61–1.24)
GFR, Estimated: >60 mL/min (ref >60)
Glucose, Bld: 186 mg/dL — ABNORMAL HIGH (ref 70–99)
Potassium: 3.3 mmol/L — ABNORMAL LOW (ref 3.5–5.1)
Sodium: 142 mmol/L (ref 135–145)
Total Bilirubin: 0.3 mg/dL (ref <1.2)
Total Protein: 6.2 g/dL — ABNORMAL LOW (ref 6.5–8.1)

## 2023-01-27 MED ORDER — SODIUM CHLORIDE 0.9 % IV SOLN: Freq: Once | INTRAVENOUS | Status: AC

## 2023-01-27 MED ORDER — OXALIPLATIN CHEMO INJECTION 100 MG/20ML
85.0000 mg/m2 | Freq: Once | INTRAVENOUS | Status: AC
  Administered 2023-01-27: 150 mg via INTRAVENOUS
  Filled 2023-01-27: qty 20

## 2023-01-27 MED ORDER — SODIUM CHLORIDE 0.9 % IV SOLN
5.0000 mg/kg | Freq: Once | INTRAVENOUS | Status: AC
  Administered 2023-01-27: 300 mg via INTRAVENOUS
  Filled 2023-01-27: qty 12

## 2023-01-27 MED ORDER — LIDOCAINE-PRILOCAINE 2.5-2.5 % EX CREA: TOPICAL_CREAM | CUTANEOUS | 3 refills | Status: DC

## 2023-01-27 MED ORDER — LEUCOVORIN CALCIUM INJECTION 350 MG
400.0000 mg/m2 | Freq: Once | INTRAVENOUS | Status: AC
  Administered 2023-01-27: 696 mg via INTRAVENOUS
  Filled 2023-01-27: qty 34.8

## 2023-01-27 MED ORDER — SODIUM CHLORIDE 0.9 % IV SOLN
2400.0000 mg/m2 | INTRAVENOUS | Status: DC
  Administered 2023-01-27: 4200 mg via INTRAVENOUS
  Filled 2023-01-27: qty 84

## 2023-01-27 MED ORDER — PALONOSETRON HCL INJECTION 0.25 MG/5ML
0.2500 mg | Freq: Once | INTRAVENOUS | Status: AC
  Administered 2023-01-27: 0.25 mg via INTRAVENOUS
  Filled 2023-01-27: qty 5

## 2023-01-27 MED ORDER — HEPARIN SOD (PORK) LOCK FLUSH 100 UNIT/ML IV SOLN
500.0000 [IU] | Freq: Once | INTRAVENOUS | Status: DC | PRN
Start: 2023-01-27 — End: 2023-01-27

## 2023-01-27 MED ORDER — DEXTROSE 5 % IV SOLN: Freq: Once | INTRAVENOUS | Status: AC

## 2023-01-27 MED ORDER — SODIUM CHLORIDE 0.9% FLUSH: 10.0000 mL | INTRAVENOUS | Status: DC | PRN

## 2023-01-27 MED ORDER — DEXAMETHASONE SODIUM PHOSPHATE 10 MG/ML IJ SOLN
10.0000 mg | Freq: Once | INTRAMUSCULAR | Status: AC
  Administered 2023-01-27: 10 mg via INTRAVENOUS
  Filled 2023-01-27: qty 1

## 2023-01-27 NOTE — Patient Instructions (Signed)
[facility] - A DEPT OF [facility]  Discharge Instructions: Thank you for choosing [facility] to provide your oncology and hematology care.   If you have a lab appointment with the Cancer Center, please go directly to the Cancer Center and check in at the registration area.   Wear comfortable clothing and clothing appropriate for easy access to any Portacath or PICC line.   We strive to give you quality time with your provider. You may need to reschedule your appointment if you arrive late (15 or more minutes).  Arriving late affects you and other patients whose appointments are after yours.  Also, if you miss three or more appointments without notifying the office, you may be dismissed from the clinic at the provider's discretion.      For prescription refill requests, have your pharmacy contact our office and allow 72 hours for refills to be completed.    Today you received the following chemotherapy and/or immunotherapy agents: Bevacizumab (Mvasi), Oxaliplatin, Leucovorin, and Fluorouracil.      To help prevent nausea and vomiting after your treatment, we encourage you to take your nausea medication as directed.  BELOW ARE SYMPTOMS THAT SHOULD BE REPORTED IMMEDIATELY: *FEVER GREATER THAN 100.4 F (38 C) OR HIGHER *CHILLS OR SWEATING *NAUSEA AND VOMITING THAT IS NOT CONTROLLED WITH YOUR NAUSEA MEDICATION *UNUSUAL SHORTNESS OF BREATH *UNUSUAL BRUISING OR BLEEDING *URINARY PROBLEMS (pain or burning when urinating, or frequent urination) *BOWEL PROBLEMS (unusual diarrhea, constipation, pain near the anus) TENDERNESS IN MOUTH AND THROAT WITH OR WITHOUT PRESENCE OF ULCERS (sore throat, sores in mouth, or a toothache) UNUSUAL RASH, SWELLING OR PAIN  UNUSUAL VAGINAL DISCHARGE OR ITCHING   Items with * indicate a potential emergency and should be followed up as soon as possible or go to the Emergency Department if any problems should  occur.  Please show the CHEMOTHERAPY ALERT CARD or IMMUNOTHERAPY ALERT CARD at check-in to the Emergency Department and triage nurse.  Should you have questions after your visit or need to cancel or reschedule your appointment, please contact [facility] - A DEPT OF [facility]  Dept: [phone]  and follow the prompts.  Office hours are 8:00 a.m. to 4:30 p.m. Monday - Friday. Please note that voicemails left after 4:00 p.m. may not be returned until the following business day.  We are closed weekends and major holidays. You have access to a nurse at all times for urgent questions. Please call the main number to the clinic Dept: [phone] and follow the prompts.   For any non-urgent questions, you may also contact your provider using MyChart. We now offer e-Visits for anyone 55 and older to request care online for non-urgent symptoms. For details visit [email or web address].   Also download the MyChart app! Go to the app store, search "MyChart", open the app, select [facility], and log in with your MyChart username and password.  The chemotherapy medication bag should finish at 46 hours, 96 hours, or 7 days. For example, if your pump is scheduled for 46 hours and it was put on at 4:00 p.m., it should finish at 2:00 p.m. the day it is scheduled to come off regardless of your appointment time.     Estimated time to finish at:   If the display on your pump reads "Low Volume" and it is beeping, take the batteries out of the pump and come to the cancer center for it to  be taken off.   If the pump alarms go off prior to the pump reading "Low Volume" then call [phone] and someone can assist you.  If the plunger comes out and the chemotherapy medication is leaking out, please use your home chemo spill kit to clean up the spill. Do NOT use paper towels or other household products.  If you have problems or questions regarding your pump, please call either  [phone] (24 hours a day) or the cancer center Monday-Friday 8:00 a.m.- 4:30 p.m. at the clinic number and we will assist you. If you are unable to get assistance, then go to the nearest Emergency Department and ask the staff to contact the IV team for assistance.

## 2023-01-27 NOTE — Progress Notes (Signed)
[facility]   Telephone:(336) [phone] Fax:(336) [phone]   Clinic Follow up Note   Patient Care Team: Pcp, No as PCP - General Patient, No Pcp Per (General Practice) [name], MD as Consulting Physician (Hematology and Oncology) Pickenpack-Cousar, [name], NP as Nurse Practitioner [facility] and Palliative Medicine)  Date of Service:  01/27/2023  CHIEF COMPLAINT: f/u of metastatic colon cancer  CURRENT THERAPY:  First-line chemotherapy FOLFOX and bevacizumab  Oncology History   Metastatic colon cancer to liver (HCC) -stage IV with liver and lung metastasis, MMR normal, KRAS G12A mutation(+), TMB 10.5  -Patient presented with worsening abdominal pain for the past 3 to 4 months.  His workup in the hospital revealed metastatic colon cancer to liver and lung.   -Although his biopsy of the cecal mass was negative for malignant cells, his liver biopsy confirmed adenocarcinoma and immunostain are consistent with colorectal primary.  Based on the colonoscopy findings, and severe iron deficiency, this is consistent with metastatic colon cancer -I recommend systemic therapy with FOLFOX and beva. -The goal of chemotherapy is palliative, to prolong his life and improve cancer related symptoms. -I also reviewed the NGS testing results with patient, which showed MSI stable disease, K-ras mutation positive.  He is not a candidate for EGFR inhibitor  -He is not a candidate for immunotherapy as first-line, but he does have high tumor mutation burden, and is a candidate for immunotherapy in the later line setting.  -He started FOLFOX on 11/18/2022, tolerated first cycle well. Beva was added on cycle 2 He is overall doing well.     Assessment and Plan    Metastatic Colon Cancer Tolerating chemotherapy well with minimal side effects. No new symptoms or concerns. Initial symptoms of bleeding and abdominal pain have resolved. -Schedule CT scan in the next 1-2 weeks to assess response to  treatment. -Continue current chemotherapy regimen. -Plan for possible transition to maintenance therapy with one chemo drug and bevacizumab in 2-3 months, pending CT scan results. -Next appointments scheduled for November 9, December 3, December 17, and December 31.  Medication Management Patient is taking all prescribed medications, including potassium. No refills needed at this time except for lidocaine cream. -[facility] [facility] -Check potassium levels today.     Plan -Lab reviewed, adequate for treatment, will proceed cycle 5 chemotherapy -Patient will call radiology scheduling to schedule his CT scan in the next few weeks -Follow-up in 2 weeks before next cycle of chemo    SUMMARY OF ONCOLOGIC HISTORY: Oncology History Overview Note   Cancer Staging  Metastatic colon cancer to liver [facility]) Staging form: Colon and Rectum, AJCC 8th Edition - Clinical stage from 10/24/2022: Stage IVB (cTX, cN0, pM1b) - Signed by [name], MD on 10/31/2022 Total positive nodes: 0     Metastatic colon cancer to liver (HCC)  10/17/2022 Imaging   CT CHEST W CONTRAST   IMPRESSION: 3 cm mass in the posterior right upper lobe with some dystrophic calcification. In addition there is an abnormal precarinal lymph node. Malignancy is possible. Recommend further workup such as PET-CT scan when clinically appropriate.   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 The previous larger bulla in the right lung apex is not seen on the current study.   Coronary artery calcifications. Please correlate for other coronary risk factors.   Aortic Atherosclerosis (ICD10-I70.0) and Emphysema (ICD10-J43.9).     10/24/2022 Cancer Staging   Staging form: Colon and Rectum, AJCC  8th Edition - Clinical stage from 10/24/2022: Stage IVB (cTX, cN0, pM1b) - Signed by [name], MD on 10/31/2022 Total positive nodes: 0   10/29/2022 Imaging   C ABDOMEN PELVIS W  CONTRAST   IMPRESSION: 1. 3.3 x 3.2 x 2.6 cm rounded masslike wall abnormality in the mid transverse colon just left of the midline, suspected to be a primary tumor. This fills most of luminal volume but there is no evidence of an upstream mechanical obstruction. 2. Four metastatic liver masses are again noted, with the largest lesion in segment 8 measuring 5.1 cm, previously 4.3 cm. 3. Mesenteric edema which could be congestive, due to malnutrition or hepatic dysfunction. Some of this is seen adjacent to the pancreas. It is possible the patient could have pancreatitis but no peripancreatic fluid or ductal dilatation is seen. Correlate with serum lipase. 4. Diverticulosis and moderate fecal stasis. 5. Aortic atherosclerosis.   10/31/2022 Initial Diagnosis   Metastatic colon cancer to liver [facility])   11/06/2022 Miscellaneous      11/18/2022 -  Chemotherapy   Patient is on Treatment Plan : COLORECTAL FOLFOX + Bevacizumab q14d        Discussed the use of AI scribe software for clinical note transcription with the patient, who gave verbal consent to proceed.  History of Present Illness   The patient, a 57 year old with metastatic colon cancer, presents for a routine follow-up. He reports tolerating the chemotherapy well, with no significant side effects except for cold sensitivity when touching cold objects. He denies any tingling or numbness. His appetite is good and his weight has remained stable. His energy level is satisfactory. He has been compliant with all prescribed medications, including a one-week course of potassium. He requests a refill of his lidocaine cream. He has not experienced any bleeding. Previously reported symptoms, including abdominal pain and bleeding at the time of diagnosis, have resolved.         All other systems were reviewed with the patient and are negative.  MEDICAL HISTORY:  Past Medical History:  Diagnosis Date   Anemia Dx 2014   Gallstones     Hemothorax    Ulcer     SURGICAL HISTORY: Past Surgical History:  Procedure Laterality Date   BIOPSY  10/18/2022   Procedure: BIOPSY;  Surgeon: Kathi Der, MD;  Location: [facility];  Service: Gastroenterology;;   BIOPSY  10/24/2022   Procedure: BIOPSY;  Surgeon: Kerin Salen, MD;  Location: [facility];  Service: Gastroenterology;;   COLONOSCOPY WITH PROPOFOL N/A 10/18/2022   Procedure: COLONOSCOPY WITH PROPOFOL;  Surgeon: Kathi Der, MD;  Location: [facility];  Service: Gastroenterology;  Laterality: N/A;   COLONOSCOPY WITH PROPOFOL N/A 10/24/2022   Procedure: COLONOSCOPY WITH PROPOFOL;  Surgeon: Kerin Salen, MD;  Location: [facility];  Service: Gastroenterology;  Laterality: N/A;   ESOPHAGOGASTRODUODENOSCOPY (EGD) WITH PROPOFOL N/A 10/18/2022   Procedure: ESOPHAGOGASTRODUODENOSCOPY (EGD) WITH PROPOFOL;  Surgeon: Kathi Der, MD;  Location: [facility];  Service: Gastroenterology;  Laterality: N/A;   IR IMAGING GUIDED PORT INSERTION  11/12/2022   LAPAROTOMY N/A 12/09/2017   Procedure: EXPLORATORY LAPAROTOMY, REPAIR PERFORATED PYLEORIC ULCER;  Surgeon: Almond Lint, MD;  Location: [facility];  Service: General;  Laterality: N/A;   POLYPECTOMY  10/24/2022   Procedure: POLYPECTOMY;  Surgeon: Kerin Salen, MD;  Location: [facility];  Service: Gastroenterology;;   SUBMUCOSAL TATTOO INJECTION  10/18/2022   Procedure: SUBMUCOSAL TATTOO INJECTION;  Surgeon: Kathi Der, MD;  Location: [facility];  Service: Gastroenterology;;    I have  reviewed the social history and family history with the patient and they are unchanged from previous note.  ALLERGIES:  is allergic to aspirin.  MEDICATIONS:  Current Outpatient Medications  Medication Sig Dispense Refill   acetaminophen (TYLENOL) 500 MG tablet Take 2 tablets (1,000 mg total) by mouth every 6 (six) hours as needed for mild pain (pain score 1-3) or headache. 30 tablet 2   BENADRYL ALLERGY 25 MG tablet Take 1-2 tablets  (25-50 mg total) by mouth every 6 (six) hours as needed for sleep. 30 tablet 2   cyanocobalamin 1000 MCG tablet Take 1 tablet (1,000 mcg total) by mouth daily. 30 tablet 2   lidocaine-prilocaine (EMLA) cream Apply to affected area once 30 g 3   ondansetron (ZOFRAN) 8 MG tablet Take 1 tablet (8 mg total) by mouth every 8 (eight) hours as needed for nausea or vomiting. Start on the third day after chemotherapy. 30 tablet 1   oxyCODONE (OXY IR/ROXICODONE) 5 MG immediate release tablet Take 1 tablet (5 mg total) by mouth every 6 (six) hours as needed for severe pain (pain score 7-10). 45 tablet 0   pantoprazole (PROTONIX) 40 MG tablet Take 1 tablet (40 mg total) by mouth daily. 30 tablet 3   potassium chloride SA (KLOR-CON M) 20 MEQ tablet Take 1 tablet (20 mEq total) by mouth daily. 7 tablet 0   prochlorperazine (COMPAZINE) 10 MG tablet Take 1 tablet (10 mg total) by mouth every 6 (six) hours as needed for nausea or vomiting. 30 tablet 1   No current facility-administered medications for this visit.    PHYSICAL EXAMINATION: ECOG PERFORMANCE STATUS: 1 - Symptomatic but completely ambulatory  Vitals:   01/27/23 1043  BP: 138/65  Pulse: 68  Resp: 16  Temp: 98.2 F (36.8 C)  SpO2: 100%   Wt Readings from Last 3 Encounters:  01/27/23 142 lb 1.6 oz (64.5 kg)  01/15/23 141 lb (64 kg)  01/13/23 142 lb (64.4 kg)     GENERAL:alert, no distress and comfortable SKIN: skin color, texture, turgor are normal, no rashes or significant lesions EYES: normal, Conjunctiva are pink and non-injected, sclera clear NECK: supple, thyroid normal size, non-tender, without nodularity LYMPH:  no palpable lymphadenopathy in the cervical, axillary  LUNGS: clear to auscultation and percussion with normal breathing effort HEART: regular rate & rhythm and no murmurs and no lower extremity edema ABDOMEN:abdomen soft, non-tender and normal bowel sounds Musculoskeletal:no cyanosis of digits and no clubbing  NEURO:  alert & oriented x 3 with fluent speech, no focal motor/sensory deficits  LABORATORY DATA:  I have reviewed the data as listed    Latest Ref Rng & Units 01/27/2023   10:27 AM 01/13/2023    8:58 AM 12/30/2022    8:58 AM  CBC  WBC 4.0 - 10.5 K/uL 4.9  6.3  6.6   Hemoglobin 13.0 - 17.0 g/dL 73.7  10.6  26.9   Hematocrit 39.0 - 52.0 % 32.1  34.6  33.1   Platelets 150 - 400 K/uL 229  187  199         Latest Ref Rng & Units 01/27/2023   10:27 AM 01/13/2023    8:58 AM 12/30/2022    8:58 AM  CMP  Glucose 70 - 99 mg/dL 485  462  87   BUN 6 - 20 mg/dL 19  17  22    Creatinine 0.61 - 1.24 mg/dL 7.03  5.00  9.38   Sodium 135 - 145 mmol/L 142  137  139   Potassium 3.5 - 5.1 mmol/L 3.3  3.3  3.9   Chloride 98 - 111 mmol/L 111  107  109   CO2 22 - 32 mmol/L 25  27  26    Calcium 8.9 - 10.3 mg/dL 8.3  8.5  8.6   Total Protein 6.5 - 8.1 g/dL 6.2  6.5  6.3   Total Bilirubin <1.2 mg/dL 0.3  0.3  0.3   Alkaline Phos 38 - 126 U/L 80  83  79   AST 15 - 41 U/L 15  15  13    ALT 0 - 44 U/L 6  7  6        RADIOGRAPHIC STUDIES: I have personally reviewed the radiological images as listed and agreed with the findings in the report. No results found.    No orders of the defined types were placed in this encounter.  All questions were answered. The patient knows to call the clinic with any problems, questions or concerns. No barriers to learning was detected. The total time spent in the appointment was 25 minutes.     [name], MD 01/27/2023

## 2023-01-29 ENCOUNTER — Other Ambulatory Visit: Payer: Self-pay

## 2023-01-29 ENCOUNTER — Inpatient Hospital Stay: Payer: No Typology Code available for payment source

## 2023-01-29 NOTE — Progress Notes (Signed)
Patient arrived today for his chemotherapy pump removal. upon removing the treatment, I noticed that the secondary line on his y site did not have a cap on it and it was open! the main line to his port had two caps on and the chemo line was hooked to the wrong lead.  Doctor was notified and aware.

## 2023-01-30 ENCOUNTER — Other Ambulatory Visit: Payer: Self-pay

## 2023-01-30 ENCOUNTER — Telehealth: Payer: Self-pay

## 2023-01-30 MED ORDER — POTASSIUM CHLORIDE CRYS ER 20 MEQ PO TBCR: 20.0000 meq | EXTENDED_RELEASE_TABLET | Freq: Every day | ORAL | 0 refills | Status: DC

## 2023-01-30 NOTE — Telephone Encounter (Signed)
Informed pt that his K+ is low and Dr. Mosetta Putt prescribed for the pt to take Klor-Con for 1 month.  Stated pt's prescription [facility] ([facility]).

## 2023-02-05 ENCOUNTER — Emergency Department (HOSPITAL_COMMUNITY)
Admission: EM | Admit: 2023-02-05 | Discharge: 2023-02-05 | Disposition: A | Discharge status: Home / Self Care | Payer: No Typology Code available for payment source

## 2023-02-05 ENCOUNTER — Encounter (HOSPITAL_COMMUNITY): Payer: Self-pay | Admitting: Emergency Medicine

## 2023-02-05 ENCOUNTER — Other Ambulatory Visit: Payer: Self-pay

## 2023-02-05 ENCOUNTER — Emergency Department (HOSPITAL_COMMUNITY): Payer: No Typology Code available for payment source

## 2023-02-05 DIAGNOSIS — M79671 Pain in right foot: Secondary | ICD-10-CM | POA: Diagnosis present

## 2023-02-05 DIAGNOSIS — Y92410 Unspecified street and highway as the place of occurrence of the external cause: Secondary | ICD-10-CM | POA: Diagnosis not present

## 2023-02-05 DIAGNOSIS — S92331A Displaced fracture of third metatarsal bone, right foot, initial encounter for closed fracture: Secondary | ICD-10-CM | POA: Diagnosis not present

## 2023-02-05 DIAGNOSIS — S92341A Displaced fracture of fourth metatarsal bone, right foot, initial encounter for closed fracture: Secondary | ICD-10-CM | POA: Insufficient documentation

## 2023-02-05 DIAGNOSIS — S92321A Displaced fracture of second metatarsal bone, right foot, initial encounter for closed fracture: Secondary | ICD-10-CM | POA: Diagnosis not present

## 2023-02-05 MED ORDER — OXYCODONE-ACETAMINOPHEN 5-325 MG PO TABS
1.0000 | ORAL_TABLET | Freq: Once | ORAL | Status: AC
  Administered 2023-02-05: 1 via ORAL
  Filled 2023-02-05: qty 1

## 2023-02-05 MED ORDER — OXYCODONE-ACETAMINOPHEN 5-325 MG PO TABS: 1.0000 | ORAL_TABLET | Freq: Four times a day (QID) | ORAL | 0 refills | Status: AC | PRN
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
## 2023-02-05 NOTE — Progress Notes (Signed)
Orthopedic Tech Progress Note Patient Details:  Marvin Thomas Dec 23, 1965 [id number]  Ortho Devices Type of Ortho Device: Crutches, CAM walker Ortho Device/Splint Location: right cam boot applied. crutches sized and instructed on use Ortho Device/Splint Interventions: Ordered, Application, Adjustment   Post Interventions Patient Tolerated: Well, Ambulated well Instructions Provided: Care of device, Adjustment of device  Kizzie Fantasia 02/05/2023, 8:58 PM

## 2023-02-05 NOTE — Discharge Instructions (Signed)
Please use crutches and do not bear weight until evaluated by Ortho.  Remain in the brace we have provided you.  You may take it off to shower, but again do not bear weight.  He may take over-the-counter Tylenol alternating with Motrin as directed on the packaging for pain.  We have prescribed you some pain medications for breakthrough pain.  Please call and schedule appointment to be seen by Ortho within the next week.
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
## 2023-02-06 ENCOUNTER — Ambulatory Visit (HOSPITAL_COMMUNITY)
Admission: RE | Admit: 2023-02-06 | Discharge: 2023-02-06 | Disposition: A | Discharge status: Home / Self Care | Payer: No Typology Code available for payment source | Source: Ambulatory Visit | Attending: Nurse Practitioner | Admitting: Nurse Practitioner

## 2023-02-06 MED ORDER — IOHEXOL 9 MG/ML PO SOLN: 1000.0000 mL | ORAL | Status: AC

## 2023-02-06 MED ORDER — IOHEXOL 9 MG/ML PO SOLN: 1000.0000 mL | ORAL | Status: DC
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
## 2023-02-08 NOTE — Assessment & Plan Note (Deleted)
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  His workup in the hospital revealed metastatic colon cancer to liver and lung.   -Although his biopsy of the cecal mass was negative for malignant cells, his liver biopsy confirmed adenocarcinoma and immunostain are consistent with colorectal primary.  Based on the colonoscopy findings, and severe iron deficiency, this is consistent with metastatic colon cancer -systemic chemotherapy with FOLFOX and beva was recommended. -The goal of chemotherapy is palliative, to prolong his life and improve cancer related symptoms. -NGS testing results werre reviewed with patient, which showed MSI stable disease, K-ras mutation positive.  He is not a candidate for EGFR inhibitor  -He is not a candidate for immunotherapy as first-line, but he does have high tumor mutation burden, and is a candidate for immunotherapy in the later line setting.  -He started FOLFOX on 11/18/2022, tolerated first cycle well. Beva was added on cycle 2 He is overall doing well.  02/09/2023 - presents for Cycle 6 day 1

## 2023-02-09 ENCOUNTER — Other Ambulatory Visit: Payer: Self-pay | Admitting: Hematology

## 2023-02-09 ENCOUNTER — Telehealth: Payer: Self-pay

## 2023-02-09 DIAGNOSIS — C189 Malignant neoplasm of colon, unspecified: Secondary | ICD-10-CM
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
## 2023-02-10 ENCOUNTER — Inpatient Hospital Stay: Payer: No Typology Code available for payment source | Admitting: Nurse Practitioner

## 2023-02-10 ENCOUNTER — Inpatient Hospital Stay: Payer: No Typology Code available for payment source

## 2023-02-10 DIAGNOSIS — C787 Secondary malignant neoplasm of liver and intrahepatic bile duct: Secondary | ICD-10-CM

## 2023-02-20 NOTE — Progress Notes (Unsigned)
Palliative Medicine [facility]  [phone] [phone]   Name: Marvin Thomas Date: 02/20/2023 MRN: [id number]  DOB: 12/02/1965  Patient Care Team: [name], MD as PCP - General (Hematology) Patient, No Pcp Per (General Practice) [name], MD as Consulting Physician (Hematology and Oncology) Pickenpack-Cousar, [name], NP as Nurse Practitioner [facility] and Palliative Medicine)    INTERVAL HISTORY: Marvin Thomas is a 57 y.o. male with oncologic medical history including colon cancer (10/2022) with metastatic disease to the liver and the lung, as well as peptic ulcer disease, COPD, GERD, and anemia. Palliative ask to see for symptom and pain management and goals of care.   SOCIAL HISTORY:     reports that he has been smoking cigarettes. He has a 46.5 pack-year smoking history. He uses smokeless tobacco. He reports current alcohol use. He reports current drug use. Drug: Marijuana.  ADVANCE DIRECTIVES:  None on file  CODE STATUS: Full code  PAST MEDICAL HISTORY: Past Medical History:  Diagnosis Date   Anemia Dx 2014   Gallstones    Hemothorax    Ulcer     ALLERGIES:  is allergic to aspirin.  MEDICATIONS:  Current Outpatient Medications  Medication Sig Dispense Refill   acetaminophen (TYLENOL) 500 MG tablet Take 2 tablets (1,000 mg total) by mouth every 6 (six) hours as needed for mild pain (pain score 1-3) or headache. 30 tablet 2   BENADRYL ALLERGY 25 MG tablet Take 1-2 tablets (25-50 mg total) by mouth every 6 (six) hours as needed for sleep. 30 tablet 2   cyanocobalamin 1000 MCG tablet Take 1 tablet (1,000 mcg total) by mouth daily. 30 tablet 2   lidocaine-prilocaine (EMLA) cream Apply to affected area once 30 g 3   ondansetron (ZOFRAN) 8 MG tablet Take 1 tablet (8 mg total) by mouth every 8 (eight) hours as needed for nausea or vomiting. Start on the third day after chemotherapy. 30 tablet 1   pantoprazole (PROTONIX) 40  MG tablet Take 1 tablet (40 mg total) by mouth daily. 30 tablet 3   potassium chloride SA (KLOR-CON M) 20 MEQ tablet Take 1 tablet (20 mEq total) by mouth daily. 30 tablet 0   prochlorperazine (COMPAZINE) 10 MG tablet Take 1 tablet (10 mg total) by mouth every 6 (six) hours as needed for nausea or vomiting. 30 tablet 1   No current facility-administered medications for this visit.    VITAL SIGNS: There were no vitals taken for this visit. There were no vitals filed for this visit.  Estimated body mass index is 27.62 kg/m as calculated from the following:   Height as of 02/05/23: 5\' 9"  (1.753 m).   Weight as of 02/05/23: 187 lb (84.8 kg).   PERFORMANCE STATUS (ECOG) : 1 - Symptomatic but completely ambulatory   Physical Exam General: NAD Cardiovascular: regular rate and rhythm Pulmonary: normal breathing pattern  Extremities: no edema, no joint deformities Skin: no rashes Neurological: AAO x4  Discussed the use of AI scribe software for clinical note transcription with the patient, who gave verbal consent to proceed.   IMPRESSION:  Marvin Thomas presents to clinic for follow-up. Reports overall satisfactory pain management, with occasional episodes of discomfort. He denies any associated constipation or diarrhea. Sleep quality is reported as good, and the patient denies any nausea or vomiting. His energy level is described as fluctuating, with some days better than others.  The patient denies any changes in his health status and is  due for a CT scan. Pain is not a daily occurrence and does not seem to be triggered by any specific activity or movement. Pain medication is taken approximately every other day, indicating that pain is not a constant issue.  The patient's appetite is reported as good, with a stable weight of 142 pounds. He denies any changes in appetite around treatment times, and also denies any increased fatigue post-treatment. The patient maintains an active lifestyle,  including yard work, and does not spend much time sedentary.  The patient is on Protonix for indigestion and reports adherence to this medication regimen. He is also undergoing infusions, which he tolerates well.  We will continue to closely monitor and support as needed.  Goals of Care  11/20/22- We discussed his current illness and what it means in the larger context of his on-going co-morbidities. Natural disease trajectory and expectations were discussed.   Brin and his sister are realistic in their understanding. They are clear in expressed goals to continue to treat the treatable allowing him every opportunity to continue thriving.   We discussed Her current illness and what it means in the larger context of Her on-going co-morbidities. Natural disease trajectory and expectations were discussed.  I discussed the importance of continued conversation with family and their medical providers regarding overall plan of care and treatment options, ensuring decisions are within the context of the patients values and GOCs.  PLAN:  Pain Intermittent pain, not daily. No specific triggers identified. Pain managed with as-needed medication. -Continue current pain management regimen. Does not require daily use.  -If need for pain medication increases, patient to contact office for refills.  Gastroesophageal Reflux Disease No reported symptoms of indigestion. Patient is compliant with daily Protonix. -Continue Protonix as prescribed.  Cancer Treatment Patient tolerating treatments well with no reported changes in appetite, nausea, vomiting, or fatigue. Patient remains active. -Continue current treatment regimen. -Schedule for next infusion in the blue room. -Next appointment on December 3rd, 2024.  Patient expressed understanding and was in agreement with this plan. He also understands that He can call the clinic at any time with any questions, concerns, or complaints.   Any controlled  substances utilized were prescribed in the context of palliative care. PDMP has been reviewed.    Visit consisted of counseling and education dealing with the complex and emotionally intense issues of symptom management and palliative care in the setting of serious and potentially life-threatening illness.  [name], AGPCNP-BC  Palliative Medicine Team/[facility]  *Please note that this is a verbal dictation therefore any spelling or grammatical errors are due to the "Dragon [employer] One" system interpretation.
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
## 2023-02-23 ENCOUNTER — Other Ambulatory Visit: Payer: Self-pay

## 2023-02-23 DIAGNOSIS — C787 Secondary malignant neoplasm of liver and intrahepatic bile duct: Secondary | ICD-10-CM

## 2023-02-23 DIAGNOSIS — Z515 Encounter for palliative care: Secondary | ICD-10-CM

## 2023-02-24 ENCOUNTER — Inpatient Hospital Stay: Payer: No Typology Code available for payment source | Admitting: Nurse Practitioner

## 2023-02-24 ENCOUNTER — Telehealth: Payer: Self-pay

## 2023-02-24 ENCOUNTER — Inpatient Hospital Stay: Payer: No Typology Code available for payment source

## 2023-02-24 DIAGNOSIS — C189 Malignant neoplasm of colon, unspecified: Secondary | ICD-10-CM
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
## 2023-02-26 ENCOUNTER — Inpatient Hospital Stay: Payer: No Typology Code available for payment source

## 2023-02-27 NOTE — Progress Notes (Unsigned)
Palliative Medicine [facility]  [phone] [phone]   Name: Marvin Thomas Date: 02/27/2023 MRN: [id number]  DOB: [birth year]  Patient Care Team: [name], MD as PCP - General (Hematology) Patient, No Pcp Per (General Practice) [name], MD as Consulting Physician (Hematology and Oncology) Pickenpack-Cousar, [name], NP as Nurse Practitioner [facility] and Palliative Medicine)    INTERVAL HISTORY: Marvin Thomas is a 57 y.o. male with oncologic medical history including colon cancer (10/2022) with metastatic disease to the liver and the lung, as well as peptic ulcer disease, COPD, GERD, and anemia. Palliative ask to see for symptom and pain management and goals of care.   SOCIAL HISTORY:     reports that he has been smoking cigarettes. He has a 46.5 pack-year smoking history. He uses smokeless tobacco. He reports current alcohol use. He reports current drug use. Drug: Marijuana.  ADVANCE DIRECTIVES:  None on file  CODE STATUS: Full code  PAST MEDICAL HISTORY: Past Medical History:  Diagnosis Date  . Anemia Dx 2014  . Gallstones   . Hemothorax   . Ulcer     ALLERGIES:  is allergic to aspirin.  MEDICATIONS:  Current Outpatient Medications  Medication Sig Dispense Refill  . acetaminophen (TYLENOL) 500 MG tablet Take 2 tablets (1,000 mg total) by mouth every 6 (six) hours as needed for mild pain (pain score 1-3) or headache. 30 tablet 2  . BENADRYL ALLERGY 25 MG tablet Take 1-2 tablets (25-50 mg total) by mouth every 6 (six) hours as needed for sleep. 30 tablet 2  . cyanocobalamin 1000 MCG tablet Take 1 tablet (1,000 mcg total) by mouth daily. 30 tablet 2  . lidocaine-prilocaine (EMLA) cream Apply to affected area once 30 g 3  . ondansetron (ZOFRAN) 8 MG tablet Take 1 tablet (8 mg total) by mouth every 8 (eight) hours as needed for nausea or vomiting. Start on the third day after chemotherapy. 30 tablet 1  . pantoprazole  (PROTONIX) 40 MG tablet Take 1 tablet (40 mg total) by mouth daily. 30 tablet 3  . potassium chloride SA (KLOR-CON M) 20 MEQ tablet Take 1 tablet (20 mEq total) by mouth daily. 30 tablet 0  . prochlorperazine (COMPAZINE) 10 MG tablet Take 1 tablet (10 mg total) by mouth every 6 (six) hours as needed for nausea or vomiting. 30 tablet 1   No current facility-administered medications for this visit.    VITAL SIGNS: There were no vitals taken for this visit. There were no vitals filed for this visit.  Estimated body mass index is 27.62 kg/m as calculated from the following:   Height as of 02/05/23: 5\' 9"  (1.753 m).   Weight as of 02/05/23: 187 lb (84.8 kg).   PERFORMANCE STATUS (ECOG) : 1 - Symptomatic but completely ambulatory   Physical Exam General: NAD Cardiovascular: regular rate and rhythm Pulmonary: normal breathing pattern  Extremities: no edema, no joint deformities Skin: no rashes Neurological: AAO x4  Discussed the use of AI scribe software for clinical note transcription with the patient, who gave verbal consent to proceed.   IMPRESSION:  Marvin Thomas presents to clinic for follow-up. Reports overall satisfactory pain management, with occasional episodes of discomfort. He denies any associated constipation or diarrhea. Sleep quality is reported as good, and the patient denies any nausea or vomiting. His energy level is described as fluctuating, with some days better than others.  The patient denies any changes in his health status and is  due for a CT scan. Pain is not a daily occurrence and does not seem to be triggered by any specific activity or movement. Pain medication is taken approximately every other day, indicating that pain is not a constant issue.  The patient's appetite is reported as good, with a stable weight of 142 pounds. He denies any changes in appetite around treatment times, and also denies any increased fatigue post-treatment. The patient maintains an  active lifestyle, including yard work, and does not spend much time sedentary.  The patient is on Protonix for indigestion and reports adherence to this medication regimen. He is also undergoing infusions, which he tolerates well.  We will continue to closely monitor and support as needed.  Goals of Care  11/20/22- We discussed his current illness and what it means in the larger context of his on-going co-morbidities. Natural disease trajectory and expectations were discussed.   Marvin Thomas and his sister are realistic in their understanding. They are clear in expressed goals to continue to treat the treatable allowing him every opportunity to continue thriving.   We discussed Her current illness and what it means in the larger context of Her on-going co-morbidities. Natural disease trajectory and expectations were discussed.  I discussed the importance of continued conversation with family and their medical providers regarding overall plan of care and treatment options, ensuring decisions are within the context of the patients values and GOCs.  PLAN:  Pain Intermittent pain, not daily. No specific triggers identified. Pain managed with as-needed medication. -Continue current pain management regimen. Does not require daily use.  -If need for pain medication increases, patient to contact office for refills.  Gastroesophageal Reflux Disease No reported symptoms of indigestion. Patient is compliant with daily Protonix. -Continue Protonix as prescribed.  Cancer Treatment Patient tolerating treatments well with no reported changes in appetite, nausea, vomiting, or fatigue. Patient remains active. -Continue current treatment regimen. -Schedule for next infusion in the blue room. -Next appointment on December 3rd, 2024.  Patient expressed understanding and was in agreement with this plan. He also understands that He can call the clinic at any time with any questions, concerns, or complaints.    Any controlled substances utilized were prescribed in the context of palliative care. PDMP has been reviewed.    Visit consisted of counseling and education dealing with the complex and emotionally intense issues of symptom management and palliative care in the setting of serious and potentially life-threatening illness.  [name], AGPCNP-BC  Palliative Medicine Team/[facility]  *Please note that this is a verbal dictation therefore any spelling or grammatical errors are due to the "Dragon [employer] One" system interpretation.
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
## 2023-03-02 ENCOUNTER — Inpatient Hospital Stay: Payer: No Typology Code available for payment source | Admitting: Hematology

## 2023-03-02 ENCOUNTER — Inpatient Hospital Stay: Payer: No Typology Code available for payment source

## 2023-03-02 ENCOUNTER — Inpatient Hospital Stay: Payer: Medicaid Other | Attending: Physician Assistant | Admitting: Nurse Practitioner

## 2023-03-02 ENCOUNTER — Ambulatory Visit: Payer: Medicaid Other | Admitting: Hematology

## 2023-03-02 DIAGNOSIS — C189 Malignant neoplasm of colon, unspecified: Secondary | ICD-10-CM

## 2023-03-04 ENCOUNTER — Inpatient Hospital Stay: Payer: No Typology Code available for payment source

## 2023-03-11 ENCOUNTER — Telehealth: Payer: Self-pay | Admitting: Hematology and Oncology
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
## 2023-03-16 ENCOUNTER — Inpatient Hospital Stay: Payer: No Typology Code available for payment source

## 2023-03-16 ENCOUNTER — Inpatient Hospital Stay: Payer: No Typology Code available for payment source | Admitting: Nurse Practitioner

## 2023-03-16 ENCOUNTER — Telehealth: Payer: Self-pay

## 2023-03-16 DIAGNOSIS — C189 Malignant neoplasm of colon, unspecified: Secondary | ICD-10-CM

## 2023-03-16 NOTE — Telephone Encounter (Signed)
Called patient due to not showing up for his 9am app with Vincent Gros NP. There was no answer and the VM was full unable to leave a message. Made infusion and NP aware. Will no show patient for appointments.

## 2023-03-17 ENCOUNTER — Inpatient Hospital Stay: Payer: No Typology Code available for payment source

## 2023-03-19 ENCOUNTER — Inpatient Hospital Stay: Payer: No Typology Code available for payment source

## 2023-03-20 ENCOUNTER — Telehealth: Payer: Self-pay | Admitting: Hematology

## 2023-03-31 NOTE — Progress Notes (Deleted)
 Palliative Medicine [facility]  [phone] [phone]) [phone]   Name: Marvin Thomas Date: 03/31/2023 MRN: [id number]  DOB: [birth year]  Patient Care Team: Lanny Callander, MD as PCP - General (Hematology) Patient, No Pcp Per (General Practice) Lanny Callander, MD as Consulting Physician (Hematology and Oncology) Pickenpack-Cousar, Fannie SAILOR, NP as Nurse Practitioner [facility] and Palliative Medicine)    INTERVAL HISTORY: Marvin Thomas is a 58 y.o. male with oncologic medical history including colon cancer (10/2022) with metastatic disease to the liver and the lung, as well as peptic ulcer disease, COPD, GERD, and anemia. Palliative ask to see for symptom and pain management and goals of care.   SOCIAL HISTORY:     reports that he has been smoking cigarettes. He has a 46.5 pack-year smoking history. He uses smokeless tobacco. He reports current alcohol use. He reports current drug use. Drug: Marijuana.  ADVANCE DIRECTIVES:  None on file  CODE STATUS: Full code  PAST MEDICAL HISTORY: Past Medical History:  Diagnosis Date  . Anemia Dx 2014  . Gallstones   . Hemothorax   . Ulcer     ALLERGIES:  is allergic to aspirin.  MEDICATIONS:  Current Outpatient Medications  Medication Sig Dispense Refill  . acetaminophen  (TYLENOL ) 500 MG tablet Take 2 tablets (1,000 mg total) by mouth every 6 (six) hours as needed for mild pain (pain score 1-3) or headache. 30 tablet 2  . BENADRYL  ALLERGY  25 MG tablet Take 1-2 tablets (25-50 mg total) by mouth every 6 (six) hours as needed for sleep. 30 tablet 2  . cyanocobalamin  1000 MCG tablet Take 1 tablet (1,000 mcg total) by mouth daily. 30 tablet 2  . lidocaine -prilocaine  (EMLA ) cream Apply to affected area once 30 g 3  . ondansetron  (ZOFRAN ) 8 MG tablet Take 1 tablet (8 mg total) by mouth every 8 (eight) hours as needed for nausea or vomiting. Start on the third day after chemotherapy. 30 tablet 1  . pantoprazole   (PROTONIX ) 40 MG tablet Take 1 tablet (40 mg total) by mouth daily. 30 tablet 3  . potassium chloride  SA (KLOR-CON  M) 20 MEQ tablet Take 1 tablet (20 mEq total) by mouth daily. 30 tablet 0  . prochlorperazine  (COMPAZINE ) 10 MG tablet Take 1 tablet (10 mg total) by mouth every 6 (six) hours as needed for nausea or vomiting. 30 tablet 1   No current facility-administered medications for this visit.    VITAL SIGNS: There were no vitals taken for this visit. There were no vitals filed for this visit.  Estimated body mass index is 27.62 kg/m as calculated from the following:   Height as of 02/05/23: 5' 9 (1.753 m).   Weight as of 02/05/23: 187 lb (84.8 kg).   PERFORMANCE STATUS (ECOG) : 1 - Symptomatic but completely ambulatory   Physical Exam General: NAD Cardiovascular: regular rate and rhythm Pulmonary: normal breathing pattern  Extremities: no edema, no joint deformities Skin: no rashes Neurological: AAO x4  Discussed the use of AI scribe software for clinical note transcription with the patient, who gave verbal consent to proceed.   IMPRESSION:  Marvin Thomas presents to clinic for follow-up. Reports overall satisfactory pain management, with occasional episodes of discomfort. He denies any associated constipation or diarrhea. Sleep quality is reported as good, and the patient denies any nausea or vomiting. His energy level is described as fluctuating, with some days better than others.  The patient denies any changes in his health status and is  due for a CT scan. Pain is not a daily occurrence and does not seem to be triggered by any specific activity or movement. Pain medication is taken approximately every other day, indicating that pain is not a constant issue.  The patient's appetite is reported as good, with a stable weight of 142 pounds. He denies any changes in appetite around treatment times, and also denies any increased fatigue post-treatment. The patient maintains an  active lifestyle, including yard work, and does not spend much time sedentary.  The patient is on Protonix  for indigestion and reports adherence to this medication regimen. He is also undergoing infusions, which he tolerates well.  We will continue to closely monitor and support as needed.  Goals of Care  11/20/22- We discussed his current illness and what it means in the larger context of his on-going co-morbidities. Natural disease trajectory and expectations were discussed.   Marvin Thomas and his sister are realistic in their understanding. They are clear in expressed goals to continue to treat the treatable allowing him every opportunity to continue thriving.   We discussed Marvin Thomas current illness and what it means in the larger context of Marvin Thomas on-going co-morbidities. Natural disease trajectory and expectations were discussed.  I discussed the importance of continued conversation with family and their medical providers regarding overall plan of care and treatment options, ensuring decisions are within the context of the patients values and GOCs.  PLAN:  Pain Intermittent pain, not daily. No specific triggers identified. Pain managed with as-needed medication. -Continue current pain management regimen. Does not require daily use.  -If need for pain medication increases, patient to contact office for refills.  Gastroesophageal Reflux Disease No reported symptoms of indigestion. Patient is compliant with daily Protonix . -Continue Protonix  as prescribed.  Cancer Treatment Patient tolerating treatments well with no reported changes in appetite, nausea, vomiting, or fatigue. Patient remains active. -Continue current treatment regimen. -Schedule for next infusion in the blue room. -Next appointment on December 3rd, 2024.  Patient expressed understanding and was in agreement with this plan. He also understands that He can call the clinic at any time with any questions, concerns, or complaints.    Any controlled substances utilized were prescribed in the context of palliative care. PDMP has been reviewed.    Visit consisted of counseling and education dealing with the complex and emotionally intense issues of symptom management and palliative care in the setting of serious and potentially life-threatening illness.  Levon Borer, AGPCNP-BC  Palliative Medicine Team/[facility]  *Please note that this is a verbal dictation therefore any spelling or grammatical errors are due to the Dragon Medical One system interpretation.

## 2023-04-02 ENCOUNTER — Inpatient Hospital Stay: Payer: No Typology Code available for payment source

## 2023-04-02 ENCOUNTER — Telehealth: Payer: Self-pay

## 2023-04-02 ENCOUNTER — Other Ambulatory Visit: Payer: Self-pay | Admitting: Nurse Practitioner

## 2023-04-02 ENCOUNTER — Inpatient Hospital Stay: Payer: No Typology Code available for payment source | Admitting: Nurse Practitioner

## 2023-04-02 DIAGNOSIS — C189 Malignant neoplasm of colon, unspecified: Secondary | ICD-10-CM

## 2023-04-02 DIAGNOSIS — Z515 Encounter for palliative care: Secondary | ICD-10-CM

## 2023-04-02 NOTE — Telephone Encounter (Signed)
 called pt and he did not know he had an appt today. Asked to be rescheduled to next week. Call transferred to scheduling
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
## 2023-04-04 ENCOUNTER — Inpatient Hospital Stay: Payer: No Typology Code available for payment source

## 2023-04-05 NOTE — Assessment & Plan Note (Deleted)
 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  His workup in the hospital revealed metastatic colon cancer to liver and lung.   -Although his biopsy of the cecal mass was negative for malignant cells, his liver biopsy confirmed adenocarcinoma and immunostain are consistent with colorectal primary.  Based on the colonoscopy findings, and severe iron deficiency, this is consistent with metastatic colon cancer -I recommend systemic therapy with FOLFOX and beva. -The goal of chemotherapy is palliative, to prolong his life and improve cancer related symptoms. -I also reviewed the NGS testing results with patient, which showed MSI stable disease, K-ras mutation positive.  He is not a candidate for EGFR inhibitor  -He is not a candidate for immunotherapy as first-line, but he does have high tumor mutation burden, and is a candidate for immunotherapy in the later line setting.  -He started FOLFOX on 11/18/2022, tolerated first cycle well. Beva was added on cycle 2 He is overall doing well.

## 2023-04-06 ENCOUNTER — Encounter: Payer: Self-pay | Admitting: Hematology

## 2023-04-07 ENCOUNTER — Other Ambulatory Visit: Payer: Self-pay

## 2023-04-07 ENCOUNTER — Inpatient Hospital Stay: Payer: No Typology Code available for payment source | Attending: Physician Assistant

## 2023-04-07 ENCOUNTER — Inpatient Hospital Stay: Payer: No Typology Code available for payment source | Admitting: Nurse Practitioner

## 2023-04-07 ENCOUNTER — Inpatient Hospital Stay: Payer: No Typology Code available for payment source | Admitting: Hematology

## 2023-04-07 ENCOUNTER — Telehealth: Payer: Self-pay | Admitting: *Deleted

## 2023-04-07 ENCOUNTER — Telehealth: Payer: Self-pay | Admitting: Hematology

## 2023-04-07 DIAGNOSIS — R609 Edema, unspecified: Secondary | ICD-10-CM | POA: Insufficient documentation

## 2023-04-07 DIAGNOSIS — E611 Iron deficiency: Secondary | ICD-10-CM | POA: Insufficient documentation

## 2023-04-07 DIAGNOSIS — C7800 Secondary malignant neoplasm of unspecified lung: Secondary | ICD-10-CM | POA: Insufficient documentation

## 2023-04-07 DIAGNOSIS — I700 Atherosclerosis of aorta: Secondary | ICD-10-CM | POA: Insufficient documentation

## 2023-04-07 DIAGNOSIS — R109 Unspecified abdominal pain: Secondary | ICD-10-CM | POA: Insufficient documentation

## 2023-04-07 DIAGNOSIS — Z5112 Encounter for antineoplastic immunotherapy: Secondary | ICD-10-CM | POA: Insufficient documentation

## 2023-04-07 DIAGNOSIS — Z886 Allergy status to analgesic agent status: Secondary | ICD-10-CM | POA: Insufficient documentation

## 2023-04-07 DIAGNOSIS — C180 Malignant neoplasm of cecum: Secondary | ICD-10-CM | POA: Insufficient documentation

## 2023-04-07 DIAGNOSIS — Z79899 Other long term (current) drug therapy: Secondary | ICD-10-CM | POA: Insufficient documentation

## 2023-04-07 DIAGNOSIS — I2510 Atherosclerotic heart disease of native coronary artery without angina pectoris: Secondary | ICD-10-CM | POA: Insufficient documentation

## 2023-04-07 DIAGNOSIS — Z5111 Encounter for antineoplastic chemotherapy: Secondary | ICD-10-CM | POA: Insufficient documentation

## 2023-04-07 DIAGNOSIS — C787 Secondary malignant neoplasm of liver and intrahepatic bile duct: Secondary | ICD-10-CM | POA: Insufficient documentation

## 2023-04-07 DIAGNOSIS — J439 Emphysema, unspecified: Secondary | ICD-10-CM | POA: Insufficient documentation

## 2023-04-07 DIAGNOSIS — R160 Hepatomegaly, not elsewhere classified: Secondary | ICD-10-CM | POA: Insufficient documentation

## 2023-04-07 NOTE — Telephone Encounter (Signed)
 Left patient a voicemail regarding scheduled appointment times/dates; was able to connect with patient contacts they also stated they would reach out to patient and confirm appointment

## 2023-04-07 NOTE — Telephone Encounter (Signed)
 Patient missed appts for lab and to see NP. This appt had been r/s from 04/02/23.  Infusion and provider informed. Scheduled for treatment tomorrow which may need to be cancelled. Contacted patient at preferred number. Voice mail full. Schedule message sent to contact patient via phone and mail if needed to r/s.

## 2023-04-08 ENCOUNTER — Encounter: Payer: Self-pay | Admitting: Nurse Practitioner

## 2023-04-08 ENCOUNTER — Inpatient Hospital Stay (HOSPITAL_BASED_OUTPATIENT_CLINIC_OR_DEPARTMENT_OTHER): Payer: No Typology Code available for payment source | Admitting: Nurse Practitioner

## 2023-04-08 ENCOUNTER — Encounter: Payer: Self-pay | Admitting: Hematology

## 2023-04-08 ENCOUNTER — Inpatient Hospital Stay: Payer: No Typology Code available for payment source

## 2023-04-08 DIAGNOSIS — C7800 Secondary malignant neoplasm of unspecified lung: Secondary | ICD-10-CM | POA: Diagnosis not present

## 2023-04-08 DIAGNOSIS — Z886 Allergy status to analgesic agent status: Secondary | ICD-10-CM | POA: Diagnosis not present

## 2023-04-08 DIAGNOSIS — R160 Hepatomegaly, not elsewhere classified: Secondary | ICD-10-CM | POA: Diagnosis not present

## 2023-04-08 DIAGNOSIS — R609 Edema, unspecified: Secondary | ICD-10-CM | POA: Diagnosis not present

## 2023-04-08 DIAGNOSIS — C189 Malignant neoplasm of colon, unspecified: Secondary | ICD-10-CM | POA: Diagnosis not present

## 2023-04-08 DIAGNOSIS — I700 Atherosclerosis of aorta: Secondary | ICD-10-CM | POA: Diagnosis not present

## 2023-04-08 DIAGNOSIS — Z79899 Other long term (current) drug therapy: Secondary | ICD-10-CM | POA: Diagnosis not present

## 2023-04-08 DIAGNOSIS — C180 Malignant neoplasm of cecum: Secondary | ICD-10-CM | POA: Diagnosis present

## 2023-04-08 DIAGNOSIS — Z5111 Encounter for antineoplastic chemotherapy: Secondary | ICD-10-CM | POA: Diagnosis present

## 2023-04-08 DIAGNOSIS — C787 Secondary malignant neoplasm of liver and intrahepatic bile duct: Secondary | ICD-10-CM

## 2023-04-08 DIAGNOSIS — E611 Iron deficiency: Secondary | ICD-10-CM | POA: Diagnosis not present

## 2023-04-08 DIAGNOSIS — Z5112 Encounter for antineoplastic immunotherapy: Secondary | ICD-10-CM | POA: Diagnosis not present

## 2023-04-08 DIAGNOSIS — J439 Emphysema, unspecified: Secondary | ICD-10-CM | POA: Diagnosis not present

## 2023-04-08 DIAGNOSIS — R109 Unspecified abdominal pain: Secondary | ICD-10-CM | POA: Diagnosis not present

## 2023-04-08 DIAGNOSIS — I2510 Atherosclerotic heart disease of native coronary artery without angina pectoris: Secondary | ICD-10-CM | POA: Diagnosis not present

## 2023-04-08 DIAGNOSIS — Z515 Encounter for palliative care: Secondary | ICD-10-CM

## 2023-04-08 LAB — CBC WITH DIFFERENTIAL (CANCER CENTER ONLY)
Abs Immature Granulocytes: 0.01 10*3/uL (ref 0.00–0.07)
Basophils Absolute: 0 10*3/uL (ref 0.0–0.1)
Basophils Relative: 1 %
Eosinophils Absolute: 0.1 10*3/uL (ref 0.0–0.5)
Eosinophils Relative: 2 %
HCT: 33.8 % — ABNORMAL LOW (ref 39.0–52.0)
Hemoglobin: 10.6 g/dL — ABNORMAL LOW (ref 13.0–17.0)
Immature Granulocytes: 0 %
Lymphocytes Relative: 15 %
Lymphs Abs: 0.9 10*3/uL (ref 0.7–4.0)
MCH: 24.7 pg — ABNORMAL LOW (ref 26.0–34.0)
MCHC: 31.4 g/dL (ref 30.0–36.0)
MCV: 78.8 fL — ABNORMAL LOW (ref 80.0–100.0)
Monocytes Absolute: 0.5 10*3/uL (ref 0.1–1.0)
Monocytes Relative: 9 %
Neutro Abs: 4.4 10*3/uL (ref 1.7–7.7)
Neutrophils Relative %: 73 %
Platelet Count: 312 10*3/uL (ref 150–400)
RBC: 4.29 MIL/uL (ref 4.22–5.81)
RDW: 14.2 % (ref 11.5–15.5)
WBC Count: 6.1 10*3/uL (ref 4.0–10.5)
nRBC: 0 % (ref 0.0–0.2)

## 2023-04-08 LAB — CMP (CANCER CENTER ONLY)
ALT: 6 U/L (ref 0–44)
AST: 11 U/L — ABNORMAL LOW (ref 15–41)
Albumin: 3.4 g/dL — ABNORMAL LOW (ref 3.5–5.0)
Alkaline Phosphatase: 83 U/L (ref 38–126)
Anion gap: 4 — ABNORMAL LOW (ref 5–15)
BUN: 28 mg/dL — ABNORMAL HIGH (ref 6–20)
CO2: 25 mmol/L (ref 22–32)
Calcium: 8.6 mg/dL — ABNORMAL LOW (ref 8.9–10.3)
Chloride: 108 mmol/L (ref 98–111)
Creatinine: 0.89 mg/dL (ref 0.61–1.24)
GFR, Estimated: >60 mL/min (ref >60)
Glucose, Bld: 102 mg/dL — ABNORMAL HIGH (ref 70–99)
Potassium: 4 mmol/L (ref 3.5–5.1)
Sodium: 137 mmol/L (ref 135–145)
Total Bilirubin: 0.3 mg/dL (ref 0.0–1.2)
Total Protein: 7 g/dL (ref 6.5–8.1)

## 2023-04-08 LAB — RAPID URINE DRUG SCREEN, HOSP PERFORMED
Amphetamines: NOT DETECTED
Barbiturates: NOT DETECTED
Benzodiazepines: NOT DETECTED
Cocaine: POSITIVE — AB
Opiates: POSITIVE — AB
Tetrahydrocannabinol: POSITIVE — AB

## 2023-04-08 LAB — TOTAL PROTEIN, URINE DIPSTICK: Protein, ur: NEGATIVE mg/dL

## 2023-04-08 MED ORDER — OXALIPLATIN CHEMO INJECTION 100 MG/20ML
85.0000 mg/m2 | Freq: Once | INTRAVENOUS | Status: AC
  Administered 2023-04-08: 150 mg via INTRAVENOUS
  Filled 2023-04-08: qty 10

## 2023-04-08 MED ORDER — DEXTROSE 5 % IV SOLN: Freq: Once | INTRAVENOUS | Status: AC

## 2023-04-08 MED ORDER — DEXTROSE 5 % IV SOLN
400.0000 mg/m2 | Freq: Once | INTRAVENOUS | Status: AC
  Administered 2023-04-08: 696 mg via INTRAVENOUS
  Filled 2023-04-08: qty 34.8

## 2023-04-08 MED ORDER — SODIUM CHLORIDE 0.9 % IV SOLN: Freq: Once | INTRAVENOUS | Status: AC

## 2023-04-08 MED ORDER — DEXAMETHASONE SODIUM PHOSPHATE 10 MG/ML IJ SOLN
10.0000 mg | Freq: Once | INTRAMUSCULAR | Status: AC
  Administered 2023-04-08: 10 mg via INTRAVENOUS
  Filled 2023-04-08: qty 1

## 2023-04-08 MED ORDER — SODIUM CHLORIDE 0.9% FLUSH: 10.0000 mL | INTRAVENOUS | Status: DC | PRN

## 2023-04-08 MED ORDER — PALONOSETRON HCL INJECTION 0.25 MG/5ML
0.2500 mg | Freq: Once | INTRAVENOUS | Status: AC
  Administered 2023-04-08: 0.25 mg via INTRAVENOUS
  Filled 2023-04-08: qty 5

## 2023-04-08 MED ORDER — HEPARIN SOD (PORK) LOCK FLUSH 100 UNIT/ML IV SOLN: 500.0000 [IU] | Freq: Once | INTRAVENOUS | Status: DC | PRN

## 2023-04-08 MED ORDER — SODIUM CHLORIDE 0.9 % IV SOLN
5.0000 mg/kg | Freq: Once | INTRAVENOUS | Status: AC
  Administered 2023-04-08: 300 mg via INTRAVENOUS
  Filled 2023-04-08: qty 12

## 2023-04-08 MED ORDER — FLUOROURACIL CHEMO INJECTION 5 GM/100ML
2400.0000 mg/m2 | INTRAVENOUS | Status: DC
  Administered 2023-04-08: 4200 mg via INTRAVENOUS
  Filled 2023-04-08: qty 84

## 2023-04-08 NOTE — Patient Instructions (Signed)
 [facility] - A DEPT OF [facility]  Discharge Instructions: Thank you for choosing [facility] to provide your oncology and hematology care.   If you have a lab appointment with the Cancer Center, please go directly to the Cancer Center and check in at the registration area.   Wear comfortable clothing and clothing appropriate for easy access to any Portacath or PICC line.   We strive to give you quality time with your provider. You may need to reschedule your appointment if you arrive late (15 or more minutes).  Arriving late affects you and other patients whose appointments are after yours.  Also, if you miss three or more appointments without notifying the office, you may be dismissed from the clinic at the provider's discretion.      For prescription refill requests, have your pharmacy contact our office and allow 72 hours for refills to be completed.    Today you received the following chemotherapy and/or immunotherapy agents: Mvasi , Leucovorin , Oxaliplatin , Fluorouracil .       To help prevent nausea and vomiting after your treatment, we encourage you to take your nausea medication as directed.  BELOW ARE SYMPTOMS THAT SHOULD BE REPORTED IMMEDIATELY: *FEVER GREATER THAN 100.4 F (38 C) OR HIGHER *CHILLS OR SWEATING *NAUSEA AND VOMITING THAT IS NOT CONTROLLED WITH YOUR NAUSEA MEDICATION *UNUSUAL SHORTNESS OF BREATH *UNUSUAL BRUISING OR BLEEDING *URINARY PROBLEMS (pain or burning when urinating, or frequent urination) *BOWEL PROBLEMS (unusual diarrhea, constipation, pain near the anus) TENDERNESS IN MOUTH AND THROAT WITH OR WITHOUT PRESENCE OF ULCERS (sore throat, sores in mouth, or a toothache) UNUSUAL RASH, SWELLING OR PAIN  UNUSUAL VAGINAL DISCHARGE OR ITCHING   Items with * indicate a potential emergency and should be followed up as soon as possible or go to the Emergency Department if any problems should occur.  Please show the  CHEMOTHERAPY ALERT CARD or IMMUNOTHERAPY ALERT CARD at check-in to the Emergency Department and triage nurse.  Should you have questions after your visit or need to cancel or reschedule your appointment, please contact [facility] - A DEPT OF [facility]  Dept: [phone]  and follow the prompts.  Office hours are 8:00 a.m. to 4:30 p.m. Monday - Friday. Please note that voicemails left after 4:00 p.m. may not be returned until the following business day.  We are closed weekends and major holidays. You have access to a nurse at all times for urgent questions. Please call the main number to the clinic Dept: [phone] and follow the prompts.   For any non-urgent questions, you may also contact your provider using MyChart. We now offer e-Visits for anyone 68 and older to request care online for non-urgent symptoms. For details visit [email or web address].   Also download the MyChart app! Go to the app store, search "MyChart", open the app, select [place], and log in with your MyChart username and password.
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
## 2023-04-08 NOTE — Progress Notes (Signed)
 Per Delvin File NP, ok to proceed with pre-medications with Urine Drug Screen Pending.

## 2023-04-09 ENCOUNTER — Encounter: Payer: Self-pay | Admitting: Hematology

## 2023-04-10 ENCOUNTER — Telehealth: Payer: Self-pay

## 2023-04-10 ENCOUNTER — Inpatient Hospital Stay: Payer: No Typology Code available for payment source

## 2023-04-10 NOTE — Telephone Encounter (Signed)
Tried calling pt regarding appt today for Pump d/c.  Pt did not answer the telephone and this nurse was unable to leave a voicemail d/t pt's mailbox is full.  Contacted pt's sister Fredirick Maudlin regarding pt's Pump d/c appt today.  Spoke with Misty Stanley regarding pt's pump d/c appt scheduled today at 1500.  Misty Stanley stated she spoke with the pt on 04/09/2023 regarding the appt so the pt is aware of the appt for today.  Misty Stanley stated she's not sure what is going on with the pt but she's going to contact their mom to see if she could get in contact with pt and bring him to the Cancer [facility] for pump d/c.  Stated that the pt needs to be at the [facility] no later than 1700.  Stated that if the pt does not arrive by this time the pt will need to go to the ED for port de-access.  Notified the Infusion Charge Nurse of the conversation and she notified the closing RN's.  Dr. Mosetta Putt made aware of the situation.

## 2023-04-15 ENCOUNTER — Other Ambulatory Visit: Payer: No Typology Code available for payment source

## 2023-04-15 ENCOUNTER — Ambulatory Visit: Payer: No Typology Code available for payment source | Admitting: Nurse Practitioner

## 2023-04-15 ENCOUNTER — Ambulatory Visit: Payer: No Typology Code available for payment source

## 2023-04-21 ENCOUNTER — Inpatient Hospital Stay: Payer: No Typology Code available for payment source

## 2023-04-21 ENCOUNTER — Inpatient Hospital Stay: Payer: No Typology Code available for payment source | Admitting: Hematology

## 2023-04-21 NOTE — Assessment & Plan Note (Deleted)
 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  His workup in the hospital revealed metastatic colon cancer to liver and lung.   -Although his biopsy of the cecal mass was negative for malignant cells, his liver biopsy confirmed adenocarcinoma and immunostain are consistent with colorectal primary.  Based on the colonoscopy findings, and severe iron deficiency, this is consistent with metastatic colon cancer -I recommend systemic therapy with FOLFOX and beva. -The goal of chemotherapy is palliative, to prolong his life and improve cancer related symptoms. -I also reviewed the NGS testing results with patient, which showed MSI stable disease, K-ras mutation positive.  He is not a candidate for EGFR inhibitor  -He is not a candidate for immunotherapy as first-line, but he does have high tumor mutation burden, and is a candidate for immunotherapy in the later line setting.  -He started FOLFOX on 11/18/2022, tolerated first cycle well. Beva was added on cycle 2 He is overall doing well.

## 2023-04-22 ENCOUNTER — Other Ambulatory Visit (HOSPITAL_COMMUNITY): Payer: Self-pay

## 2023-04-22 ENCOUNTER — Telehealth: Payer: Self-pay | Admitting: Pharmacy Technician

## 2023-04-22 ENCOUNTER — Inpatient Hospital Stay: Payer: No Typology Code available for payment source | Admitting: Hematology

## 2023-04-22 ENCOUNTER — Inpatient Hospital Stay: Payer: No Typology Code available for payment source

## 2023-04-22 ENCOUNTER — Encounter: Payer: Self-pay | Admitting: Hematology

## 2023-04-22 ENCOUNTER — Inpatient Hospital Stay (HOSPITAL_BASED_OUTPATIENT_CLINIC_OR_DEPARTMENT_OTHER): Payer: No Typology Code available for payment source | Admitting: Hematology

## 2023-04-22 ENCOUNTER — Telehealth: Payer: Self-pay | Admitting: Pharmacist

## 2023-04-22 ENCOUNTER — Other Ambulatory Visit: Payer: Self-pay

## 2023-04-22 VITALS — BP 109/58 | HR 59 | Temp 97.9°F | Resp 18 | Wt 136.0 lb

## 2023-04-22 DIAGNOSIS — C189 Malignant neoplasm of colon, unspecified: Secondary | ICD-10-CM | POA: Diagnosis not present

## 2023-04-22 DIAGNOSIS — Z7189 Other specified counseling: Secondary | ICD-10-CM

## 2023-04-22 DIAGNOSIS — C787 Secondary malignant neoplasm of liver and intrahepatic bile duct: Secondary | ICD-10-CM

## 2023-04-22 DIAGNOSIS — G893 Neoplasm related pain (acute) (chronic): Secondary | ICD-10-CM

## 2023-04-22 DIAGNOSIS — D500 Iron deficiency anemia secondary to blood loss (chronic): Secondary | ICD-10-CM

## 2023-04-22 DIAGNOSIS — Z5112 Encounter for antineoplastic immunotherapy: Secondary | ICD-10-CM | POA: Diagnosis not present

## 2023-04-22 DIAGNOSIS — Z515 Encounter for palliative care: Secondary | ICD-10-CM

## 2023-04-22 LAB — CMP (CANCER CENTER ONLY)
ALT: 7 U/L (ref 0–44)
AST: 11 U/L — ABNORMAL LOW (ref 15–41)
Albumin: 3.5 g/dL (ref 3.5–5.0)
Alkaline Phosphatase: 81 U/L (ref 38–126)
Anion gap: 5 (ref 5–15)
BUN: 23 mg/dL — ABNORMAL HIGH (ref 6–20)
CO2: 25 mmol/L (ref 22–32)
Calcium: 8.4 mg/dL — ABNORMAL LOW (ref 8.9–10.3)
Chloride: 109 mmol/L (ref 98–111)
Creatinine: 0.78 mg/dL (ref 0.61–1.24)
GFR, Estimated: >60 mL/min (ref >60)
Glucose, Bld: 91 mg/dL (ref 70–99)
Potassium: 4 mmol/L (ref 3.5–5.1)
Sodium: 139 mmol/L (ref 135–145)
Total Bilirubin: 0.3 mg/dL (ref 0.0–1.2)
Total Protein: 6.8 g/dL (ref 6.5–8.1)

## 2023-04-22 LAB — CBC WITH DIFFERENTIAL (CANCER CENTER ONLY)
Abs Immature Granulocytes: 0.01 10*3/uL (ref 0.00–0.07)
Basophils Absolute: 0 10*3/uL (ref 0.0–0.1)
Basophils Relative: 0 %
Eosinophils Absolute: 0.1 10*3/uL (ref 0.0–0.5)
Eosinophils Relative: 2 %
HCT: 34 % — ABNORMAL LOW (ref 39.0–52.0)
Hemoglobin: 10.7 g/dL — ABNORMAL LOW (ref 13.0–17.0)
Immature Granulocytes: 0 %
Lymphocytes Relative: 21 %
Lymphs Abs: 1.2 10*3/uL (ref 0.7–4.0)
MCH: 24.9 pg — ABNORMAL LOW (ref 26.0–34.0)
MCHC: 31.5 g/dL (ref 30.0–36.0)
MCV: 79.3 fL — ABNORMAL LOW (ref 80.0–100.0)
Monocytes Absolute: 0.5 10*3/uL (ref 0.1–1.0)
Monocytes Relative: 9 %
Neutro Abs: 3.7 10*3/uL (ref 1.7–7.7)
Neutrophils Relative %: 68 %
Platelet Count: 199 10*3/uL (ref 150–400)
RBC: 4.29 MIL/uL (ref 4.22–5.81)
RDW: 15.5 % (ref 11.5–15.5)
WBC Count: 5.5 10*3/uL (ref 4.0–10.5)
nRBC: 0 % (ref 0.0–0.2)

## 2023-04-22 LAB — TOTAL PROTEIN, URINE DIPSTICK: Protein, ur: NEGATIVE mg/dL

## 2023-04-22 LAB — RAPID URINE DRUG SCREEN, HOSP PERFORMED
Amphetamines: NOT DETECTED
Barbiturates: NOT DETECTED
Benzodiazepines: NOT DETECTED
Cocaine: POSITIVE — AB
Opiates: NOT DETECTED
Tetrahydrocannabinol: POSITIVE — AB

## 2023-04-22 LAB — FERRITIN: Ferritin: 71 ng/mL (ref 24–336)

## 2023-04-22 MED ORDER — SODIUM CHLORIDE 0.9% FLUSH
10.0000 mL | INTRAVENOUS | Status: DC | PRN
  Administered 2023-04-22: 10 mL via INTRAVENOUS

## 2023-04-22 MED ORDER — CAPECITABINE 500 MG PO TABS
850.0000 mg/m2 | ORAL_TABLET | Freq: Two times a day (BID) | ORAL | 1 refills | Status: DC
  Filled 2023-04-24 – 2023-05-06 (×2): qty 84, 21d supply, fill #0

## 2023-04-22 MED ORDER — HEPARIN SOD (PORK) LOCK FLUSH 100 UNIT/ML IV SOLN
250.0000 [IU] | Freq: Once | INTRAVENOUS | Status: AC | PRN
  Administered 2023-04-22: 250 [IU]

## 2023-04-22 NOTE — Progress Notes (Signed)
Per Lowella Bandy, NP, orders placed

## 2023-04-22 NOTE — Telephone Encounter (Signed)
Oral Oncology Patient Advocate Encounter   Received notification that prior authorization for capecitabine is required.   PA submitted on 04/22/23 Key [id number] Status is pending     Jinger Neighbors, CPhT-Adv Oncology Pharmacy Patient Advocate [facility] Direct Number: [phone]  Fax: [phone]

## 2023-04-22 NOTE — Progress Notes (Signed)
[facility]   Telephone:(336) [phone] Fax:(336) [phone]   Clinic Follow up Note   Patient Care Team: [name], MD as PCP - General (Hematology) Patient, No Pcp Per (General Practice) [name], MD as Consulting Physician (Hematology and Oncology) Pickenpack-Cousar, [name], NP as Nurse Practitioner [facility] and Palliative Medicine)  Date of Service:  04/22/2023  CHIEF COMPLAINT: f/u of metastatic colon cancer  CURRENT THERAPY:  FOLFOX every 2 weeks  Assessment and Plan     Metastatic colon cancer to liver (HCC) -stage IV with liver and lung metastasis, MMR normal, KRAS G12A mutation(+), TMB 10.5  -Patient presented with worsening abdominal pain for the past 3 to 4 months.  His workup in the hospital revealed metastatic colon cancer to liver and lung.   -Although his biopsy of the cecal mass was negative for malignant cells, his liver biopsy confirmed adenocarcinoma and immunostain are consistent with colorectal primary.  Based on the colonoscopy findings, and severe iron deficiency, this is consistent with metastatic colon cancer -I recommend systemic therapy with FOLFOX and beva. -The goal of chemotherapy is palliative, to prolong his life and improve cancer related symptoms. -I also reviewed the NGS testing results with patient, which showed MSI stable disease, K-ras mutation positive.  He is not a candidate for EGFR inhibitor  -He is not a candidate for immunotherapy as first-line, but he does have high tumor mutation burden, and is a candidate for immunotherapy in the later line setting.  -He started FOLFOX on 11/18/2022, tolerated first cycle well. 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  He did not return for pump DC on April 10, 2023 and we were not able to reach him.  So I changed his chemo treatment to CapeOx on April 22, 2023, -His a urine drug screen was still positive  for cocaine today, will hold on oxaliplatin and bevacizumab -I called in capecitabine 1500 mg twice daily for day 1-14 every 21 days today, he will start when he received it. -He is overdue for CT scan, we scheduled for him in 2 weeks, and informed him about appointment. -Follow-up in 3 weeks.     SUMMARY OF ONCOLOGIC HISTORY: Oncology History Overview Note   Cancer Staging  Metastatic colon cancer to liver [facility]) Staging form: Colon and Rectum, AJCC 8th Edition - Clinical stage from 10/24/2022: Stage IVB (cTX, cN0, pM1b) - Signed by [name], MD on 10/31/2022 Total positive nodes: 0     Metastatic colon cancer to liver (HCC)  10/17/2022 Imaging   CT CHEST W CONTRAST   IMPRESSION: 3 cm mass in the posterior right upper lobe with some dystrophic calcification. In addition there is an abnormal precarinal lymph node. Malignancy is possible. Recommend further workup such as PET-CT scan when clinically appropriate.   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 The previous larger bulla in the right lung apex is not seen on the current study.   Coronary artery calcifications. Please correlate for other coronary risk factors.   Aortic Atherosclerosis (ICD10-I70.0) and Emphysema (ICD10-J43.9).     10/24/2022 Cancer Staging   Staging form: Colon and Rectum, AJCC 8th Edition - Clinical stage from 10/24/2022: Stage IVB (cTX, cN0, pM1b) - Signed by [name], MD on 10/31/2022 Total positive nodes: 0   10/29/2022 Imaging   C ABDOMEN PELVIS W CONTRAST   IMPRESSION: 1. 3.3 x 3.2 x 2.6 cm rounded masslike  wall abnormality in the mid transverse colon just left of the midline, suspected to be a primary tumor. This fills most of luminal volume but there is no evidence of an upstream mechanical obstruction. 2. Four metastatic liver masses are again noted, with the largest lesion in segment 8 measuring 5.1 cm, previously 4.3 cm. 3. Mesenteric edema which could be congestive, due to  malnutrition or hepatic dysfunction. Some of this is seen adjacent to the pancreas. It is possible the patient could have pancreatitis but no peripancreatic fluid or ductal dilatation is seen. Correlate with serum lipase. 4. Diverticulosis and moderate fecal stasis. 5. Aortic atherosclerosis.   10/31/2022 Initial Diagnosis   Metastatic colon cancer to liver (HCC)   11/06/2022 Miscellaneous      11/18/2022 - 04/08/2023 Chemotherapy   Patient is on Treatment Plan : COLORECTAL FOLFOX + Bevacizumab q14d     05/13/2023 -  Chemotherapy   Patient is on Treatment Plan : COLORECTAL CapeOx + Bevacizumab q21d        Discussed the use of AI scribe software for clinical note transcription with the patient, who gave verbal consent to proceed.  History of Present Illness    Marvin Thomas returns for follow-up today.  Since his last treatment on April 08, 2023, we have not been able to get a hold of him.  He did not return for pump DC after last chemo treatment.  He removed the needle from his port by himself.  He is still working part-time, states that he is doing fine, denies any significant pain or nausea.  No other new complaints.      All other systems were reviewed with the patient and are negative.  MEDICAL HISTORY:  Past Medical History:  Diagnosis Date   Anemia Dx 2014   Gallstones    Hemothorax    Ulcer     SURGICAL HISTORY: Past Surgical History:  Procedure Laterality Date   BIOPSY  10/18/2022   Procedure: BIOPSY;  Surgeon: Kathi Der, MD;  [facility];  Service: Gastroenterology;;   BIOPSY  10/24/2022   Procedure: BIOPSY;  Surgeon: Kerin Salen, MD;  [facility];  Service: Gastroenterology;;   COLONOSCOPY WITH PROPOFOL N/A 10/18/2022   Procedure: COLONOSCOPY WITH PROPOFOL;  Surgeon: Kathi Der, MD;  [facility];  Service: Gastroenterology;  Laterality: N/A;   COLONOSCOPY WITH PROPOFOL N/A 10/24/2022   Procedure: COLONOSCOPY WITH PROPOFOL;   Surgeon: Kerin Salen, MD;  [facility];  Service: Gastroenterology;  Laterality: N/A;   ESOPHAGOGASTRODUODENOSCOPY (EGD) WITH PROPOFOL N/A 10/18/2022   Procedure: ESOPHAGOGASTRODUODENOSCOPY (EGD) WITH PROPOFOL;  Surgeon: Kathi Der, MD;  [facility];  Service: Gastroenterology;  Laterality: N/A;   IR IMAGING GUIDED PORT INSERTION  11/12/2022   LAPAROTOMY N/A 12/09/2017   Procedure: EXPLORATORY LAPAROTOMY, REPAIR PERFORATED PYLEORIC ULCER;  Surgeon: Almond Lint, MD;  [facility];  Service: General;  Laterality: N/A;   POLYPECTOMY  10/24/2022   Procedure: POLYPECTOMY;  Surgeon: Kerin Salen, MD;  [facility];  Service: Gastroenterology;;   SUBMUCOSAL TATTOO INJECTION  10/18/2022   Procedure: SUBMUCOSAL TATTOO INJECTION;  Surgeon: Kathi Der, MD;  [facility];  Service: Gastroenterology;;    I have reviewed the social history and family history with the patient and they are unchanged from previous note.  ALLERGIES:  is allergic to aspirin.  MEDICATIONS:  Current Outpatient Medications  Medication Sig Dispense Refill   capecitabine (XELODA) 500 MG tablet Take 3 tablets (1,500 mg total) by mouth 2 (two) times daily  after a meal. Take for 14 days then off for 7 days 84 tablet 1   acetaminophen (TYLENOL) 500 MG tablet Take 2 tablets (1,000 mg total) by mouth every 6 (six) hours as needed for mild pain (pain score 1-3) or headache. 30 tablet 2   BENADRYL ALLERGY 25 MG tablet Take 1-2 tablets (25-50 mg total) by mouth every 6 (six) hours as needed for sleep. 30 tablet 2   cyanocobalamin 1000 MCG tablet Take 1 tablet (1,000 mcg total) by mouth daily. 30 tablet 2   pantoprazole (PROTONIX) 40 MG tablet Take 1 tablet (40 mg total) by mouth daily. 30 tablet 3   potassium chloride SA (KLOR-CON M) 20 MEQ tablet Take 1 tablet (20 mEq total) by mouth daily. 30 tablet 0   No current facility-administered medications for this visit.    Facility-Administered Medications Ordered in Other Visits  Medication Dose Route Frequency Provider Last Rate Last Admin   sodium chloride flush (NS) 0.9 % injection 10 mL  10 mL Intravenous PRN [name], MD   10 mL at 04/22/23 1229    PHYSICAL EXAMINATION: ECOG PERFORMANCE STATUS: 1 - Symptomatic but completely ambulatory  There were no vitals filed for this visit. Wt Readings from Last 3 Encounters:  04/22/23 136 lb (61.7 kg)  04/08/23 138 lb (62.6 kg)  02/05/23 187 lb (84.8 kg)     GENERAL:alert, no distress and comfortable SKIN: skin color, texture, turgor are normal, no rashes or significant lesions EYES: normal, Conjunctiva are pink and non-injected, sclera clear NECK: supple, thyroid normal size, non-tender, without nodularity LYMPH:  no palpable lymphadenopathy in the cervical, axillary  LUNGS: clear to auscultation and percussion with normal breathing effort HEART: regular rate & rhythm and no murmurs and no lower extremity edema ABDOMEN:abdomen soft, non-tender and normal bowel sounds Musculoskeletal:no cyanosis of digits and no clubbing  NEURO: alert & oriented x 3 with fluent speech, no focal motor/sensory deficits   LABORATORY DATA:  I have reviewed the data as listed    Latest Ref Rng & Units 04/22/2023   10:44 AM 04/08/2023    8:38 AM 01/27/2023   10:27 AM  CBC  WBC 4.0 - 10.5 K/uL 5.5  6.1  4.9   Hemoglobin 13.0 - 17.0 g/dL 40.9  81.1  91.4   Hematocrit 39.0 - 52.0 % 34.0  33.8  32.1   Platelets 150 - 400 K/uL 199  312  229         Latest Ref Rng & Units 04/22/2023   10:44 AM 04/08/2023    8:38 AM 01/27/2023   10:27 AM  CMP  Glucose 70 - 99 mg/dL 91  782  956   BUN 6 - 20 mg/dL 23  28  19    Creatinine 0.61 - 1.24 mg/dL 2.13  0.86  5.78   Sodium 135 - 145 mmol/L 139  137  142   Potassium 3.5 - 5.1 mmol/L 4.0  4.0  3.3   Chloride 98 - 111 mmol/L 109  108  111   CO2 22 - 32 mmol/L 25  25  25    Calcium 8.9 - 10.3 mg/dL 8.4  8.6  8.3   Total Protein  6.5 - 8.1 g/dL 6.8  7.0  6.2   Total Bilirubin 0.0 - 1.2 mg/dL 0.3  0.3  0.3   Alkaline Phos 38 - 126 U/L 81  83  80   AST 15 - 41 U/L 11  11  15    ALT 0 -  44 U/L 7  6  6        RADIOGRAPHIC STUDIES: I have personally reviewed the radiological images as listed and agreed with the findings in the report. No results found.    Orders Placed This Encounter  Procedures   Consent Attestation for Oncology Treatment    The patient is informed of risks, benefits, side-effects of the prescribed oncology treatment. Potential short term and long term side effects and response rates discussed. After a long discussion, the patient made informed decision to proceed.:   Yes   CBC with Differential (Cancer Center Only)    Standing Status:   Future    Expected Date:   05/13/2023    Expiration Date:   05/12/2024   CMP (Cancer Center only)    Standing Status:   Future    Expected Date:   05/13/2023    Expiration Date:   05/12/2024   CBC with Differential (Cancer Center Only)    Standing Status:   Future    Expected Date:   06/03/2023    Expiration Date:   06/02/2024   CMP (Cancer Center only)    Standing Status:   Future    Expected Date:   06/03/2023    Expiration Date:   06/02/2024   Total Protein, Urine dipstick    Standing Status:   Future    Expected Date:   06/03/2023    Expiration Date:   06/02/2024   PHYSICIAN COMMUNICATION ORDER    UA Protein, dipstick required prior to every 3rd cycle bevacizumab   ONCBCN PHYSICIAN COMMUNICATION 1    0 Number of doses of oxaliplatin received at [facility] or outside facility. signature of Provider. If patient has received greater than 5 doses of oxaliplatin, the following pre-medications should be ordered: dexamethasone, diphenhydramine, and formulary histamine H2 antagonist. If patient cannot tolerate oral histamine H2 antagonist, IV may be given.   All questions were answered. The patient knows to call the clinic with any problems, questions or concerns. No  barriers to learning was detected. The total time spent in the appointment was 30 minutes.     [name], MD 04/22/2023

## 2023-04-22 NOTE — Telephone Encounter (Addendum)
Oral Chemotherapy Pharmacist Encounter  I spoke with patient in infusion for overview of: Xeloda (capecitabine) for the  treatment of metastatic colon cancer cancer, in conjunction with oxaliplatin, planned duration until disease progression or unacceptable drug toxicity.  Counseled patient on administration, dosing, side effects, monitoring, drug-food interactions, safe handling, storage, and disposal.  CBC w/ Diff and CMP from 04/22/23 assessed, no relevant lab abnormalities requiring baseline dose adjustment required at this time. Prescription dose and frequency assessed for appropriateness.  Patient will take Xeloda 500mg  tablets, 3 tablets (1500mg ) by mouth in AM and 3 tabs (1500mg ) by mouth in PM, within 30 minutes of finishing meals, for 14 days on, 7 days off, repeated every 21 days.  Xeloda start date: 04/25/23 (patient to pick up from pharmacy on 2/1)  Adverse effects include but are not limited to: fatigue, decreased blood counts, GI upset, diarrhea, mouth sores, and hand-foot syndrome. Hand-foot syndrome: discussed use of cream such as [place]der[place] Smooth Extra Care 20 or equivalent advanced care cream that has 20% urea content for advanced skin hydration while on Xeloda. Additionally discussed use of OTC Voltaren gel for HFS prophylaxis. Discussed recommended use of Voltaren gel is 1 finger tip application for front/backside of hands and then 1 fingertip application to bottoms of feet twice a day for up to 12 weeks.  Diarrhea: Patient will obtain Imodium (loperamide) to have on hand if they experience diarrhea. Patient knows to alert the office of 4 or more loose stools above baseline.  Reviewed with patient importance of keeping a medication schedule and plan for any missed doses. No barriers to medication adherence identified.  Medication reconciliation performed and medication/allergy list updated. Current medication list in Epic reviewed, DDIs with Xeloda identified: Category C DDI  between Xeloda and Pantoprazole - proton-pump inhibitors can decrease efficacy of Xeloda - discussed with patient alternatives to pantoprazole, such as H2RA's like famotidine while on Xeloda. Patient expressed understanding.  All questions answered.  Marvin Thomas voiced understanding and appreciation.   Medication education handout given to patient. Patient knows to call the office with questions or concerns. Oral Chemotherapy Clinic phone number provided to patient.   Sherry Ruffing, PharmD, BCPS, BCOP Hematology/Oncology Clinical Pharmacist [facility] Oral [facility]otherapy Navigation Clin[facility] [phone] 04/22/2023 1:38 PM

## 2023-04-22 NOTE — Progress Notes (Signed)
Per Mosetta Putt MD, no tx today due to positive UDS. Port-a-cath deacessed and pt sent home.

## 2023-04-22 NOTE — Assessment & Plan Note (Signed)
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  His workup in the hospital revealed metastatic colon cancer to liver and lung.   -Although his biopsy of the cecal mass was negative for malignant cells, his liver biopsy confirmed adenocarcinoma and immunostain are consistent with colorectal primary.  Based on the colonoscopy findings, and severe iron deficiency, this is consistent with metastatic colon cancer -I recommend systemic therapy with FOLFOX and beva. -The goal of chemotherapy is palliative, to prolong his life and improve cancer related symptoms. -I also reviewed the NGS testing results with patient, which showed MSI stable disease, K-ras mutation positive.  He is not a candidate for EGFR inhibitor  -He is not a candidate for immunotherapy as first-line, but he does have high tumor mutation burden, and is a candidate for immunotherapy in the later line setting.  -He started FOLFOX on 11/18/2022, tolerated first cycle well. 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  He did not return for pump DC on April 10, 2023 and we were not able to reach him.  So I changed his chemo treatment to CapeOx on April 22, 2023,

## 2023-04-23 ENCOUNTER — Other Ambulatory Visit (HOSPITAL_COMMUNITY): Payer: Self-pay

## 2023-04-23 NOTE — Telephone Encounter (Signed)
Oral Oncology Patient Advocate Encounter  Prior Authorization for capecitabine has been approved.    PA# [id number] Effective dates: 04/23/23 through 07/22/23  Patients co-pay is $49.63.   Patient has [employer] [employer] Medicaid secondary. Final copay is $4.  Jinger Neighbors, CPhT-Adv Oncology Pharmacy Patient Advocate [facility] Direct Number: [phone]  Fax: [phone]

## 2023-04-23 NOTE — Telephone Encounter (Signed)
Oral Oncology Patient Advocate Encounter  Called patient to schedule first fill of capecitabine. VM was full.  Called patient's sister, Misty Stanley, and left vm with my number for patient to call back if she gets in touch with him  Jinger Neighbors, CPhT-Adv Oncology Pharmacy Patient Advocate [facility] Direct Number: [phone]  Fax: [phone]

## 2023-04-24 ENCOUNTER — Other Ambulatory Visit: Payer: Self-pay | Admitting: Pharmacy Technician

## 2023-04-24 ENCOUNTER — Inpatient Hospital Stay: Payer: No Typology Code available for payment source

## 2023-04-24 ENCOUNTER — Other Ambulatory Visit: Payer: Self-pay

## 2023-04-24 NOTE — Progress Notes (Signed)
Oral Chemotherapy Pharmacist Encounter  Patient was counseled under telephone encounter from 04/22/23.  Sherry Ruffing, PharmD, BCPS, BCOP Hematology/Oncology Clinical Pharmacist [facility] Oral Chemotherapy Navigation Clinics [phone] 04/24/2023 2:02 PM

## 2023-04-24 NOTE — Progress Notes (Signed)
Specialty Pharmacy Initial Fill Coordination Note  KI CORBO is a 58 y.o. male contacted today regarding refills of specialty medication(s) Capecitabine (XELODA) .  Patient requested Daryll Drown at [facility] at [place]  on 04/25/23   Medication will be filled on 04/24/23.   Patient is aware of $4 copayment.

## 2023-04-25 ENCOUNTER — Encounter: Payer: Self-pay | Admitting: Hematology

## 2023-04-29 ENCOUNTER — Other Ambulatory Visit: Payer: No Typology Code available for payment source

## 2023-04-29 ENCOUNTER — Ambulatory Visit: Payer: No Typology Code available for payment source | Admitting: Hematology

## 2023-04-29 ENCOUNTER — Ambulatory Visit: Payer: No Typology Code available for payment source

## 2023-05-05 ENCOUNTER — Other Ambulatory Visit: Payer: Self-pay

## 2023-05-06 ENCOUNTER — Ambulatory Visit: Payer: No Typology Code available for payment source

## 2023-05-06 ENCOUNTER — Other Ambulatory Visit (HOSPITAL_COMMUNITY): Payer: Self-pay

## 2023-05-06 ENCOUNTER — Other Ambulatory Visit: Payer: Self-pay

## 2023-05-06 ENCOUNTER — Ambulatory Visit: Payer: No Typology Code available for payment source | Admitting: Nurse Practitioner

## 2023-05-06 ENCOUNTER — Other Ambulatory Visit: Payer: No Typology Code available for payment source

## 2023-05-06 NOTE — Progress Notes (Signed)
Specialty Pharmacy Refill Coordination Note  Marvin Thomas is a 58 y.o. male contacted today regarding refills of specialty medication(s) Capecitabine Lorri Frederick)   Patient requested Delivery   Delivery date: 05/08/23   Verified address: [address]   [place] [address]   Medication will be filled on 05/07/23.

## 2023-05-07 ENCOUNTER — Ambulatory Visit (HOSPITAL_COMMUNITY)
Admission: RE | Admit: 2023-05-07 | Discharge: 2023-05-07 | Disposition: A | Discharge status: Home / Self Care | Payer: No Typology Code available for payment source | Source: Ambulatory Visit | Attending: Nurse Practitioner | Admitting: Nurse Practitioner

## 2023-05-07 ENCOUNTER — Other Ambulatory Visit: Payer: Self-pay

## 2023-05-07 DIAGNOSIS — C189 Malignant neoplasm of colon, unspecified: Secondary | ICD-10-CM | POA: Insufficient documentation

## 2023-05-07 DIAGNOSIS — C787 Secondary malignant neoplasm of liver and intrahepatic bile duct: Secondary | ICD-10-CM | POA: Diagnosis present

## 2023-05-07 MED ORDER — HEPARIN SOD (PORK) LOCK FLUSH 100 UNIT/ML IV SOLN
500.0000 [IU] | Freq: Once | INTRAVENOUS | Status: AC
Start: 2023-05-07 — End: 2023-05-07
  Administered 2023-05-07: 500 [IU] via INTRAVENOUS

## 2023-05-07 MED ORDER — IOHEXOL 300 MG/ML  SOLN
100.0000 mL | Freq: Once | INTRAMUSCULAR | Status: AC | PRN
  Administered 2023-05-07: 100 mL via INTRAVENOUS

## 2023-05-07 MED ORDER — IOHEXOL 300 MG/ML  SOLN
30.0000 mL | Freq: Once | INTRAMUSCULAR | Status: AC | PRN
  Administered 2023-05-07: 30 mL via ORAL

## 2023-05-07 MED ORDER — HEPARIN SOD (PORK) LOCK FLUSH 100 UNIT/ML IV SOLN
INTRAVENOUS | Status: AC
  Filled 2023-05-07: qty 5

## 2023-05-12 NOTE — Assessment & Plan Note (Deleted)
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  His workup in the hospital revealed metastatic colon cancer to liver and lung.   -Although his biopsy of the cecal mass was negative for malignant cells, his liver biopsy confirmed adenocarcinoma and immunostain are consistent with colorectal primary.  Based on the colonoscopy findings, and severe iron deficiency, this is consistent with metastatic colon cancer -I recommend systemic therapy with FOLFOX and beva. -The goal of chemotherapy is palliative, to prolong his life and improve cancer related symptoms. -I also reviewed the NGS testing results with patient, which showed MSI stable disease, K-ras mutation positive.  He is not a candidate for EGFR inhibitor  -He is not a candidate for immunotherapy as first-line, but he does have high tumor mutation burden, and is a candidate for immunotherapy in the later line setting.  -He started FOLFOX on 11/18/2022, tolerated first cycle well. 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  He did not return for pump DC on April 10, 2023 and we were not able to reach him.  So I changed his chemo treatment to CapeOx on April 22, 2023. Due to his continuous drug abuse, iv oxaliplatin was held

## 2023-05-13 ENCOUNTER — Telehealth: Payer: Self-pay | Admitting: Hematology

## 2023-05-13 ENCOUNTER — Inpatient Hospital Stay: Payer: No Typology Code available for payment source | Admitting: Hematology

## 2023-05-13 ENCOUNTER — Inpatient Hospital Stay: Payer: No Typology Code available for payment source

## 2023-05-13 DIAGNOSIS — C189 Malignant neoplasm of colon, unspecified: Secondary | ICD-10-CM

## 2023-05-13 NOTE — Telephone Encounter (Signed)
 Left patient a voicemail in regards to rescheduled appointment times/dates, left callback number if needed for scheduling

## 2023-05-19 ENCOUNTER — Other Ambulatory Visit: Payer: Self-pay

## 2023-05-19 ENCOUNTER — Other Ambulatory Visit (HOSPITAL_COMMUNITY): Payer: Self-pay

## 2023-05-19 NOTE — Assessment & Plan Note (Signed)
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  His workup in the hospital revealed metastatic colon cancer to liver and lung.   -Although his biopsy of the cecal mass was negative for malignant cells, his liver biopsy confirmed adenocarcinoma and immunostain are consistent with colorectal primary.  Based on the colonoscopy findings, and severe iron deficiency, this is consistent with metastatic colon cancer -I recommend systemic therapy with FOLFOX and beva. -The goal of chemotherapy is palliative, to prolong his life and improve cancer related symptoms. -I also reviewed the NGS testing results with patient, which showed MSI stable disease, K-ras mutation positive.  He is not a candidate for EGFR inhibitor  -He is not a candidate for immunotherapy as first-line, but he does have high tumor mutation burden, and is a candidate for immunotherapy in the later line setting.  -He started FOLFOX on 11/18/2022, tolerated first cycle well. 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  He did not return for pump DC on April 10, 2023 and we were not able to reach him.  So I changed his chemo treatment to CapeOx on April 22, 2023. Due to his continuous drug abuse, iv oxaliplatin was held

## 2023-05-20 ENCOUNTER — Encounter: Payer: Self-pay | Admitting: Hematology

## 2023-05-20 ENCOUNTER — Inpatient Hospital Stay: Payer: No Typology Code available for payment source

## 2023-05-20 ENCOUNTER — Inpatient Hospital Stay: Payer: No Typology Code available for payment source | Attending: Physician Assistant

## 2023-05-20 ENCOUNTER — Inpatient Hospital Stay (HOSPITAL_BASED_OUTPATIENT_CLINIC_OR_DEPARTMENT_OTHER): Payer: No Typology Code available for payment source | Admitting: Hematology

## 2023-05-20 ENCOUNTER — Other Ambulatory Visit: Payer: Self-pay

## 2023-05-20 VITALS — BP 118/68 | HR 64 | Temp 98.0°F | Resp 15 | Wt 138.3 lb

## 2023-05-20 DIAGNOSIS — C7800 Secondary malignant neoplasm of unspecified lung: Secondary | ICD-10-CM | POA: Diagnosis not present

## 2023-05-20 DIAGNOSIS — J439 Emphysema, unspecified: Secondary | ICD-10-CM | POA: Insufficient documentation

## 2023-05-20 DIAGNOSIS — Z79899 Other long term (current) drug therapy: Secondary | ICD-10-CM | POA: Insufficient documentation

## 2023-05-20 DIAGNOSIS — D500 Iron deficiency anemia secondary to blood loss (chronic): Secondary | ICD-10-CM

## 2023-05-20 DIAGNOSIS — I2510 Atherosclerotic heart disease of native coronary artery without angina pectoris: Secondary | ICD-10-CM | POA: Insufficient documentation

## 2023-05-20 DIAGNOSIS — E611 Iron deficiency: Secondary | ICD-10-CM | POA: Diagnosis not present

## 2023-05-20 DIAGNOSIS — I700 Atherosclerosis of aorta: Secondary | ICD-10-CM | POA: Diagnosis not present

## 2023-05-20 DIAGNOSIS — C787 Secondary malignant neoplasm of liver and intrahepatic bile duct: Secondary | ICD-10-CM | POA: Diagnosis not present

## 2023-05-20 DIAGNOSIS — C189 Malignant neoplasm of colon, unspecified: Secondary | ICD-10-CM

## 2023-05-20 DIAGNOSIS — Z66 Do not resuscitate: Secondary | ICD-10-CM | POA: Insufficient documentation

## 2023-05-20 DIAGNOSIS — C180 Malignant neoplasm of cecum: Secondary | ICD-10-CM | POA: Insufficient documentation

## 2023-05-20 DIAGNOSIS — Z5982 Transportation insecurity: Secondary | ICD-10-CM | POA: Insufficient documentation

## 2023-05-20 DIAGNOSIS — Z886 Allergy status to analgesic agent status: Secondary | ICD-10-CM | POA: Diagnosis not present

## 2023-05-20 LAB — CBC WITH DIFFERENTIAL (CANCER CENTER ONLY)
Abs Immature Granulocytes: 0.01 10*3/uL (ref 0.00–0.07)
Basophils Absolute: 0 10*3/uL (ref 0.0–0.1)
Basophils Relative: 1 %
Eosinophils Absolute: 0.1 10*3/uL (ref 0.0–0.5)
Eosinophils Relative: 2 %
HCT: 36.5 % — ABNORMAL LOW (ref 39.0–52.0)
Hemoglobin: 11.4 g/dL — ABNORMAL LOW (ref 13.0–17.0)
Immature Granulocytes: 0 %
Lymphocytes Relative: 23 %
Lymphs Abs: 1.3 10*3/uL (ref 0.7–4.0)
MCH: 25.3 pg — ABNORMAL LOW (ref 26.0–34.0)
MCHC: 31.2 g/dL (ref 30.0–36.0)
MCV: 80.9 fL (ref 80.0–100.0)
Monocytes Absolute: 0.4 10*3/uL (ref 0.1–1.0)
Monocytes Relative: 8 %
Neutro Abs: 3.8 10*3/uL (ref 1.7–7.7)
Neutrophils Relative %: 66 %
Platelet Count: 235 10*3/uL (ref 150–400)
RBC: 4.51 MIL/uL (ref 4.22–5.81)
RDW: 14.1 % (ref 11.5–15.5)
WBC Count: 5.7 10*3/uL (ref 4.0–10.5)
nRBC: 0 % (ref 0.0–0.2)

## 2023-05-20 LAB — CMP (CANCER CENTER ONLY)
ALT: 7 U/L (ref 0–44)
AST: 12 U/L — ABNORMAL LOW (ref 15–41)
Albumin: 3.6 g/dL (ref 3.5–5.0)
Alkaline Phosphatase: 85 U/L (ref 38–126)
Anion gap: 3 — ABNORMAL LOW (ref 5–15)
BUN: 17 mg/dL (ref 6–20)
CO2: 27 mmol/L (ref 22–32)
Calcium: 8.7 mg/dL — ABNORMAL LOW (ref 8.9–10.3)
Chloride: 110 mmol/L (ref 98–111)
Creatinine: 0.84 mg/dL (ref 0.61–1.24)
GFR, Estimated: >60 mL/min (ref >60)
Glucose, Bld: 87 mg/dL (ref 70–99)
Potassium: 4.2 mmol/L (ref 3.5–5.1)
Sodium: 140 mmol/L (ref 135–145)
Total Bilirubin: 0.3 mg/dL (ref 0.0–1.2)
Total Protein: 6.8 g/dL (ref 6.5–8.1)

## 2023-05-20 LAB — FERRITIN: Ferritin: 58 ng/mL (ref 24–336)

## 2023-05-20 MED ORDER — MIRTAZAPINE 15 MG PO TABS: 15.0000 mg | ORAL_TABLET | Freq: Every day | ORAL | 1 refills | Status: AC

## 2023-05-20 MED ORDER — SODIUM CHLORIDE 0.9% FLUSH
10.0000 mL | INTRAVENOUS | Status: DC | PRN
  Administered 2023-05-20: 10 mL via INTRAVENOUS

## 2023-05-20 NOTE — Progress Notes (Signed)
 [facility]   [phone] [phone]   Clinic Follow up Note   Patient Care Team: [name], MD as PCP - General (Hematology) Patient, No Pcp Per (General Practice) [name], MD as Consulting Physician (Hematology and Oncology) Pickenpack-Cousar, [name], NP as Nurse Practitioner [facility] and Palliative [facility])  Date of Service:  05/20/2023  CHIEF COMPLAINT: f/u of metastatic colon cancer  CURRENT THERAPY:  Chemotherapy Xeloda  Oncology History   Metastatic colon cancer to liver (HCC) -stage IV with liver and lung metastasis, MMR normal, KRAS G12A mutation(+), TMB 10.5  -Patient presented with worsening abdominal pain for the past 3 to 4 months.  His workup in the hospital revealed metastatic colon cancer to liver and lung.   -Although his biopsy of the cecal mass was negative for malignant cells, his liver biopsy confirmed adenocarcinoma and immunostain are consistent with colorectal primary.  Based on the colonoscopy findings, and severe iron deficiency, this is consistent with metastatic colon cancer -I recommend systemic therapy with FOLFOX and beva. -The goal of chemotherapy is palliative, to prolong his life and improve cancer related symptoms. -I also reviewed the NGS testing results with patient, which showed MSI stable disease, K-ras mutation positive.  He is not a candidate for EGFR inhibitor  -He is not a candidate for immunotherapy as first-line, but he does have high tumor mutation burden, and is a candidate for immunotherapy in the later line setting.  -He started FOLFOX on 11/18/2022, tolerated first cycle well. 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  He did not return for pump DC on April 10, 2023 and we were not able to reach him.  So I changed his chemo treatment to CapeOx on April 22, 2023. Due to his continuous drug abuse, iv oxaliplatin was  held   Assessment and Plan    Metastatic Colon Cancer 58 year old male with metastatic colon cancer on Xeloda. Missed last week's appointment due to transportation issues. Expresses desire to stop chemotherapy and transition to hospice care for symptom management and quality of life. Discussed incurable nature of cancer and hospice care goals.  Her expected life expectancy is less than 6 months.  -Patient agrees to stop chemotherapy and initiate hospice care. Risks and benefits of hospice care, including no life support measures, were discussed and agreed upon. - Discontinue Xeloda - Refer to [facility] - Cancel future appointments - Prescribe mirtazapine to boost appetite - Update emergency contact to [facility] - Ensure DNR status is documented  Goals of Care, DNR Patient prefers quality of life over aggressive treatment. Agrees to hospice care and no life support measures. Wishes to avoid hospital visits and focus on comfort at home. Discussed the goal of hospice care to provide comfort rather than prolong life. Patient understands and agrees to no chest compressions or mechanical ventilation if condition worsens. - Initiate hospice care with [facility] - Ensure DNR status is documented - Provide contact information for hospice services  Follow-up -refer to [facility] care - Cancel future oncology appointments - Ensure patient has contact information for hospice and social worker - Provide contact information for [place] for any questions.  -I will see him as needed         SUMMARY OF ONCOLOGIC HISTORY: Oncology History Overview Note   Cancer Staging  Metastatic colon cancer to liver [facility]) Staging form: Colon and Rectum, AJCC 8th Edition -  Clinical stage from 10/24/2022: Stage IVB (cTX, cN0, pM1b) - Signed by [name], MD on 10/31/2022 Total positive nodes: 0     Metastatic colon cancer to liver (HCC)  10/17/2022 Imaging   CT CHEST W  CONTRAST   IMPRESSION: 3 cm mass in the posterior right upper lobe with some dystrophic calcification. In addition there is an abnormal precarinal lymph node. Malignancy is possible. Recommend further workup such as PET-CT scan when clinically appropriate.   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 The previous larger bulla in the right lung apex is not seen on the current study.   Coronary artery calcifications. Please correlate for other coronary risk factors.   Aortic Atherosclerosis (ICD10-I70.0) and Emphysema (ICD10-J43.9).     10/24/2022 Cancer Staging   Staging form: Colon and Rectum, AJCC 8th Edition - Clinical stage from 10/24/2022: Stage IVB (cTX, cN0, pM1b) - Signed by [name], MD on 10/31/2022 Total positive nodes: 0   10/29/2022 Imaging   C ABDOMEN PELVIS W CONTRAST   IMPRESSION: 1. 3.3 x 3.2 x 2.6 cm rounded masslike wall abnormality in the mid transverse colon just left of the midline, suspected to be a primary tumor. This fills most of luminal volume but there is no evidence of an upstream mechanical obstruction. 2. Four metastatic liver masses are again noted, with the largest lesion in segment 8 measuring 5.1 cm, previously 4.3 cm. 3. Mesenteric edema which could be congestive, due to malnutrition or hepatic dysfunction. Some of this is seen adjacent to the pancreas. It is possible the patient could have pancreatitis but no peripancreatic fluid or ductal dilatation is seen. Correlate with serum lipase. 4. Diverticulosis and moderate fecal stasis. 5. Aortic atherosclerosis.   10/31/2022 Initial Diagnosis   Metastatic colon cancer to liver (HCC)   11/06/2022 Miscellaneous      11/18/2022 - 04/08/2023 Chemotherapy   Patient is on Treatment Plan : COLORECTAL FOLFOX + Bevacizumab q14d     05/13/2023 - 05/13/2023 Chemotherapy   Patient is on Treatment Plan : COLORECTAL CapeOx + Bevacizumab q21d        Discussed the use of AI scribe software  for clinical note transcription with the patient, who gave verbal consent to proceed.  History of Present Illness   Mr. Marvin Thomas, a 58 year old with metastatic colon cancer, presents for follow-up. He has been on oral chemotherapy (Xeloda), taking three pills in the morning and three in the evening for 14 days, followed by a week off. He reports adherence to this regimen. However, he has been unable to receive IV chemotherapy due to a positive urine test for cocaine, which he attributes to handling the drug for a friend, not personal use. He lives in a rooming house and has transportation issues, which have led to missed appointments. He has expressed a desire to stop chemotherapy and transition to hospice care. He also reports a decrease in appetite.         All other systems were reviewed with the patient and are negative.  MEDICAL HISTORY:  Past Medical History:  Diagnosis Date   Anemia Dx 2014   Gallstones    Hemothorax    Ulcer     SURGICAL HISTORY: Past Surgical History:  Procedure Laterality Date   BIOPSY  10/18/2022   Procedure: BIOPSY;  Surgeon: Kathi Der, MD;  Location: [facility];  Service: Gastroenterology;;   BIOPSY  10/24/2022   Procedure: BIOPSY;  Surgeon: Kerin Salen, MD;  Location: [facility];  Service: Gastroenterology;;  COLONOSCOPY WITH PROPOFOL N/A 10/18/2022   Procedure: COLONOSCOPY WITH PROPOFOL;  Surgeon: Kathi Der, MD;  Location: [facility];  Service: Gastroenterology;  Laterality: N/A;   COLONOSCOPY WITH PROPOFOL N/A 10/24/2022   Procedure: COLONOSCOPY WITH PROPOFOL;  Surgeon: Kerin Salen, MD;  Location: [facility];  Service: Gastroenterology;  Laterality: N/A;   ESOPHAGOGASTRODUODENOSCOPY (EGD) WITH PROPOFOL N/A 10/18/2022   Procedure: ESOPHAGOGASTRODUODENOSCOPY (EGD) WITH PROPOFOL;  Surgeon: Kathi Der, MD;  Location: [facility];  Service: Gastroenterology;  Laterality: N/A;   IR IMAGING GUIDED PORT INSERTION  11/12/2022    LAPAROTOMY N/A 12/09/2017   Procedure: EXPLORATORY LAPAROTOMY, REPAIR PERFORATED PYLEORIC ULCER;  Surgeon: Almond Lint, MD;  Location: [facility];  Service: General;  Laterality: N/A;   POLYPECTOMY  10/24/2022   Procedure: POLYPECTOMY;  Surgeon: Kerin Salen, MD;  Location: [facility];  Service: Gastroenterology;;   SUBMUCOSAL TATTOO INJECTION  10/18/2022   Procedure: SUBMUCOSAL TATTOO INJECTION;  Surgeon: Kathi Der, MD;  Location: [facility];  Service: Gastroenterology;;    I have reviewed the social history and family history with the patient and they are unchanged from previous note.  ALLERGIES:  is allergic to aspirin.  MEDICATIONS:  Current Outpatient Medications  Medication Sig Dispense Refill   mirtazapine (REMERON) 15 MG tablet Take 1 tablet (15 mg total) by mouth at bedtime. 30 tablet 1   acetaminophen (TYLENOL) 500 MG tablet Take 2 tablets (1,000 mg total) by mouth every 6 (six) hours as needed for mild pain (pain score 1-3) or headache. 30 tablet 2   BENADRYL ALLERGY 25 MG tablet Take 1-2 tablets (25-50 mg total) by mouth every 6 (six) hours as needed for sleep. 30 tablet 2   cyanocobalamin 1000 MCG tablet Take 1 tablet (1,000 mcg total) by mouth daily. 30 tablet 2   pantoprazole (PROTONIX) 40 MG tablet Take 1 tablet (40 mg total) by mouth daily. 30 tablet 3   potassium chloride SA (KLOR-CON M) 20 MEQ tablet Take 1 tablet (20 mEq total) by mouth daily. 30 tablet 0   No current facility-administered medications for this visit.    PHYSICAL EXAMINATION: ECOG PERFORMANCE STATUS: 1 - Symptomatic but completely ambulatory  Vitals:   05/20/23 1059  BP: 118/68  Pulse: 64  Resp: 15  Temp: 98 F (36.7 C)  SpO2: 99%   Wt Readings from Last 3 Encounters:  05/20/23 138 lb 4.8 oz (62.7 kg)  04/22/23 136 lb (61.7 kg)  04/08/23 138 lb (62.6 kg)     GENERAL:alert, no distress and comfortable SKIN: skin color, texture, turgor are normal, no rashes or significant  lesions EYES: normal, Conjunctiva are pink and non-injected, sclera clear NECK: supple, thyroid normal size, non-tender, without nodularity LYMPH:  no palpable lymphadenopathy in the cervical, axillary  LUNGS: clear to auscultation and percussion with normal breathing effort HEART: regular rate & rhythm and no murmurs and no lower extremity edema ABDOMEN:abdomen soft, non-tender and normal bowel sounds Musculoskeletal:no cyanosis of digits and no clubbing  NEURO: alert & oriented x 3 with fluent speech, no focal motor/sensory deficits      LABORATORY DATA:  I have reviewed the data as listed    Latest Ref Rng & Units 05/20/2023   10:36 AM 04/22/2023   10:44 AM 04/08/2023    8:38 AM  CBC  WBC 4.0 - 10.5 K/uL 5.7  5.5  6.1   Hemoglobin 13.0 - 17.0 g/dL 16.1  09.6  04.5   Hematocrit 39.0 - 52.0 % 36.5  34.0  33.8   Platelets  150 - 400 K/uL 235  199  312         Latest Ref Rng & Units 05/20/2023   10:36 AM 04/22/2023   10:44 AM 04/08/2023    8:38 AM  CMP  Glucose 70 - 99 mg/dL 87  91  161   BUN 6 - 20 mg/dL 17  23  28    Creatinine 0.61 - 1.24 mg/dL 0.96  0.45  4.09   Sodium 135 - 145 mmol/L 140  139  137   Potassium 3.5 - 5.1 mmol/L 4.2  4.0  4.0   Chloride 98 - 111 mmol/L 110  109  108   CO2 22 - 32 mmol/L 27  25  25    Calcium 8.9 - 10.3 mg/dL 8.7  8.4  8.6   Total Protein 6.5 - 8.1 g/dL 6.8  6.8  7.0   Total Bilirubin 0.0 - 1.2 mg/dL 0.3  0.3  0.3   Alkaline Phos 38 - 126 U/L 85  81  83   AST 15 - 41 U/L 12  11  11    ALT 0 - 44 U/L 7  7  6        RADIOGRAPHIC STUDIES: I have personally reviewed the radiological images as listed and agreed with the findings in the report. No results found.    No orders of the defined types were placed in this encounter.  All questions were answered. The patient knows to call the clinic with any problems, questions or concerns. No barriers to learning was detected. The total time spent in the appointment was 30 minutes.     [name],  MD 05/20/2023

## 2023-05-21 ENCOUNTER — Other Ambulatory Visit: Payer: Self-pay

## 2023-05-21 NOTE — Progress Notes (Signed)
 Patient discontinuing chemotherapy and transitioning to hospice care per chart. Disenrolled.

## 2023-06-03 ENCOUNTER — Other Ambulatory Visit: Payer: No Typology Code available for payment source

## 2023-06-03 ENCOUNTER — Ambulatory Visit: Payer: No Typology Code available for payment source | Admitting: Hematology

## 2023-06-03 ENCOUNTER — Ambulatory Visit: Payer: No Typology Code available for payment source

## 2023-06-10 ENCOUNTER — Other Ambulatory Visit: Payer: Self-pay | Admitting: Hematology and Oncology

## 2023-06-10 ENCOUNTER — Other Ambulatory Visit: Payer: No Typology Code available for payment source

## 2023-06-10 ENCOUNTER — Ambulatory Visit: Payer: No Typology Code available for payment source | Admitting: Hematology

## 2023-06-10 ENCOUNTER — Ambulatory Visit: Payer: No Typology Code available for payment source

## 2023-11-09 ENCOUNTER — Encounter (HOSPITAL_COMMUNITY): Payer: Self-pay

## 2023-11-09 ENCOUNTER — Other Ambulatory Visit: Payer: Self-pay

## 2023-11-09 ENCOUNTER — Emergency Department (HOSPITAL_COMMUNITY): Admission: EM | Admit: 2023-11-09 | Discharge: 2023-11-09 | Disposition: A | Discharge status: Home / Self Care

## 2023-11-09 ENCOUNTER — Emergency Department (HOSPITAL_COMMUNITY)

## 2023-11-09 DIAGNOSIS — J441 Chronic obstructive pulmonary disease with (acute) exacerbation: Secondary | ICD-10-CM | POA: Diagnosis not present

## 2023-11-09 DIAGNOSIS — R110 Nausea: Secondary | ICD-10-CM

## 2023-11-09 DIAGNOSIS — R0602 Shortness of breath: Secondary | ICD-10-CM | POA: Diagnosis present

## 2023-11-09 DIAGNOSIS — Z87891 Personal history of nicotine dependence: Secondary | ICD-10-CM | POA: Insufficient documentation

## 2023-11-09 LAB — CBC
HCT: 32.2 % — ABNORMAL LOW (ref 39.0–52.0)
Hemoglobin: 10.1 g/dL — ABNORMAL LOW (ref 13.0–17.0)
MCH: 24.8 pg — ABNORMAL LOW (ref 26.0–34.0)
MCHC: 31.4 g/dL (ref 30.0–36.0)
MCV: 79.1 fL — ABNORMAL LOW (ref 80.0–100.0)
Platelets: 274 K/uL (ref 150–400)
RBC: 4.07 MIL/uL — ABNORMAL LOW (ref 4.22–5.81)
RDW: 13.2 % (ref 11.5–15.5)
WBC: 7.8 K/uL (ref 4.0–10.5)
nRBC: 0 % (ref 0.0–0.2)

## 2023-11-09 LAB — COMPREHENSIVE METABOLIC PANEL WITH GFR
ALT: 34 U/L (ref 0–44)
AST: 31 U/L (ref 15–41)
Albumin: 3 g/dL — ABNORMAL LOW (ref 3.5–5.0)
Alkaline Phosphatase: 167 U/L — ABNORMAL HIGH (ref 38–126)
Anion gap: 12 (ref 5–15)
BUN: 17 mg/dL (ref 6–20)
CO2: 19 mmol/L — ABNORMAL LOW (ref 22–32)
Calcium: 8.8 mg/dL — ABNORMAL LOW (ref 8.9–10.3)
Chloride: 105 mmol/L (ref 98–111)
Creatinine, Ser: 0.97 mg/dL (ref 0.61–1.24)
GFR, Estimated: >60 mL/min (ref >60)
Glucose, Bld: 124 mg/dL — ABNORMAL HIGH (ref 70–99)
Potassium: 4 mmol/L (ref 3.5–5.1)
Sodium: 136 mmol/L (ref 135–145)
Total Bilirubin: 0.5 mg/dL (ref 0.0–1.2)
Total Protein: 7.2 g/dL (ref 6.5–8.1)

## 2023-11-09 LAB — URINALYSIS, ROUTINE W REFLEX MICROSCOPIC
Bilirubin Urine: NEGATIVE
Glucose, UA: NEGATIVE mg/dL
Hgb urine dipstick: NEGATIVE
Ketones, ur: NEGATIVE mg/dL
Leukocytes,Ua: NEGATIVE
Nitrite: NEGATIVE
Protein, ur: 100 mg/dL — AB
Specific Gravity, Urine: 1.027 (ref 1.005–1.030)
pH: 5 (ref 5.0–8.0)

## 2023-11-09 LAB — CBG MONITORING, ED: Glucose-Capillary: 120 mg/dL — ABNORMAL HIGH (ref 70–99)

## 2023-11-09 MED ORDER — ONDANSETRON HCL 4 MG PO TABS: 4.0000 mg | ORAL_TABLET | Freq: Three times a day (TID) | ORAL | 0 refills | Status: AC | PRN

## 2023-11-09 MED ORDER — ALBUTEROL SULFATE HFA 108 (90 BASE) MCG/ACT IN AERS: 1.0000 | INHALATION_SPRAY | Freq: Four times a day (QID) | RESPIRATORY_TRACT | 0 refills | Status: AC | PRN

## 2023-11-09 MED ORDER — PREDNISONE 20 MG PO TABS
60.0000 mg | ORAL_TABLET | Freq: Once | ORAL | Status: AC
  Administered 2023-11-09: 60 mg via ORAL
  Filled 2023-11-09: qty 3

## 2023-11-09 MED ORDER — ONDANSETRON 4 MG PO TBDP
4.0000 mg | ORAL_TABLET | Freq: Once | ORAL | Status: AC
  Administered 2023-11-09: 4 mg via ORAL
  Filled 2023-11-09: qty 1

## 2023-11-09 MED ORDER — PREDNISONE 10 MG PO TABS: 20.0000 mg | ORAL_TABLET | Freq: Every day | ORAL | 0 refills | Status: DC

## 2023-11-09 MED ORDER — PREDNISONE 10 MG PO TABS: 40.0000 mg | ORAL_TABLET | Freq: Every day | ORAL | 0 refills | Status: AC

## 2023-11-09 NOTE — ED Triage Notes (Signed)
 Patient in ED today with complaints of being tired and nauseous for the past 3 or 4 days. He is also complaining of shortness of breath. Patient reports he has cancer and stopped going to treatmen. He reports that it is in both lungs and his liver.

## 2023-11-09 NOTE — ED Notes (Signed)
 Pt unable to urinate at this time, provided with urine cup

## 2023-11-09 NOTE — ED Provider Notes (Signed)
  EMERGENCY DEPARTMENT AT [facility] Provider Note   CSN: [id number] Arrival date & time: 11/09/23  1550     Patient presents with: Weakness and Nausea   Marvin Thomas is a 58 y.o. male.   58 year old male presents for evaluation of lightheadedness and nausea and some shortness of breath.  He uses albuterol  as needed and has a history of smoking.  States he has been more fatigued than usual.  States has been working out in the hot sun and not drinking as much.  Denies any other symptoms or concerns.   Weakness Associated symptoms: shortness of breath   Associated symptoms: no abdominal pain, no arthralgias, no chest pain, no cough, no dysuria, no fever, no seizures and no vomiting        Prior to Admission medications   Medication Sig Start Date End Date Taking? Authorizing Provider  albuterol  (VENTOLIN  HFA) 108 (90 Base) MCG/ACT inhaler Inhale 1-2 puffs into the lungs every 6 (six) hours as needed for wheezing or shortness of breath. 11/09/23  Yes Odin Mariani L, DO  ondansetron  (ZOFRAN ) 4 MG tablet Take 1 tablet (4 mg total) by mouth every 8 (eight) hours as needed for up to 4 days. 11/09/23 11/13/23 Yes Kennard Fildes L, DO  acetaminophen  (TYLENOL ) 500 MG tablet Take 2 tablets (1,000 mg total) by mouth every 6 (six) hours as needed for mild pain (pain score 1-3) or headache. 01/13/23   Burton, Lacie K, NP  BENADRYL  ALLERGY  25 MG tablet Take 1-2 tablets (25-50 mg total) by mouth every 6 (six) hours as needed for sleep. 01/13/23   Burton, Lacie K, NP  cyanocobalamin  1000 MCG tablet Take 1 tablet (1,000 mcg total) by mouth daily. 01/13/23   Burton, Lacie K, NP  mirtazapine  (REMERON ) 15 MG tablet Take 1 tablet (15 mg total) by mouth at bedtime. 05/20/23   Lanny Callander, MD  pantoprazole  (PROTONIX ) 40 MG tablet Take 1 tablet (40 mg total) by mouth daily. 01/13/23   Burton, Lacie K, NP  potassium chloride  SA (KLOR-CON  M) 20 MEQ tablet Take 1 tablet (20 mEq total) by  mouth daily. 01/30/23   Lanny Callander, MD  predniSONE  (DELTASONE ) 10 MG tablet Take 4 tablets (40 mg total) by mouth daily for 4 days. 11/09/23 11/13/23  Gennaro Bouchard L, DO    Allergies: Aspirin    Review of Systems  Constitutional:  Negative for chills and fever.  HENT:  Negative for ear pain and sore throat.   Eyes:  Negative for pain and visual disturbance.  Respiratory:  Positive for shortness of breath. Negative for cough.   Cardiovascular:  Negative for chest pain and palpitations.  Gastrointestinal:  Negative for abdominal pain and vomiting.  Genitourinary:  Negative for dysuria and hematuria.  Musculoskeletal:  Negative for arthralgias and back pain.  Skin:  Negative for color change and rash.  Neurological:  Positive for weakness. Negative for seizures and syncope.  All other systems reviewed and are negative.   Updated Vital Signs BP 124/71   Pulse 71   Temp 98 F (36.7 C)   Resp 18   Ht 5' 9 (1.753 m)   Wt 71.7 kg   SpO2 98%   BMI 23.33 kg/m   Physical Exam Vitals and nursing note reviewed.  Constitutional:      General: He is not in acute distress.    Appearance: Normal appearance. He is well-developed. He is not ill-appearing.  HENT:     Head: Normocephalic and  atraumatic.  Eyes:     Conjunctiva/sclera: Conjunctivae normal.  Cardiovascular:     Rate and Rhythm: Normal rate and regular rhythm.     Heart sounds: No murmur heard. Pulmonary:     Effort: Pulmonary effort is normal. No respiratory distress.     Breath sounds: Normal breath sounds.  Abdominal:     Palpations: Abdomen is soft.     Tenderness: There is no abdominal tenderness.  Musculoskeletal:        General: No swelling.     Cervical back: Neck supple.  Skin:    General: Skin is warm and dry.     Capillary Refill: Capillary refill takes less than 2 seconds.  Neurological:     General: No focal deficit present.     Mental Status: He is alert.  Psychiatric:        Mood and Affect: Mood  normal.     (all labs ordered are listed, but only abnormal results are displayed) Labs Reviewed  COMPREHENSIVE METABOLIC PANEL WITH GFR - Abnormal; Notable for the following components:      Result Value   CO2 19 (*)    Glucose, Bld 124 (*)    Calcium  8.8 (*)    Albumin  3.0 (*)    Alkaline Phosphatase 167 (*)    All other components within normal limits  CBC - Abnormal; Notable for the following components:   RBC 4.07 (*)    Hemoglobin 10.1 (*)    HCT 32.2 (*)    MCV 79.1 (*)    MCH 24.8 (*)    All other components within normal limits  URINALYSIS, ROUTINE W REFLEX MICROSCOPIC - Abnormal; Notable for the following components:   Color, Urine AMBER (*)    APPearance HAZY (*)    Protein, ur 100 (*)    Bacteria, UA RARE (*)    All other components within normal limits  CBG MONITORING, ED - Abnormal; Notable for the following components:   Glucose-Capillary 120 (*)    All other components within normal limits    EKG: EKG Interpretation Date/Time:  Monday November 09 2023 16:10:32 EDT Ventricular Rate:  64 PR Interval:  148 QRS Duration:  98 QT Interval:  418 QTC Calculation: 431 R Axis:   75  Text Interpretation: Normal sinus rhythm Minimal voltage criteria for LVH, may be normal variant ( Cornell product )  Compared with prior EKG from 10/16/2022 Confirmed by Gennaro Bouchard ([id number]) on 11/09/2023 6:27:16 PM  Radiology: [facility] Chest 1 View Result Date: 11/09/2023 CLINICAL DATA:  Weakness, nausea, shortness of breath, cough EXAM: CHEST  1 VIEW COMPARISON:  CT 05/07/2023 and radiograph 09/07/2021 FINDINGS: Right chest wall Port-A-Cath tip in the low SVC. Hyperinflation. No focal consolidation, pleural effusion, or pneumothorax. Question 9 mm nodule in the right mid to upper lung. No displaced rib fractures. Normal cardiomediastinal silhouette. IMPRESSION: 1. No acute cardiopulmonary disease. 2. Question 9 mm nodule in the right mid to upper lung. Recommend further evaluation with  nonemergent CT chest. Electronically Signed   By: Norman Gatlin M.D.   On: 11/09/2023 19:25     Procedures   Medications Ordered in the ED  ondansetron  (ZOFRAN -ODT) disintegrating tablet 4 mg (4 mg Oral Given 11/09/23 1914)  predniSONE  (DELTASONE ) tablet 60 mg (60 mg Oral Given 11/09/23 1914)  Medical Decision Making Social determinants of health: Tobacco dependence  Cardiac monitor interpretation: Sinus rhythm, no ectopy  Patient here for generalized not feeling well also has a history of smoking and uses bit butyryl as needed.  I think he likely has a COPD exacerbation as he has had more coughing and some mild wheezing.  Will refill his albuterol  I gave him steroids here we will start him on steroids for a few days.  Was given some Zofran  for nausea as well.  Labs fairly unremarkable and vitals are stable.  Will give him prescription for Zofran  as well.  Advised to increase his fluid intake and follow-up with primary care doctor as needed otherwise return to the ER for any worsening symptoms.  He feels comfortable to plan be discharged home.  Problems Addressed: COPD exacerbation (HCC): acute illness or injury Nausea: acute illness or injury  Amount and/or Complexity of Data Reviewed External Data Reviewed: notes.    Details: Outpatient records reviewed and patient follows up with oncology for cancer Labs: ordered. Decision-making details documented in ED Course.    Details: Labs ordered and reviewed by me and unremarkable Radiology: ordered and independent interpretation performed. Decision-making details documented in ED Course.    Details: Ordered and interpreted by me independently of radiology Chest x-ray: Shows no acute abnormality in the chest ECG/medicine tests: ordered and independent interpretation performed. Decision-making details documented in ED Course.    Details: Ordered and interpreted by me in the absence of cardiology and shows  sinus rhythm, no STEMI, no acute change when compared to prior EKG  Risk OTC drugs. Prescription drug management. Diagnosis or treatment significantly limited by social determinants of health.    Final diagnoses:  COPD exacerbation (HCC)  Nausea    ED Discharge Orders          Ordered    ondansetron  (ZOFRAN ) 4 MG tablet  Every 8 hours PRN        11/09/23 1930    predniSONE  (DELTASONE ) 10 MG tablet  Daily,   Status:  Discontinued        11/09/23 1930    albuterol  (VENTOLIN  HFA) 108 (90 Base) MCG/ACT inhaler  Every 6 hours PRN        11/09/23 1930    predniSONE  (DELTASONE ) 10 MG tablet  Daily        11/09/23 1930               Gennaro Duwaine CROME, DO 11/09/23 2328

## 2023-11-09 NOTE — Discharge Instructions (Addendum)
 Take your steroids as prescribed and follow-up with your primary care doctor.  You had a small nodule on your chest x-ray that needs an outpatient CT scan done.  Your primary care doctor can order this.  Take your inhaler as needed for shortness of breath and Zofran  as needed for nausea and vomiting.  Make sure you are drinking lots of fluids especially if you are working outside.

## 2023-12-21 ENCOUNTER — Encounter: Payer: Self-pay | Admitting: Hematology

## 2024-02-22 ENCOUNTER — Other Ambulatory Visit: Payer: Self-pay

## 2024-02-22 ENCOUNTER — Encounter (HOSPITAL_COMMUNITY): Payer: Self-pay | Admitting: Emergency Medicine

## 2024-02-22 ENCOUNTER — Telehealth: Payer: Self-pay

## 2024-02-22 ENCOUNTER — Inpatient Hospital Stay: Attending: Hematology | Admitting: Hematology

## 2024-02-22 ENCOUNTER — Emergency Department (HOSPITAL_COMMUNITY)
Admission: EM | Admit: 2024-02-22 | Discharge: 2024-02-23 | Disposition: A | Attending: Emergency Medicine | Admitting: Emergency Medicine

## 2024-02-22 DIAGNOSIS — R160 Hepatomegaly, not elsewhere classified: Secondary | ICD-10-CM | POA: Diagnosis not present

## 2024-02-22 DIAGNOSIS — D75839 Thrombocytosis, unspecified: Secondary | ICD-10-CM | POA: Diagnosis not present

## 2024-02-22 DIAGNOSIS — E871 Hypo-osmolality and hyponatremia: Secondary | ICD-10-CM | POA: Diagnosis not present

## 2024-02-22 DIAGNOSIS — C189 Malignant neoplasm of colon, unspecified: Secondary | ICD-10-CM | POA: Insufficient documentation

## 2024-02-22 DIAGNOSIS — I700 Atherosclerosis of aorta: Secondary | ICD-10-CM | POA: Diagnosis not present

## 2024-02-22 DIAGNOSIS — K5900 Constipation, unspecified: Secondary | ICD-10-CM | POA: Insufficient documentation

## 2024-02-22 DIAGNOSIS — R1084 Generalized abdominal pain: Secondary | ICD-10-CM | POA: Diagnosis present

## 2024-02-22 DIAGNOSIS — Z79899 Other long term (current) drug therapy: Secondary | ICD-10-CM | POA: Diagnosis not present

## 2024-02-22 DIAGNOSIS — C7800 Secondary malignant neoplasm of unspecified lung: Secondary | ICD-10-CM | POA: Diagnosis not present

## 2024-02-22 DIAGNOSIS — G8929 Other chronic pain: Secondary | ICD-10-CM | POA: Insufficient documentation

## 2024-02-22 DIAGNOSIS — D649 Anemia, unspecified: Secondary | ICD-10-CM | POA: Diagnosis not present

## 2024-02-22 DIAGNOSIS — I2510 Atherosclerotic heart disease of native coronary artery without angina pectoris: Secondary | ICD-10-CM | POA: Diagnosis not present

## 2024-02-22 DIAGNOSIS — C787 Secondary malignant neoplasm of liver and intrahepatic bile duct: Secondary | ICD-10-CM | POA: Diagnosis not present

## 2024-02-22 DIAGNOSIS — F1410 Cocaine abuse, uncomplicated: Secondary | ICD-10-CM | POA: Insufficient documentation

## 2024-02-22 DIAGNOSIS — R112 Nausea with vomiting, unspecified: Secondary | ICD-10-CM | POA: Diagnosis not present

## 2024-02-22 DIAGNOSIS — Z886 Allergy status to analgesic agent status: Secondary | ICD-10-CM | POA: Diagnosis not present

## 2024-02-22 DIAGNOSIS — K2970 Gastritis, unspecified, without bleeding: Secondary | ICD-10-CM | POA: Insufficient documentation

## 2024-02-22 LAB — CBC
HCT: 29.4 % — ABNORMAL LOW (ref 39.0–52.0)
Hemoglobin: 9.2 g/dL — ABNORMAL LOW (ref 13.0–17.0)
MCH: 23.5 pg — ABNORMAL LOW (ref 26.0–34.0)
MCHC: 31.3 g/dL (ref 30.0–36.0)
MCV: 75.2 fL — ABNORMAL LOW (ref 80.0–100.0)
Platelets: 431 K/uL — ABNORMAL HIGH (ref 150–400)
RBC: 3.91 MIL/uL — ABNORMAL LOW (ref 4.22–5.81)
RDW: 15.4 % (ref 11.5–15.5)
WBC: 6.4 K/uL (ref 4.0–10.5)
nRBC: 0 % (ref 0.0–0.2)

## 2024-02-22 LAB — COMPREHENSIVE METABOLIC PANEL WITH GFR
ALT: 14 U/L (ref 0–44)
AST: 73 U/L — ABNORMAL HIGH (ref 15–41)
Albumin: 3.1 g/dL — ABNORMAL LOW (ref 3.5–5.0)
Alkaline Phosphatase: 368 U/L — ABNORMAL HIGH (ref 38–126)
Anion gap: 9 (ref 5–15)
BUN: 24 mg/dL — ABNORMAL HIGH (ref 6–20)
CO2: 23 mmol/L (ref 22–32)
Calcium: 8.8 mg/dL — ABNORMAL LOW (ref 8.9–10.3)
Chloride: 103 mmol/L (ref 98–111)
Creatinine, Ser: 1.07 mg/dL (ref 0.61–1.24)
GFR, Estimated: >60 mL/min (ref >60)
Glucose, Bld: 81 mg/dL (ref 70–99)
Potassium: 4.6 mmol/L (ref 3.5–5.1)
Sodium: 134 mmol/L — ABNORMAL LOW (ref 135–145)
Total Bilirubin: 0.4 mg/dL (ref 0.0–1.2)
Total Protein: 7.4 g/dL (ref 6.5–8.1)

## 2024-02-22 MED ORDER — PANTOPRAZOLE SODIUM 40 MG PO TBEC: 40.0000 mg | DELAYED_RELEASE_TABLET | Freq: Every day | ORAL | 0 refills | Status: DC

## 2024-02-22 MED ORDER — ONDANSETRON HCL 8 MG PO TABS: 8.0000 mg | ORAL_TABLET | Freq: Three times a day (TID) | ORAL | 0 refills | Status: AC | PRN

## 2024-02-22 NOTE — Telephone Encounter (Signed)
 Pt called requesting to speak with Dr Lanny regarding his medications.  Reviewed pt's chart and pt is currently on Hospice.  Stated Dr Lanny is currently seeing pts in clinic but this nurse will schedule a telephone visit with Dr. Lanny so they can further discuss his medications.  Pt agreed.  Appt scheduled.

## 2024-02-22 NOTE — Progress Notes (Signed)
 [facility]   Telephone:(336) [phone] [phone]   Clinic Follow up Note   Patient Care Team: Lanny Callander, MD as PCP - General (Hematology) Patient, No Pcp Per (General Practice) Lanny Callander, MD as Consulting Physician (Hematology and Oncology) Pickenpack-Cousar, Fannie SAILOR, NP as Nurse Practitioner (Hospice and Palliative Medicine) 02/22/2024  I connected with Marvin Thomas on 02/22/24 at  4:00 PM EST by telephone and verified that I am speaking with the correct person using two identifiers.   I discussed the limitations, risks, security and privacy concerns of performing an evaluation and management service by telephone and the availability of in person appointments. I also discussed with the patient that there may be a patient responsible charge related to this service. The patient expressed understanding and agreed to proceed.   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  His workup in the hospital revealed metastatic colon cancer to liver and lung.   -Although his biopsy of the cecal mass was negative for malignant cells, his liver biopsy confirmed adenocarcinoma and immunostain are consistent with colorectal primary.  Based on the colonoscopy findings, and severe iron  deficiency, this is consistent with metastatic colon cancer -I recommend systemic therapy with FOLFOX and beva. -The goal of chemotherapy is palliative, to prolong his life and improve cancer related symptoms. -I also reviewed the NGS testing results with patient, which showed MSI stable disease, K-ras mutation positive.  He is not a candidate for EGFR inhibitor  -He is not  a candidate for immunotherapy as first-line, but he does have high tumor mutation burden, and is a candidate for immunotherapy in the later line setting.  -He started FOLFOX on 11/18/2022, tolerated first cycle well. 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  He did not return for pump DC on April 10, 2023 and we were not able to reach him.  So I changed his chemo treatment to CapeOx on April 22, 2023. Due to his continuous drug abuse, iv oxaliplatin  was held -due to his worsening fatigue, patient wanted stopping chemo.  He was referred to home hospice service in February 2025.   Assessment & Plan Metastatic colon cancer - He was referred to home hospice care in February 2025, but per patient states he has not received any hospice care due to some personal issue.  He is ready to have hospice home care service now.   -Chronic abdominal pain and nausea, likely related to metastatic colon cancer. No recent hospice care engagement due to housing instability. Currently residing in a rooming house with a permanent address. Able to care for himself most of the time. -will refer again to hospice service to initiate services at his new address. - Prescribed Zofran  for nausea. - Prescribed Protonix  for stomach discomfort. -will defer to hospice for pain management. -f/u with me as needed      SUMMARY OF ONCOLOGIC HISTORY: Oncology History Overview Note   Cancer Staging  Metastatic colon cancer to liver [facility] Staging form: Colon and Rectum, AJCC 8th Edition - Clinical stage from 10/24/2022: Stage IVB (cTX, cN0,  pM1b) - Signed by Lanny Callander, MD on 10/31/2022 Total positive nodes: 0     Metastatic colon cancer to liver (HCC)  10/17/2022 Imaging   CT CHEST W CONTRAST   IMPRESSION: 3 cm mass in the posterior right upper lobe with some dystrophic calcification. In addition there is an abnormal precarinal lymph node.  Malignancy is possible. Recommend further workup such as PET-CT scan when clinically appropriate.   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 The previous larger bulla in the right lung apex is not seen on the current study.   Coronary artery calcifications. Please correlate for other coronary risk factors.   Aortic Atherosclerosis (ICD10-I70.0) and Emphysema (ICD10-J43.9).     10/24/2022 Cancer Staging   Staging form: Colon and Rectum, AJCC 8th Edition - Clinical stage from 10/24/2022: Stage IVB (cTX, cN0, pM1b) - Signed by Lanny Callander, MD on 10/31/2022 Total positive nodes: 0   10/29/2022 Imaging   C ABDOMEN PELVIS W CONTRAST   IMPRESSION: 1. 3.3 x 3.2 x 2.6 cm rounded masslike wall abnormality in the mid transverse colon just left of the midline, suspected to be a primary tumor. This fills most of luminal volume but there is no evidence of an upstream mechanical obstruction. 2. Four metastatic liver masses are again noted, with the largest lesion in segment 8 measuring 5.1 cm, previously 4.3 cm. 3. Mesenteric edema which could be congestive, due to malnutrition or hepatic dysfunction. Some of this is seen adjacent to the pancreas. It is possible the patient could have pancreatitis but no peripancreatic fluid or ductal dilatation is seen. Correlate with serum lipase. 4. Diverticulosis and moderate fecal stasis. 5. Aortic atherosclerosis.   10/31/2022 Initial Diagnosis   Metastatic colon cancer to liver (HCC)   11/06/2022 Miscellaneous      11/18/2022 - 04/08/2023 Chemotherapy   Patient is on Treatment Plan : COLORECTAL FOLFOX + Bevacizumab  q14d     05/13/2023 - 05/13/2023 Chemotherapy   Patient is on Treatment Plan : COLORECTAL CapeOx + Bevacizumab  q21d       Discussed the use of AI scribe software for clinical note transcription with the patient, who gave verbal consent to proceed.  History of Present Illness Marvin Thomas is a 58 year old male with  metastatic colon cancer who presents with a request for medication refills and hospice care coordination.  He had a phone visit to address medication refills and lack of contact from hospice despite a February referral.  He has persistent abdominal pain and nausea with near-daily morning vomiting. His stomach hurts most of the time. He uses Zofran  for nausea and Protonix  for gastric symptoms and needs pain medication for abdominal pain.  He recently moved to a rooming house at [employer], [address], in [place]. He mostly cares for himself, is often alone, and is able to eat and ambulate. He had constipation after eating a large amount of cheese.     REVIEW OF SYSTEMS:   Constitutional: Denies fevers, chills or abnormal weight loss Eyes: Denies blurriness of vision Ears, nose, mouth, throat, and face: Denies mucositis or sore throat Respiratory: Denies cough, dyspnea or wheezes Cardiovascular: Denies palpitation, chest discomfort or lower extremity swelling Gastrointestinal:  Denies nausea, heartburn or change in bowel habits Skin: Denies abnormal skin rashes Lymphatics: Denies new lymphadenopathy or easy bruising Neurological:Denies numbness, tingling or new weaknesses Behavioral/Psych: Mood is stable, no new changes  All other systems were reviewed with the patient and are negative.  MEDICAL HISTORY:  Past  Medical History:  Diagnosis Date   Anemia Dx 2014   Gallstones    Hemothorax    Ulcer     SURGICAL HISTORY: Past Surgical History:  Procedure Laterality Date   BIOPSY  10/18/2022   Procedure: BIOPSY;  Surgeon: Elicia Claw, MD;  Location: [facility];  Service: Gastroenterology;;   BIOPSY  10/24/2022   Procedure: BIOPSY;  Surgeon: Saintclair Jasper, MD;  Location: [facility];  Service: Gastroenterology;;   COLONOSCOPY WITH PROPOFOL  N/A 10/18/2022   Procedure: COLONOSCOPY WITH PROPOFOL ;  Surgeon: Elicia Claw, MD;  Location: [facility];  Service:  Gastroenterology;  Laterality: N/A;   COLONOSCOPY WITH PROPOFOL  N/A 10/24/2022   Procedure: COLONOSCOPY WITH PROPOFOL ;  Surgeon: Saintclair Jasper, MD;  Location: [facility];  Service: Gastroenterology;  Laterality: N/A;   ESOPHAGOGASTRODUODENOSCOPY (EGD) WITH PROPOFOL  N/A 10/18/2022   Procedure: ESOPHAGOGASTRODUODENOSCOPY (EGD) WITH PROPOFOL ;  Surgeon: Elicia Claw, MD;  Location: [facility];  Service: Gastroenterology;  Laterality: N/A;   IR IMAGING GUIDED PORT INSERTION  11/12/2022   LAPAROTOMY N/A 12/09/2017   Procedure: EXPLORATORY LAPAROTOMY, REPAIR PERFORATED PYLEORIC ULCER;  Surgeon: Aron Shoulders, MD;  Location: [facility];  Service: General;  Laterality: N/A;   POLYPECTOMY  10/24/2022   Procedure: POLYPECTOMY;  Surgeon: Saintclair Jasper, MD;  Location: [facility];  Service: Gastroenterology;;   SUBMUCOSAL TATTOO INJECTION  10/18/2022   Procedure: SUBMUCOSAL TATTOO INJECTION;  Surgeon: Elicia Claw, MD;  Location: [facility];  Service: Gastroenterology;;    I have reviewed the social history and family history with the patient and they are unchanged from previous note.  ALLERGIES:  is allergic to aspirin.  MEDICATIONS:  Current Outpatient Medications  Medication Sig Dispense Refill   ondansetron  (ZOFRAN ) 8 MG tablet Take 1 tablet (8 mg total) by mouth every 8 (eight) hours as needed for nausea or vomiting. 30 tablet 0   acetaminophen  (TYLENOL ) 500 MG tablet Take 2 tablets (1,000 mg total) by mouth every 6 (six) hours as needed for mild pain (pain score 1-3) or headache. 30 tablet 2   albuterol  (VENTOLIN  HFA) 108 (90 Base) MCG/ACT inhaler Inhale 1-2 puffs into the lungs every 6 (six) hours as needed for wheezing or shortness of breath. 6.7 g 0   BENADRYL  ALLERGY  25 MG tablet Take 1-2 tablets (25-50 mg total) by mouth every 6 (six) hours as needed for sleep. 30 tablet 2   cyanocobalamin  1000 MCG tablet Take 1 tablet (1,000 mcg total) by mouth daily. 30 tablet 2   mirtazapine  (REMERON ) 15  MG tablet Take 1 tablet (15 mg total) by mouth at bedtime. 30 tablet 1   pantoprazole  (PROTONIX ) 40 MG tablet Take 1 tablet (40 mg total) by mouth daily. 30 tablet 0   potassium chloride  SA (KLOR-CON  M) 20 MEQ tablet Take 1 tablet (20 mEq total) by mouth daily. 30 tablet 0   No current facility-administered medications for this visit.    PHYSICAL EXAMINATION: Not performed   LABORATORY DATA:  I have reviewed the data as listed    Latest Ref Rng & Units 11/09/2023    4:01 PM 05/20/2023   10:36 AM 04/22/2023   10:44 AM  CBC  WBC 4.0 - 10.5 K/uL 7.8  5.7  5.5   Hemoglobin 13.0 - 17.0 g/dL 89.8  88.5  89.2   Hematocrit 39.0 - 52.0 % 32.2  36.5  34.0   Platelets 150 - 400 K/uL 274  235  199         Latest Ref Rng & Units 11/09/2023  4:01 PM 05/20/2023   10:36 AM 04/22/2023   10:44 AM  CMP  Glucose 70 - 99 mg/dL 875  87  91   BUN 6 - 20 mg/dL 17  17  23    Creatinine 0.61 - 1.24 mg/dL 9.02  9.15  9.21   Sodium 135 - 145 mmol/L 136  140  139   Potassium 3.5 - 5.1 mmol/L 4.0  4.2  4.0   Chloride 98 - 111 mmol/L 105  110  109   CO2 22 - 32 mmol/L 19  27  25    Calcium  8.9 - 10.3 mg/dL 8.8  8.7  8.4   Total Protein 6.5 - 8.1 g/dL 7.2  6.8  6.8   Total Bilirubin 0.0 - 1.2 mg/dL 0.5  0.3  0.3   Alkaline Phos 38 - 126 U/L 167  85  81   AST 15 - 41 U/L 31  12  11    ALT 0 - 44 U/L 34  7  7       RADIOGRAPHIC STUDIES: I have personally reviewed the radiological images as listed and agreed with the findings in the report. No results found.     I discussed the assessment and treatment plan with the patient. The patient was provided an opportunity to ask questions and all were answered. The patient agreed with the plan and demonstrated an understanding of the instructions.   The patient was advised to call back or seek an in-person evaluation if the symptoms worsen or if the condition fails to improve as anticipated.  I provided 15 minutes of non face-to-face telephone visit time  during this encounter, including review of chart and various tests results, discussions about plan of care and coordination of care plan.    Onita Mattock, MD 02/22/24

## 2024-02-22 NOTE — ED Triage Notes (Signed)
 Patient c/o Generalized abdominal pain 3 days. Patient report nausea denies vomiting and diarrhea. Patient denies chest pain and SOB.

## 2024-02-22 NOTE — Assessment & Plan Note (Signed)
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  His workup in the hospital revealed metastatic colon cancer to liver and lung.   -Although his biopsy of the cecal mass was negative for malignant cells, his liver biopsy confirmed adenocarcinoma and immunostain are consistent with colorectal primary.  Based on the colonoscopy findings, and severe iron  deficiency, this is consistent with metastatic colon cancer -I recommend systemic therapy with FOLFOX and beva. -The goal of chemotherapy is palliative, to prolong his life and improve cancer related symptoms. -I also reviewed the NGS testing results with patient, which showed MSI stable disease, K-ras mutation positive.  He is not a candidate for EGFR inhibitor  -He is not a candidate for immunotherapy as first-line, but he does have high tumor mutation burden, and is a candidate for immunotherapy in the later line setting.  -He started FOLFOX on 11/18/2022, tolerated first cycle well. 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  He did not return for pump DC on April 10, 2023 and we were not able to reach him.  So I changed his chemo treatment to CapeOx on April 22, 2023. Due to his continuous drug abuse, iv oxaliplatin  was held -due to his worsening fatigue, patient wanted stopping chemo.  He was referred to home hospice service in February 2025.

## 2024-02-23 ENCOUNTER — Encounter (HOSPITAL_COMMUNITY): Payer: Self-pay

## 2024-02-23 ENCOUNTER — Emergency Department (HOSPITAL_COMMUNITY)

## 2024-02-23 LAB — URINALYSIS, ROUTINE W REFLEX MICROSCOPIC
Bacteria, UA: NONE SEEN
Bilirubin Urine: NEGATIVE
Glucose, UA: NEGATIVE mg/dL
Hgb urine dipstick: NEGATIVE
Ketones, ur: NEGATIVE mg/dL
Leukocytes,Ua: NEGATIVE
Nitrite: NEGATIVE
Protein, ur: 30 mg/dL — AB
Specific Gravity, Urine: 1.026 (ref 1.005–1.030)
pH: 5 (ref 5.0–8.0)

## 2024-02-23 MED ORDER — PANTOPRAZOLE SODIUM 40 MG PO TBEC: 40.0000 mg | DELAYED_RELEASE_TABLET | Freq: Every day | ORAL | 0 refills | Status: DC

## 2024-02-23 MED ORDER — IOHEXOL 300 MG/ML  SOLN
100.0000 mL | Freq: Once | INTRAMUSCULAR | Status: AC | PRN
  Administered 2024-02-23: 100 mL via INTRAVENOUS

## 2024-02-23 MED ORDER — ALUM & MAG HYDROXIDE-SIMETH 200-200-20 MG/5ML PO SUSP
30.0000 mL | Freq: Once | ORAL | Status: AC
  Administered 2024-02-23: 30 mL via ORAL
  Filled 2024-02-23: qty 30

## 2024-02-23 MED ORDER — KETOROLAC TROMETHAMINE 30 MG/ML IJ SOLN
15.0000 mg | Freq: Once | INTRAMUSCULAR | Status: AC
  Administered 2024-02-23: 15 mg via INTRAVENOUS
  Filled 2024-02-23: qty 1

## 2024-02-23 NOTE — ED Provider Notes (Signed)
 [place] EMERGENCY DEPARTMENT AT [facility] Provider Note   CSN: [id number] Arrival date & time: 02/22/24  2243     Patient presents with: Abdominal Pain   Marvin Thomas is a 58 y.o. male.   The history is provided by the patient.  Abdominal Pain Pain location:  Generalized Pain radiates to:  Does not radiate Pain severity:  Moderate Onset quality:  Sudden Timing:  Constant Progression:  Unchanged Chronicity:  New Context: eating   Context comment:  And constipation Relieved by:  Nothing Worsened by:  Nothing Ineffective treatments:  None tried Associated symptoms: no anorexia, no constipation, no fever, no nausea, no sore throat, no vaginal bleeding, no vaginal discharge and no vomiting   Risk factors: not pregnant   Patient with colon cancer presents with constipation and generalized abdominal pain x 3 day.  Ate greens and 4 peanut butter sandwiches.      Past Medical History:  Diagnosis Date   Anemia Dx 2014   Gallstones    Hemothorax    Ulcer      Prior to Admission medications   Medication Sig Start Date End Date Taking? Authorizing Provider  acetaminophen  (TYLENOL ) 500 MG tablet Take 2 tablets (1,000 mg total) by mouth every 6 (six) hours as needed for mild pain (pain score 1-3) or headache. 01/13/23   Burton, Lacie K, NP  albuterol  (VENTOLIN  HFA) 108 (90 Base) MCG/ACT inhaler Inhale 1-2 puffs into the lungs every 6 (six) hours as needed for wheezing or shortness of breath. 11/09/23   Kammerer, Megan L, DO  BENADRYL  ALLERGY  25 MG tablet Take 1-2 tablets (25-50 mg total) by mouth every 6 (six) hours as needed for sleep. 01/13/23   Burton, Lacie K, NP  cyanocobalamin  1000 MCG tablet Take 1 tablet (1,000 mcg total) by mouth daily. 01/13/23   Burton, Lacie K, NP  mirtazapine  (REMERON ) 15 MG tablet Take 1 tablet (15 mg total) by mouth at bedtime. 05/20/23   Lanny Callander, MD  ondansetron  (ZOFRAN ) 8 MG tablet Take 1 tablet (8 mg total) by mouth every 8  (eight) hours as needed for nausea or vomiting. 02/22/24   Lanny Callander, MD  pantoprazole  (PROTONIX ) 40 MG tablet Take 1 tablet (40 mg total) by mouth daily. 02/22/24   Lanny Callander, MD  potassium chloride  SA (KLOR-CON  M) 20 MEQ tablet Take 1 tablet (20 mEq total) by mouth daily. 01/30/23   Lanny Callander, MD    Allergies: Aspirin    Review of Systems  Constitutional:  Negative for fever.  HENT:  Negative for sore throat.   Gastrointestinal:  Positive for abdominal pain. Negative for anorexia, constipation, nausea and vomiting.  Genitourinary:  Negative for vaginal bleeding and vaginal discharge.  All other systems reviewed and are negative.   Updated Vital Signs BP (!) 143/76   Pulse 62   Temp 98.2 F (36.8 C) (Oral)   Resp 18   SpO2 100%   Physical Exam Vitals and nursing note reviewed.  Constitutional:      General: He is not in acute distress.    Appearance: Normal appearance. He is well-developed. He is not diaphoretic.  HENT:     Head: Normocephalic and atraumatic.     Nose: Nose normal.  Eyes:     Conjunctiva/sclera: Conjunctivae normal.     Pupils: Pupils are equal, round, and reactive to light.  Cardiovascular:     Rate and Rhythm: Normal rate and regular rhythm.     Pulses: Normal pulses.  Heart sounds: Normal heart sounds.  Pulmonary:     Effort: Pulmonary effort is normal.     Breath sounds: Normal breath sounds. No wheezing or rales.  Abdominal:     General: Bowel sounds are normal.     Palpations: Abdomen is soft.     Tenderness: There is no abdominal tenderness. There is no guarding or rebound.  Musculoskeletal:        General: Normal range of motion.     Cervical back: Normal range of motion and neck supple.  Skin:    General: Skin is warm and dry.     Capillary Refill: Capillary refill takes less than 2 seconds.  Neurological:     General: No focal deficit present.     Mental Status: He is alert and oriented to person, place, and time.     Deep Tendon  Reflexes: Reflexes normal.  Psychiatric:        Mood and Affect: Mood normal.        Behavior: Behavior normal.     (all labs ordered are listed, but only abnormal results are displayed) Results for orders placed or performed during the hospital encounter of 02/22/24  Comprehensive metabolic panel   Collection Time: 02/22/24 10:52 PM  Result Value Ref Range   Sodium 134 (L) 135 - 145 mmol/L   Potassium 4.6 3.5 - 5.1 mmol/L   Chloride 103 98 - 111 mmol/L   CO2 23 22 - 32 mmol/L   Glucose, Bld 81 70 - 99 mg/dL   BUN 24 (H) 6 - 20 mg/dL   Creatinine, Ser 8.92 0.61 - 1.24 mg/dL   Calcium  8.8 (L) 8.9 - 10.3 mg/dL   Total Protein 7.4 6.5 - 8.1 g/dL   Albumin  3.1 (L) 3.5 - 5.0 g/dL   AST 73 (H) 15 - 41 U/L   ALT 14 0 - 44 U/L   Alkaline Phosphatase 368 (H) 38 - 126 U/L   Total Bilirubin 0.4 0.0 - 1.2 mg/dL   GFR, Estimated >39 >39 mL/min   Anion gap 9 5 - 15  CBC   Collection Time: 02/22/24 10:52 PM  Result Value Ref Range   WBC 6.4 4.0 - 10.5 K/uL   RBC 3.91 (L) 4.22 - 5.81 MIL/uL   Hemoglobin 9.2 (L) 13.0 - 17.0 g/dL   HCT 70.5 (L) 60.9 - 47.9 %   MCV 75.2 (L) 80.0 - 100.0 fL   MCH 23.5 (L) 26.0 - 34.0 pg   MCHC 31.3 30.0 - 36.0 g/dL   RDW 84.5 88.4 - 84.4 %   Platelets 431 (H) 150 - 400 K/uL   nRBC 0.0 0.0 - 0.2 %  Urinalysis, Routine w reflex microscopic -Urine, Clean Catch   Collection Time: 02/23/24 12:46 AM  Result Value Ref Range   Color, Urine YELLOW YELLOW   APPearance CLEAR CLEAR   Specific Gravity, Urine 1.026 1.005 - 1.030   pH 5.0 5.0 - 8.0   Glucose, UA NEGATIVE NEGATIVE mg/dL   Hgb urine dipstick NEGATIVE NEGATIVE   Bilirubin Urine NEGATIVE NEGATIVE   Ketones, ur NEGATIVE NEGATIVE mg/dL   Protein, ur 30 (A) NEGATIVE mg/dL   Nitrite NEGATIVE NEGATIVE   Leukocytes,Ua NEGATIVE NEGATIVE   RBC / HPF 0-5 0 - 5 RBC/hpf   WBC, UA 0-5 0 - 5 WBC/hpf   Bacteria, UA NONE SEEN NONE SEEN   Squamous Epithelial / HPF 0-5 0 - 5 /HPF   Mucus PRESENT    CT ABDOMEN  PELVIS W  CONTRAST Result Date: 02/23/2024 EXAM: CT ABDOMEN AND PELVIS WITH CONTRAST 02/23/2024 01:06:49 AM TECHNIQUE: CT of the abdomen and pelvis was performed with the administration of 100 mL of iohexol  (OMNIPAQUE ) 300 MG/ML solution. Multiplanar reformatted images are provided for review. Automated exposure control, iterative reconstruction, and/or weight-based adjustment of the mA/kV was utilized to reduce the radiation dose to as low as reasonably achievable. COMPARISON: CT dated 04/06/2023. CLINICAL HISTORY: Polytrauma, blunt. History of colon cancer on chemotherapy, gallstones, repair of perforated gastric ulcer. FINDINGS: LOWER CHEST: No acute abnormality. LIVER: Large mass now occupying most of the right hepatic lobe measuring 16.9 cm in greatest dimension. GALLBLADDER AND BILE DUCTS: Gallbladder is unremarkable. No biliary ductal dilatation. SPLEEN: No acute abnormality. PANCREAS: No acute abnormality. ADRENAL GLANDS: No acute abnormality. KIDNEYS, URETERS AND BLADDER: Cortical renal scarring in both kidneys. No stones in the kidneys or ureters. No hydronephrosis. No perinephric or periureteral stranding. Urinary bladder is unremarkable. GI AND BOWEL: Mild diffuse gastric wall thickening. Focal thickening of the transverse colon appears similar to a prior study dated 03/30/2022. There is no bowel obstruction. PERITONEUM AND RETROPERITONEUM: Small volume of abdominal pelvic ascites. Paucity of intraabdominal fat limited assessment of the bowel . No free air. VASCULATURE: Aorta is normal in caliber. Aortic atherosclerotic calcification. LYMPH NODES: No lymphadenopathy. REPRODUCTIVE ORGANS: No acute abnormality. BONES AND SOFT TISSUES: No acute osseous abnormality. No focal soft tissue abnormality. IMPRESSION: 1. Large right hepatic lobe mass (16.9 cm) occupying most of the right hepatic lobe, compatible with progression of metastases. 2. Mild diffuse gastric wall thickening suspicious for gastritis.  Consider correlation with endoscopy. 3. Focal thickening of the transverse colon, similar to 10/15/22 suspicious for known colorectal cancer. 4. Small volume abdominopelvic ascites. Electronically signed by: Norman Gatlin MD 02/23/2024 01:31 AM EST RP Workstation: [id number]     Radiology: CT ABDOMEN PELVIS W CONTRAST Result Date: 02/23/2024 EXAM: CT ABDOMEN AND PELVIS WITH CONTRAST 02/23/2024 01:06:49 AM TECHNIQUE: CT of the abdomen and pelvis was performed with the administration of 100 mL of iohexol  (OMNIPAQUE ) 300 MG/ML solution. Multiplanar reformatted images are provided for review. Automated exposure control, iterative reconstruction, and/or weight-based adjustment of the mA/kV was utilized to reduce the radiation dose to as low as reasonably achievable. COMPARISON: CT dated 04/06/2023. CLINICAL HISTORY: Polytrauma, blunt. History of colon cancer on chemotherapy, gallstones, repair of perforated gastric ulcer. FINDINGS: LOWER CHEST: No acute abnormality. LIVER: Large mass now occupying most of the right hepatic lobe measuring 16.9 cm in greatest dimension. GALLBLADDER AND BILE DUCTS: Gallbladder is unremarkable. No biliary ductal dilatation. SPLEEN: No acute abnormality. PANCREAS: No acute abnormality. ADRENAL GLANDS: No acute abnormality. KIDNEYS, URETERS AND BLADDER: Cortical renal scarring in both kidneys. No stones in the kidneys or ureters. No hydronephrosis. No perinephric or periureteral stranding. Urinary bladder is unremarkable. GI AND BOWEL: Mild diffuse gastric wall thickening. Focal thickening of the transverse colon appears similar to a prior study dated 03/30/2022. There is no bowel obstruction. PERITONEUM AND RETROPERITONEUM: Small volume of abdominal pelvic ascites. Paucity of intraabdominal fat limited assessment of the bowel . No free air. VASCULATURE: Aorta is normal in caliber. Aortic atherosclerotic calcification. LYMPH NODES: No lymphadenopathy. REPRODUCTIVE ORGANS: No acute  abnormality. BONES AND SOFT TISSUES: No acute osseous abnormality. No focal soft tissue abnormality. IMPRESSION: 1. Large right hepatic lobe mass (16.9 cm) occupying most of the right hepatic lobe, compatible with progression of metastases. 2. Mild diffuse gastric wall thickening suspicious for gastritis. Consider correlation with endoscopy. 3. Focal thickening of the transverse  colon, similar to 10/15/22 suspicious for known colorectal cancer. 4. Small volume abdominopelvic ascites. Electronically signed by: Norman Gatlin MD 02/23/2024 01:31 AM EST RP Workstation: [id number]     Procedures   Medications Ordered in the ED  iohexol  (OMNIPAQUE ) 300 MG/ML solution 100 mL (100 mLs Intravenous Contrast Given 02/23/24 0051)  alum & mag hydroxide-simeth (MAALOX/MYLANTA) 200-200-20 MG/5ML suspension 30 mL (30 mLs Oral Given 02/23/24 0105)  ketorolac  (TORADOL ) 30 MG/ML injection 15 mg (15 mg Intravenous Given 02/23/24 0106)                                    Medical Decision Making Patient with generalized abdominal pain   Amount and/or Complexity of Data Reviewed External Data Reviewed: notes.    Details: Previous notes reviewed  Labs: ordered.    Details: Urine is negative for uti.  Normal white count 6.4, low hemoglobin 9.2, elevated platelets 431.  Sodium slight low 134, normal; potassium 4.6, normal creatinine  Radiology: ordered and independent interpretation performed.    Details: Hepatic mass by me on CT  Risk OTC drugs. Prescription drug management. Risk Details: Well appearing. Exam and vitals are benign and reassuring.  No non verbal cues of pain.  Patient has known cancer and is about to be on hospice for this.  I will treat for gastritis with a PPI.  Follow up with your PMD for ongoing care.  Stable for discharge.       Final diagnoses:  Malignant neoplasm of colon, unspecified part of colon (HCC)  Liver mass  Gastritis without bleeding, unspecified chronicity, unspecified  gastritis type   No signs of systemic illness or infection. The patient is nontoxic-appearing on exam and vital signs are within normal limits.  I have reviewed the triage vital signs and the nursing notes. Pertinent labs & imaging results that were available during my care of the patient were reviewed by me and considered in my medical decision making (see chart for details). After history, exam, and medical workup I feel the patient has been appropriately medically screened and is safe for discharge home. Pertinent diagnoses were discussed with the patient. Patient was given return precautions.    ED Discharge Orders     None          Josehua Hammar, MD 02/23/24 [phone]

## 2024-02-24 ENCOUNTER — Telehealth: Payer: Self-pay

## 2024-02-24 ENCOUNTER — Other Ambulatory Visit: Payer: Self-pay

## 2024-02-24 NOTE — Telephone Encounter (Signed)
 Spoke with pt's sister regarding the referral to hospice per Secure Chat message sent to this nurse and Dr Lanny from [facility].  Per Secure Chat conversation, the pt's sister Meade was unaware that the had spoken with Dr Lanny regarding going onto hospice so when [facility] contacted the pt's sister regarding hospice admission she was unaware of his choice.  Pt's sister Meade stated that now since she's aware he has chosen to go onto hospice, she will give them a callback to reschedule the hospice admission appt.  Provided Meade with the telephone number for Hospice.

## 2024-03-18 ENCOUNTER — Other Ambulatory Visit: Payer: Self-pay | Admitting: Hematology

## 2024-04-09 ENCOUNTER — Encounter (HOSPITAL_COMMUNITY): Payer: Self-pay

## 2024-04-09 ENCOUNTER — Other Ambulatory Visit: Payer: Self-pay

## 2024-04-09 ENCOUNTER — Inpatient Hospital Stay (HOSPITAL_COMMUNITY)
Admission: EM | Admit: 2024-04-09 | Discharge: 2024-04-11 | DRG: 378 | Disposition: A | Discharge status: Home / Self Care | Attending: Internal Medicine | Admitting: Internal Medicine

## 2024-04-09 DIAGNOSIS — D500 Iron deficiency anemia secondary to blood loss (chronic): Secondary | ICD-10-CM | POA: Diagnosis present

## 2024-04-09 DIAGNOSIS — F17290 Nicotine dependence, other tobacco product, uncomplicated: Secondary | ICD-10-CM | POA: Diagnosis present

## 2024-04-09 DIAGNOSIS — F17210 Nicotine dependence, cigarettes, uncomplicated: Secondary | ICD-10-CM | POA: Diagnosis present

## 2024-04-09 DIAGNOSIS — B9681 Helicobacter pylori [H. pylori] as the cause of diseases classified elsewhere: Secondary | ICD-10-CM | POA: Diagnosis present

## 2024-04-09 DIAGNOSIS — E876 Hypokalemia: Secondary | ICD-10-CM | POA: Diagnosis present

## 2024-04-09 DIAGNOSIS — T502X5A Adverse effect of carbonic-anhydrase inhibitors, benzothiadiazides and other diuretics, initial encounter: Secondary | ICD-10-CM | POA: Diagnosis present

## 2024-04-09 DIAGNOSIS — K254 Chronic or unspecified gastric ulcer with hemorrhage: Principal | ICD-10-CM | POA: Diagnosis present

## 2024-04-09 DIAGNOSIS — Z860101 Personal history of adenomatous and serrated colon polyps: Secondary | ICD-10-CM

## 2024-04-09 DIAGNOSIS — K2970 Gastritis, unspecified, without bleeding: Secondary | ICD-10-CM | POA: Diagnosis present

## 2024-04-09 DIAGNOSIS — R188 Other ascites: Secondary | ICD-10-CM | POA: Diagnosis present

## 2024-04-09 DIAGNOSIS — D630 Anemia in neoplastic disease: Principal | ICD-10-CM | POA: Diagnosis present

## 2024-04-09 DIAGNOSIS — I3139 Other pericardial effusion (noninflammatory): Secondary | ICD-10-CM | POA: Diagnosis present

## 2024-04-09 DIAGNOSIS — C787 Secondary malignant neoplasm of liver and intrahepatic bile duct: Secondary | ICD-10-CM | POA: Diagnosis present

## 2024-04-09 DIAGNOSIS — Z8249 Family history of ischemic heart disease and other diseases of the circulatory system: Secondary | ICD-10-CM

## 2024-04-09 DIAGNOSIS — C189 Malignant neoplasm of colon, unspecified: Secondary | ICD-10-CM | POA: Diagnosis present

## 2024-04-09 DIAGNOSIS — E8770 Fluid overload, unspecified: Secondary | ICD-10-CM | POA: Diagnosis present

## 2024-04-09 DIAGNOSIS — Z59868 Other specified financial insecurity: Secondary | ICD-10-CM

## 2024-04-09 DIAGNOSIS — J90 Pleural effusion, not elsewhere classified: Secondary | ICD-10-CM | POA: Diagnosis present

## 2024-04-09 DIAGNOSIS — C7801 Secondary malignant neoplasm of right lung: Secondary | ICD-10-CM | POA: Diagnosis present

## 2024-04-09 DIAGNOSIS — E440 Moderate protein-calorie malnutrition: Secondary | ICD-10-CM | POA: Diagnosis present

## 2024-04-09 DIAGNOSIS — D62 Acute posthemorrhagic anemia: Secondary | ICD-10-CM | POA: Diagnosis present

## 2024-04-09 DIAGNOSIS — D649 Anemia, unspecified: Secondary | ICD-10-CM | POA: Diagnosis present

## 2024-04-09 DIAGNOSIS — R600 Localized edema: Secondary | ICD-10-CM | POA: Diagnosis present

## 2024-04-09 DIAGNOSIS — E8809 Other disorders of plasma-protein metabolism, not elsewhere classified: Secondary | ICD-10-CM | POA: Diagnosis present

## 2024-04-09 DIAGNOSIS — D509 Iron deficiency anemia, unspecified: Secondary | ICD-10-CM | POA: Diagnosis present

## 2024-04-09 DIAGNOSIS — G893 Neoplasm related pain (acute) (chronic): Secondary | ICD-10-CM

## 2024-04-09 DIAGNOSIS — Z6823 Body mass index (BMI) 23.0-23.9, adult: Secondary | ICD-10-CM

## 2024-04-09 DIAGNOSIS — Z66 Do not resuscitate: Secondary | ICD-10-CM | POA: Diagnosis present

## 2024-04-09 DIAGNOSIS — Z79899 Other long term (current) drug therapy: Secondary | ICD-10-CM

## 2024-04-09 DIAGNOSIS — C7802 Secondary malignant neoplasm of left lung: Secondary | ICD-10-CM | POA: Diagnosis present

## 2024-04-09 DIAGNOSIS — R7989 Other specified abnormal findings of blood chemistry: Secondary | ICD-10-CM | POA: Diagnosis present

## 2024-04-09 DIAGNOSIS — K219 Gastro-esophageal reflux disease without esophagitis: Secondary | ICD-10-CM | POA: Diagnosis present

## 2024-04-09 DIAGNOSIS — R591 Generalized enlarged lymph nodes: Secondary | ICD-10-CM | POA: Diagnosis present

## 2024-04-09 DIAGNOSIS — J449 Chronic obstructive pulmonary disease, unspecified: Secondary | ICD-10-CM | POA: Diagnosis present

## 2024-04-09 DIAGNOSIS — F17200 Nicotine dependence, unspecified, uncomplicated: Secondary | ICD-10-CM

## 2024-04-09 LAB — COMPREHENSIVE METABOLIC PANEL WITH GFR
ALT: 7 U/L (ref 0–44)
AST: 61 U/L — ABNORMAL HIGH (ref 15–41)
Albumin: 3 g/dL — ABNORMAL LOW (ref 3.5–5.0)
Alkaline Phosphatase: 193 U/L — ABNORMAL HIGH (ref 38–126)
Anion gap: 14 (ref 5–15)
BUN: 22 mg/dL — ABNORMAL HIGH (ref 6–20)
CO2: 22 mmol/L (ref 22–32)
Calcium: 8.5 mg/dL — ABNORMAL LOW (ref 8.9–10.3)
Chloride: 98 mmol/L (ref 98–111)
Creatinine, Ser: 1.17 mg/dL (ref 0.61–1.24)
GFR, Estimated: >60 mL/min
Glucose, Bld: 91 mg/dL (ref 70–99)
Potassium: 3.1 mmol/L — ABNORMAL LOW (ref 3.5–5.1)
Sodium: 134 mmol/L — ABNORMAL LOW (ref 135–145)
Total Bilirubin: 0.7 mg/dL (ref 0.0–1.2)
Total Protein: 7.4 g/dL (ref 6.5–8.1)

## 2024-04-09 LAB — CBC WITH DIFFERENTIAL/PLATELET
Abs Immature Granulocytes: 0.04 K/uL (ref 0.00–0.07)
Basophils Absolute: 0 K/uL (ref 0.0–0.1)
Basophils Relative: 0 %
Eosinophils Absolute: 0 K/uL (ref 0.0–0.5)
Eosinophils Relative: 0 %
HCT: 19.4 % — ABNORMAL LOW (ref 39.0–52.0)
Hemoglobin: 6.1 g/dL — CL (ref 13.0–17.0)
Immature Granulocytes: 0 %
Lymphocytes Relative: 11 %
Lymphs Abs: 1 K/uL (ref 0.7–4.0)
MCH: 21.3 pg — ABNORMAL LOW (ref 26.0–34.0)
MCHC: 31.4 g/dL (ref 30.0–36.0)
MCV: 67.8 fL — ABNORMAL LOW (ref 80.0–100.0)
Monocytes Absolute: 0.7 K/uL (ref 0.1–1.0)
Monocytes Relative: 7 %
Neutro Abs: 7.4 K/uL (ref 1.7–7.7)
Neutrophils Relative %: 82 %
Platelets: 323 K/uL (ref 150–400)
RBC: 2.86 MIL/uL — ABNORMAL LOW (ref 4.22–5.81)
RDW: 18.1 % — ABNORMAL HIGH (ref 11.5–15.5)
WBC: 9.2 K/uL (ref 4.0–10.5)
nRBC: 0 % (ref 0.0–0.2)

## 2024-04-09 LAB — TROPONIN T, HIGH SENSITIVITY: Troponin T High Sensitivity: 22 ng/L — ABNORMAL HIGH (ref 0–19)

## 2024-04-09 LAB — PRO BRAIN NATRIURETIC PEPTIDE: Pro Brain Natriuretic Peptide: 986 pg/mL — ABNORMAL HIGH

## 2024-04-09 MED ORDER — SODIUM CHLORIDE 0.9% IV SOLUTION: Freq: Once | INTRAVENOUS | Status: AC

## 2024-04-09 MED ORDER — FUROSEMIDE 10 MG/ML IJ SOLN
40.0000 mg | Freq: Once | INTRAMUSCULAR | Status: AC
  Administered 2024-04-09: 40 mg via INTRAVENOUS
  Filled 2024-04-09: qty 4

## 2024-04-09 NOTE — ED Provider Notes (Signed)
 " [place] EMERGENCY DEPARTMENT AT [facility] Provider Note   CSN: [id number] Arrival date & time: 04/09/24  2111     Patient presents with: Leg Swelling   Marvin Thomas is a 59 y.o. male.  {Add pertinent medical, surgical, social history, OB history to YEP:67052} Patient to ED with progressively worsening LE edema for the past 1-2 weeks. Seen one week ago and started on Lasix  20 mg daily but reports swelling continues to worsen, now extending to the groin. 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 No fever. He endorses DOE without fever or cough. No chest pain.   The history is provided by the patient. No language interpreter was used.       Prior to Admission medications  Medication Sig Start Date End Date Taking? Authorizing Provider  acetaminophen  (TYLENOL ) 500 MG tablet Take 2 tablets (1,000 mg total) by mouth every 6 (six) hours as needed for mild pain (pain score 1-3) or headache. 01/13/23  Yes Burton, Lacie K, NP  albuterol  (VENTOLIN  HFA) 108 (90 Base) MCG/ACT inhaler Inhale 1-2 puffs into the lungs every 6 (six) hours as needed for wheezing or shortness of breath. 11/09/23  Yes Kammerer, Megan L, DO  furosemide  (LASIX ) 20 MG tablet Take 20 mg by mouth daily.   Yes [provider]  ipratropium-albuterol  (DUONEB) 0.5-2.5 (3) MG/3ML SOLN Take 3 mLs by nebulization 3 (three) times daily as needed. 02/26/24  Yes [provider]  ondansetron  (ZOFRAN ) 8 MG tablet Take 1 tablet (8 mg total) by mouth every 8 (eight) hours as needed for nausea or vomiting. 02/22/24  Yes Lanny Callander, MD  BENADRYL  ALLERGY  25 MG tablet Take 1-2 tablets (25-50 mg total) by mouth every 6 (six) hours as needed for sleep. Patient not taking: Reported on 04/09/2024 01/13/23   Burton, Lacie K, NP  cyanocobalamin  1000 MCG tablet Take 1 tablet (1,000 mcg total) by mouth daily. Patient not taking: Reported on 04/09/2024 01/13/23   Burton, Lacie K, NP  mirtazapine  (REMERON ) 15  MG tablet Take 1 tablet (15 mg total) by mouth at bedtime. Patient not taking: Reported on 04/09/2024 05/20/23   Lanny Callander, MD  pantoprazole  (PROTONIX ) 40 MG tablet Take 1 tablet (40 mg total) by mouth daily. Patient not taking: Reported on 04/09/2024 02/23/24   Palumbo, April, MD  pantoprazole  (PROTONIX ) 40 MG tablet TAKE 1 TABLET BY MOUTH EVERY DAY Patient not taking: Reported on 04/09/2024 03/21/24   Boscia, Heather E, NP  potassium chloride  SA (KLOR-CON  M) 20 MEQ tablet Take 1 tablet (20 mEq total) by mouth daily. Patient not taking: Reported on 04/09/2024 01/30/23   Lanny Callander, MD    Allergies: Patient has no known allergies.    Review of Systems  Updated Vital Signs BP (!) 149/81   Pulse 85   Temp 98 F (36.7 C) (Oral)   Resp 16   Ht 5' 9 (1.753 m)   Wt 72.6 kg   SpO2 100%   BMI 23.63 kg/m   Physical Exam Vitals and nursing note reviewed.  Constitutional:      Appearance: He is well-developed.  HENT:     Head: Normocephalic.  Cardiovascular:     Rate and Rhythm: Normal rate and regular rhythm.     Heart sounds: No murmur heard. Pulmonary:     Effort: Pulmonary effort is normal.     Breath sounds: Normal breath sounds. No wheezing, rhonchi or rales.  Abdominal:     General: Bowel  sounds are normal.     Palpations: Abdomen is soft.     Tenderness: There is no abdominal tenderness. There is no guarding or rebound.  Genitourinary:    Comments: Penile edema. Scrotum is normal in size, no testicular tenderness.  Musculoskeletal:        General: Normal range of motion.     Cervical back: Normal range of motion and neck supple.     Right lower leg: Edema present.     Left lower leg: Edema present.  Skin:    General: Skin is warm and dry.  Neurological:     General: No focal deficit present.     Mental Status: He is alert and oriented to person, place, and time.     (all labs ordered are listed, but only abnormal results are displayed) Labs Reviewed  PRO BRAIN  NATRIURETIC PEPTIDE - Abnormal; Notable for the following components:      Result Value   Pro Brain Natriuretic Peptide 986.0 (*)    All other components within normal limits  CBC WITH DIFFERENTIAL/PLATELET - Abnormal; Notable for the following components:   RBC 2.86 (*)    Hemoglobin 6.1 (*)    HCT 19.4 (*)    MCV 67.8 (*)    MCH 21.3 (*)    RDW 18.1 (*)    All other components within normal limits  COMPREHENSIVE METABOLIC PANEL WITH GFR - Abnormal; Notable for the following components:   Sodium 134 (*)    Potassium 3.1 (*)    BUN 22 (*)    Calcium  8.5 (*)    Albumin  3.0 (*)    AST 61 (*)    Alkaline Phosphatase 193 (*)    All other components within normal limits  TROPONIN T, HIGH SENSITIVITY - Abnormal; Notable for the following components:   Troponin T High Sensitivity 22 (*)    All other components within normal limits  PROTIME-INR  POC OCCULT BLOOD, ED  TYPE AND SCREEN  TROPONIN T, HIGH SENSITIVITY    EKG: None  Radiology: No results found.  {Document cardiac monitor, telemetry assessment procedure when appropriate:32947} Procedures   Medications Ordered in the ED  furosemide  (LASIX ) injection 40 mg (40 mg Intravenous Given 04/09/24 2256)    Clinical Course as of 04/09/24 2333  Sat Apr 09, 2024  2242 Patient to eD with progressive peripheral edema and development of DOE over the last week. No fever, cough. No chest pain. Labs pending. IV lasix  ordered.  [SU]  2332 Found to have a hgb 6.1 (9.2, 02/22/24). He endorses dark appearing stools. Guaiac positive by hemoccult.  [SU]    Clinical Course User Index [SU] Odell Balls, PA-C   {Click here for ABCD2, HEART and other calculators REFRESH Note before signing:1}                              Medical Decision Making Risk Prescription drug management.   ***  {Document critical care time when appropriate  Document review of labs and clinical decision tools ie CHADS2VASC2, etc  Document your independent  review of radiology images and any outside records  Document your discussion with family members, caretakers and with consultants  Document social determinants of health affecting pt's care  Document your decision making why or why not admission, treatments were needed:32947:::1}   Final diagnoses:  None    ED Discharge Orders     None        "

## 2024-04-09 NOTE — ED Triage Notes (Signed)
 Several days of leg swelling not improving with diuretics.   Says he's been taking 20mg  of Furosemide .

## 2024-04-10 ENCOUNTER — Emergency Department (HOSPITAL_COMMUNITY)

## 2024-04-10 ENCOUNTER — Inpatient Hospital Stay (HOSPITAL_COMMUNITY)

## 2024-04-10 DIAGNOSIS — I5021 Acute systolic (congestive) heart failure: Secondary | ICD-10-CM

## 2024-04-10 DIAGNOSIS — M7989 Other specified soft tissue disorders: Secondary | ICD-10-CM | POA: Diagnosis not present

## 2024-04-10 DIAGNOSIS — K625 Hemorrhage of anus and rectum: Secondary | ICD-10-CM | POA: Diagnosis present

## 2024-04-10 DIAGNOSIS — C7801 Secondary malignant neoplasm of right lung: Secondary | ICD-10-CM | POA: Diagnosis present

## 2024-04-10 DIAGNOSIS — R591 Generalized enlarged lymph nodes: Secondary | ICD-10-CM | POA: Diagnosis present

## 2024-04-10 DIAGNOSIS — C189 Malignant neoplasm of colon, unspecified: Secondary | ICD-10-CM | POA: Diagnosis present

## 2024-04-10 DIAGNOSIS — Z79899 Other long term (current) drug therapy: Secondary | ICD-10-CM | POA: Diagnosis not present

## 2024-04-10 DIAGNOSIS — Z66 Do not resuscitate: Secondary | ICD-10-CM | POA: Diagnosis present

## 2024-04-10 DIAGNOSIS — E8770 Fluid overload, unspecified: Secondary | ICD-10-CM | POA: Diagnosis present

## 2024-04-10 DIAGNOSIS — D509 Iron deficiency anemia, unspecified: Secondary | ICD-10-CM | POA: Diagnosis present

## 2024-04-10 DIAGNOSIS — K219 Gastro-esophageal reflux disease without esophagitis: Secondary | ICD-10-CM | POA: Diagnosis present

## 2024-04-10 DIAGNOSIS — I3139 Other pericardial effusion (noninflammatory): Secondary | ICD-10-CM | POA: Diagnosis present

## 2024-04-10 DIAGNOSIS — J90 Pleural effusion, not elsewhere classified: Secondary | ICD-10-CM | POA: Diagnosis present

## 2024-04-10 DIAGNOSIS — B9681 Helicobacter pylori [H. pylori] as the cause of diseases classified elsewhere: Secondary | ICD-10-CM | POA: Diagnosis present

## 2024-04-10 DIAGNOSIS — E440 Moderate protein-calorie malnutrition: Secondary | ICD-10-CM | POA: Diagnosis present

## 2024-04-10 DIAGNOSIS — D630 Anemia in neoplastic disease: Secondary | ICD-10-CM | POA: Diagnosis present

## 2024-04-10 DIAGNOSIS — D649 Anemia, unspecified: Secondary | ICD-10-CM | POA: Diagnosis present

## 2024-04-10 DIAGNOSIS — E8809 Other disorders of plasma-protein metabolism, not elsewhere classified: Secondary | ICD-10-CM | POA: Diagnosis present

## 2024-04-10 DIAGNOSIS — K2970 Gastritis, unspecified, without bleeding: Secondary | ICD-10-CM | POA: Diagnosis present

## 2024-04-10 DIAGNOSIS — C787 Secondary malignant neoplasm of liver and intrahepatic bile duct: Secondary | ICD-10-CM | POA: Diagnosis present

## 2024-04-10 DIAGNOSIS — R188 Other ascites: Secondary | ICD-10-CM | POA: Diagnosis present

## 2024-04-10 DIAGNOSIS — C7802 Secondary malignant neoplasm of left lung: Secondary | ICD-10-CM | POA: Diagnosis present

## 2024-04-10 DIAGNOSIS — K254 Chronic or unspecified gastric ulcer with hemorrhage: Secondary | ICD-10-CM | POA: Diagnosis present

## 2024-04-10 DIAGNOSIS — E876 Hypokalemia: Secondary | ICD-10-CM | POA: Diagnosis present

## 2024-04-10 DIAGNOSIS — J449 Chronic obstructive pulmonary disease, unspecified: Secondary | ICD-10-CM | POA: Diagnosis present

## 2024-04-10 DIAGNOSIS — F17210 Nicotine dependence, cigarettes, uncomplicated: Secondary | ICD-10-CM | POA: Diagnosis present

## 2024-04-10 DIAGNOSIS — D62 Acute posthemorrhagic anemia: Secondary | ICD-10-CM | POA: Diagnosis present

## 2024-04-10 DIAGNOSIS — Z8249 Family history of ischemic heart disease and other diseases of the circulatory system: Secondary | ICD-10-CM | POA: Diagnosis not present

## 2024-04-10 LAB — HEMOGLOBIN A1C
Hgb A1c MFr Bld: 5.1 % (ref 4.8–5.6)
Mean Plasma Glucose: 99.67 mg/dL

## 2024-04-10 LAB — COMPREHENSIVE METABOLIC PANEL WITH GFR
ALT: <5 U/L (ref 0–44)
AST: 57 U/L — ABNORMAL HIGH (ref 15–41)
Albumin: 2.7 g/dL — ABNORMAL LOW (ref 3.5–5.0)
Alkaline Phosphatase: 188 U/L — ABNORMAL HIGH (ref 38–126)
Anion gap: 12 (ref 5–15)
BUN: 21 mg/dL — ABNORMAL HIGH (ref 6–20)
CO2: 24 mmol/L (ref 22–32)
Calcium: 8.3 mg/dL — ABNORMAL LOW (ref 8.9–10.3)
Chloride: 97 mmol/L — ABNORMAL LOW (ref 98–111)
Creatinine, Ser: 1.2 mg/dL (ref 0.61–1.24)
GFR, Estimated: >60 mL/min
Glucose, Bld: 84 mg/dL (ref 70–99)
Potassium: 3 mmol/L — ABNORMAL LOW (ref 3.5–5.1)
Sodium: 132 mmol/L — ABNORMAL LOW (ref 135–145)
Total Bilirubin: 0.7 mg/dL (ref 0.0–1.2)
Total Protein: 6.8 g/dL (ref 6.5–8.1)

## 2024-04-10 LAB — URINALYSIS, ROUTINE W REFLEX MICROSCOPIC
Bilirubin Urine: NEGATIVE
Glucose, UA: NEGATIVE mg/dL
Ketones, ur: NEGATIVE mg/dL
Leukocytes,Ua: NEGATIVE
Nitrite: NEGATIVE
Protein, ur: NEGATIVE mg/dL
Specific Gravity, Urine: 1.014 (ref 1.005–1.030)
pH: 6 (ref 5.0–8.0)

## 2024-04-10 LAB — ECHOCARDIOGRAM COMPLETE
AR max vel: 3.07 cm2
AV Area VTI: 3.07 cm2
AV Area mean vel: 2.57 cm2
AV Mean grad: 4 mmHg
AV Peak grad: 8 mmHg
Ao pk vel: 1.41 m/s
Area-P 1/2: 4.68 cm2
Calc EF: 63.9 %
Height: 69 in
P 1/2 time: 529 ms
S' Lateral: 3.7 cm
Single Plane A2C EF: 70.4 %
Single Plane A4C EF: 58 %
Weight: 2560 [oz_av]

## 2024-04-10 LAB — FERRITIN: Ferritin: 341 ng/mL — ABNORMAL HIGH (ref 24–336)

## 2024-04-10 LAB — CBC
HCT: 19.4 % — ABNORMAL LOW (ref 39.0–52.0)
Hemoglobin: 6.2 g/dL — CL (ref 13.0–17.0)
MCH: 21.8 pg — ABNORMAL LOW (ref 26.0–34.0)
MCHC: 32 g/dL (ref 30.0–36.0)
MCV: 68.3 fL — ABNORMAL LOW (ref 80.0–100.0)
Platelets: 313 K/uL (ref 150–400)
RBC: 2.84 MIL/uL — ABNORMAL LOW (ref 4.22–5.81)
RDW: 18.3 % — ABNORMAL HIGH (ref 11.5–15.5)
WBC: 8.4 K/uL (ref 4.0–10.5)
nRBC: 0 % (ref 0.0–0.2)

## 2024-04-10 LAB — HEMOGLOBIN AND HEMATOCRIT, BLOOD
HCT: 23.3 % — ABNORMAL LOW (ref 39.0–52.0)
HCT: 25 % — ABNORMAL LOW (ref 39.0–52.0)
HCT: 27.1 % — ABNORMAL LOW (ref 39.0–52.0)
Hemoglobin: 7.5 g/dL — ABNORMAL LOW (ref 13.0–17.0)
Hemoglobin: 8.2 g/dL — ABNORMAL LOW (ref 13.0–17.0)
Hemoglobin: 9 g/dL — ABNORMAL LOW (ref 13.0–17.0)

## 2024-04-10 LAB — RETICULOCYTES
Immature Retic Fract: 24.6 % — ABNORMAL HIGH (ref 2.3–15.9)
RBC.: 2.84 MIL/uL — ABNORMAL LOW (ref 4.22–5.81)
Retic Count, Absolute: 45.2 K/uL (ref 19.0–186.0)
Retic Ct Pct: 1.6 % (ref 0.4–3.1)

## 2024-04-10 LAB — IRON AND TIBC
Iron: 13 ug/dL — ABNORMAL LOW (ref 45–182)
Saturation Ratios: 8 % — ABNORMAL LOW (ref 17.9–39.5)
TIBC: 164 ug/dL — ABNORMAL LOW (ref 250–450)
UIBC: 151 ug/dL

## 2024-04-10 LAB — PROTIME-INR
INR: 1.4 — ABNORMAL HIGH (ref 0.8–1.2)
Prothrombin Time: 17.8 s — ABNORMAL HIGH (ref 11.4–15.2)

## 2024-04-10 LAB — VITAMIN B12: Vitamin B-12: 705 pg/mL (ref 180–914)

## 2024-04-10 LAB — FOLATE: Folate: 9.4 ng/mL

## 2024-04-10 LAB — TSH: TSH: 1.54 u[IU]/mL (ref 0.350–4.500)

## 2024-04-10 LAB — PREPARE RBC (CROSSMATCH)

## 2024-04-10 LAB — HIV ANTIBODY (ROUTINE TESTING W REFLEX): HIV Screen 4th Generation wRfx: NONREACTIVE

## 2024-04-10 LAB — TROPONIN T, HIGH SENSITIVITY: Troponin T High Sensitivity: 20 ng/L — ABNORMAL HIGH (ref 0–19)

## 2024-04-10 MED ORDER — IOHEXOL 300 MG/ML  SOLN
100.0000 mL | Freq: Once | INTRAMUSCULAR | Status: AC | PRN
  Administered 2024-04-10: 100 mL via INTRAVENOUS

## 2024-04-10 MED ORDER — PANTOPRAZOLE SODIUM 40 MG PO TBEC
40.0000 mg | DELAYED_RELEASE_TABLET | Freq: Every day | ORAL | Status: DC
  Administered 2024-04-10 – 2024-04-11 (×2): 40 mg via ORAL
  Filled 2024-04-10 (×2): qty 1

## 2024-04-10 MED ORDER — IOHEXOL 300 MG/ML  SOLN: 100.0000 mL | Freq: Once | INTRAMUSCULAR | Status: DC | PRN

## 2024-04-10 MED ORDER — ACETAMINOPHEN 500 MG PO TABS: 1000.0000 mg | ORAL_TABLET | Freq: Four times a day (QID) | ORAL | Status: DC | PRN

## 2024-04-10 MED ORDER — IPRATROPIUM-ALBUTEROL 0.5-2.5 (3) MG/3ML IN SOLN: 3.0000 mL | RESPIRATORY_TRACT | Status: DC | PRN

## 2024-04-10 MED ORDER — IRON SUCROSE 200 MG IVPB - SIMPLE MED
200.0000 mg | Freq: Once | Status: AC
  Administered 2024-04-10: 200 mg via INTRAVENOUS
  Filled 2024-04-10: qty 200

## 2024-04-10 MED ORDER — ACETAMINOPHEN 325 MG PO TABS
650.0000 mg | ORAL_TABLET | Freq: Four times a day (QID) | ORAL | Status: DC | PRN
  Administered 2024-04-10: 650 mg via ORAL
  Filled 2024-04-10: qty 2

## 2024-04-10 MED ORDER — IRON DEXTRAN 50 MG/ML IJ SOLN: 100.0000 mg | Freq: Once | INTRAMUSCULAR | Status: DC

## 2024-04-10 MED ORDER — ONDANSETRON HCL 4 MG PO TABS: 4.0000 mg | ORAL_TABLET | Freq: Four times a day (QID) | ORAL | Status: DC | PRN

## 2024-04-10 MED ORDER — ALBUTEROL SULFATE (2.5 MG/3ML) 0.083% IN NEBU: 2.5000 mg | INHALATION_SOLUTION | RESPIRATORY_TRACT | Status: DC | PRN

## 2024-04-10 MED ORDER — ONDANSETRON HCL 4 MG PO TABS: 8.0000 mg | ORAL_TABLET | Freq: Three times a day (TID) | ORAL | Status: DC | PRN

## 2024-04-10 MED ORDER — ACETAMINOPHEN 650 MG RE SUPP: 650.0000 mg | Freq: Four times a day (QID) | RECTAL | Status: DC | PRN

## 2024-04-10 MED ORDER — FUROSEMIDE 10 MG/ML IJ SOLN
40.0000 mg | Freq: Two times a day (BID) | INTRAMUSCULAR | Status: DC
  Administered 2024-04-10 – 2024-04-11 (×3): 40 mg via INTRAVENOUS
  Filled 2024-04-10 (×3): qty 4

## 2024-04-10 MED ORDER — POTASSIUM CHLORIDE 20 MEQ PO PACK
40.0000 meq | PACK | Freq: Two times a day (BID) | ORAL | Status: DC
  Administered 2024-04-10 – 2024-04-11 (×3): 40 meq via ORAL
  Filled 2024-04-10 (×3): qty 2

## 2024-04-10 MED ORDER — ONDANSETRON HCL 4 MG/2ML IJ SOLN
4.0000 mg | Freq: Four times a day (QID) | INTRAMUSCULAR | Status: DC | PRN
  Administered 2024-04-11: 4 mg via INTRAVENOUS

## 2024-04-10 NOTE — Progress Notes (Signed)
 Bilateral lower extremity venous duplex has been completed.  Results can be found in chart review under CV Proc.  04/10/2024 11:41 AM  Zerrick Hanssen Elden Appl, RVT.

## 2024-04-10 NOTE — Progress Notes (Signed)
 Patient declined having his port accessed at this time.

## 2024-04-10 NOTE — ED Provider Notes (Incomplete)
 " [place] EMERGENCY DEPARTMENT AT [facility] Provider Note   CSN: [id number] Arrival date & time: 04/09/24  2111     Patient presents with: Leg Swelling   Marvin Thomas is a 59 y.o. male.  {Add pertinent medical, surgical, social history, OB history to YEP:67052} Patient to ED with progressively worsening LE edema for the past 1-2 weeks. Seen one week ago and started on Lasix  20 mg daily but reports swelling continues to worsen, now extending to the groin. 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 No fever. He endorses DOE without fever or cough. No chest pain.   The history is provided by the patient. No language interpreter was used.       Prior to Admission medications  Medication Sig Start Date End Date Taking? Authorizing Provider  acetaminophen  (TYLENOL ) 500 MG tablet Take 2 tablets (1,000 mg total) by mouth every 6 (six) hours as needed for mild pain (pain score 1-3) or headache. 01/13/23  Yes Burton, Lacie K, NP  albuterol  (VENTOLIN  HFA) 108 (90 Base) MCG/ACT inhaler Inhale 1-2 puffs into the lungs every 6 (six) hours as needed for wheezing or shortness of breath. 11/09/23  Yes Kammerer, Megan L, DO  furosemide  (LASIX ) 20 MG tablet Take 20 mg by mouth daily.   Yes [provider]  ipratropium-albuterol  (DUONEB) 0.5-2.5 (3) MG/3ML SOLN Take 3 mLs by nebulization 3 (three) times daily as needed. 02/26/24  Yes [provider]  ondansetron  (ZOFRAN ) 8 MG tablet Take 1 tablet (8 mg total) by mouth every 8 (eight) hours as needed for nausea or vomiting. 02/22/24  Yes Lanny Callander, MD  BENADRYL  ALLERGY  25 MG tablet Take 1-2 tablets (25-50 mg total) by mouth every 6 (six) hours as needed for sleep. Patient not taking: Reported on 04/09/2024 01/13/23   Burton, Lacie K, NP  cyanocobalamin  1000 MCG tablet Take 1 tablet (1,000 mcg total) by mouth daily. Patient not taking: Reported on 04/09/2024 01/13/23   Burton, Lacie K, NP  mirtazapine  (REMERON ) 15  MG tablet Take 1 tablet (15 mg total) by mouth at bedtime. Patient not taking: Reported on 04/09/2024 05/20/23   Lanny Callander, MD  pantoprazole  (PROTONIX ) 40 MG tablet Take 1 tablet (40 mg total) by mouth daily. Patient not taking: Reported on 04/09/2024 02/23/24   Palumbo, April, MD  pantoprazole  (PROTONIX ) 40 MG tablet TAKE 1 TABLET BY MOUTH EVERY DAY Patient not taking: Reported on 04/09/2024 03/21/24   Boscia, Heather E, NP  potassium chloride  SA (KLOR-CON  M) 20 MEQ tablet Take 1 tablet (20 mEq total) by mouth daily. Patient not taking: Reported on 04/09/2024 01/30/23   Lanny Callander, MD    Allergies: Patient has no known allergies.    Review of Systems  Updated Vital Signs BP (!) 149/81   Pulse 85   Temp 98 F (36.7 C) (Oral)   Resp 16   Ht 5' 9 (1.753 m)   Wt 72.6 kg   SpO2 100%   BMI 23.63 kg/m   Physical Exam Vitals and nursing note reviewed.  Constitutional:      Appearance: He is well-developed.  HENT:     Head: Normocephalic.  Cardiovascular:     Rate and Rhythm: Normal rate and regular rhythm.     Heart sounds: No murmur heard. Pulmonary:     Effort: Pulmonary effort is normal.     Breath sounds: Normal breath sounds. No wheezing, rhonchi or rales.  Abdominal:     General: Bowel  sounds are normal.     Palpations: Abdomen is soft.     Tenderness: There is no abdominal tenderness. There is no guarding or rebound.  Genitourinary:    Comments: Penile edema. Scrotum is normal in size, no testicular tenderness.  Musculoskeletal:        General: Normal range of motion.     Cervical back: Normal range of motion and neck supple.     Right lower leg: Edema present.     Left lower leg: Edema present.  Skin:    General: Skin is warm and dry.  Neurological:     General: No focal deficit present.     Mental Status: He is alert and oriented to person, place, and time.     (all labs ordered are listed, but only abnormal results are displayed) Labs Reviewed  PRO BRAIN  NATRIURETIC PEPTIDE - Abnormal; Notable for the following components:      Result Value   Pro Brain Natriuretic Peptide 986.0 (*)    All other components within normal limits  CBC WITH DIFFERENTIAL/PLATELET - Abnormal; Notable for the following components:   RBC 2.86 (*)    Hemoglobin 6.1 (*)    HCT 19.4 (*)    MCV 67.8 (*)    MCH 21.3 (*)    RDW 18.1 (*)    All other components within normal limits  COMPREHENSIVE METABOLIC PANEL WITH GFR - Abnormal; Notable for the following components:   Sodium 134 (*)    Potassium 3.1 (*)    BUN 22 (*)    Calcium  8.5 (*)    Albumin  3.0 (*)    AST 61 (*)    Alkaline Phosphatase 193 (*)    All other components within normal limits  TROPONIN T, HIGH SENSITIVITY - Abnormal; Notable for the following components:   Troponin T High Sensitivity 22 (*)    All other components within normal limits  PROTIME-INR  POC OCCULT BLOOD, ED  TYPE AND SCREEN  PREPARE RBC (CROSSMATCH)  TROPONIN T, HIGH SENSITIVITY    EKG: None  Radiology: No results found.  {Document cardiac monitor, telemetry assessment procedure when appropriate:32947} .Critical Care  Performed by: Odell Balls, PA-C Authorized by: Odell Balls, PA-C   Critical care provider statement:    Critical care time (minutes):  50   Critical care was necessary to treat or prevent imminent or life-threatening deterioration of the following conditions: critical anemia.   Critical care was time spent personally by me on the following activities:  Discussions with consultants, discussions with primary provider, examination of patient, ordering and review of laboratory studies, ordering and review of radiographic studies, pulse oximetry, re-evaluation of patient's condition and review of old charts    Medications Ordered in the ED  0.9 %  sodium chloride  infusion (Manually program via Guardrails IV Fluids) (has no administration in time range)  furosemide  (LASIX ) injection 40 mg (40 mg  Intravenous Given 04/09/24 2256)    Clinical Course as of 04/10/24 0003  Sat Apr 09, 2024  2242 Patient to eD with progressive peripheral edema and development of DOE over the last week. No fever, cough. No chest pain. Labs pending. IV lasix  ordered.  [SU]  2332 Found to have a hgb 6.1 (9.2, 02/22/24). He endorses dark appearing stools. Guaiac positive by hemoccult.  [SU]  Sun Apr 10, 2024  0001 He is hemodynamically stable. 2 units PRBCs ordered. Requested GI consultation in patient (patient of Dr. Saintclair), hospitalist paged for admission.  [SU]  Clinical Course User Index [SU] Odell Balls, PA-C   {Click here for ABCD2, HEART and other calculators REFRESH Note before signing:1}                              Medical Decision Making Amount and/or Complexity of Data Reviewed Labs: ordered.  Risk Prescription drug management.   ***  {Document critical care time when appropriate  Document review of labs and clinical decision tools ie CHADS2VASC2, etc  Document your independent review of radiology images and any outside records  Document your discussion with family members, caretakers and with consultants  Document social determinants of health affecting pt's care  Document your decision making why or why not admission, treatments were needed:32947:::1}   Final diagnoses:  None    ED Discharge Orders     None        "

## 2024-04-10 NOTE — H&P (Addendum)
 " History and Physical    Marvin Thomas FMW:[id number] DOB: 05/04/1965 DOA: 04/09/2024  PCP: Marvin Callander, MD  Patient coming from: home  I have personally briefly reviewed patient's old medical records in [facility]  Chief Complaint: leg swelling b/l x several days , not improving with prescribed diuretics   HPI: Marvin Thomas is a 59 y.o. 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 He also now noted DOE. Patient however denies fever/chills , nv/d/. He does however note episode of dark stools and episode of brbpr a few days ago. He notes no presyncope and states the he has similar episode months prior. He notes these episodes are not regular.   ED Course:  Afeb, bp 149/81, hr 85, rr 16, sat 100%  Labs  BNP 986 CE 22,20 Wbc 9.2, hgb 6.1 ( 9.2) Na 134, K 3.1 ,bicarb 22, Glu 91, ast 61 alkphos 193 INR 1.4 IMPRESSION: 1. Increased prominence of the right paratracheal and AP window region corresponding to lymphadenopathy. This is consistent with the patient's given clinical history of metastatic colon carcinoma Tx lasix  40mg  iv  Review of Systems: As per HPI otherwise 10 point review of systems negative.   Past Medical History:  Diagnosis Date   Anemia Dx 2014   Gallstones    Hemothorax    Ulcer     Past Surgical History:  Procedure Laterality Date   BIOPSY  10/18/2022   Procedure: BIOPSY;  Surgeon: Elicia Claw, MD;  Location: [facility];  Service: Gastroenterology;;   BIOPSY  10/24/2022   Procedure: BIOPSY;  Surgeon: Saintclair Jasper, MD;  Location: [facility];  Service: Gastroenterology;;   COLONOSCOPY WITH PROPOFOL  N/A 10/18/2022   Procedure: COLONOSCOPY WITH PROPOFOL ;  Surgeon: Elicia Claw, MD;  Location: [facility];   Service: Gastroenterology;  Laterality: N/A;   COLONOSCOPY WITH PROPOFOL  N/A 10/24/2022   Procedure: COLONOSCOPY WITH PROPOFOL ;  Surgeon: Saintclair Jasper, MD;  Location: [facility];  Service: Gastroenterology;  Laterality: N/A;   ESOPHAGOGASTRODUODENOSCOPY (EGD) WITH PROPOFOL  N/A 10/18/2022   Procedure: ESOPHAGOGASTRODUODENOSCOPY (EGD) WITH PROPOFOL ;  Surgeon: Elicia Claw, MD;  Location: [facility];  Service: Gastroenterology;  Laterality: N/A;   IR IMAGING GUIDED PORT INSERTION  11/12/2022   LAPAROTOMY N/A 12/09/2017   Procedure: EXPLORATORY LAPAROTOMY, REPAIR PERFORATED PYLEORIC ULCER;  Surgeon: Aron Shoulders, MD;  Location: [facility];  Service: General;  Laterality: N/A;   POLYPECTOMY  10/24/2022   Procedure: POLYPECTOMY;  Surgeon: Saintclair Jasper, MD;  Location: [facility];  Service: Gastroenterology;;   SUBMUCOSAL TATTOO INJECTION  10/18/2022   Procedure: SUBMUCOSAL TATTOO INJECTION;  Surgeon: Elicia Claw, MD;  Location: [facility];  Service: Gastroenterology;;     reports that he has been smoking cigarettes. He has a 46.5 pack-year smoking history. He uses smokeless tobacco. He reports current alcohol use. He reports current drug use. Drug: Marijuana.  Allergies[1]  Family History  Problem Relation Age of Onset   Diabetes Mother    Hypertension Mother    Heart disease Mother        x 3    Cancer Sister        BREAST CANCER   Heart disease Brother    Hypertension Brother    Hypertension Brother    Hypertension Brother    Diabetes Other  Prior to Admission medications  Medication Sig Start Date End Date Taking? Authorizing Provider  acetaminophen  (TYLENOL ) 500 MG tablet Take 2 tablets (1,000 mg total) by mouth every 6 (six) hours as needed for mild pain (pain score 1-3) or headache. 01/13/23  Yes Burton, Lacie K, NP  albuterol  (VENTOLIN  HFA) 108 (90 Base) MCG/ACT inhaler Inhale 1-2 puffs into the lungs every 6 (six) hours as needed for wheezing or shortness of breath.  11/09/23  Yes Kammerer, Megan L, DO  furosemide  (LASIX ) 20 MG tablet Take 20 mg by mouth daily.   Yes [provider]  ipratropium-albuterol  (DUONEB) 0.5-2.5 (3) MG/3ML SOLN Take 3 mLs by nebulization 3 (three) times daily as needed. 02/26/24  Yes [provider]  ondansetron  (ZOFRAN ) 8 MG tablet Take 1 tablet (8 mg total) by mouth every 8 (eight) hours as needed for nausea or vomiting. 02/22/24  Yes Marvin Callander, MD  BENADRYL  ALLERGY  25 MG tablet Take 1-2 tablets (25-50 mg total) by mouth every 6 (six) hours as needed for sleep. Patient not taking: Reported on 04/09/2024 01/13/23   Burton, Lacie K, NP  cyanocobalamin  1000 MCG tablet Take 1 tablet (1,000 mcg total) by mouth daily. Patient not taking: Reported on 04/09/2024 01/13/23   Burton, Lacie K, NP  mirtazapine  (REMERON ) 15 MG tablet Take 1 tablet (15 mg total) by mouth at bedtime. Patient not taking: Reported on 04/09/2024 05/20/23   Marvin Callander, MD  pantoprazole  (PROTONIX ) 40 MG tablet Take 1 tablet (40 mg total) by mouth daily. Patient not taking: Reported on 04/09/2024 02/23/24   Palumbo, April, MD  pantoprazole  (PROTONIX ) 40 MG tablet TAKE 1 TABLET BY MOUTH EVERY DAY Patient not taking: Reported on 04/09/2024 03/21/24   Boscia, Heather E, NP  potassium chloride  SA (KLOR-CON  M) 20 MEQ tablet Take 1 tablet (20 mEq total) by mouth daily. Patient not taking: Reported on 04/09/2024 01/30/23   Marvin Callander, MD    Physical Exam: Vitals:   04/09/24 2119 04/09/24 2126 04/09/24 2330  BP: (!) 149/81  (!) 140/71  Pulse: 85  75  Resp: 16    Temp: 98 F (36.7 C)    TempSrc: Oral    SpO2: 100%  100%  Weight:  72.6 kg   Height:  5' 9 (1.753 m)     Constitutional: NAD, calm, comfortable Vitals:   04/09/24 2119 04/09/24 2126 04/09/24 2330  BP: (!) 149/81  (!) 140/71  Pulse: 85  75  Resp: 16    Temp: 98 F (36.7 C)    TempSrc: Oral    SpO2: 100%  100%  Weight:  72.6 kg   Height:  5' 9 (1.753 m)    Eyes: PERRL, lids and  conjunctivae normal ENMT: Mucous membranes are moist. Posterior pharynx clear of any exudate or lesions.Normal dentition.  Neck: normal, supple, no masses, no thyromegaly Respiratory: clear to auscultation bilaterally, no wheezing, no crackles. Normal respiratory effort. No accessory muscle use.  Cardiovascular: Regular rate and rhythm, no murmurs / rubs / gallops.+ extremity edema.  Abdomen: no tenderness, mid-abdomen firm and hard but not tender  +mass  Musculoskeletal: no clubbing / cyanosis. No joint deformity upper and lower extremities. Good ROM, no contractures. Normal muscle tone.  Skin: no rashes, lesions, ulcers. No induration Neurologic: CN 2-12 grossly intact. Sensation intact, Strength 5/5 in all 4.  Psychiatric: Normal judgment and insight. Alert and oriented x 3. Normal mood.    Labs on Admission: I have personally reviewed following labs and imaging studies  CBC: Recent Labs  Lab 04/09/24 2242  WBC 9.2  NEUTROABS 7.4  HGB 6.1*  HCT 19.4*  MCV 67.8*  PLT 323   Basic Metabolic Panel: Recent Labs  Lab 04/09/24 2242  NA 134*  K 3.1*  CL 98  CO2 22  GLUCOSE 91  BUN 22*  CREATININE 1.17  CALCIUM  8.5*   GFR: Estimated Creatinine Clearance: 68.8 mL/min (by C-G formula based on SCr of 1.17 mg/dL). Liver Function Tests: Recent Labs  Lab 04/09/24 2242  AST 61*  ALT 7  ALKPHOS 193*  BILITOT 0.7  PROT 7.4  ALBUMIN  3.0*   No results for input(s): LIPASE, AMYLASE in the last 168 hours. No results for input(s): AMMONIA in the last 168 hours. Coagulation Profile: Recent Labs  Lab 04/09/24 2334  INR 1.4*   Cardiac Enzymes: No results for input(s): CKTOTAL, CKMB, CKMBINDEX, TROPONINI in the last 168 hours. BNP (last 3 results) Recent Labs    04/09/24 2242  PROBNP 986.0*   HbA1C: No results for input(s): HGBA1C in the last 72 hours. CBG: No results for input(s): GLUCAP in the last 168 hours. Lipid Profile: No results for input(s):  CHOL, HDL, LDLCALC, TRIG, CHOLHDL, LDLDIRECT in the last 72 hours. Thyroid Function Tests: No results for input(s): TSH, T4TOTAL, FREET4, T3FREE, THYROIDAB in the last 72 hours. Anemia Panel: No results for input(s): VITAMINB12, FOLATE, FERRITIN, TIBC, IRON , RETICCTPCT in the last 72 hours. Urine analysis:    Component Value Date/Time   COLORURINE YELLOW 02/23/2024 0046   APPEARANCEUR CLEAR 02/23/2024 0046   LABSPEC 1.026 02/23/2024 0046   PHURINE 5.0 02/23/2024 0046   GLUCOSEU NEGATIVE 02/23/2024 0046   HGBUR NEGATIVE 02/23/2024 0046   BILIRUBINUR NEGATIVE 02/23/2024 0046   KETONESUR NEGATIVE 02/23/2024 0046   PROTEINUR 30 (A) 02/23/2024 0046   UROBILINOGEN 1.0 11/29/2014 1601   NITRITE NEGATIVE 02/23/2024 0046   LEUKOCYTESUR NEGATIVE 02/23/2024 0046    Radiological Exams on Admission: No results found.  EKG: Independently reviewed.  Assessment/Plan  Symptomatic Anemia  -in setting of metastatic colon cancer  - notes episode of black stool as well as small amount of brbpr  few days ago  - hgb 6.9 down from 9.2  - s/p 2  units PRBC in ED  -ppi  -supportive   Fluid overload  - elevated bnp , doe ,lower extremity edema -start lasix  iv 40 bid  - f/u with echo in am  -ct scan  chest no pulmonary edema, however abd note mild ascites -will need to watch renal function closely on diuretics  B/l lower extremity edema  -presumed related to fluid overload vs due to progression of cancer with venous obstruction vs clot  - will continue with iv lasix   - f/u with b/l lower extremity dopplers  -CT abd/pelvis ordered as well as chest   Metastatic colon cancer with progression -mets to liver/lung  - followed by doctor Marvin -presents with b/l lower extremity swelling and anemia  -presumed symptoms related to fluid overload vs venous blockage  vs progression of disease  -on hospice /Dnr  Abnormal CT ? Pyelonephritis -UA noted to be negative ,  no symptoms.  COPD - not thought to be in acute exacerbation   GERD -ppi   Trace pericardial effusion  -seen on prior CT 6 months ago  -repeat Chest CT/ echo pending   DVT prophylaxis scd Code Status:DNR/ as discussed per patient wishes in event of cardiac arrest  Family Communication: none at bedside Disposition Plan: patient  expected  to be admitted greater than 2 midnights  Consults called:  please consult Oncology in am  Admission status: progressive care   Camila DELENA Ned MD [facility]   If 7PM-7AM, please contact night-coverage [email or web address] Password [facility]  04/10/2024, 12:33 AM       [1] No Known Allergies  "

## 2024-04-10 NOTE — H&P (View-Only) (Signed)
 [facility] Consult  Referring Provider: No ref. provider found Primary Care Physician:  Lanny Callander, MD Primary Gastroenterologist: [facility]  Reason for Consultation: Melena, anemia, history of colon cancer  SUBJECTIVE:   HPI: Marvin Thomas is a 59 y.o. 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  Colonoscopy 10/24/2022 showed ileocecal valve mass, 20 mm transverse colon polyp.  Biopsies revealed tubulovillous adenoma.  Subsequent liver biopsy showed adenocarcinoma consistent with colorectal primary.  He did receive chemotherapy, though has since transitioned to hospice care.  EGD 10/18/2022 showed gastric ulcer, cratered duodenal ulcer 15 mm.  He noted roughly 2-week history of melena and progressive dyspnea.  He uses full dose aspirin.  He uses pantoprazole  on a daily basis.  Last use of cocaine was 2 days prior.  Has some upper abdominal pain.  No significant chest pain.  Weight has been stable.  Hemoglobin 6.2 status post 2 units PRBC with improvement of hemoglobin to 7.5 (baseline hemoglobin 9.2 on 02/22/2024), WBC 8.4, platelet 313.  CT scan of chest abdomen pelvis on 1/18/thousand 26 showed right colon enhancing mass, significant hepatic metastasis involving the right lobe, stomach and small bowel within normal limits, progressive pulmonary metastatic disease.  Past Medical History:  Diagnosis Date   Anemia Dx 2014   Gallstones    Hemothorax    Ulcer    Past Surgical History:  Procedure Laterality Date   BIOPSY  10/18/2022   Procedure: BIOPSY;  Surgeon: Elicia Claw, MD;  Location: [facility];  Service: Gastroenterology;;   BIOPSY  10/24/2022   Procedure: BIOPSY;  Surgeon: Saintclair Jasper, MD;  Location: [facility];  Service: Gastroenterology;;   COLONOSCOPY WITH PROPOFOL  N/A 10/18/2022   Procedure: COLONOSCOPY  WITH PROPOFOL ;  Surgeon: Elicia Claw, MD;  Location: [facility];  Service: Gastroenterology;  Laterality: N/A;   COLONOSCOPY WITH PROPOFOL  N/A 10/24/2022   Procedure: COLONOSCOPY WITH PROPOFOL ;  Surgeon: Saintclair Jasper, MD;  Location: [facility];  Service: Gastroenterology;  Laterality: N/A;   ESOPHAGOGASTRODUODENOSCOPY (EGD) WITH PROPOFOL  N/A 10/18/2022   Procedure: ESOPHAGOGASTRODUODENOSCOPY (EGD) WITH PROPOFOL ;  Surgeon: Elicia Claw, MD;  Location: [facility];  Service: Gastroenterology;  Laterality: N/A;   IR IMAGING GUIDED PORT INSERTION  11/12/2022   LAPAROTOMY N/A 12/09/2017   Procedure: EXPLORATORY LAPAROTOMY, REPAIR PERFORATED PYLEORIC ULCER;  Surgeon: Aron Shoulders, MD;  Location: [facility];  Service: General;  Laterality: N/A;   POLYPECTOMY  10/24/2022   Procedure: POLYPECTOMY;  Surgeon: Saintclair Jasper, MD;  Location: [facility];  Service: Gastroenterology;;   SUBMUCOSAL TATTOO INJECTION  10/18/2022   Procedure: SUBMUCOSAL TATTOO INJECTION;  Surgeon: Elicia Claw, MD;  Location: [facility];  Service: Gastroenterology;;   Prior to Admission medications  Medication Sig Start Date End Date Taking? Authorizing Provider  acetaminophen  (TYLENOL ) 500 MG tablet Take 2 tablets (1,000 mg total) by mouth every 6 (six) hours as needed for mild pain (pain score 1-3) or headache. 01/13/23  Yes Burton, Lacie K, NP  albuterol  (VENTOLIN  HFA) 108 (90 Base) MCG/ACT inhaler Inhale 1-2 puffs into the lungs every 6 (six) hours as needed for wheezing or shortness of breath. 11/09/23  Yes Kammerer, Megan L, DO  furosemide  (LASIX ) 20 MG tablet Take 20 mg by mouth daily.   Yes [provider]  ipratropium-albuterol  (DUONEB) 0.5-2.5 (3) MG/3ML SOLN Take 3 mLs by nebulization 3 (three) times daily as needed. 02/26/24  Yes  [provider]  ondansetron  (ZOFRAN ) 8 MG tablet Take 1 tablet (8 mg total) by mouth every 8 (eight) hours as needed for nausea or vomiting. 02/22/24  Yes Lanny Callander, MD   BENADRYL  ALLERGY  25 MG tablet Take 1-2 tablets (25-50 mg total) by mouth every 6 (six) hours as needed for sleep. Patient not taking: Reported on 04/09/2024 01/13/23   Burton, Lacie K, NP  cyanocobalamin  1000 MCG tablet Take 1 tablet (1,000 mcg total) by mouth daily. Patient not taking: Reported on 04/09/2024 01/13/23   Burton, Lacie K, NP  mirtazapine  (REMERON ) 15 MG tablet Take 1 tablet (15 mg total) by mouth at bedtime. Patient not taking: Reported on 04/09/2024 05/20/23   Lanny Callander, MD  pantoprazole  (PROTONIX ) 40 MG tablet Take 1 tablet (40 mg total) by mouth daily. Patient not taking: Reported on 04/09/2024 02/23/24   Palumbo, April, MD  pantoprazole  (PROTONIX ) 40 MG tablet TAKE 1 TABLET BY MOUTH EVERY DAY Patient not taking: Reported on 04/09/2024 03/21/24   Boscia, Heather E, NP  potassium chloride  SA (KLOR-CON  M) 20 MEQ tablet Take 1 tablet (20 mEq total) by mouth daily. Patient not taking: Reported on 04/09/2024 01/30/23   Lanny Callander, MD   Current Facility-Administered Medications  Medication Dose Route Frequency Provider Last Rate Last Admin   acetaminophen  (TYLENOL ) tablet 650 mg  650 mg Oral Q6H PRN Debby Camila LABOR, MD       Or   acetaminophen  (TYLENOL ) suppository 650 mg  650 mg Rectal Q6H PRN Debby Camila LABOR, MD       albuterol  (PROVENTIL ) (2.5 MG/3ML) 0.083% nebulizer solution 2.5 mg  2.5 mg Nebulization Q2H PRN Debby Camila LABOR, MD       furosemide  (LASIX ) injection 40 mg  40 mg Intravenous Q12H Debby Camila LABOR, MD       iohexol  (OMNIPAQUE ) 300 MG/ML solution 100 mL  100 mL Intravenous Once PRN Odell Balls, PA-C       ipratropium-albuterol  (DUONEB) 0.5-2.5 (3) MG/3ML nebulizer solution 3 mL  3 mL Nebulization Q4H PRN Debby Camila LABOR, MD       ondansetron  (ZOFRAN ) tablet 4 mg  4 mg Oral Q6H PRN Debby Camila LABOR, MD       Or   ondansetron  (ZOFRAN ) injection 4 mg  4 mg Intravenous Q6H PRN Debby Camila LABOR, MD       pantoprazole  (PROTONIX ) EC tablet 40 mg  40  mg Oral Daily Thomas, Sara-Maiz A, MD   40 mg at 04/10/24 9046   potassium chloride  (KLOR-CON ) packet 40 mEq  40 mEq Oral BID Sreeram, Narendranath, MD   40 mEq at 04/10/24 9046   Current Outpatient Medications  Medication Sig Dispense Refill   acetaminophen  (TYLENOL ) 500 MG tablet Take 2 tablets (1,000 mg total) by mouth every 6 (six) hours as needed for mild pain (pain score 1-3) or headache. 30 tablet 2   albuterol  (VENTOLIN  HFA) 108 (90 Base) MCG/ACT inhaler Inhale 1-2 puffs into the lungs every 6 (six) hours as needed for wheezing or shortness of breath. 6.7 g 0   furosemide  (LASIX ) 20 MG tablet Take 20 mg by mouth daily.     ipratropium-albuterol  (DUONEB) 0.5-2.5 (3) MG/3ML SOLN Take 3 mLs by nebulization 3 (three) times daily as needed.     ondansetron  (ZOFRAN ) 8 MG tablet Take 1 tablet (8 mg total) by mouth every 8 (eight) hours as needed for nausea or vomiting. 30 tablet 0   BENADRYL  ALLERGY  25 MG tablet Take 1-2 tablets (25-50  mg total) by mouth every 6 (six) hours as needed for sleep. (Patient not taking: Reported on 04/09/2024) 30 tablet 2   cyanocobalamin  1000 MCG tablet Take 1 tablet (1,000 mcg total) by mouth daily. (Patient not taking: Reported on 04/09/2024) 30 tablet 2   mirtazapine  (REMERON ) 15 MG tablet Take 1 tablet (15 mg total) by mouth at bedtime. (Patient not taking: Reported on 04/09/2024) 30 tablet 1   pantoprazole  (PROTONIX ) 40 MG tablet Take 1 tablet (40 mg total) by mouth daily. (Patient not taking: Reported on 04/09/2024) 30 tablet 0   pantoprazole  (PROTONIX ) 40 MG tablet TAKE 1 TABLET BY MOUTH EVERY DAY (Patient not taking: Reported on 04/09/2024) 45 tablet 1   potassium chloride  SA (KLOR-CON  M) 20 MEQ tablet Take 1 tablet (20 mEq total) by mouth daily. (Patient not taking: Reported on 04/09/2024) 30 tablet 0   Allergies as of 04/09/2024   (No Known Allergies)   Family History  Problem Relation Age of Onset   Diabetes Mother    Hypertension Mother    Heart disease  Mother        x 3    Cancer Sister        BREAST CANCER   Heart disease Brother    Hypertension Brother    Hypertension Brother    Hypertension Brother    Diabetes Other    Social History   Socioeconomic History   Marital status: Single    Spouse name: Not on file   Number of children: 1   Years of education: 12    Highest education level: Not on file  Occupational History   Occupation: Self-Employed    Comment: Landscaping and Financial Trader   Tobacco Use   Smoking status: Every Day    Current packs/day: 1.50    Average packs/day: 1.5 packs/day for 31.0 years (46.5 ttl pk-yrs)    Types: Cigarettes   Smokeless tobacco: Current  Vaping Use   Vaping status: Every Day  Substance and Sexual Activity   Alcohol use: Yes    Comment: quit 1980's    Drug use: Yes    Types: Marijuana    Comment: Patient reports using last night   Sexual activity: Yes    Birth control/protection: Condom  Other Topics Concern   Not on file  Social History Narrative   ** Merged History Encounter ** Lives with mother   Zyree Traynham    Has 47 yo son.    Lives with his mother and grandmother.    Social Drivers of Health   Tobacco Use: High Risk (04/09/2024)   Patient History    Smoking Tobacco Use: Every Day    Smokeless Tobacco Use: Current    Passive Exposure: Not on file  Financial Resource Strain: High Risk (10/31/2022)   Overall Financial Resource Strain (CARDIA)    Difficulty of Paying Living Expenses: Hard  Food Insecurity: Not on File (12/18/2022)   Received from [facility] Insecurity    Food: 0  Transportation Needs: No Transportation Needs (10/16/2022)   PRAPARE - Administrator, Civil Service (Medical): No    Lack of Transportation (Non-Medical): No  Physical Activity: Not on File (07/11/2021)   Received from [facility]   Physical Activity    Physical Activity: 0  Stress: Not on File (07/11/2021)   Received from [facility]   Stress    Stress: 0  Social Connections:  Not on File (12/12/2022)   Received from [employer]  Connectedness: 0  Intimate Partner Violence: Not At Risk (10/16/2022)   Humiliation, Afraid, Rape, and Kick questionnaire    Fear of Current or Ex-Partner: No    Emotionally Abused: No    Physically Abused: No    Sexually Abused: No  Depression (PHQ2-9): Not on file  Alcohol Screen: Not on file  Housing: Low Risk (10/16/2022)   Housing    Last Housing Risk Score: 0  Utilities: Not At Risk (10/16/2022)   [facility]HC Utilities    Threatened with loss of utilities: No  Health Literacy: Not on file   Review of Systems:  Review of Systems  Constitutional:  Negative for weight loss.  Respiratory:  Positive for shortness of breath.   Cardiovascular:  Negative for chest pain.  Gastrointestinal:  Positive for abdominal pain and melena. Negative for nausea and vomiting.    OBJECTIVE:   Temp:  [98 F (36.7 C)-98.9 F (37.2 C)] 98.9 F (37.2 C) (01/18 0815) Pulse Rate:  [66-85] 72 (01/18 0930) Resp:  [16-31] 31 (01/18 0930) BP: (107-149)/(61-81) 122/61 (01/18 0930) SpO2:  [97 %-100 %] 97 % (01/18 0930) Weight:  [72.6 kg] 72.6 kg (01/17 2126)   Physical Exam Constitutional:      General: He is not in acute distress.    Appearance: He is underweight. He is not ill-appearing, toxic-appearing or diaphoretic.  Cardiovascular:     Rate and Rhythm: Normal rate and regular rhythm.  Pulmonary:     Effort: No respiratory distress.     Breath sounds: Normal breath sounds.  Abdominal:     General: Bowel sounds are normal. There is no distension.     Palpations: Abdomen is soft.     Tenderness: There is abdominal tenderness.  Neurological:     Mental Status: He is alert.     Labs: Recent Labs    04/09/24 2242 04/10/24 0221 04/10/24 0511  WBC 9.2 8.4  --   HGB 6.1* 6.2* 7.5*  HCT 19.4* 19.4* 23.3*  PLT 323 313  --    BMET Recent Labs    04/09/24 2242 04/10/24 0221  NA 134* 132*  K 3.1* 3.0*  CL 98 97*  CO2 22  24  GLUCOSE 91 84  BUN 22* 21*  CREATININE 1.17 1.20  CALCIUM  8.5* 8.3*   LFT Recent Labs    04/10/24 0221  PROT 6.8  ALBUMIN  2.7*  AST 57*  ALT <5  ALKPHOS 188*  BILITOT 0.7   PT/INR Recent Labs    04/09/24 2334  LABPROT 17.8*  INR 1.4*    Diagnostic imaging: CT CHEST ABDOMEN PELVIS W CONTRAST Result Date: 04/10/2024 EXAM: CT CHEST, ABDOMEN AND PELVIS WITH CONTRAST 04/10/2024 01:25:31 AM TECHNIQUE: CT of the chest, abdomen and pelvis was performed with the administration of 100 mL of iohexol  (OMNIPAQUE ) 300 MG/ML solution. Multiplanar reformatted images are provided for review. Automated exposure control, iterative reconstruction, and/or weight based adjustment of the mA/kV was utilized to reduce the radiation dose to as low as reasonably achievable. COMPARISON: Comparison study 02/23/2024, chest x-ray from earlier in the same day and CT from 05/07/2023. CLINICAL HISTORY: Known metastatic colon carcinoma with increasing shortness of breath and lower extremity swelling. FINDINGS: CHEST: MEDIASTINUM AND LYMPH NODES: Heart: No cardiac enlargement is seen. Coronary calcifications are seen. The thoracic aorta shows mild atherosclerotic calcification. No aneurysmal dilatation or dissection is noted. The pulmonary artery is within normal limits, although not timed for embolic evaluation. Lymphadenopathy: Right paratracheal and pretracheal adenopathy is noted, measuring 5.3 x  4.5 cm in greatest AP and transverse dimensions, respectively. This is new from a prior exam in February of 2005 and corresponds to that seen on a recent chest x-ray. No hilar adenopathy is seen. The hilar fullness seen on the prior plain film is related to vascularity. No axillary lymphadenopathy. Other: Right chest wall port is noted. The thoracic inlet is within normal limits. LUNGS AND PLEURA: Lungs: The lungs are well aerated bilaterally. Diffuse emphysematous changes are seen. A stable soft tissue lesion is noted in  the posterior medial right upper lobe with central calcification. It measures up to 2.2 cm and is roughly stable from the prior exam. There is a 7 mm nodule along bullous changes in the medial aspect of the right upper lobe, best seen on image number 105 of series 4. This is also new from the prior CT in February of 2025 and consistent with metastatic disease. . Pleura: No pleural effusion. No pneumothorax. Bones: No acute bony abnormality is noted. ABDOMEN AND PELVIS: LIVER: The liver again demonstrates significant metastatic disease occupying almost the entire right lobe. Scattered smaller lesions are noted within the left lobe of the liver. GALLBLADDER AND BILE DUCTS: The gallbladder is decompressed. No biliary ductal dilatation. SPLEEN: No acute abnormality. PANCREAS: No acute abnormality. ADRENAL GLANDS: No acute abnormality. KIDNEYS, URETERS AND BLADDER: No renal calculi or obstructive changes are seen. Delayed images of the kidneys demonstrate patchy abnormal enhancement. This may represent a degree of pyelonephritis. Correlation with laboratory values is recommended. The bladder is partially distended. No perinephric or periureteral stranding. GI AND BOWEL: Stomach and small bowel appear within normal limits. In the right colon and cecum, there is a soft tissue enhancing mass lesion consistent with a known history of colon carcinoma. This is best seen on image number 99 of series 2. No obstructive or inflammatory changes of the colon are seen. There is no bowel obstruction. REPRODUCTIVE ORGANS: The prostate is within normal limits. PERITONEUM AND RETROPERITONEUM: Mild free fluid is noted within the pelvis stable from the prior exam. No free air. VASCULATURE: The aorta demonstrates atherosclerotic calcification without aneurysmal dilatation. ABDOMINAL AND PELVIS LYMPH NODES: No lymphadenopathy. BONES AND SOFT TISSUES: Degenerative changes of the lumbar spine are noted. No acute osseous abnormality. No focal  soft tissue abnormality. IMPRESSION: 1. New right paratracheal and pretracheal adenopathy measuring 5.3 x 4.5 cm, corresponding to recent chest x-ray, consistent with progressive metastatic disease. 2. New 7 mm nodule along bullous changes in the medial aspect of the right upper lobe, suspicious for metastatic disease. 3. Significant metastatic disease in the liver, occupying almost the entire right lobe, with scattered smaller lesions in the left lobe. 4. Soft tissue enhancing mass lesion in the right colon and cecum, consistent with known colon carcinoma. 5. Patchy abnormal enhancement in the kidneys, possibly representing pyelonephritis; recommend correlation with laboratory values. Electronically signed by: Oneil Devonshire MD 04/10/2024 01:39 AM EST RP Workstation: [id number]   DG Chest Portable 1 View Result Date: 04/10/2024 EXAM: 1 VIEW(S) XRAY OF THE CHEST 04/10/2024 12:30:00 AM COMPARISON: 11/09/2023 CLINICAL HISTORY: peripheral edema FINDINGS: LINES, TUBES AND DEVICES: Right chest Port-A-Cath in place with tip at the superior cavoatrial junction. LUNGS AND PLEURA: No focal pulmonary opacity. No pleural effusion. No pneumothorax. HEART AND MEDIASTINUM: Increased prominence of the right paratracheal and AP window region corresponding to lymphadenopathy. No acute abnormality of the cardiac silhouette. BONES AND SOFT TISSUES: No acute osseous abnormality. IMPRESSION: 1. Increased prominence of the right paratracheal and AP window region  corresponding to lymphadenopathy. This is consistent with the patient's given clinical history of metastatic colon carcinoma Electronically signed by: Oneil Devonshire MD 04/10/2024 12:36 AM EST RP Workstation: [id number]   IMPRESSION: Melena Acute blood loss anemia Cocaine use Full dose aspirin use History of gastric ulcer and cratered duodenal ulcer on EGD 10/18/2022 Metastatic colon cancer to liver and lung  PLAN: -Despite being on home hospice care, patient is interested  in pursuing endoscopic evaluation of melena and anemia -Discussed EGD procedure including benefits and risks of bleeding/infection/perforation/missed lesion/anesthesia, he verbalized understanding and elected to proceed -He ate breakfast this a.m., plan for EGD 04/11/2024 -Continue IV PPI every 12 hours -Okay for diet today -N.p.o. at midnight for EGD 1/19/thousand 26   LOS: 0 days   Estefana Keas, [facility]astroenter[facility]

## 2024-04-10 NOTE — Progress Notes (Signed)
. ° ° °  PROCEDURAL EXPEDITER PROGRESS NOTE  Patient Name: Marvin Thomas  DOB:04/05/1965 Date of Admission: 04/09/2024  Date of Assessment:04/10/24   -------------------------------------------------------------------------------------------------------------------   Brief clinical summary: Pt going to Endo tomorrow for EGD  Orders in place:  Yes   Labs, test, and orders reviewed: Y  Requires surgical clearance:  No  Barriers noted: N/A   -------------------------------------------------------------------------------------------------------------------  [facility], [place], [place] Please contact us  directly via secure chat (search for [facility]) or by calling us  at [phone] [facility]).

## 2024-04-10 NOTE — Plan of Care (Signed)
 Courtesy visit: No billing-  Patient seen and examined today morning.  He is admitted to hospitalist service for further management evaluation of symptomatic anemia, fluid overload.  Patient states that he is feeling better, currently on 2 L supplemental oxygen.  Did not get out of bed.  Plan: Patient got 2 units of blood transfusion, hemoglobin posttransfusion 8.2.  Patient will be continued on Lasix  40 mg twice daily, continue to wean supplemental oxygen.  Will get PT OT evaluation.  Iron  level low noted.  Will give 1 time IV iron , follow-up infusion center for further iron  therapy.  Patient will need to follow-up with primary oncologist regarding his cancer treatment.  Further management as per clinical course.

## 2024-04-10 NOTE — Consult Note (Signed)
 [facility] Gastroenterology Consult  Referring Provider: No ref. provider found Primary Care Physician:  Lanny Callander, MD Primary Gastroenterologist: [facility]  Reason for Consultation: Melena, anemia, history of colon cancer  SUBJECTIVE:   HPI: Marvin Thomas is a 59 y.o. 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  Colonoscopy 10/24/2022 showed ileocecal valve mass, 20 mm transverse colon polyp.  Biopsies revealed tubulovillous adenoma.  Subsequent liver biopsy showed adenocarcinoma consistent with colorectal primary.  He did receive chemotherapy, though has since transitioned to hospice care.  EGD 10/18/2022 showed gastric ulcer, cratered duodenal ulcer 15 mm.  He noted roughly 2-week history of melena and progressive dyspnea.  He uses full dose aspirin.  He uses pantoprazole  on a daily basis.  Last use of cocaine was 2 days prior.  Has some upper abdominal pain.  No significant chest pain.  Weight has been stable.  Hemoglobin 6.2 status post 2 units PRBC with improvement of hemoglobin to 7.5 (baseline hemoglobin 9.2 on 02/22/2024), WBC 8.4, platelet 313.  CT scan of chest abdomen pelvis on 1/18/thousand 26 showed right colon enhancing mass, significant hepatic metastasis involving the right lobe, stomach and small bowel within normal limits, progressive pulmonary metastatic disease.  Past Medical History:  Diagnosis Date   Anemia Dx 2014   Gallstones    Hemothorax    Ulcer    Past Surgical History:  Procedure Laterality Date   BIOPSY  10/18/2022   Procedure: BIOPSY;  Surgeon: Elicia Claw, MD;  Location: [facility];  Service: Gastroenterology;;   BIOPSY  10/24/2022   Procedure: BIOPSY;  Surgeon: Saintclair Jasper, MD;  Location: [facility];  Service: Gastroenterology;;   COLONOSCOPY WITH PROPOFOL  N/A 10/18/2022   Procedure: COLONOSCOPY  WITH PROPOFOL ;  Surgeon: Elicia Claw, MD;  Location: [facility];  Service: Gastroenterology;  Laterality: N/A;   COLONOSCOPY WITH PROPOFOL  N/A 10/24/2022   Procedure: COLONOSCOPY WITH PROPOFOL ;  Surgeon: Saintclair Jasper, MD;  Location: [facility];  Service: Gastroenterology;  Laterality: N/A;   ESOPHAGOGASTRODUODENOSCOPY (EGD) WITH PROPOFOL  N/A 10/18/2022   Procedure: ESOPHAGOGASTRODUODENOSCOPY (EGD) WITH PROPOFOL ;  Surgeon: Elicia Claw, MD;  Location: [facility];  Service: Gastroenterology;  Laterality: N/A;   IR IMAGING GUIDED PORT INSERTION  11/12/2022   LAPAROTOMY N/A 12/09/2017   Procedure: EXPLORATORY LAPAROTOMY, REPAIR PERFORATED PYLEORIC ULCER;  Surgeon: Aron Shoulders, MD;  Location: [facility];  Service: General;  Laterality: N/A;   POLYPECTOMY  10/24/2022   Procedure: POLYPECTOMY;  Surgeon: Saintclair Jasper, MD;  Location: [facility];  Service: Gastroenterology;;   SUBMUCOSAL TATTOO INJECTION  10/18/2022   Procedure: SUBMUCOSAL TATTOO INJECTION;  Surgeon: Elicia Claw, MD;  Location: [facility];  Service: Gastroenterology;;   Prior to Admission medications  Medication Sig Start Date End Date Taking? Authorizing Provider  acetaminophen  (TYLENOL ) 500 MG tablet Take 2 tablets (1,000 mg total) by mouth every 6 (six) hours as needed for mild pain (pain score 1-3) or headache. 01/13/23  Yes Burton, Lacie K, NP  albuterol  (VENTOLIN  HFA) 108 (90 Base) MCG/ACT inhaler Inhale 1-2 puffs into the lungs every 6 (six) hours as needed for wheezing or shortness of breath. 11/09/23  Yes Kammerer, Megan L, DO  furosemide  (LASIX ) 20 MG tablet Take 20 mg by mouth daily.   Yes [provider]  ipratropium-albuterol  (DUONEB) 0.5-2.5 (3) MG/3ML SOLN Take 3 mLs by nebulization 3 (three) times daily as needed. 02/26/24  Yes  [provider]  ondansetron  (ZOFRAN ) 8 MG tablet Take 1 tablet (8 mg total) by mouth every 8 (eight) hours as needed for nausea or vomiting. 02/22/24  Yes Lanny Callander, MD   BENADRYL  ALLERGY  25 MG tablet Take 1-2 tablets (25-50 mg total) by mouth every 6 (six) hours as needed for sleep. Patient not taking: Reported on 04/09/2024 01/13/23   Burton, Lacie K, NP  cyanocobalamin  1000 MCG tablet Take 1 tablet (1,000 mcg total) by mouth daily. Patient not taking: Reported on 04/09/2024 01/13/23   Burton, Lacie K, NP  mirtazapine  (REMERON ) 15 MG tablet Take 1 tablet (15 mg total) by mouth at bedtime. Patient not taking: Reported on 04/09/2024 05/20/23   Lanny Callander, MD  pantoprazole  (PROTONIX ) 40 MG tablet Take 1 tablet (40 mg total) by mouth daily. Patient not taking: Reported on 04/09/2024 02/23/24   Palumbo, April, MD  pantoprazole  (PROTONIX ) 40 MG tablet TAKE 1 TABLET BY MOUTH EVERY DAY Patient not taking: Reported on 04/09/2024 03/21/24   Boscia, Heather E, NP  potassium chloride  SA (KLOR-CON  M) 20 MEQ tablet Take 1 tablet (20 mEq total) by mouth daily. Patient not taking: Reported on 04/09/2024 01/30/23   Lanny Callander, MD   Current Facility-Administered Medications  Medication Dose Route Frequency Provider Last Rate Last Admin   acetaminophen  (TYLENOL ) tablet 650 mg  650 mg Oral Q6H PRN Debby Camila LABOR, MD       Or   acetaminophen  (TYLENOL ) suppository 650 mg  650 mg Rectal Q6H PRN Thomas, Sara-Maiz A, MD       albuterol  (PROVENTIL ) (2.5 MG/3ML) 0.083% nebulizer solution 2.5 mg  2.5 mg Nebulization Q2H PRN Debby Camila LABOR, MD       furosemide  (LASIX ) injection 40 mg  40 mg Intravenous Q12H Debby Camila LABOR, MD       iohexol  (OMNIPAQUE ) 300 MG/ML solution 100 mL  100 mL Intravenous Once PRN Odell Balls, PA-C       ipratropium-albuterol  (DUONEB) 0.5-2.5 (3) MG/3ML nebulizer solution 3 mL  3 mL Nebulization Q4H PRN Debby Camila LABOR, MD       ondansetron  (ZOFRAN ) tablet 4 mg  4 mg Oral Q6H PRN Debby Camila LABOR, MD       Or   ondansetron  (ZOFRAN ) injection 4 mg  4 mg Intravenous Q6H PRN Debby Camila LABOR, MD       pantoprazole  (PROTONIX ) EC tablet 40 mg  40  mg Oral Daily Thomas, Sara-Maiz A, MD   40 mg at 04/10/24 9046   potassium chloride  (KLOR-CON ) packet 40 mEq  40 mEq Oral BID Sreeram, Narendranath, MD   40 mEq at 04/10/24 9046   Current Outpatient Medications  Medication Sig Dispense Refill   acetaminophen  (TYLENOL ) 500 MG tablet Take 2 tablets (1,000 mg total) by mouth every 6 (six) hours as needed for mild pain (pain score 1-3) or headache. 30 tablet 2   albuterol  (VENTOLIN  HFA) 108 (90 Base) MCG/ACT inhaler Inhale 1-2 puffs into the lungs every 6 (six) hours as needed for wheezing or shortness of breath. 6.7 g 0   furosemide  (LASIX ) 20 MG tablet Take 20 mg by mouth daily.     ipratropium-albuterol  (DUONEB) 0.5-2.5 (3) MG/3ML SOLN Take 3 mLs by nebulization 3 (three) times daily as needed.     ondansetron  (ZOFRAN ) 8 MG tablet Take 1 tablet (8 mg total) by mouth every 8 (eight) hours as needed for nausea or vomiting. 30 tablet 0   BENADRYL  ALLERGY  25 MG tablet Take 1-2 tablets (25-50  mg total) by mouth every 6 (six) hours as needed for sleep. (Patient not taking: Reported on 04/09/2024) 30 tablet 2   cyanocobalamin  1000 MCG tablet Take 1 tablet (1,000 mcg total) by mouth daily. (Patient not taking: Reported on 04/09/2024) 30 tablet 2   mirtazapine  (REMERON ) 15 MG tablet Take 1 tablet (15 mg total) by mouth at bedtime. (Patient not taking: Reported on 04/09/2024) 30 tablet 1   pantoprazole  (PROTONIX ) 40 MG tablet Take 1 tablet (40 mg total) by mouth daily. (Patient not taking: Reported on 04/09/2024) 30 tablet 0   pantoprazole  (PROTONIX ) 40 MG tablet TAKE 1 TABLET BY MOUTH EVERY DAY (Patient not taking: Reported on 04/09/2024) 45 tablet 1   potassium chloride  SA (KLOR-CON  M) 20 MEQ tablet Take 1 tablet (20 mEq total) by mouth daily. (Patient not taking: Reported on 04/09/2024) 30 tablet 0   Allergies as of 04/09/2024   (No Known Allergies)   Family History  Problem Relation Age of Onset   Diabetes Mother    Hypertension Mother    Heart disease  Mother        x 3    Cancer Sister        BREAST CANCER   Heart disease Brother    Hypertension Brother    Hypertension Brother    Hypertension Brother    Diabetes Other    Social History   Socioeconomic History   Marital status: Single    Spouse name: Not on file   Number of children: 1   Years of education: 12    Highest education level: Not on file  Occupational History   Occupation: Self-Employed    Comment: Landscaping and Financial Trader   Tobacco Use   Smoking status: Every Day    Current packs/day: 1.50    Average packs/day: 1.5 packs/day for 31.0 years (46.5 ttl pk-yrs)    Types: Cigarettes   Smokeless tobacco: Current  Vaping Use   Vaping status: Every Day  Substance and Sexual Activity   Alcohol use: Yes    Comment: quit 1980's    Drug use: Yes    Types: Marijuana    Comment: Patient reports using last night   Sexual activity: Yes    Birth control/protection: Condom  Other Topics Concern   Not on file  Social History Narrative   ** Merged History Encounter ** Lives with mother   Kiano Terrien    Has 70 yo son.    Lives with his mother and grandmother.    Social Drivers of Health   Tobacco Use: High Risk (04/09/2024)   Patient History    Smoking Tobacco Use: Every Day    Smokeless Tobacco Use: Current    Passive Exposure: Not on file  Financial Resource Strain: High Risk (10/31/2022)   Overall Financial Resource Strain (CARDIA)    Difficulty of Paying Living Expenses: Hard  Food Insecurity: Not on File (12/18/2022)   Received from [facility] Insecurity    Food: 0  Transportation Needs: No Transportation Needs (10/16/2022)   PRAPARE - Administrator, Civil Service (Medical): No    Lack of Transportation (Non-Medical): No  Physical Activity: Not on File (07/11/2021)   Received from [facility]   Physical Activity    Physical Activity: 0  Stress: Not on File (07/11/2021)   Received from [facility]   Stress    Stress: 0  Social Connections:  Not on File (12/12/2022)   Received from [employer]  Connectedness: 0  Intimate Partner Violence: Not At Risk (10/16/2022)   Humiliation, Afraid, Rape, and Kick questionnaire    Fear of Current or Ex-Partner: No    Emotionally Abused: No    Physically Abused: No    Sexually Abused: No  Depression (PHQ2-9): Not on file  Alcohol Screen: Not on file  Housing: Low Risk (10/16/2022)   Housing    Last Housing Risk Score: 0  Utilities: Not At Risk (10/16/2022)   [facility] Utilities    Threatened with loss of utilities: No  Health Literacy: Not on file   Review of Systems:  Review of Systems  Constitutional:  Negative for weight loss.  Respiratory:  Positive for shortness of breath.   Cardiovascular:  Negative for chest pain.  Gastrointestinal:  Positive for abdominal pain and melena. Negative for nausea and vomiting.    OBJECTIVE:   Temp:  [98 F (36.7 C)-98.9 F (37.2 C)] 98.9 F (37.2 C) (01/18 0815) Pulse Rate:  [66-85] 72 (01/18 0930) Resp:  [16-31] 31 (01/18 0930) BP: (107-149)/(61-81) 122/61 (01/18 0930) SpO2:  [97 %-100 %] 97 % (01/18 0930) Weight:  [72.6 kg] 72.6 kg (01/17 2126)   Physical Exam Constitutional:      General: He is not in acute distress.    Appearance: He is underweight. He is not ill-appearing, toxic-appearing or diaphoretic.  Cardiovascular:     Rate and Rhythm: Normal rate and regular rhythm.  Pulmonary:     Effort: No respiratory distress.     Breath sounds: Normal breath sounds.  Abdominal:     General: Bowel sounds are normal. There is no distension.     Palpations: Abdomen is soft.     Tenderness: There is abdominal tenderness.  Neurological:     Mental Status: He is alert.     Labs: Recent Labs    04/09/24 2242 04/10/24 0221 04/10/24 0511  WBC 9.2 8.4  --   HGB 6.1* 6.2* 7.5*  HCT 19.4* 19.4* 23.3*  PLT 323 313  --    BMET Recent Labs    04/09/24 2242 04/10/24 0221  NA 134* 132*  K 3.1* 3.0*  CL 98 97*  CO2 22  24  GLUCOSE 91 84  BUN 22* 21*  CREATININE 1.17 1.20  CALCIUM  8.5* 8.3*   LFT Recent Labs    04/10/24 0221  PROT 6.8  ALBUMIN  2.7*  AST 57*  ALT <5  ALKPHOS 188*  BILITOT 0.7   PT/INR Recent Labs    04/09/24 2334  LABPROT 17.8*  INR 1.4*    Diagnostic imaging: CT CHEST ABDOMEN PELVIS W CONTRAST Result Date: 04/10/2024 EXAM: CT CHEST, ABDOMEN AND PELVIS WITH CONTRAST 04/10/2024 01:25:31 AM TECHNIQUE: CT of the chest, abdomen and pelvis was performed with the administration of 100 mL of iohexol  (OMNIPAQUE ) 300 MG/ML solution. Multiplanar reformatted images are provided for review. Automated exposure control, iterative reconstruction, and/or weight based adjustment of the mA/kV was utilized to reduce the radiation dose to as low as reasonably achievable. COMPARISON: Comparison study 02/23/2024, chest x-ray from earlier in the same day and CT from 05/07/2023. CLINICAL HISTORY: Known metastatic colon carcinoma with increasing shortness of breath and lower extremity swelling. FINDINGS: CHEST: MEDIASTINUM AND LYMPH NODES: Heart: No cardiac enlargement is seen. Coronary calcifications are seen. The thoracic aorta shows mild atherosclerotic calcification. No aneurysmal dilatation or dissection is noted. The pulmonary artery is within normal limits, although not timed for embolic evaluation. Lymphadenopathy: Right paratracheal and pretracheal adenopathy is noted, measuring 5.3 x  4.5 cm in greatest AP and transverse dimensions, respectively. This is new from a prior exam in February of 2005 and corresponds to that seen on a recent chest x-ray. No hilar adenopathy is seen. The hilar fullness seen on the prior plain film is related to vascularity. No axillary lymphadenopathy. Other: Right chest wall port is noted. The thoracic inlet is within normal limits. LUNGS AND PLEURA: Lungs: The lungs are well aerated bilaterally. Diffuse emphysematous changes are seen. A stable soft tissue lesion is noted in  the posterior medial right upper lobe with central calcification. It measures up to 2.2 cm and is roughly stable from the prior exam. There is a 7 mm nodule along bullous changes in the medial aspect of the right upper lobe, best seen on image number 105 of series 4. This is also new from the prior CT in February of 2025 and consistent with metastatic disease. . Pleura: No pleural effusion. No pneumothorax. Bones: No acute bony abnormality is noted. ABDOMEN AND PELVIS: LIVER: The liver again demonstrates significant metastatic disease occupying almost the entire right lobe. Scattered smaller lesions are noted within the left lobe of the liver. GALLBLADDER AND BILE DUCTS: The gallbladder is decompressed. No biliary ductal dilatation. SPLEEN: No acute abnormality. PANCREAS: No acute abnormality. ADRENAL GLANDS: No acute abnormality. KIDNEYS, URETERS AND BLADDER: No renal calculi or obstructive changes are seen. Delayed images of the kidneys demonstrate patchy abnormal enhancement. This may represent a degree of pyelonephritis. Correlation with laboratory values is recommended. The bladder is partially distended. No perinephric or periureteral stranding. GI AND BOWEL: Stomach and small bowel appear within normal limits. In the right colon and cecum, there is a soft tissue enhancing mass lesion consistent with a known history of colon carcinoma. This is best seen on image number 99 of series 2. No obstructive or inflammatory changes of the colon are seen. There is no bowel obstruction. REPRODUCTIVE ORGANS: The prostate is within normal limits. PERITONEUM AND RETROPERITONEUM: Mild free fluid is noted within the pelvis stable from the prior exam. No free air. VASCULATURE: The aorta demonstrates atherosclerotic calcification without aneurysmal dilatation. ABDOMINAL AND PELVIS LYMPH NODES: No lymphadenopathy. BONES AND SOFT TISSUES: Degenerative changes of the lumbar spine are noted. No acute osseous abnormality. No focal  soft tissue abnormality. IMPRESSION: 1. New right paratracheal and pretracheal adenopathy measuring 5.3 x 4.5 cm, corresponding to recent chest x-ray, consistent with progressive metastatic disease. 2. New 7 mm nodule along bullous changes in the medial aspect of the right upper lobe, suspicious for metastatic disease. 3. Significant metastatic disease in the liver, occupying almost the entire right lobe, with scattered smaller lesions in the left lobe. 4. Soft tissue enhancing mass lesion in the right colon and cecum, consistent with known colon carcinoma. 5. Patchy abnormal enhancement in the kidneys, possibly representing pyelonephritis; recommend correlation with laboratory values. Electronically signed by: Oneil Devonshire MD 04/10/2024 01:39 AM EST RP Workstation: [id number]   DG Chest Portable 1 View Result Date: 04/10/2024 EXAM: 1 VIEW(S) XRAY OF THE CHEST 04/10/2024 12:30:00 AM COMPARISON: 11/09/2023 CLINICAL HISTORY: peripheral edema FINDINGS: LINES, TUBES AND DEVICES: Right chest Port-A-Cath in place with tip at the superior cavoatrial junction. LUNGS AND PLEURA: No focal pulmonary opacity. No pleural effusion. No pneumothorax. HEART AND MEDIASTINUM: Increased prominence of the right paratracheal and AP window region corresponding to lymphadenopathy. No acute abnormality of the cardiac silhouette. BONES AND SOFT TISSUES: No acute osseous abnormality. IMPRESSION: 1. Increased prominence of the right paratracheal and AP window region  corresponding to lymphadenopathy. This is consistent with the patient's given clinical history of metastatic colon carcinoma Electronically signed by: Oneil Devonshire MD 04/10/2024 12:36 AM EST RP Workstation: [id number]   IMPRESSION: Melena Acute blood loss anemia Cocaine use Full dose aspirin use History of gastric ulcer and cratered duodenal ulcer on EGD 10/18/2022 Metastatic colon cancer to liver and lung  PLAN: -Despite being on home hospice care, patient is interested  in pursuing endoscopic evaluation of melena and anemia -Discussed EGD procedure including benefits and risks of bleeding/infection/perforation/missed lesion/anesthesia, he verbalized understanding and elected to proceed -He ate breakfast this a.m., plan for EGD 04/11/2024 -Continue IV PPI every 12 hours -Okay for diet today -N.p.o. at midnight for EGD 1/19/thousand 26   LOS: 0 days   Estefana Keas, [facility] Gastroenterology

## 2024-04-10 NOTE — Plan of Care (Signed)

## 2024-04-11 ENCOUNTER — Encounter (HOSPITAL_COMMUNITY): Payer: Self-pay | Admitting: Anesthesiology

## 2024-04-11 ENCOUNTER — Encounter (HOSPITAL_COMMUNITY): Admission: EM | Disposition: A | Payer: Self-pay | Source: Home / Self Care | Attending: Internal Medicine

## 2024-04-11 ENCOUNTER — Other Ambulatory Visit (HOSPITAL_COMMUNITY): Payer: Self-pay

## 2024-04-11 ENCOUNTER — Inpatient Hospital Stay (HOSPITAL_COMMUNITY): Payer: Self-pay | Admitting: Anesthesiology

## 2024-04-11 ENCOUNTER — Encounter (HOSPITAL_COMMUNITY): Payer: Self-pay | Admitting: Internal Medicine

## 2024-04-11 ENCOUNTER — Telehealth (HOSPITAL_COMMUNITY): Payer: Self-pay | Admitting: Pharmacist

## 2024-04-11 ENCOUNTER — Telehealth (HOSPITAL_COMMUNITY): Payer: Self-pay

## 2024-04-11 DIAGNOSIS — K2970 Gastritis, unspecified, without bleeding: Secondary | ICD-10-CM | POA: Diagnosis not present

## 2024-04-11 HISTORY — PX: BIOPSY OF SKIN SUBCUTANEOUS TISSUE AND/OR MUCOUS MEMBRANE: SHX6741

## 2024-04-11 HISTORY — PX: ESOPHAGOGASTRODUODENOSCOPY: SHX5428

## 2024-04-11 LAB — BPAM RBC
Blood Product Expiration Date: 202602142359
Blood Product Expiration Date: 202602142359
ISSUE DATE / TIME: 202601180148
ISSUE DATE / TIME: 202601180449
Unit Type and Rh: 6200
Unit Type and Rh: 6200

## 2024-04-11 LAB — TYPE AND SCREEN
ABO/RH(D): A POS
Antibody Screen: NEGATIVE
Unit division: 0
Unit division: 0

## 2024-04-11 LAB — BASIC METABOLIC PANEL WITH GFR
Anion gap: 10 (ref 5–15)
BUN: 23 mg/dL — ABNORMAL HIGH (ref 6–20)
CO2: 29 mmol/L (ref 22–32)
Calcium: 8.6 mg/dL — ABNORMAL LOW (ref 8.9–10.3)
Chloride: 99 mmol/L (ref 98–111)
Creatinine, Ser: 1.27 mg/dL — ABNORMAL HIGH (ref 0.61–1.24)
GFR, Estimated: >60 mL/min
Glucose, Bld: 87 mg/dL (ref 70–99)
Potassium: 4 mmol/L (ref 3.5–5.1)
Sodium: 138 mmol/L (ref 135–145)

## 2024-04-11 LAB — CBC
HCT: 26.4 % — ABNORMAL LOW (ref 39.0–52.0)
Hemoglobin: 8.5 g/dL — ABNORMAL LOW (ref 13.0–17.0)
MCH: 22.5 pg — ABNORMAL LOW (ref 26.0–34.0)
MCHC: 32.2 g/dL (ref 30.0–36.0)
MCV: 70 fL — ABNORMAL LOW (ref 80.0–100.0)
Platelets: 330 K/uL (ref 150–400)
RBC: 3.77 MIL/uL — ABNORMAL LOW (ref 4.22–5.81)
RDW: 20.1 % — ABNORMAL HIGH (ref 11.5–15.5)
WBC: 8.5 K/uL (ref 4.0–10.5)
nRBC: 0 % (ref 0.0–0.2)

## 2024-04-11 MED ORDER — SODIUM CHLORIDE 0.9 % IV SOLN: INTRAVENOUS | Status: DC | PRN

## 2024-04-11 MED ORDER — LIDOCAINE 2% (20 MG/ML) 5 ML SYRINGE
INTRAMUSCULAR | Status: DC | PRN
  Administered 2024-04-11: 50 mg via INTRAVENOUS

## 2024-04-11 MED ORDER — PROPOFOL 500 MG/50ML IV EMUL
INTRAVENOUS | Status: DC | PRN
  Administered 2024-04-11: 200 ug/kg/min via INTRAVENOUS

## 2024-04-11 MED ORDER — PROPOFOL 10 MG/ML IV BOLUS
INTRAVENOUS | Status: DC | PRN
  Administered 2024-04-11 (×2): 10 mg via INTRAVENOUS
  Administered 2024-04-11: 20 mg via INTRAVENOUS

## 2024-04-11 MED ORDER — FUROSEMIDE 20 MG PO TABS
40.0000 mg | ORAL_TABLET | Freq: Every day | ORAL | 1 refills | Status: AC
  Filled 2024-04-11: qty 30, 15d supply, fill #0

## 2024-04-11 MED ORDER — ONDANSETRON HCL 4 MG/2ML IJ SOLN
INTRAMUSCULAR | Status: AC
  Filled 2024-04-11: qty 2

## 2024-04-11 MED ORDER — PANTOPRAZOLE SODIUM 40 MG PO TBEC
40.0000 mg | DELAYED_RELEASE_TABLET | Freq: Every day | ORAL | 1 refills | Status: AC
  Filled 2024-04-11: qty 90, 90d supply, fill #0

## 2024-04-11 MED ORDER — POTASSIUM CHLORIDE CRYS ER 20 MEQ PO TBCR
20.0000 meq | EXTENDED_RELEASE_TABLET | Freq: Every day | ORAL | 0 refills | Status: AC
  Filled 2024-04-11: qty 10, 10d supply, fill #0

## 2024-04-11 NOTE — Interval H&P Note (Signed)
 History and Physical Interval Note:  04/11/2024 8:14 AM  Marvin Thomas  has presented today for surgery, with the diagnosis of Melena, anemia, history peptic ulcer disease.  The various methods of treatment have been discussed with the patient and family. After consideration of risks, benefits and other options for treatment, the patient has consented to  Procedures: EGD (ESOPHAGOGASTRODUODENOSCOPY) (N/A) as a surgical intervention.  The patient's history has been reviewed, patient examined, no change in status, stable for surgery.  I have reviewed the patient's chart and labs.  Questions were answered to the patient's satisfaction.     Estefana VEAR Keas

## 2024-04-11 NOTE — Anesthesia Preprocedure Evaluation (Addendum)
 "                                  Anesthesia Evaluation  Patient identified by MRN, date of birth, ID band Patient awake    Reviewed: Allergy  & Precautions, NPO status , Patient's Chart, lab work & pertinent test results  Airway Mallampati: II  TM Distance: >3 FB Neck ROM: Full    Dental no notable dental hx.    Pulmonary COPD, Current Smoker and Patient abstained from smoking.   Pulmonary exam normal        Cardiovascular negative cardio ROS  Rhythm:Regular Rate:Normal     Neuro/Psych negative neurological ROS  negative psych ROS   GI/Hepatic Neg liver ROS, PUD,GERD  Medicated,,  Endo/Other  negative endocrine ROS    Renal/GU negative Renal ROS     Musculoskeletal negative musculoskeletal ROS (+)    Abdominal Normal abdominal exam  (+)   Peds  Hematology  (+) Blood dyscrasia, anemia Lab Results      Component                Value               Date                      WBC                      8.5                 04/11/2024                HGB                      8.5 (L)             04/11/2024                HCT                      26.4 (L)            04/11/2024                MCV                      70.0 (L)            04/11/2024                PLT                      330                 04/11/2024             Lab Results      Component                Value               Date                      NA                       138  04/11/2024                K                        4.0                 04/11/2024                CO2                      29                  04/11/2024                GLUCOSE                  87                  04/11/2024                BUN                      23 (H)              04/11/2024                CREATININE               1.27 (H)            04/11/2024                CALCIUM                   8.6 (L)             04/11/2024                GFRNONAA                 >60                 04/11/2024               Anesthesia Other Findings   Reproductive/Obstetrics                              Anesthesia Physical Anesthesia Plan  ASA: 2  Anesthesia Plan: MAC   Post-op Pain Management:    Induction: Intravenous  PONV Risk Score and Plan: Propofol  infusion and Treatment may vary due to age or medical condition  Airway Management Planned: Simple Face Mask and Nasal Cannula  Additional Equipment: None  Intra-op Plan:   Post-operative Plan:   Informed Consent: I have reviewed the patients History and Physical, chart, labs and discussed the procedure including the risks, benefits and alternatives for the proposed anesthesia with the patient or authorized representative who has indicated his/her understanding and acceptance.     Dental advisory given  Plan Discussed with: CRNA  Anesthesia Plan Comments:         Anesthesia Quick Evaluation  "

## 2024-04-11 NOTE — Plan of Care (Signed)

## 2024-04-11 NOTE — Transfer of Care (Signed)
 Immediate Anesthesia Transfer of Care Note  Patient: Marvin Thomas  Procedure(s) Performed: EGD (ESOPHAGOGASTRODUODENOSCOPY) BIOPSY, SKIN, SUBCUTANEOUS TISSUE, OR MUCOUS MEMBRANE  Patient Location: Endoscopy Unit  Anesthesia Type:MAC  Level of Consciousness: oriented, sedated, and patient cooperative  Airway & Oxygen Therapy: Patient Spontanous Breathing and Patient connected to face mask oxygen  Post-op Assessment: Report given to RN and Post -op Vital signs reviewed and stable  Post vital signs: Reviewed  Last Vitals:  Vitals Value Taken Time  BP 89/57 04/11/24 08:54  Temp    Pulse 69 04/11/24 08:55  Resp 38 04/11/24 08:55  SpO2 100 % 04/11/24 08:55  Vitals shown include unfiled device data.  Last Pain:  Vitals:   04/11/24 0718  TempSrc: Temporal  PainSc: 0-No pain         Complications: No notable events documented.

## 2024-04-11 NOTE — Telephone Encounter (Signed)
 Patient referred to infusion pharmacy team for ambulatory infusion of IV iron .  Insurance - [employer] Site of care - Site of care: [facility] Dx code - D50.0 IV Iron  Therapy - Feraheme 510mg  x 1. Venofer  200mg  IV x 1 completed on 04/10/2024 Infusion appointments - Scheduling team will schedule patient as soon as possible.    Sherry Pennant, PharmD, MPH, BCPS, CPP Clinical Pharmacist

## 2024-04-11 NOTE — Discharge Summary (Signed)
 " Physician Discharge Summary   Patient: Marvin Thomas MRN: [id number] DOB: 16-Jan-1966  Admit date:     04/09/2024  Discharge date: {dischdate:26783}  Discharge Physician: Concepcion Riser   PCP: Lanny Callander, MD   Recommendations at discharge:  {Tip this will not be part of the note when signed- Example include specific recommendations for outpatient follow-up, pending tests to follow-up on. (Optional):26781}  ***  Discharge Diagnoses: Principal Problem:   Symptomatic anemia  Resolved Problems:   * No resolved hospital problems. [facility] Course: No notes on file  Assessment and Plan: No notes have been filed under this hospital service. Service: Hospitalist     {Tip this will not be part of the note when signed Body mass index is 23.63 kg/m. , ,  (Optional):26781}  {(NOTE) Pain control PDMP Statment (Optional):26782} Consultants: *** Procedures performed: ***  Disposition: {Plan; Disposition:26390} Diet recommendation:  Discharge Diet Orders (From admission, onward)     Start     Ordered   04/11/24 0000  Diet general        04/11/24 1054           {Diet_Plan:26776} DISCHARGE MEDICATION: Allergies as of 04/11/2024   No Known Allergies      Medication List     TAKE these medications    acetaminophen  500 MG tablet Commonly known as: TYLENOL  Take 2 tablets (1,000 mg total) by mouth every 6 (six) hours as needed for mild pain (pain score 1-3) or headache.   albuterol  108 (90 Base) MCG/ACT inhaler Commonly known as: VENTOLIN  HFA Inhale 1-2 puffs into the lungs every 6 (six) hours as needed for wheezing or shortness of breath.   Benadryl  Allergy  25 MG tablet Generic drug: diphenhydrAMINE  Take 1-2 tablets (25-50 mg total) by mouth every 6 (six) hours as needed for sleep.   cyanocobalamin  1000 MCG tablet Take 1 tablet (1,000 mcg total) by mouth daily.   furosemide  20 MG tablet Commonly known as: LASIX  Take 2 tablets (40 mg total) by mouth  daily. What changed: how much to take   ipratropium-albuterol  0.5-2.5 (3) MG/3ML Soln Commonly known as: DUONEB Take 3 mLs by nebulization 3 (three) times daily as needed.   mirtazapine  15 MG tablet Commonly known as: Remeron  Take 1 tablet (15 mg total) by mouth at bedtime.   ondansetron  8 MG tablet Commonly known as: ZOFRAN  Take 1 tablet (8 mg total) by mouth every 8 (eight) hours as needed for nausea or vomiting.   pantoprazole  40 MG tablet Commonly known as: PROTONIX  Take 1 tablet (40 mg total) by mouth daily. What changed: Another medication with the same name was removed. Continue taking this medication, and follow the directions you see here.   potassium chloride  SA 20 MEQ tablet Commonly known as: KLOR-CON  M Take 1 tablet (20 mEq total) by mouth daily.        Discharge Exam: Filed Weights   04/09/24 2126  Weight: 72.6 kg   ***  Condition at discharge: {DC Condition:26389}  The results of significant diagnostics from this hospitalization (including imaging, microbiology, ancillary and laboratory) are listed below for reference.   Imaging Studies: VAS US  LOWER EXTREMITY VENOUS (DVT) Result Date: 04/10/2024  Lower Venous DVT Study Patient Name:  Marvin Thomas  Date of Exam:   04/10/2024 Medical Rec #: [id number]          Accession #:    [id number] Date of Birth: [birth year]          Patient Gender: Marvin Thomas  Patient Age:   59 years Exam Location:  [facility] Procedure:      VAS US  LOWER EXTREMITY VENOUS (DVT) Referring Phys: CAMILA NED --------------------------------------------------------------------------------  Indications: Swelling, Edema, metastatic colon cancer, and SOB.  Comparison Study: No prior exam. Performing Technologist: Edilia Elden Appl  Examination Guidelines: A complete evaluation includes B-mode imaging, spectral Doppler, color Doppler, and power Doppler as needed of all accessible portions of each vessel. Bilateral testing is considered an  integral part of a complete examination. Limited examinations for reoccurring indications may be performed as noted. The reflux portion of the exam is performed with the patient in reverse Trendelenburg.  +---------+---------------+---------+-----------+----------+--------------+ RIGHT    CompressibilityPhasicitySpontaneityPropertiesThrombus Aging +---------+---------------+---------+-----------+----------+--------------+ CFV      Full           Yes      Yes                                 +---------+---------------+---------+-----------+----------+--------------+ SFJ      Full           Yes      Yes                                 +---------+---------------+---------+-----------+----------+--------------+ FV Prox  Full                                                        +---------+---------------+---------+-----------+----------+--------------+ FV Mid   Full                                                        +---------+---------------+---------+-----------+----------+--------------+ FV DistalFull                                                        +---------+---------------+---------+-----------+----------+--------------+ PFV      Full                                                        +---------+---------------+---------+-----------+----------+--------------+ POP      Full           Yes      Yes                                 +---------+---------------+---------+-----------+----------+--------------+ PTV      Full                                                        +---------+---------------+---------+-----------+----------+--------------+ PERO  Full                                                        +---------+---------------+---------+-----------+----------+--------------+   +---------+---------------+---------+-----------+----------+--------------+ LEFT     CompressibilityPhasicitySpontaneityPropertiesThrombus  Aging +---------+---------------+---------+-----------+----------+--------------+ CFV      Full           Yes      Yes                                 +---------+---------------+---------+-----------+----------+--------------+ SFJ      Full           Yes      Yes                                 +---------+---------------+---------+-----------+----------+--------------+ FV Prox  Full                                                        +---------+---------------+---------+-----------+----------+--------------+ FV Mid   Full                                                        +---------+---------------+---------+-----------+----------+--------------+ FV DistalFull                                                        +---------+---------------+---------+-----------+----------+--------------+ PFV      Full                                                        +---------+---------------+---------+-----------+----------+--------------+ POP      Full           Yes      Yes                                 +---------+---------------+---------+-----------+----------+--------------+ PTV      Full                                                        +---------+---------------+---------+-----------+----------+--------------+ PERO     Full                                                        +---------+---------------+---------+-----------+----------+--------------+  Cystic structure noted in the left popliteal fossa measuring 4.7 x 1.6 x 1.8 cm.    Summary: RIGHT: - There is no evidence of deep vein thrombosis in the lower extremity.  - No cystic structure found in the popliteal fossa.  LEFT: - There is no evidence of deep vein thrombosis in the lower extremity.  - A cystic structure is found in the popliteal fossa.  *See table(s) above for measurements and observations. Electronically signed by Gaile New MD on 04/10/2024 at 5:00:04 PM.    Final     ECHOCARDIOGRAM COMPLETE Result Date: 04/10/2024    ECHOCARDIOGRAM REPORT   Patient Name:   Marvin Thomas Date of Exam: 04/10/2024 Medical Rec #:  [id number]         Height:       69.0 in Accession #:    [id number]        Weight:       160.0 lb Date of Birth:  1966-03-24         BSA:          1.879 m Patient Age:    58 years          BP:           129/81 mmHg Patient Gender: M                 HR:           62 bpm. Exam Location:  Inpatient Procedure: 2D Echo, Cardiac Doppler and Color Doppler (Both Spectral and Color            Flow Doppler were utilized during procedure). Indications:    CHF I50.21  History:        Patient has no prior history of Echocardiogram examinations.                 COPD.  Sonographer:    Nathanel Devonshire Referring Phys: [id number] SARA-MAIZ A THOMAS IMPRESSIONS  1. Left ventricular ejection fraction, by estimation, is 55 to 60%. The left ventricle has normal function. The left ventricle has no regional wall motion abnormalities. Left ventricular diastolic parameters are consistent with Grade I diastolic dysfunction (impaired relaxation).  2. Right ventricular systolic function is normal. The right ventricular size is normal. Tricuspid regurgitation signal is inadequate for assessing PA pressure.  3. The mitral valve is grossly normal. Trivial mitral valve regurgitation. No evidence of mitral stenosis.  4. The aortic valve is tricuspid. Aortic valve regurgitation is mild. No aortic stenosis is present.  5. Aortic dilatation noted. There is mild dilatation of the aortic root, measuring 40 mm.  6. The inferior vena cava is normal in size with greater than 50% respiratory variability, suggesting right atrial pressure of 3 mmHg. FINDINGS  Left Ventricle: Left ventricular ejection fraction, by estimation, is 55 to 60%. The left ventricle has normal function. The left ventricle has no regional wall motion abnormalities. The left ventricular internal cavity size was normal in size. There is  no left  ventricular hypertrophy. Left ventricular diastolic parameters are consistent with Grade I diastolic dysfunction (impaired relaxation). Right Ventricle: The right ventricular size is normal. No increase in right ventricular wall thickness. Right ventricular systolic function is normal. Tricuspid regurgitation signal is inadequate for assessing PA pressure. Left Atrium: Left atrial size was normal in size. Right Atrium: Right atrial size was normal in size. Pericardium: There is no evidence of pericardial effusion. Mitral Valve: The mitral valve is grossly normal. Trivial mitral valve regurgitation.  No evidence of mitral valve stenosis. Tricuspid Valve: The tricuspid valve is grossly normal. Tricuspid valve regurgitation is not demonstrated. No evidence of tricuspid stenosis. Aortic Valve: The aortic valve is tricuspid. Aortic valve regurgitation is mild. Aortic regurgitation PHT measures 529 msec. No aortic stenosis is present. Aortic valve mean gradient measures 4.0 mmHg. Aortic valve peak gradient measures 8.0 mmHg. Aortic  valve area, by VTI measures 3.07 cm. Pulmonic Valve: The pulmonic valve was grossly normal. Pulmonic valve regurgitation is not visualized. No evidence of pulmonic stenosis. Aorta: Aortic dilatation noted. There is mild dilatation of the aortic root, measuring 40 mm. Venous: The inferior vena cava is normal in size with greater than 50% respiratory variability, suggesting right atrial pressure of 3 mmHg. IAS/Shunts: The atrial septum is grossly normal.  LEFT VENTRICLE PLAX 2D LVIDd:         5.30 cm     Diastology LVIDs:         3.70 cm     LV e' medial:    6.20 cm/s LV PW:         1.20 cm     LV E/e' medial:  9.0 LV IVS:        1.00 cm     LV e' lateral:   9.03 cm/s LVOT diam:     2.10 cm     LV E/e' lateral: 6.2 LV SV:         79 LV SV Index:   42 LVOT Area:     3.46 cm LV IVRT:       102 msec  LV Volumes (MOD) LV vol d, MOD A2C: 90.6 ml LV vol d, MOD A4C: 80.5 ml LV vol s, MOD A2C: 26.8 ml  LV vol s, MOD A4C: 33.8 ml LV SV MOD A2C:     63.8 ml LV SV MOD A4C:     80.5 ml LV SV MOD BP:      55.3 ml RIGHT VENTRICLE          IVC RV Basal diam:  3.00 cm  IVC diam: 1.70 cm TAPSE (M-mode): 1.8 cm                          PULMONARY VEINS                          Diastolic Velocity: 44.10 cm/s                          S/D Velocity:       1.10                          Systolic Velocity:  48.40 cm/s LEFT ATRIUM             Index LA diam:        3.60 cm 1.92 cm/m LA Vol (A2C):   31.9 ml 16.98 ml/m LA Vol (A4C):   40.6 ml 21.61 ml/m LA Biplane Vol: 36.0 ml 19.16 ml/m  AORTIC VALVE                    PULMONIC VALVE AV Area (Vmax):    3.07 cm     PV Vmax:       0.72 m/s AV Area (Vmean):   2.57 cm     PV Peak grad:  2.1 mmHg AV  Area (VTI):     3.07 cm AV Vmax:           141.00 cm/s AV Vmean:          88.300 cm/s AV VTI:            0.258 m AV Peak Grad:      8.0 mmHg AV Mean Grad:      4.0 mmHg LVOT Vmax:         125.00 cm/s LVOT Vmean:        65.400 cm/s LVOT VTI:          0.229 m LVOT/AV VTI ratio: 0.89 AI PHT:            529 msec  AORTA Ao Root diam: 4.00 cm Ao Asc diam:  3.10 cm MITRAL VALVE MV Area (PHT): 4.68 cm    SHUNTS MV E velocity: 56.10 cm/s  Systemic VTI:  0.23 m MV A velocity: 68.60 cm/s  Systemic Diam: 2.10 cm MV E/A ratio:  0.82 Darryle Decent MD Electronically signed by Darryle Decent MD Signature Date/Time: 04/10/2024/2:31:56 PM    Final    CT CHEST ABDOMEN PELVIS W CONTRAST Result Date: 04/10/2024 EXAM: CT CHEST, ABDOMEN AND PELVIS WITH CONTRAST 04/10/2024 01:25:31 AM TECHNIQUE: CT of the chest, abdomen and pelvis was performed with the administration of 100 mL of iohexol  (OMNIPAQUE ) 300 MG/ML solution. Multiplanar reformatted images are provided for review. Automated exposure control, iterative reconstruction, and/or weight based adjustment of the mA/kV was utilized to reduce the radiation dose to as low as reasonably achievable. COMPARISON: Comparison study 02/23/2024, chest x-ray from  earlier in the same day and CT from 05/07/2023. CLINICAL HISTORY: Known metastatic colon carcinoma with increasing shortness of breath and lower extremity swelling. FINDINGS: CHEST: MEDIASTINUM AND LYMPH NODES: Heart: No cardiac enlargement is seen. Coronary calcifications are seen. The thoracic aorta shows mild atherosclerotic calcification. No aneurysmal dilatation or dissection is noted. The pulmonary artery is within normal limits, although not timed for embolic evaluation. Lymphadenopathy: Right paratracheal and pretracheal adenopathy is noted, measuring 5.3 x 4.5 cm in greatest AP and transverse dimensions, respectively. This is new from a prior exam in February of 2005 and corresponds to that seen on a recent chest x-ray. No hilar adenopathy is seen. The hilar fullness seen on the prior plain film is related to vascularity. No axillary lymphadenopathy. Other: Right chest wall port is noted. The thoracic inlet is within normal limits. LUNGS AND PLEURA: Lungs: The lungs are well aerated bilaterally. Diffuse emphysematous changes are seen. A stable soft tissue lesion is noted in the posterior medial right upper lobe with central calcification. It measures up to 2.2 cm and is roughly stable from the prior exam. There is a 7 mm nodule along bullous changes in the medial aspect of the right upper lobe, best seen on image number 105 of series 4. This is also new from the prior CT in February of 2025 and consistent with metastatic disease. . Pleura: No pleural effusion. No pneumothorax. Bones: No acute bony abnormality is noted. ABDOMEN AND PELVIS: LIVER: The liver again demonstrates significant metastatic disease occupying almost the entire right lobe. Scattered smaller lesions are noted within the left lobe of the liver. GALLBLADDER AND BILE DUCTS: The gallbladder is decompressed. No biliary ductal dilatation. SPLEEN: No acute abnormality. PANCREAS: No acute abnormality. ADRENAL GLANDS: No acute abnormality.  KIDNEYS, URETERS AND BLADDER: No renal calculi or obstructive changes are seen. Delayed images of the kidneys demonstrate patchy abnormal enhancement.  This may represent a degree of pyelonephritis. Correlation with laboratory values is recommended. The bladder is partially distended. No perinephric or periureteral stranding. GI AND BOWEL: Stomach and small bowel appear within normal limits. In the right colon and cecum, there is a soft tissue enhancing mass lesion consistent with a known history of colon carcinoma. This is best seen on image number 99 of series 2. No obstructive or inflammatory changes of the colon are seen. There is no bowel obstruction. REPRODUCTIVE ORGANS: The prostate is within normal limits. PERITONEUM AND RETROPERITONEUM: Mild free fluid is noted within the pelvis stable from the prior exam. No free air. VASCULATURE: The aorta demonstrates atherosclerotic calcification without aneurysmal dilatation. ABDOMINAL AND PELVIS LYMPH NODES: No lymphadenopathy. BONES AND SOFT TISSUES: Degenerative changes of the lumbar spine are noted. No acute osseous abnormality. No focal soft tissue abnormality. IMPRESSION: 1. New right paratracheal and pretracheal adenopathy measuring 5.3 x 4.5 cm, corresponding to recent chest x-ray, consistent with progressive metastatic disease. 2. New 7 mm nodule along bullous changes in the medial aspect of the right upper lobe, suspicious for metastatic disease. 3. Significant metastatic disease in the liver, occupying almost the entire right lobe, with scattered smaller lesions in the left lobe. 4. Soft tissue enhancing mass lesion in the right colon and cecum, consistent with known colon carcinoma. 5. Patchy abnormal enhancement in the kidneys, possibly representing pyelonephritis; recommend correlation with laboratory values. Electronically signed by: Oneil Devonshire MD 04/10/2024 01:39 AM EST RP Workstation: [id number]   DG Chest Portable 1 View Result Date:  04/10/2024 EXAM: 1 VIEW(S) XRAY OF THE CHEST 04/10/2024 12:30:00 AM COMPARISON: 11/09/2023 CLINICAL HISTORY: peripheral edema FINDINGS: LINES, TUBES AND DEVICES: Right chest Port-A-Cath in place with tip at the superior cavoatrial junction. LUNGS AND PLEURA: No focal pulmonary opacity. No pleural effusion. No pneumothorax. HEART AND MEDIASTINUM: Increased prominence of the right paratracheal and AP window region corresponding to lymphadenopathy. No acute abnormality of the cardiac silhouette. BONES AND SOFT TISSUES: No acute osseous abnormality. IMPRESSION: 1. Increased prominence of the right paratracheal and AP window region corresponding to lymphadenopathy. This is consistent with the patient's given clinical history of metastatic colon carcinoma Electronically signed by: Oneil Devonshire MD 04/10/2024 12:36 AM EST RP Workstation: [id number]    Microbiology: Results for orders placed or performed during the hospital encounter of 10/16/22  Urine Culture (for pregnant, neutropenic or urologic patients or patients with an indwelling urinary catheter)     Status: Abnormal   Collection Time: 10/16/22  8:43 PM   Specimen: Urine, Clean Catch  Result Value Ref Range Status   Specimen Description   Final    URINE, CLEAN CATCH Performed at [facility], [address] [place], [place] [address]    Special Requests   Final    NONE Performed at [facility], [address], [place], [place] [address]    Culture MULTIPLE SPECIES PRESENT, SUGGEST RECOLLECTION (A)  Final   Report Status 10/18/2022 FINAL  Final    Labs: CBC: Recent Labs  Lab 04/09/24 2242 04/10/24 0221 04/10/24 0511 04/10/24 1456 04/10/24 1841 04/11/24 0538  WBC 9.2 8.4  --   --   --  8.5  NEUTROABS 7.4  --   --   --   --   --   HGB 6.1* 6.2* 7.5* 8.2* 9.0* 8.5*  HCT 19.4* 19.4* 23.3* 25.0* 27.1* 26.4*  MCV 67.8* 68.3*  --   --   --  70.0*  PLT 323 313  --   --   --  330   Basic Metabolic  Panel: Recent Labs  Lab 04/09/24 2242 04/10/24 0221 04/11/24 0538  NA 134* 132* 138  K 3.1* 3.0* 4.0  CL 98 97* 99  CO2 22 24 29   GLUCOSE 91 84 87  BUN 22* 21* 23*  CREATININE 1.17 1.20 1.27*  CALCIUM  8.5* 8.3* 8.6*   Liver Function Tests: Recent Labs  Lab 04/09/24 2242 04/10/24 0221  AST 61* 57*  ALT 7 <5  ALKPHOS 193* 188*  BILITOT 0.7 0.7  PROT 7.4 6.8  ALBUMIN  3.0* 2.7*   CBG: No results for input(s): GLUCAP in the last 168 hours.  Discharge time spent: {LESS THAN/GREATER UYJW:73611} 30 minutes.  Signed: Concepcion Riser, MD [facility] 04/11/2024 "

## 2024-04-11 NOTE — Telephone Encounter (Signed)
 Auth Submission: NO AUTH NEEDED Site of care: Site of care: [facility] Payer: [employer] Medication & CPT/J Code(s) submitted: Feraheme (ferumoxytol) [id number] Diagnosis Code: D50.0 Route of submission (phone, fax, portal):  Phone # Fax # Auth type: Buy/Bill HB Units/visits requested: 510mg  x 1 dose Reference number:  Approval from: 04/11/24 to 07/10/24

## 2024-04-11 NOTE — Op Note (Signed)
 [facility] Patient Name: Marvin Thomas Procedure Date: 04/11/2024 MRN: [id number] Attending MD: Estefana Keas DO, DO, [id number] Date of Birth: [birth year] CSN: [id number] Age: 59 Admit Type: Inpatient Procedure:                Upper GI endoscopy Indications:              Iron  deficiency anemia due to suspected upper                            gastrointestinal bleeding, Melena Providers:                Estefana Keas DO, DO, Gregoria Pierce, RN,                            Coye Bade, Technician Referring MD:              Medicines:                See the Anesthesia note for documentation of the                            administered medications Complications:            No immediate complications. Estimated Blood Loss:     Estimated blood loss was minimal. Procedure:                Pre-Anesthesia Assessment:                           - ASA Grade Assessment: II - A patient with mild                            systemic disease.                           - The risks and benefits of the procedure and the                            sedation options and risks were discussed with the                            patient. All questions were answered and informed                            consent was obtained.                           After obtaining informed consent, the endoscope was                            passed under direct vision. Throughout the                            procedure, the patient's blood pressure, pulse, and                            oxygen saturations were monitored continuously. The  [id number]-H190 ([id number]) Olympus endoscope was introduced                            through the mouth, and advanced to the second part                            of duodenum. The upper GI endoscopy was                            accomplished without difficulty. The patient                            tolerated the procedure well. Scope  In: Scope Out: Findings:      The examined esophagus was normal.      Scattered mild inflammation characterized by erythema was found in the       gastric body and in the gastric antrum. Biopsies were taken with a cold       forceps for Helicobacter pylori testing.      Two non-bleeding superficial gastric ulcers with a clean ulcer base       (Forrest Class III) were found in the gastric body and on the lesser       curvature of the gastric antrum. The largest lesion was 4 mm in largest       dimension.      The in the duodenum was normal. Impression:               - Normal esophagus.                           - Gastritis. Biopsied.                           - Non-bleeding gastric ulcers with a clean ulcer                            base (Forrest Class III).                           - Normal. Moderate Sedation:      Monitored anesthesia care provided by anesthesia department. Recommendation:           - Return patient to hospital ward for ongoing care.                           - Resume regular diet.                           - Continue present medications.                           - Await pathology results.                           - Suspect melena from healing gastric ulcers versus                            bleeding from right colon  mass.                           - Continue PPI therapy indefinitely.                           - [facility] will sign off and be available as needed. Procedure Code(s):        --- Professional ---                           [id number], Esophagogastroduodenoscopy, flexible,                            transoral; with biopsy, single or multiple Diagnosis Code(s):        --- Professional ---                           K29.70, Gastritis, unspecified, without bleeding                           K25.9, Gastric ulcer, unspecified as acute or                            chronic, without hemorrhage or perforation                           D50.9, Iron  deficiency anemia,  unspecified                           K92.1, Melena (includes Hematochezia) CPT copyright 2022 [employer]. All rights reserved. The codes documented in this report are preliminary and upon coder review may  be revised to meet current compliance requirements. Dr Estefana Keas, DO Estefana Keas DO, DO 04/11/2024 8:59:34 AM Number of Addenda: 0

## 2024-04-12 NOTE — Anesthesia Postprocedure Evaluation (Signed)
"   Anesthesia Post Note  Patient: Marvin Thomas  Procedure(s) Performed: EGD (ESOPHAGOGASTRODUODENOSCOPY) BIOPSY, SKIN, SUBCUTANEOUS TISSUE, OR MUCOUS MEMBRANE     Patient location during evaluation: PACU Anesthesia Type: MAC Level of consciousness: awake and alert Pain management: pain level controlled Vital Signs Assessment: post-procedure vital signs reviewed and stable Respiratory status: spontaneous breathing, nonlabored ventilation, respiratory function stable and patient connected to nasal cannula oxygen Cardiovascular status: stable and blood pressure returned to baseline Postop Assessment: no apparent nausea or vomiting Anesthetic complications: no   No notable events documented.  Last Vitals:  Vitals:   04/11/24 0910 04/11/24 0920  BP: (!) 101/59 (!) 99/45  Pulse: 66 62  Resp: (!) 29 (!) 29  Temp:    SpO2: 95% 96%    Last Pain:  Vitals:   04/11/24 0920  TempSrc:   PainSc: 0-No pain                 Cordella SQUIBB Leylani Duley      "

## 2024-04-13 ENCOUNTER — Other Ambulatory Visit (HOSPITAL_COMMUNITY): Payer: Self-pay

## 2024-04-13 ENCOUNTER — Encounter (HOSPITAL_COMMUNITY): Payer: Self-pay | Admitting: Internal Medicine

## 2024-04-13 LAB — SURGICAL PATHOLOGY

## 2024-04-13 NOTE — Telephone Encounter (Signed)
 Copied from CRM #[id number]. Topic: General - Other >> Apr 11, 2024  4:36 PM Rilla B wrote: Reason for CRM: Patient stating he just got out of hospital and was told to go to [address] for infusion. Patient states he went there to our office and was told we should have sent orders/referral with him (??) Patient a little upset. Please call patient @ [phone].   ----------------------------------------------------------------------------------------------------------------------------------------------  Forwarding to infusion, this is not a pulmonary patient.

## 2024-04-13 NOTE — Telephone Encounter (Signed)
 The appointment says [facility].  I don't even think this is for our infusion center.  Can you figure out where this needs to go please?  Thank you.

## 2024-04-15 ENCOUNTER — Ambulatory Visit (HOSPITAL_COMMUNITY)
Admission: RE | Admit: 2024-04-15 | Discharge: 2024-04-15 | Disposition: A | Discharge status: Home / Self Care | Source: Ambulatory Visit | Attending: Internal Medicine | Admitting: Internal Medicine

## 2024-04-15 VITALS — BP 125/63 | HR 70 | Temp 97.8°F | Resp 18

## 2024-04-15 DIAGNOSIS — D500 Iron deficiency anemia secondary to blood loss (chronic): Secondary | ICD-10-CM | POA: Insufficient documentation

## 2024-04-15 MED ORDER — SODIUM CHLORIDE 0.9 % IV SOLN
510.0000 mg | Freq: Once | INTRAVENOUS | Status: AC
  Administered 2024-04-15: 510 mg via INTRAVENOUS
  Filled 2024-04-15: qty 17

## 2024-04-15 MED ORDER — SODIUM CHLORIDE 0.9 % IV SOLN: INTRAVENOUS | Status: DC | PRN

## 2024-04-15 NOTE — Progress Notes (Signed)
 Diagnosis: iron  deficiency anemia    Provider:Sreeram, Nasendranath    Procedure: feraheme 510 mg     Note: Patient received Feraheme 510 mg ( 1 of 1)via PIV,  patient tolerated well with no adverse reaction.  Patient's vital signs remained stable and within normal range.  Patient was offered and received the AVS with highlighted side effects and verbally given discharge instructions and he verbalized understanding.

## 2024-04-19 ENCOUNTER — Encounter (HOSPITAL_COMMUNITY): Payer: Self-pay | Admitting: Internal Medicine
